# Patient Record
Sex: Female | Born: 1975 | Race: White | Hispanic: No | Smoking: Current every day smoker
Health system: Southern US, Community
[De-identification: ages and names within clinical notes are randomized; demographics above are authoritative.]

## PROBLEM LIST (undated history)

## (undated) DIAGNOSIS — M869 Osteomyelitis, unspecified: Secondary | ICD-10-CM

## (undated) DIAGNOSIS — G8929 Other chronic pain: Secondary | ICD-10-CM

## (undated) DIAGNOSIS — F32A Depression, unspecified: Secondary | ICD-10-CM

## (undated) DIAGNOSIS — F419 Anxiety disorder, unspecified: Secondary | ICD-10-CM

## (undated) DIAGNOSIS — F319 Bipolar disorder, unspecified: Secondary | ICD-10-CM

## (undated) DIAGNOSIS — J45909 Unspecified asthma, uncomplicated: Secondary | ICD-10-CM

## (undated) DIAGNOSIS — G5791 Unspecified mononeuropathy of right lower limb: Secondary | ICD-10-CM

## (undated) DIAGNOSIS — A4902 Methicillin resistant Staphylococcus aureus infection, unspecified site: Secondary | ICD-10-CM

## (undated) DIAGNOSIS — F329 Major depressive disorder, single episode, unspecified: Secondary | ICD-10-CM

## (undated) DIAGNOSIS — G43909 Migraine, unspecified, not intractable, without status migrainosus: Secondary | ICD-10-CM

## (undated) DIAGNOSIS — B999 Unspecified infectious disease: Secondary | ICD-10-CM

## (undated) DIAGNOSIS — M549 Dorsalgia, unspecified: Secondary | ICD-10-CM

## (undated) DIAGNOSIS — Z86718 Personal history of other venous thrombosis and embolism: Secondary | ICD-10-CM

## (undated) DIAGNOSIS — D649 Anemia, unspecified: Secondary | ICD-10-CM

## (undated) DIAGNOSIS — M1990 Unspecified osteoarthritis, unspecified site: Secondary | ICD-10-CM

## (undated) DIAGNOSIS — K219 Gastro-esophageal reflux disease without esophagitis: Secondary | ICD-10-CM

## (undated) HISTORY — PX: ANKLE FRACTURE SURGERY: SHX122

## (undated) HISTORY — PX: DILATION AND CURETTAGE OF UTERUS: SHX78

## (undated) HISTORY — PX: FRACTURE SURGERY: SHX138

## (undated) HISTORY — PX: TUBAL LIGATION: SHX77

---

## 1998-09-08 ENCOUNTER — Inpatient Hospital Stay (HOSPITAL_COMMUNITY): Admission: AD | Admit: 1998-09-08 | Discharge: 1998-09-08 | Payer: Self-pay | Admitting: Obstetrics & Gynecology

## 1998-09-08 ENCOUNTER — Encounter (INDEPENDENT_AMBULATORY_CARE_PROVIDER_SITE_OTHER): Payer: Self-pay

## 1998-10-10 ENCOUNTER — Encounter: Payer: Self-pay | Admitting: Emergency Medicine

## 1998-10-10 ENCOUNTER — Inpatient Hospital Stay (HOSPITAL_COMMUNITY): Admission: AD | Admit: 1998-10-10 | Discharge: 1998-10-12 | Payer: Self-pay | Admitting: Obstetrics & Gynecology

## 1998-10-10 ENCOUNTER — Emergency Department (HOSPITAL_COMMUNITY): Admission: EM | Admit: 1998-10-10 | Discharge: 1998-10-10 | Payer: Self-pay | Admitting: Emergency Medicine

## 1998-11-17 ENCOUNTER — Inpatient Hospital Stay (HOSPITAL_COMMUNITY): Admission: AD | Admit: 1998-11-17 | Discharge: 1998-11-17 | Payer: Self-pay | Admitting: Obstetrics & Gynecology

## 1998-11-17 ENCOUNTER — Encounter: Payer: Self-pay | Admitting: Obstetrics & Gynecology

## 1998-12-01 ENCOUNTER — Inpatient Hospital Stay (HOSPITAL_COMMUNITY): Admission: AD | Admit: 1998-12-01 | Discharge: 1998-12-01 | Payer: Self-pay | Admitting: Obstetrics and Gynecology

## 1998-12-02 ENCOUNTER — Observation Stay (HOSPITAL_COMMUNITY): Admission: AD | Admit: 1998-12-02 | Discharge: 1998-12-03 | Payer: Self-pay | Admitting: Obstetrics

## 1998-12-03 ENCOUNTER — Encounter: Payer: Self-pay | Admitting: Obstetrics

## 1998-12-10 ENCOUNTER — Encounter (INDEPENDENT_AMBULATORY_CARE_PROVIDER_SITE_OTHER): Payer: Self-pay

## 1998-12-10 ENCOUNTER — Ambulatory Visit (HOSPITAL_COMMUNITY): Admission: AD | Admit: 1998-12-10 | Discharge: 1998-12-10 | Payer: Self-pay | Admitting: Obstetrics & Gynecology

## 1999-05-29 ENCOUNTER — Encounter: Payer: Self-pay | Admitting: Emergency Medicine

## 1999-05-29 ENCOUNTER — Emergency Department (HOSPITAL_COMMUNITY): Admission: EM | Admit: 1999-05-29 | Discharge: 1999-05-29 | Payer: Self-pay | Admitting: Emergency Medicine

## 2002-01-20 ENCOUNTER — Emergency Department (HOSPITAL_COMMUNITY): Admission: EM | Admit: 2002-01-20 | Discharge: 2002-01-20 | Payer: Self-pay | Admitting: Emergency Medicine

## 2002-01-20 ENCOUNTER — Encounter: Payer: Self-pay | Admitting: Emergency Medicine

## 2002-03-19 ENCOUNTER — Inpatient Hospital Stay (HOSPITAL_COMMUNITY): Admission: AD | Admit: 2002-03-19 | Discharge: 2002-03-19 | Payer: Self-pay | Admitting: *Deleted

## 2002-05-05 ENCOUNTER — Inpatient Hospital Stay (HOSPITAL_COMMUNITY): Admission: AD | Admit: 2002-05-05 | Discharge: 2002-05-05 | Payer: Self-pay | Admitting: *Deleted

## 2002-05-05 ENCOUNTER — Encounter: Payer: Self-pay | Admitting: *Deleted

## 2002-05-06 ENCOUNTER — Inpatient Hospital Stay (HOSPITAL_COMMUNITY): Admission: AD | Admit: 2002-05-06 | Discharge: 2002-05-06 | Payer: Self-pay | Admitting: Obstetrics and Gynecology

## 2002-05-10 ENCOUNTER — Inpatient Hospital Stay (HOSPITAL_COMMUNITY): Admission: AD | Admit: 2002-05-10 | Discharge: 2002-05-10 | Payer: Self-pay | Admitting: Obstetrics and Gynecology

## 2002-05-12 ENCOUNTER — Encounter: Admission: RE | Admit: 2002-05-12 | Discharge: 2002-05-12 | Payer: Self-pay | Admitting: *Deleted

## 2002-05-13 ENCOUNTER — Inpatient Hospital Stay (HOSPITAL_COMMUNITY): Admission: AD | Admit: 2002-05-13 | Discharge: 2002-05-15 | Payer: Self-pay | Admitting: Obstetrics and Gynecology

## 2006-06-08 ENCOUNTER — Inpatient Hospital Stay (HOSPITAL_COMMUNITY): Admission: EM | Admit: 2006-06-08 | Discharge: 2006-06-11 | Payer: Self-pay | Admitting: Internal Medicine

## 2006-11-03 ENCOUNTER — Emergency Department (HOSPITAL_COMMUNITY): Admission: EM | Admit: 2006-11-03 | Discharge: 2006-11-03 | Payer: Self-pay | Admitting: Emergency Medicine

## 2007-02-04 ENCOUNTER — Emergency Department (HOSPITAL_COMMUNITY): Admission: EM | Admit: 2007-02-04 | Discharge: 2007-02-04 | Payer: Self-pay | Admitting: Emergency Medicine

## 2007-08-14 ENCOUNTER — Inpatient Hospital Stay (HOSPITAL_COMMUNITY): Admission: EM | Admit: 2007-08-14 | Discharge: 2007-08-16 | Payer: Self-pay | Admitting: Emergency Medicine

## 2007-09-12 ENCOUNTER — Emergency Department (HOSPITAL_COMMUNITY): Admission: EM | Admit: 2007-09-12 | Discharge: 2007-09-12 | Payer: Self-pay | Admitting: Emergency Medicine

## 2007-12-10 ENCOUNTER — Inpatient Hospital Stay (HOSPITAL_COMMUNITY): Admission: AC | Admit: 2007-12-10 | Discharge: 2007-12-11 | Payer: Self-pay

## 2009-03-22 ENCOUNTER — Emergency Department (HOSPITAL_COMMUNITY): Admission: EM | Admit: 2009-03-22 | Discharge: 2009-03-22 | Payer: Self-pay | Admitting: Emergency Medicine

## 2009-03-24 ENCOUNTER — Emergency Department (HOSPITAL_COMMUNITY): Admission: EM | Admit: 2009-03-24 | Discharge: 2009-03-25 | Payer: Self-pay | Admitting: Emergency Medicine

## 2009-04-30 ENCOUNTER — Emergency Department (HOSPITAL_COMMUNITY): Admission: EM | Admit: 2009-04-30 | Discharge: 2009-04-30 | Payer: Self-pay | Admitting: Emergency Medicine

## 2009-08-09 ENCOUNTER — Emergency Department (HOSPITAL_COMMUNITY): Admission: EM | Admit: 2009-08-09 | Discharge: 2009-08-10 | Payer: Self-pay | Admitting: Emergency Medicine

## 2009-12-23 ENCOUNTER — Emergency Department (HOSPITAL_COMMUNITY): Admission: EM | Admit: 2009-12-23 | Discharge: 2009-12-24 | Payer: Self-pay | Admitting: Emergency Medicine

## 2010-05-10 LAB — CBC
HCT: 39.4 % (ref 36.0–46.0)
Hemoglobin: 12.5 g/dL (ref 12.0–15.0)
MCH: 24.7 pg — ABNORMAL LOW (ref 26.0–34.0)
MCV: 77.7 fL — ABNORMAL LOW (ref 78.0–100.0)
RBC: 5.07 MIL/uL (ref 3.87–5.11)
WBC: 19.1 10*3/uL — ABNORMAL HIGH (ref 4.0–10.5)

## 2010-05-10 LAB — DIFFERENTIAL
Basophils Absolute: 0 10*3/uL (ref 0.0–0.1)
Basophils Relative: 0 % (ref 0–1)
Monocytes Absolute: 1 10*3/uL (ref 0.1–1.0)
Neutro Abs: 16.6 10*3/uL — ABNORMAL HIGH (ref 1.7–7.7)
Neutrophils Relative %: 87 % — ABNORMAL HIGH (ref 43–77)

## 2010-05-10 LAB — COMPREHENSIVE METABOLIC PANEL
Albumin: 4.4 g/dL (ref 3.5–5.2)
Alkaline Phosphatase: 49 U/L (ref 39–117)
BUN: 9 mg/dL (ref 6–23)
CO2: 19 mEq/L (ref 19–32)
Chloride: 110 mEq/L (ref 96–112)
GFR calc non Af Amer: >60 mL/min (ref >60)
Glucose, Bld: 101 mg/dL — ABNORMAL HIGH (ref 70–99)
Potassium: 4.3 mEq/L (ref 3.5–5.1)
Total Bilirubin: 0.6 mg/dL (ref 0.3–1.2)

## 2010-05-10 LAB — URINALYSIS, ROUTINE W REFLEX MICROSCOPIC
Hgb urine dipstick: NEGATIVE
Protein, ur: NEGATIVE mg/dL
Urobilinogen, UA: 0.2 mg/dL (ref 0.0–1.0)

## 2010-05-10 LAB — SALICYLATE LEVEL: Salicylate Lvl: <4 mg/dL (ref 2.8–20.0)

## 2010-05-10 LAB — RAPID URINE DRUG SCREEN, HOSP PERFORMED
Amphetamines: NOT DETECTED
Barbiturates: NOT DETECTED
Tetrahydrocannabinol: NOT DETECTED

## 2010-05-10 LAB — POCT PREGNANCY, URINE: Preg Test, Ur: NEGATIVE

## 2010-05-14 LAB — POCT I-STAT, CHEM 8
Calcium, Ion: 1.16 mmol/L (ref 1.12–1.32)
Chloride: 105 mEq/L (ref 96–112)
HCT: 41 % (ref 36.0–46.0)
TCO2: 29 mmol/L (ref 0–100)

## 2010-05-14 LAB — ETHANOL: Alcohol, Ethyl (B): <5 mg/dL (ref 0–10)

## 2010-05-14 LAB — RAPID URINE DRUG SCREEN, HOSP PERFORMED
Opiates: NOT DETECTED
Tetrahydrocannabinol: POSITIVE — AB

## 2010-05-15 LAB — CBC
HCT: 36.1 % (ref 36.0–46.0)
Hemoglobin: 11.8 g/dL — ABNORMAL LOW (ref 12.0–15.0)
RBC: 4.39 MIL/uL (ref 3.87–5.11)
RDW: 17.5 % — ABNORMAL HIGH (ref 11.5–15.5)

## 2010-05-15 LAB — URINALYSIS, ROUTINE W REFLEX MICROSCOPIC
Glucose, UA: NEGATIVE mg/dL
Ketones, ur: 15 mg/dL — AB
Nitrite: POSITIVE — AB
Protein, ur: 100 mg/dL — AB
Specific Gravity, Urine: 1.017 (ref 1.005–1.030)
Urobilinogen, UA: >8 mg/dL — ABNORMAL HIGH (ref 0.0–1.0)
pH: 6 (ref 5.0–8.0)

## 2010-05-15 LAB — POCT PREGNANCY, URINE: Preg Test, Ur: NEGATIVE

## 2010-05-15 LAB — COMPREHENSIVE METABOLIC PANEL WITH GFR
ALT: 21 U/L (ref 0–35)
AST: 28 U/L (ref 0–37)
Albumin: 3.7 g/dL (ref 3.5–5.2)
Alkaline Phosphatase: 76 U/L (ref 39–117)
BUN: 7 mg/dL (ref 6–23)
CO2: 28 meq/L (ref 19–32)
Calcium: 8.9 mg/dL (ref 8.4–10.5)
Chloride: 100 meq/L (ref 96–112)
Creatinine, Ser: 0.92 mg/dL (ref 0.4–1.2)
GFR calc non Af Amer: >60 mL/min
Glucose, Bld: 90 mg/dL (ref 70–99)
Potassium: 3.7 meq/L (ref 3.5–5.1)
Sodium: 137 meq/L (ref 135–145)
Total Bilirubin: 0.8 mg/dL (ref 0.3–1.2)
Total Protein: 7.7 g/dL (ref 6.0–8.3)

## 2010-05-15 LAB — URINE MICROSCOPIC-ADD ON

## 2010-05-15 LAB — DIFFERENTIAL
Basophils Absolute: 0 10*3/uL (ref 0.0–0.1)
Basophils Relative: 1 % (ref 0–1)
Eosinophils Absolute: 0 10*3/uL (ref 0.0–0.7)
Neutro Abs: 4.1 10*3/uL (ref 1.7–7.7)
Neutrophils Relative %: 70 % (ref 43–77)

## 2010-05-15 LAB — LIPASE, BLOOD: Lipase: 18 U/L (ref 11–59)

## 2010-06-15 ENCOUNTER — Emergency Department (HOSPITAL_COMMUNITY)
Admission: EM | Admit: 2010-06-15 | Discharge: 2010-06-15 | Discharge status: Against Medical Advice | Payer: Self-pay | Attending: Emergency Medicine | Admitting: Emergency Medicine

## 2010-06-15 DIAGNOSIS — R221 Localized swelling, mass and lump, neck: Secondary | ICD-10-CM | POA: Insufficient documentation

## 2010-06-15 DIAGNOSIS — R220 Localized swelling, mass and lump, head: Secondary | ICD-10-CM | POA: Insufficient documentation

## 2010-06-15 DIAGNOSIS — H9209 Otalgia, unspecified ear: Secondary | ICD-10-CM | POA: Insufficient documentation

## 2010-06-15 DIAGNOSIS — K089 Disorder of teeth and supporting structures, unspecified: Secondary | ICD-10-CM | POA: Insufficient documentation

## 2010-07-11 NOTE — Discharge Summary (Signed)
NAMECELE, MOTE NO.:  [id number]   MEDICAL RECORD NO.:  [id number]          PATIENT TYPE:  INP   LOCATION:  5531                         FACILITY:  [facility]   PHYSICIAN:  Isidor Holts, M.D.  DATE OF BIRTH:  07-Jan-1976   DATE OF ADMISSION:  12/09/2007  DATE OF DISCHARGE:  12/11/2007                               DISCHARGE SUMMARY   PMD:  Unassigned.   DISCHARGE DIAGNOSES:  1. Altered mental status secondary to polysubstance abuse.  2. Acute alcoholic intoxication.  3. Polysubstance abuse, i.e., marijuana, cocaine, Klonopin.   DISCHARGE MEDICATIONS:  None.   PROCEDURES.:  1. Chest x-ray dated December 09, 2007, this showed no acute      cardiopulmonary disease.  2. Head CT scan dated December 10, 2007, this showed chronic paranasal      sinusitis.  No acute intracranial findings.   CONSULTATIONS:  None.   ADMISSION HISTORY:  As in H and P notes of October 14,2 009, dictated by  Dr. Midge Minium.  However, in brief, this is a 35 year old female,  with history of anxiety/panic disorder, brought to the emergency  department at [facility] after being found acutely intoxicated  with altered mental status, on the road.  On initial evaluation in the  emergency department, she was found to be somnolent, and agitated when  roused.  She was therefore admitted for further evaluation,  investigation, and management.   CLINICAL COURSE:  1. Altered mental status.  This was secondary to polysubstance abuse.      Patient on detailed questioning when fully alert and communicative      in the a.m. of December 11, 2007, admits to having taken some      tablets of Klonopin which she says she obtained from an      acquaintance, went to shoot some pool, did some crack cocaine, and      drank a lot of alcohol.  The next thing she knew, she was at the      hospital.  Urine drug screen was positive for cocaine,      benzodiazepines, and marijuana.  Alcohol level  was 11.  Chest x-ray      was unremarkable for acute disease.  Head CT scan was unremarkable      for acute intracranial pathology, although it showed sinus disease.      Patient was initially placed in the intensive care unit, managed      with supportive care including intravenous fluid hydration, vitamin      supplementation, telemetric monitoring, oxygen supplementation, and      intermittent doses of Ativan for agitation.  Initially, she did      require restraints, however, by p.m. of December 10, 2007, she was      calm, alert, eating.  There were no further issues.  Although she      had been placed on one-on-one sitter at the time of presentation,      it soon became clear after detailed discussion with her, that she      had no suicidal ideation.  Psychiatric input was therefore deemed  unnecessary.   1. Polysubstance abuse.  As mentioned above, patient's urine drug      screen was positive for marijuana, cocaine, and benzodiazepine.      Patient has been counseled appropriately.   1. Acute alcoholic intoxication.  This had resolved by a.m. of December 11, 2007.   DISPOSITION:  The patient was on December 11, 2007, considered clinically  stable for discharge.  She was therefore discharged accordingly.   DIET:  No restrictions.   ACTIVITY:  No restrictions.   FOLLOWUP INSTRUCTIONS:  Patient is to follow up routinely with her  primary M.D.   Kendell Bane MEDICATIONS:  None.      Isidor Holts, M.D.  Electronically Signed     CO/MEDQ  D:  12/11/2007  T:  12/11/2007  Job:  [id number]

## 2010-07-11 NOTE — Op Note (Signed)
Monique [name], Monique [name] NO.:  [id number]   MEDICAL RECORD NO.:  [id number]          PATIENT TYPE:  INP   LOCATION:  1531                         FACILITY:  [facility]   PHYSICIAN:  Dionne Ano. Gramig III, M.D.DATE OF BIRTH:  [birth year]   DATE OF PROCEDURE:  08/14/2007  DATE OF DISCHARGE:                               OPERATIVE REPORT   PREOPERATIVE DIAGNOSIS:  Deep abscess right upper extremity with  ascending cellulitis, lymphangitis.   POSTOPERATIVE DIAGNOSIS:  Deep abscess right upper extremity with  ascending cellulitis, lymphangitis.   PROCEDURE:  I&D deep abscess right hand and index finger.   SURGEON:  Dominica Severin, M.D.   ASSISTANT:  None.   COMPLICATIONS:  None.   ANESTHESIA:  General.   DRAINS:  Two separate drains at 2 separate incision sites.   ESTIMATED BLOOD LOSS:  Minimal.   INDICATIONS FOR PROCEDURE:  This patient is 35 year old female who  presents with the mentioned diagnosis.  I have counseled her in regards  to risk and benefits of surgery including risk of infection, bleeding  anesthesia, damage to normal structures and failure of surgery to  accomplish its intended goals of relieving symptoms and restoring  function.  With this in mind, she desires to proceed.  All questions  have been encouraged and answered preoperatively.   OPERATIVE PROCEDURE:  The patient was seen on myself and anesthesia,  taken to the operative suite, and underwent smooth induction of general  anesthesia.  A time out was called, and the patient was thoroughly  counseled, consented and permitted.  Under general anesthetic, the  patient was prepped and draped.  Following this, incision was made about  the index finger.  A large amount of deep purulence was removed and  evacuated.  A second incision about the PIP region distally on the  dorsal aspect of finger was also made.  Counterincision and through-and-  through irrigation was then set up.  The  through-and-through irrigation  was accomplished without difficulty utilizing 3 liters of saline.  Following this, the wound was aggressively debrided of any nonviable  tissue and packed with Iodoform gauze.   The patient had aerobic and anaerobic cultures taken.  There no  complicating features with her surgery, and all sponge, needle and  instrument counts were reported as correct.  She was dressed with  Neosporin gauze, Kerlix, Webril and Ace wrap.   She will be admitted for IV antibiotics in form of vancomycin and Zosyn.  I would plan for daily therapy for whirlpool/water pick, so that we can  try and get her wound and parameters back to a quiescent state of  affairs.  I have discussed relevant issues with the patient, and she is  thoroughly aware of the infection and our plans for admission,  elevation, IV antibiotics, pain management, etc.      Dionne Ano. Everlene Other, M.D.     [name]  D:  08/14/2007  T:  08/14/2007  Job:  [id number]

## 2010-07-11 NOTE — H&P (Signed)
NAME:  ED, [id number]                    ACCOUNT NO.:  [id number]   MEDICAL RECORD NO.:  [id number]          PATIENT TYPE:  INP   LOCATION:  3103                         FACILITY:  [facility]   PHYSICIAN:  Eduard Clos, MDDATE OF BIRTH:  [birth year]09/2007   DATE OF ADMISSION:  12/10/2007  DATE OF DISCHARGE:                              HISTORY & PHYSICAL   CHIEF COMPLAINT:  Altered mental status.   HISTORY OF PRESENT ILLNESS:  Patient who looks around 42-35 years old  who was brought into the ER.  The patient was found in altered mental  status in the road.  In the ER, the patient had a tox screen which was  positive for benzodiazepines, cocaine, marijuana.  She is being admitted  for further management of her altered mental status probably from  polysubstance abuse intoxication.  The patient presently is arousable,  is agitated and falls asleep soon.  Is not able to be giving any good  history, and will be admitted for further observation with suicide  precautions.  No further history is available, and the patient is non-  communicative.   PAST MEDICAL HISTORY:  Not known.   PAST SURGICAL HISTORY:  Not known.   MEDICATIONS PRIOR TO ADMISSION:  Not known.   SOCIAL HISTORY:  Not known.   FAMILY HISTORY:  Not known.   ALLERGIES:  Not known.   Previous history not possible, as the patient is non-communicative.   PHYSICAL EXAMINATION:  GENERAL:  The patient examined at bedside. Easily  arousable, but falls back sleep again.  VITAL SIGNS:  Blood pressure 101/49, pulse 70 per minute, temperature  97.2, respirations 18 per minute, oxygen saturation 100%.  HEENT:  There are no obvious external injuries. .  No facial asymmetry.  CHEST:  Bilaterally clear.  No rhonchi, no crepitation.  HEART:  S1 and S2 heard.  ABDOMEN:  Soft, nontender.  Bowel sounds heard.  CNS:  The patient is sleeping.  He is arousable.  Does not communicate.  Gets agitated easily.  Moves all limbs.  EXTREMITIES:   Peripheral pulses felt.  There was an old bullous-like  lesion on the foot.  No edema.   LABORATORY DATA:  CT of the head - nothing acute.  EKG - normal sinus  rhythm.  CBC - WBC of 7.3, hemoglobin 11.3, hematocrit 35.1, platelets  357.  PT/INR of 12.2 and 0.9.  Comprehensive metabolic panel - sodium  142, potassium 3.9, chloride 104, carbon dioxide 29, glucose 88, BUN 12,  creatinine 0.8, total bilirubin 0.6, alkaline phosphatase 58, AST 24,  total protein 7.7, albumin 3.7, calcium 9.4, amylase 59, lipase 24.  less than 10.  Salicylate less than 4.  Tox screen positive for cocaine,  benzodiazepines, marijuana.  Alcohol level is 11.  Urinalysis is  negative for nitrites, trace leukocytes.  WBCs 0.  Cultures pending for  urine.  Chest x-ray revealed nothing acute.   ASSESSMENT:  1. Altered mental status probably from polysubstance overdose or drug      intoxication.  2. Anemia.   PLAN:  Plan to admit  the patient to step-down for closer observation.  Place patient on one-to-one sitter for possible suicide precautions.  Will place the patient on p.r.n. Ativan .  Once the patient is more  alert and awake, get detailed history to see if the patient was  suicidal, and further recommendations as condition evolves.      Eduard Clos, MD  Electronically Signed     ANK/MEDQ  D:  12/10/2007  T:  12/10/2007  Job:  [id number]

## 2010-07-14 NOTE — Discharge Summary (Signed)
Monique [name], MEAS NO.:  [id number]   MEDICAL RECORD NO.:  [id number]          PATIENT TYPE:  INP   LOCATION:  1621                         FACILITY:  [facility]   PHYSICIAN:  Monique [name], MDDATE OF BIRTH:  1975-09-23   DATE OF ADMISSION:  06/08/2006  DATE OF DISCHARGE:                               DISCHARGE SUMMARY   DISCHARGE DIAGNOSES:  1. Right facial cellulitis.  2. Hypertension.  3. Substance abuse.  4. Anxiety.  5. Panic disorder.  6. Tobacco abuse.  7. Anemia.   HOSPITAL COURSE:  This 35 year old lady presented to the emergency room  with fever, high-grade, of two days' duration, associated with chills,  increasing right facial swelling, discharging pus, inability to open her  mouth ,bc 11,000, she had very large swelling encompassing the right  jaw, tender and red.  A CT scan of the face did not show any abscess  collection.  The patient was started on IV antibiotics of vancomycin and  Unasyn.  The case was discussed with the maxillofacial/ENT surgeon on  call, Dr. Ezzard [name], who agrees with the IV antibiotics and monitoring.  The patient's swelling and pain are much, much reduced.  She had  hypokalemia,  which was supplemented on admission.  However, with  clinical improvement the patient has been becoming increasingly more  disruptive.  She actually admitted that the name she gave on admission  was fake.  A friend who came also  reported the patient is a drug abuser  apart from the marijuana, and also takes narcotics.  The patient has  been increasingly demanding for more opiates despite no objective sign  of pain being observed by the nursing staff.  Blood pressure was 138/70  and she was started on a beta blocker, which is controlling the blood  pressure.   DISCHARGE CONDITION:  Stable.   Diet should be low-sodium, heart-healthy.   ACTIVITY:  To increase activity slowly.   Blood culture was normal.  Wound culture is pending.    Being discharged home to follow up with her primary care physician, Dr.  Janine [name], in a week, who is going to follow the blood and wound culture  results as an outpatient.  She is to follow up with Dr. Jeanie [name],  psychiatrist, as an outpatient in 3-5 days.  The patient is scheduled  fordetox in the next 1-2 weeks.  She has been counseled on substance  abuse cessation.   DISPOSITION:  Home.   DISCHARGE MEDICATIONS:  1. ampicillin  500 mg q.6h. at home.  2. Clindamycin 600 mg q.8h.  3. Metoprolol 25 mg daily.  4. Klonopin 1 mg b.i.d.  5. Percocet 5/325 mg q.6h. p.r.n.  6. Motrin 800 mg q.8h. p.r.n.   PHYSICAL EXAMINATION:  GENERAL:  On examination today, she is  not in  acute distress.  VITAL SIGNS:  The temperature is 98, pulse is 78, respiratory rate is  16, blood pressure is 110/70.  HEENT:  Right facial erythema, tenderness much reduced. .  Oropharynx  and nasopharynx are clear.  NECK:  Submandibular lymphadenopathy, minimally tender.  CHEST:  Clinically clear.  ABDOMEN:  Benign.  NEUROLOGIC:  She is alert and oriented x3.  EXTREMITIES:  Peripheral pulses present.  No pedal edema.   LABORATORY DATA:  WBC is 9, hematocrit 30, platelet count 282.  Glucose  is 90.  Sodium 140, potassium 3.9, chloride 109, bicarbonate 27, BUN is  9, creatinine is 0.4.      Monique Saxon, MD  Electronically Signed     MIO/MEDQ  D:  06/11/2006  T:  06/11/2006  Job:  [id number]   cc:   Monique [name], M.D.

## 2010-07-14 NOTE — H&P (Signed)
NAMENATURE, KUEKER NO.:  [id number]   MEDICAL RECORD NO.:  [id number]          PATIENT TYPE:  INP   LOCATION:  1621                         [facility]   PHYSICIAN:  Herbie Saxon, MDDATE OF BIRTH:  [birth year]   DATE OF ADMISSION:  06/08/2006  DATE OF DISCHARGE:                              HISTORY & PHYSICAL   PRIMARY CARE PHYSICIAN:  Unassigned.   PRESENTING COMPLAINT:  Facial skin rash 3 days, fever 2 days.   HISTORY OF PRESENTING COMPLAINT:  This is a 35 year old lady who was  quite well until 3 days ago when she noticed increasing erythematous  skin rash on her face.  This rapidly spread with notable swelling of the  right jaw which got increasingly worse today, necessitating presenting  to the emergency room.  She did have a low grade fever, inability to  open her mouth and talk properly and also air irritation.  She denies  any sore throat.  She has a dry cough.  There is no shortness of breath.  There is no chest pain, no symptoms referable to the genitourinary or  gastrointestinal systems.  No joint swelling.  Last menstrual period 2  weeks ago.   PAST MEDICAL HISTORY:  Anxiety and panic disorder.   MEDICATIONS:  Klonopin 1 tab b.i.d.   ALLERGIES:  No known drug allergies.   PAST SURGICAL HISTORY:  Bilateral tubal ligation in 2006.   FAMILY HISTORY:  Her mother has diabetes.   SOCIAL HISTORY:  She smoked 1 pack per day for more than 18 years.  She  is a social drinker.  She admits to smoking marijuana.  The patient last  used 2 weeks ago.   REVIEW OF SYSTEMS:  Twelve systems reviewed.  Positive as above.   PHYSICAL EXAMINATION:  GENERAL:  She is a young lady in no acute  distress.  Note that the right facial pain is 8/10, throbbing,  nonradiating.  VITAL SIGNS:  Temperature 99.3, pulse 87, respirations 20, blood  pressure 125/84.  HEENT:  Pupils equal, reactive to light and accommodation.  She has a  fluctuant tender  swelling on mostly the right side of the face with  tenderness of mandible lymphadenopathy.  She also has trismus.  Neck  otherwise supple.  Extraocular muscles intact.  CHEST:  Clinically clear.  Heart sounds 1 and 2 regular.  ABDOMEN:  Soft, nontender, no organomegaly.  NEUROLOGIC:  She is alert and oriented x3.  EXTREMITIES:  Peripheral pulses present.  No pedal edema.   AVAILABLE LABORATORY DATA:  WBC 11.9, hematocrit 32.8, platelet count is  376.  Sodium 140, potassium 3.3, chloride 104, bicarbonate 29, glucose  104, BUN 9, creatinine 0.5.   ASSESSMENT:  1. Right facial abscess.  2. History of tobacco and marijuana use.  3. History of panic attacks and anxiety disorder.   The patient is to be admitted to the medical floor.  We are going to  start IV vancomycin and Unasyn.  Pain medication with IV Dilaudid 2 mg  q.6h. p.r.n., Toradol 30 mg IV q.12h. p.r.n., warm compresses.  Discussed case with Dr. Ezzard Standing.  ENT surgeon will follow up for possible  I&D of the abscess if not resolving with antibiotics.  We will send a CT  of the face to assess the collection.  Vital signs every shift.  She  will be admitted to [facility].  Activity, bed rest.  NPO  for now.  IV fluid D5 half normal at 80 mL/hr, 40 mEq KCl and a fourth  liter of IV fluids x1 liter.  We will check a urinalysis, blood culture  x2, wound culture.  Phenergan 12.5 mg IV q.6h. p.r.n., Protonix 40 mg IV  daily.  Vicodin 5/500 1 q.4-6h. p.r.n.  Tobacco cessation counseling,  substance abuse counseling, IV Unasyn 3 g q.6h., IV vancomycin 1 g  q.12h., Ativan 2 mg IV q.6h. p.r.n.  Nicotine patch 21 mg daily, and we  will check a CBC and CMP in the morning.  Time is 40 minutes.      Herbie Saxon, MD  Electronically Signed     MIO/MEDQ  D:  06/08/2006  T:  06/08/2006  Job:  [id number]

## 2010-11-07 ENCOUNTER — Observation Stay (HOSPITAL_COMMUNITY)
Admission: EM | Admit: 2010-11-07 | Discharge: 2010-11-08 | Disposition: A | Discharge status: Home / Self Care | Payer: Self-pay | Attending: Emergency Medicine | Admitting: Emergency Medicine

## 2010-11-07 ENCOUNTER — Emergency Department (HOSPITAL_COMMUNITY): Payer: Self-pay

## 2010-11-07 DIAGNOSIS — F17200 Nicotine dependence, unspecified, uncomplicated: Secondary | ICD-10-CM | POA: Insufficient documentation

## 2010-11-07 DIAGNOSIS — K047 Periapical abscess without sinus: Secondary | ICD-10-CM | POA: Insufficient documentation

## 2010-11-07 DIAGNOSIS — R079 Chest pain, unspecified: Principal | ICD-10-CM | POA: Insufficient documentation

## 2010-11-07 LAB — POCT I-STAT TROPONIN I: Troponin i, poc: 0 ng/mL (ref 0.00–0.08)

## 2010-11-07 LAB — POCT I-STAT, CHEM 8
Chloride: 103 mEq/L (ref 96–112)
Creatinine, Ser: 0.8 mg/dL (ref 0.50–1.10)
Glucose, Bld: 119 mg/dL — ABNORMAL HIGH (ref 70–99)
HCT: 46 % (ref 36.0–46.0)
Hemoglobin: 15.6 g/dL — ABNORMAL HIGH (ref 12.0–15.0)
Potassium: 3.6 mEq/L (ref 3.5–5.1)
Sodium: 137 mEq/L (ref 135–145)

## 2010-11-08 DIAGNOSIS — R072 Precordial pain: Secondary | ICD-10-CM

## 2010-11-23 LAB — BASIC METABOLIC PANEL
BUN: 10
CO2: 30
Calcium: 9.4
Chloride: 103
Creatinine, Ser: 0.62
GFR calc Af Amer: >60

## 2010-11-23 LAB — DIFFERENTIAL
Basophils Absolute: 0
Eosinophils Absolute: 0.1
Lymphs Abs: 1.5
Monocytes Absolute: 0.6
Neutrophils Relative %: 66

## 2010-11-23 LAB — ANAEROBIC CULTURE

## 2010-11-23 LAB — CULTURE, ROUTINE-ABSCESS

## 2010-11-23 LAB — PREGNANCY, URINE: Preg Test, Ur: NEGATIVE

## 2010-11-23 LAB — CBC
MCHC: 31.2
MCV: 71.9 — ABNORMAL LOW
Platelets: 472 — ABNORMAL HIGH
RBC: 4.68
RDW: 17.4 — ABNORMAL HIGH

## 2010-11-23 LAB — RAPID URINE DRUG SCREEN, HOSP PERFORMED
Barbiturates: NOT DETECTED
Benzodiazepines: NOT DETECTED

## 2010-11-28 LAB — CARDIAC PANEL(CRET KIN+CKTOT+MB+TROPI)
CK, MB: 0.4
CK, MB: 0.5
Relative Index: INVALID
Relative Index: INVALID
Total CK: 34
Total CK: 38
Troponin I: 0.01

## 2010-11-28 LAB — SALICYLATE LEVEL: Salicylate Lvl: <4

## 2010-11-28 LAB — BASIC METABOLIC PANEL
BUN: 7
Calcium: 8.7
GFR calc non Af Amer: >60
Glucose, Bld: 147 — ABNORMAL HIGH
Potassium: 3.5
Sodium: 135

## 2010-11-28 LAB — LIPID PANEL
Cholesterol: 157
LDL Cholesterol: 87
Triglycerides: 155 — ABNORMAL HIGH
VLDL: 31

## 2010-11-28 LAB — COMPREHENSIVE METABOLIC PANEL
AST: 18
AST: 24
Albumin: 3.1 — ABNORMAL LOW
BUN: 13
Calcium: 8.6
Chloride: 104
Creatinine, Ser: 0.67
Creatinine, Ser: 0.85
Glucose, Bld: 88
Potassium: 3.9
Sodium: 142
Total Bilirubin: 0.5

## 2010-11-28 LAB — DIFFERENTIAL
Basophils Relative: 1
Eosinophils Absolute: 0.1
Lymphocytes Relative: 39
Lymphs Abs: 2.8
Monocytes Relative: 11
Neutro Abs: 3.4

## 2010-11-28 LAB — RAPID URINE DRUG SCREEN, HOSP PERFORMED
Amphetamines: NOT DETECTED
Barbiturates: NOT DETECTED
Benzodiazepines: POSITIVE — AB
Cocaine: POSITIVE — AB
Tetrahydrocannabinol: POSITIVE — AB

## 2010-11-28 LAB — URINE CULTURE: Culture: NO GROWTH

## 2010-11-28 LAB — CBC
Hemoglobin: 11.3 — ABNORMAL LOW
MCV: 74.8 — ABNORMAL LOW
Platelets: 283
RDW: 17.8 — ABNORMAL HIGH
RDW: 18 — ABNORMAL HIGH
WBC: 6

## 2010-11-28 LAB — AMYLASE: Amylase: 59

## 2010-11-28 LAB — URINALYSIS, ROUTINE W REFLEX MICROSCOPIC
Hgb urine dipstick: NEGATIVE
Nitrite: NEGATIVE
Protein, ur: NEGATIVE

## 2010-11-28 LAB — LIPASE, BLOOD: Lipase: 24

## 2010-11-28 LAB — TRICYCLICS SCREEN, URINE: TCA Scrn: NOT DETECTED

## 2010-11-28 LAB — URINE MICROSCOPIC-ADD ON

## 2010-11-28 LAB — GLUCOSE, CAPILLARY

## 2010-11-28 LAB — CK: Total CK: 59

## 2010-11-28 LAB — ETHANOL: Alcohol, Ethyl (B): 11 — ABNORMAL HIGH

## 2010-11-28 LAB — ACETAMINOPHEN LEVEL: Acetaminophen (Tylenol), Serum: <10 — ABNORMAL LOW

## 2010-12-04 LAB — CULTURE, ROUTINE-ABSCESS: Gram Stain: NONE SEEN

## 2010-12-08 LAB — DIFFERENTIAL
Basophils Absolute: 0
Basophils Relative: 0
Eosinophils Absolute: 0
Monocytes Relative: 7
Neutro Abs: 7.1
Neutrophils Relative %: 73

## 2010-12-08 LAB — COMPREHENSIVE METABOLIC PANEL
ALT: 26
Alkaline Phosphatase: 52
BUN: 11
CO2: 23
GFR calc non Af Amer: >60
Glucose, Bld: 110 — ABNORMAL HIGH
Potassium: 3.4 — ABNORMAL LOW
Sodium: 141
Total Bilirubin: 0.7

## 2010-12-08 LAB — URINALYSIS, ROUTINE W REFLEX MICROSCOPIC
Glucose, UA: NEGATIVE
Ketones, ur: NEGATIVE
Protein, ur: 30 — AB
Urobilinogen, UA: 0.2

## 2010-12-08 LAB — CBC
MCHC: 32.2
MCV: 74.4 — ABNORMAL LOW
RBC: 4.78
RDW: 17.5 — ABNORMAL HIGH

## 2010-12-08 LAB — RAPID URINE DRUG SCREEN, HOSP PERFORMED
Amphetamines: NOT DETECTED
Opiates: POSITIVE — AB
Tetrahydrocannabinol: NOT DETECTED

## 2010-12-08 LAB — PREGNANCY, URINE: Preg Test, Ur: NEGATIVE

## 2011-03-04 ENCOUNTER — Emergency Department (HOSPITAL_COMMUNITY): Payer: Self-pay

## 2011-03-04 ENCOUNTER — Emergency Department (HOSPITAL_COMMUNITY)
Admission: EM | Admit: 2011-03-04 | Discharge: 2011-03-04 | Disposition: A | Discharge status: Home / Self Care | Payer: Self-pay | Attending: Emergency Medicine | Admitting: Emergency Medicine

## 2011-03-04 ENCOUNTER — Encounter: Payer: Self-pay | Admitting: Emergency Medicine

## 2011-03-04 DIAGNOSIS — M542 Cervicalgia: Secondary | ICD-10-CM | POA: Insufficient documentation

## 2011-03-04 DIAGNOSIS — R51 Headache: Secondary | ICD-10-CM | POA: Insufficient documentation

## 2011-03-04 DIAGNOSIS — IMO0001 Reserved for inherently not codable concepts without codable children: Secondary | ICD-10-CM | POA: Insufficient documentation

## 2011-03-04 DIAGNOSIS — S0990XA Unspecified injury of head, initial encounter: Secondary | ICD-10-CM | POA: Insufficient documentation

## 2011-03-04 DIAGNOSIS — H5789 Other specified disorders of eye and adnexa: Secondary | ICD-10-CM | POA: Insufficient documentation

## 2011-03-04 DIAGNOSIS — H5710 Ocular pain, unspecified eye: Secondary | ICD-10-CM | POA: Insufficient documentation

## 2011-03-04 DIAGNOSIS — S0510XA Contusion of eyeball and orbital tissues, unspecified eye, initial encounter: Secondary | ICD-10-CM | POA: Insufficient documentation

## 2011-03-04 DIAGNOSIS — S0003XA Contusion of scalp, initial encounter: Secondary | ICD-10-CM | POA: Insufficient documentation

## 2011-03-04 MED ORDER — CYCLOBENZAPRINE HCL 10 MG PO TABS: 10.0000 mg | ORAL_TABLET | Freq: Two times a day (BID) | ORAL | Status: AC | PRN

## 2011-03-04 MED ORDER — HYDROCODONE-ACETAMINOPHEN 5-325 MG PO TABS: 1.0000 | ORAL_TABLET | ORAL | Status: AC | PRN

## 2011-03-04 MED ORDER — MORPHINE SULFATE 4 MG/ML IJ SOLN: 4.0000 mg | Freq: Once | INTRAMUSCULAR | Status: DC

## 2011-03-04 MED ORDER — MORPHINE SULFATE 4 MG/ML IJ SOLN
4.0000 mg | Freq: Once | INTRAMUSCULAR | Status: AC
  Administered 2011-03-04: 4 mg via INTRAMUSCULAR

## 2011-03-04 NOTE — ED Notes (Signed)
Visual accuity: R eye 20/20, L eye 20/30, Both eyes 20/20

## 2011-03-04 NOTE — ED Notes (Signed)
Meal tray provided per pt request.  
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
## 2011-03-05 NOTE — ED Provider Notes (Signed)
Medical screening examination/treatment/procedure(s) were performed by non-physician practitioner and as supervising physician I was immediately available for consultation/collaboration.   Dione Booze, MD 03/05/11 2158

## 2011-07-06 ENCOUNTER — Encounter (HOSPITAL_COMMUNITY): Payer: Self-pay | Admitting: Emergency Medicine

## 2011-07-06 ENCOUNTER — Emergency Department (INDEPENDENT_AMBULATORY_CARE_PROVIDER_SITE_OTHER)
Admission: EM | Admit: 2011-07-06 | Discharge: 2011-07-06 | Disposition: A | Discharge status: Home / Self Care | Payer: Self-pay | Source: Home / Self Care | Attending: Emergency Medicine | Admitting: Emergency Medicine

## 2011-07-06 DIAGNOSIS — L03119 Cellulitis of unspecified part of limb: Secondary | ICD-10-CM

## 2011-07-06 DIAGNOSIS — L02419 Cutaneous abscess of limb, unspecified: Secondary | ICD-10-CM

## 2011-07-06 DIAGNOSIS — L02416 Cutaneous abscess of left lower limb: Secondary | ICD-10-CM

## 2011-07-06 HISTORY — DX: Methicillin resistant Staphylococcus aureus infection, unspecified site: A49.02

## 2011-07-06 MED ORDER — HYDROCODONE-ACETAMINOPHEN 5-325 MG PO TABS: 2.0000 | ORAL_TABLET | ORAL | Status: AC | PRN

## 2011-07-06 MED ORDER — SULFAMETHOXAZOLE-TRIMETHOPRIM 800-160 MG PO TABS: 1.0000 | ORAL_TABLET | Freq: Two times a day (BID) | ORAL | Status: AC

## 2011-07-06 MED ORDER — HYDROCODONE-ACETAMINOPHEN 5-325 MG PO TABS
2.0000 | ORAL_TABLET | Freq: Once | ORAL | Status: AC
  Administered 2011-07-06: 2 via ORAL

## 2011-07-06 MED ORDER — HYDROCODONE-ACETAMINOPHEN 5-325 MG PO TABS
ORAL_TABLET | ORAL | Status: AC
  Filled 2011-07-06: qty 2

## 2011-07-06 NOTE — ED Provider Notes (Signed)
History     CSN: [id number]  Arrival date & time 07/06/11  1310   First MD Initiated Contact with Patient 07/06/11 1329      Chief Complaint  Patient presents with  . Abscess    (Consider location/radiation/quality/duration/timing/severity/associated sxs/prior treatment) Patient is a 36 y.o. female presenting with abscess. The history is provided by the patient. No language interpreter was used.  Abscess  This is a new problem. The onset was gradual. The problem occurs rarely. The problem has been gradually worsening. The abscess is present on the left upper leg. The problem is moderate. The abscess is characterized by redness and swelling. The abscess first occurred at home.  Pt complains of of a red swollen area to left thigh.    Past Medical History  Diagnosis Date  . MRSA infection     History reviewed. No pertinent past surgical history.  No family history on file.  History  Substance Use Topics  . Smoking status: Current Everyday Smoker  . Smokeless tobacco: Not on file  . Alcohol Use: No    OB History    Grav Para Term Preterm Abortions TAB SAB Ect Mult Living                  Review of Systems  Skin: Positive for wound.  All other systems reviewed and are negative.    Allergies  Review of patient's allergies indicates no known allergies.  Home Medications   Current Outpatient Rx  Name Route Sig Dispense Refill  . ACETAMINOPHEN 325 MG PO TABS Oral Take 650 mg by mouth every 6 (six) hours as needed.      BP 125/66  Pulse 70  Temp(Src) 98 F (36.7 C) (Oral)  Resp 16  SpO2 98%  LMP 06/06/2011  Physical Exam  Nursing note and vitals reviewed. Constitutional: She is oriented to person, place, and time. She appears well-developed and well-nourished.  Musculoskeletal: She exhibits tenderness.       15 cm area of redness left posterior thigh  Neurological: She is alert and oriented to person, place, and time. She has normal reflexes.  Skin: There  is erythema.  Psychiatric: She has a normal mood and affect.    ED Course  INCISION AND DRAINAGE Date/Time: 07/06/2011 2:01 PM Performed by: Elson Areas Authorized by: Luiz Blare Consent given by: patient Patient understanding: patient states understanding of the procedure being performed Type: abscess Body area: lower extremity Location details: left leg Anesthesia: local infiltration Scalpel size: 11 Incision type: elliptical Complexity: simple Drainage amount: moderate Packing material: 1/4 in iodoform gauze Patient tolerance: Patient tolerated the procedure well with no immediate complications.   (including critical care time)  Labs Reviewed - No data to display No results found.   No diagnosis found.    MDM    Bactrim DS.   Hydrocodone      Lonia Skinner [place], [place] 07/06/11 [phone]

## 2011-07-06 NOTE — ED Notes (Signed)
Knot to left posterior thigh, noticed 2-3 days ago.  Reports area very painful

## 2011-07-06 NOTE — Discharge Instructions (Signed)

## 2011-07-09 ENCOUNTER — Encounter (HOSPITAL_COMMUNITY): Payer: Self-pay | Admitting: Emergency Medicine

## 2011-07-09 ENCOUNTER — Encounter (HOSPITAL_COMMUNITY): Payer: Self-pay

## 2011-07-09 ENCOUNTER — Emergency Department (INDEPENDENT_AMBULATORY_CARE_PROVIDER_SITE_OTHER)
Admission: EM | Admit: 2011-07-09 | Discharge: 2011-07-09 | Disposition: A | Discharge status: Home / Self Care | Payer: Self-pay | Source: Home / Self Care | Attending: Family Medicine | Admitting: Family Medicine

## 2011-07-09 ENCOUNTER — Emergency Department (HOSPITAL_COMMUNITY)
Admission: EM | Admit: 2011-07-09 | Discharge: 2011-07-09 | Disposition: A | Discharge status: Home / Self Care | Payer: Self-pay | Attending: Emergency Medicine | Admitting: Emergency Medicine

## 2011-07-09 DIAGNOSIS — L03116 Cellulitis of left lower limb: Secondary | ICD-10-CM

## 2011-07-09 DIAGNOSIS — L02419 Cutaneous abscess of limb, unspecified: Secondary | ICD-10-CM

## 2011-07-09 DIAGNOSIS — M79609 Pain in unspecified limb: Secondary | ICD-10-CM | POA: Insufficient documentation

## 2011-07-09 NOTE — ED Notes (Signed)
Went to urgent care a few days ago, and had I&D and packing done; pt went to urgent care today to have it recheck, and [facility] sent to ED for possible cellulitis; pt reports that she had packing removed at [facility]; pt reports redness, soreness, and firm area on L post thigh; was placed on septra originally; pain meds not working anymore

## 2011-07-09 NOTE — ED Notes (Signed)
Patient left ED at this time.  RN informed.

## 2011-07-09 NOTE — ED Notes (Signed)
Patient wound area more red , sending to ED for further care

## 2011-07-09 NOTE — ED Provider Notes (Signed)
Dx abscess  Medical screening examination/treatment/procedure(s) were performed by non-physician practitioner and as supervising physician I was immediately available for consultation/collaboration.  Luiz Blare MD   Luiz Blare, MD 07/09/11 [phone]
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
## 2011-07-09 NOTE — ED Notes (Signed)
Recheck of abscess thigh; out of pain pills

## 2011-07-10 ENCOUNTER — Observation Stay (HOSPITAL_COMMUNITY)
Admission: EM | Admit: 2011-07-10 | Discharge: 2011-07-10 | Disposition: A | Discharge status: Home / Self Care | Payer: Self-pay | Attending: Emergency Medicine | Admitting: Emergency Medicine

## 2011-07-10 ENCOUNTER — Encounter (HOSPITAL_COMMUNITY): Payer: Self-pay

## 2011-07-10 DIAGNOSIS — L03116 Cellulitis of left lower limb: Secondary | ICD-10-CM

## 2011-07-10 DIAGNOSIS — L03119 Cellulitis of unspecified part of limb: Secondary | ICD-10-CM | POA: Insufficient documentation

## 2011-07-10 DIAGNOSIS — L02419 Cutaneous abscess of limb, unspecified: Principal | ICD-10-CM | POA: Insufficient documentation

## 2011-07-10 MED ORDER — ONDANSETRON HCL 4 MG/2ML IJ SOLN: 4.0000 mg | Freq: Four times a day (QID) | INTRAMUSCULAR | Status: DC | PRN

## 2011-07-10 MED ORDER — MORPHINE SULFATE 4 MG/ML IJ SOLN
4.0000 mg | Freq: Once | INTRAMUSCULAR | Status: AC
  Administered 2011-07-10: 4 mg via INTRAVENOUS
  Filled 2011-07-10: qty 1

## 2011-07-10 MED ORDER — CLINDAMYCIN PHOSPHATE 600 MG/50ML IV SOLN
600.0000 mg | Freq: Once | INTRAVENOUS | Status: AC
  Administered 2011-07-10: 600 mg via INTRAVENOUS
  Filled 2011-07-10: qty 50

## 2011-07-10 MED ORDER — HYDROCODONE-ACETAMINOPHEN 5-500 MG PO TABS: 1.0000 | ORAL_TABLET | Freq: Four times a day (QID) | ORAL | Status: AC | PRN

## 2011-07-10 MED ORDER — MORPHINE SULFATE 4 MG/ML IJ SOLN: 4.0000 mg | INTRAMUSCULAR | Status: DC | PRN

## 2011-07-10 MED ORDER — ONDANSETRON HCL 4 MG/2ML IJ SOLN
4.0000 mg | Freq: Once | INTRAMUSCULAR | Status: AC
  Administered 2011-07-10: 4 mg via INTRAVENOUS
  Filled 2011-07-10: qty 2

## 2011-07-10 NOTE — ED Notes (Signed)
Pt. Was at our  Urgent care yesterday and sent to [place] to have her abscess rechecked.  Pt. Was unable to stay yesterday due to wait.  She is here to have her lt. Inner thigh abscess rechecked.  She believes it has improved slightly, but she continues to have pain and is our of her pain meds continues taking the Septra DS.

## 2011-07-10 NOTE — Discharge Instructions (Signed)
Continue taking your at-home Bactrim/Septra until the antibiotic is complete. Keep wound clean with mild soap and water and monitor for worsening of infection. Followup with the [facility] urgent care in 48 hours for recheck of wound however return to emergency department immediately for any changing or worsening symptoms as discussed. You may alternate between Tylenol and ibuprofen as needed for pain or use Vicodin for more moderate pain. Do not drive or operate machinery with Vicodin use. Warm compresses may also be beneficial.  Cellulitis Cellulitis is an infection of the tissue under the skin. The infected area is usually red and tender. This is caused by germs. These germs enter the body through cuts or sores. This usually happens in the arms or lower legs. HOME CARE   Take your medicine as told. Finish it even if you start to feel better.   If the infection is on the arm or leg, keep it raised (elevated).   Use a warm cloth on the infected area several times a day.   See your doctor for a follow-up visit as told.  GET HELP RIGHT AWAY IF:   You are tired or confused.   You throw up (vomit).   You have watery poop (diarrhea).   You feel ill and have muscle aches.   You have a fever.  MAKE SURE YOU:   Understand these instructions.   Will watch your condition.   Will get help right away if you are not doing well or get worse.  Document Released: 08/01/2007 Document Revised: 02/01/2011 Document Reviewed: 01/14/2009 [facility] Patient Information 2012 [place], [place].

## 2011-07-10 NOTE — ED Provider Notes (Signed)
History     CSN: [id number]  Arrival date & time 07/10/11  0919   First MD Initiated Contact with Patient 07/10/11 [phone]      Chief Complaint  Patient presents with  . Abscess    (Consider location/radiation/quality/duration/timing/severity/associated sxs/prior treatment) Patient is a 36 y.o. female presenting with abscess.  Abscess   Patient presents to ER complaining of erythema and redness of left posterior thigh after I&D by [facility] a few days ago. Patient states that she went to [facility] yesterday for wound recheck and packing removal and was sent to ER for further evaluation for concern of worsening cellulitis associated with drained abscess but she states she left before being seen due to wait time. Patient returns today for recheck but states that erythema has improved from yesterday to today. Mild to moderate ongoing pain that has not changed but patient states she is out of her pain medication with did help significantly while she was taking it. Denies fevers, chills, drainage from thigh, radiating erythema or pain. Patient states that she has taken 3 days of bactrim and is continuing to take it with 7 days left.   Past Medical History  Diagnosis Date  . MRSA infection     No past surgical history on file.  No family history on file.  History  Substance Use Topics  . Smoking status: Current Everyday Smoker -- 1.0 packs/day  . Smokeless tobacco: Not on file  . Alcohol Use: Yes     occasion    OB History    Grav Para Term Preterm Abortions TAB SAB Ect Mult Living                  Review of Systems  All other systems reviewed and are negative.    Allergies  Review of patient's allergies indicates no known allergies.  Home Medications   Current Outpatient Rx  Name Route Sig Dispense Refill  . ACETAMINOPHEN 325 MG PO TABS Oral Take 650 mg by mouth every 6 (six) hours as needed. For pain    . HYDROCODONE-ACETAMINOPHEN 5-325 MG PO TABS Oral Take 2 tablets by mouth  every 4 (four) hours as needed for pain. 10 tablet 0  . SULFAMETHOXAZOLE-TRIMETHOPRIM 800-160 MG PO TABS Oral Take 1 tablet by mouth every 12 (twelve) hours. 14 tablet 0    BP 119/93  Pulse 81  Temp 97.9 F (36.6 C)  Resp 20  SpO2 100%  LMP 06/06/2011  Physical Exam  Nursing note and vitals reviewed. Constitutional: She is oriented to person, place, and time. She appears well-developed and well-nourished. No distress.  HENT:  Head: Normocephalic and atraumatic.  Eyes: Conjunctivae are normal.  Neck: Normal range of motion. Neck supple.  Cardiovascular: Normal rate, regular rhythm, normal heart sounds and intact distal pulses.  Exam reveals no gallop and no friction rub.   No murmur heard. Pulmonary/Chest: Effort normal and breath sounds normal. No respiratory distress. She has no wheezes. She has no rales. She exhibits no tenderness.  Abdominal: Soft. Bowel sounds are normal. She exhibits no distension and no mass. There is no tenderness. There is no rebound and no guarding.  Musculoskeletal: Normal range of motion. She exhibits edema and tenderness.       See skin exam.   Neurological: She is alert and oriented to person, place, and time. She has normal reflexes.  Skin: Skin is warm and dry. No rash noted. She is not diaphoretic. There is erythema.  Hand sized area of erythema of left posterior upper thigh with central wound opening without drainage. TTP but no crepitous or fluctuance. Faint induration. Erythema mild to moderate.   Psychiatric: She has a normal mood and affect.    ED Course  Procedures (including critical care time)  IV clinda, morphine and zofran  Patient moved to CDU on cellulitis protocol though strong likelihood of d/c home after one dose of IV abx given the improved state of redness from yesterday to today  Labs Reviewed - No data to display No results found.   1. Cellulitis of left leg       MDM  Patient was given a dose of IV antibiotics  in the ER. Patient states pain is much improved after IV morphine. Erythema has decreased. Borders have been marked a skin pen. Patient is agreeable to following up with urgent care 48 hours for recheck however we spoke at length about changing or worsening symptoms that should prompt immediate return to the ER. She's afebrile nontoxic-appearing.        [place], [place] 07/10/11 1055

## 2011-07-12 NOTE — ED Provider Notes (Signed)
Medical screening examination/treatment/procedure(s) were performed by non-physician practitioner and as supervising physician I was immediately available for consultation/collaboration.  Donnetta Hutching, MD 07/12/11 [phone]

## 2011-10-25 ENCOUNTER — Emergency Department (HOSPITAL_COMMUNITY)
Admission: EM | Admit: 2011-10-25 | Discharge: 2011-10-26 | Disposition: A | Discharge status: Home / Self Care | Payer: Self-pay | Attending: Emergency Medicine | Admitting: Emergency Medicine

## 2011-10-25 ENCOUNTER — Encounter (HOSPITAL_COMMUNITY): Payer: Self-pay | Admitting: *Deleted

## 2011-10-25 DIAGNOSIS — F17200 Nicotine dependence, unspecified, uncomplicated: Secondary | ICD-10-CM | POA: Insufficient documentation

## 2011-10-25 DIAGNOSIS — K0889 Other specified disorders of teeth and supporting structures: Secondary | ICD-10-CM

## 2011-10-25 DIAGNOSIS — K029 Dental caries, unspecified: Secondary | ICD-10-CM | POA: Insufficient documentation

## 2011-10-25 MED ORDER — HYDROCODONE-ACETAMINOPHEN 5-325 MG PO TABS: 1.0000 | ORAL_TABLET | ORAL | Status: AC | PRN

## 2011-10-25 MED ORDER — PENICILLIN V POTASSIUM 500 MG PO TABS: 500.0000 mg | ORAL_TABLET | Freq: Three times a day (TID) | ORAL | Status: AC

## 2011-10-25 MED ORDER — HYDROCODONE-ACETAMINOPHEN 5-325 MG PO TABS
1.0000 | ORAL_TABLET | Freq: Once | ORAL | Status: AC
  Administered 2011-10-25: 1 via ORAL
  Filled 2011-10-25 (×2): qty 1

## 2011-10-25 MED ORDER — HYDROCODONE-ACETAMINOPHEN 5-325 MG PO TABS
1.0000 | ORAL_TABLET | Freq: Once | ORAL | Status: AC
  Administered 2011-10-25: 1 via ORAL

## 2011-10-25 NOTE — ED Notes (Signed)
Prt c/o right lower molar dental pain times three days.   Rates pain 10/10 throbbing and shooting pain.

## 2011-10-25 NOTE — ED Notes (Signed)
Pt has been having RL molar pain x 3 days.  She has been using oragel and goodys powder, but nothing is improving.  Around 2145 she began to exp throat swelling and sob.  She states she is taking lipozine x 3 days.  No tonsilar, uvular or tongue swelling noted at this time.

## 2011-10-25 NOTE — ED Provider Notes (Signed)
History     CSN: [id number]  Arrival date & time 10/25/11  2203   First MD Initiated Contact with Patient 10/25/11 2255      Chief Complaint  Patient presents with  . Dental Pain  . Allergic Reaction   HPI  History provided by the patient. Patient is a 36 year old female with no significant PMH who presents with complaints of increased and severe right lower molar pain. Pain has been present for the past 3 days has increased significantly today. Patient has been trying to use over-the-counter pain medications including origin of without significant improvement. Patient does report using Orajel this evening and also became concerned because she felt some tingling and numbness in her throat area. She was worried she was having swelling of her throat. She denies any difficulty swallowing or breathing. Patient also feels she is having some swelling in her cheek area. She denies any swelling under the tongue. She denies having any fever chills or sweats.    History reviewed. No pertinent past medical history.  History reviewed. No pertinent past surgical history.  No family history on file.  History  Substance Use Topics  . Smoking status: Current Everyday Smoker -- 1.0 packs/day  . Smokeless tobacco: Not on file  . Alcohol Use: Yes    OB History    Grav Para Term Preterm Abortions TAB SAB Ect Mult Living                  Review of Systems  Constitutional: Negative for fever and chills.  HENT: Positive for dental problem. Negative for sore throat, trouble swallowing and voice change.   Respiratory: Negative for shortness of breath.   Cardiovascular: Negative for chest pain.    Allergies  Review of patient's allergies indicates no known allergies.  Home Medications   Current Outpatient Rx  Name Route Sig Dispense Refill  . GOODY HEADACHE PO Oral Take 1 tablet by mouth every 6 (six) hours as needed. For headaches    . ASPIRIN-CAFFEINE 845-65 MG PO PACK Oral Take 1 packet  by mouth daily as needed. For headaches    . DIPHENHYDRAMINE HCL 25 MG PO TABS Oral Take 25 mg by mouth every 6 (six) hours as needed. For allergies/itching    . OVER THE COUNTER MEDICATION Oral Take 1 tablet by mouth daily. "Lipozene-weight loss tablets"    . OVER THE COUNTER MEDICATION Oral Take 1 tablet by mouth 2 (two) times daily. "Diurex Ultra-fluid retention tablets"      BP 143/90  Pulse 77  Temp 98.1 F (36.7 C) (Oral)  Resp 18  SpO2 100%  Physical Exam  Nursing note and vitals reviewed. Constitutional: She is oriented to person, place, and time. She appears well-developed and well-nourished. No distress.  HENT:  Head: Normocephalic.  Mouth/Throat: Oropharynx is clear and moist.         There is dental caries and decay of the right third molar with partial impaction. There is mild swelling to the lateral adjacent gum. Pain to percussion over molar. No swelling of the tongue. Oropharynx clear. No stridor.  Neck: Normal range of motion. Neck supple.  Cardiovascular: Normal rate and regular rhythm.   Pulmonary/Chest: Effort normal and breath sounds normal.  Lymphadenopathy:    She has no cervical adenopathy.  Neurological: She is alert and oriented to person, place, and time.  Skin: Skin is warm and dry. No rash noted. No erythema.  Psychiatric: She has a normal mood and affect. Her behavior  is normal.    ED Course  Procedures   Dental Block Performed by: Angus Seller Authorized by: Angus Seller Consent: Verbal consent obtained. Risks and benefits: risks, benefits and alternatives were discussed Consent given by: patient Patient identity confirmed: provided demographic data  Location: Right lower third molar  Local anesthetic: Bupivacaine 0.5% with epinephrine  Anesthetic total: 1.8 ml  Irrigation method: syringe  Patient tolerance: Patient tolerated the procedure well with no immediate complications. Pain improved.     1. Pain, dental   2. Dental  caries       MDM  10:50 PM patient seen and evaluated. Patient appears in discomfort. Normal respirations and O2 sats were no swelling of oropharynx neck. Do not suspect any allergic reaction.        Angus Seller, [place] 10/25/11 2342

## 2011-10-26 NOTE — ED Provider Notes (Signed)
Medical screening examination/treatment/procedure(s) were performed by non-physician practitioner and as supervising physician I was immediately available for consultation/collaboration.  Jones Skene, M.D.     Jones Skene, MD 10/26/11 [phone]

## 2012-03-25 ENCOUNTER — Encounter (HOSPITAL_COMMUNITY): Payer: Self-pay | Admitting: *Deleted

## 2012-03-25 ENCOUNTER — Emergency Department (HOSPITAL_COMMUNITY): Payer: Self-pay

## 2012-03-25 ENCOUNTER — Emergency Department (HOSPITAL_COMMUNITY)
Admission: EM | Admit: 2012-03-25 | Discharge: 2012-03-25 | Disposition: A | Discharge status: Home / Self Care | Payer: Self-pay | Attending: Emergency Medicine | Admitting: Emergency Medicine

## 2012-03-25 DIAGNOSIS — S0010XA Contusion of unspecified eyelid and periocular area, initial encounter: Secondary | ICD-10-CM | POA: Insufficient documentation

## 2012-03-25 DIAGNOSIS — S0510XA Contusion of eyeball and orbital tissues, unspecified eye, initial encounter: Secondary | ICD-10-CM

## 2012-03-25 DIAGNOSIS — S0230XA Fracture of orbital floor, unspecified side, initial encounter for closed fracture: Secondary | ICD-10-CM | POA: Insufficient documentation

## 2012-03-25 DIAGNOSIS — S0285XA Fracture of orbit, unspecified, initial encounter for closed fracture: Secondary | ICD-10-CM

## 2012-03-25 DIAGNOSIS — F17200 Nicotine dependence, unspecified, uncomplicated: Secondary | ICD-10-CM | POA: Insufficient documentation

## 2012-03-25 MED ORDER — HYDROMORPHONE HCL PF 1 MG/ML IJ SOLN
1.0000 mg | Freq: Once | INTRAMUSCULAR | Status: AC
  Administered 2012-03-25: 1 mg via INTRAVENOUS
  Filled 2012-03-25: qty 1

## 2012-03-25 MED ORDER — LORAZEPAM 2 MG/ML IJ SOLN
1.0000 mg | Freq: Once | INTRAMUSCULAR | Status: AC
  Administered 2012-03-25: 1 mg via INTRAVENOUS
  Filled 2012-03-25: qty 1

## 2012-03-25 MED ORDER — ALPRAZOLAM 0.5 MG PO TABS: 0.5000 mg | ORAL_TABLET | Freq: Every evening | ORAL | Status: DC | PRN

## 2012-03-25 MED ORDER — OXYCODONE-ACETAMINOPHEN 5-325 MG PO TABS: ORAL_TABLET | ORAL | Status: DC

## 2012-03-25 MED ORDER — MORPHINE SULFATE 4 MG/ML IJ SOLN
4.0000 mg | INTRAMUSCULAR | Status: DC | PRN
  Administered 2012-03-25: 4 mg via INTRAVENOUS
  Filled 2012-03-25: qty 1

## 2012-03-25 MED ORDER — ONDANSETRON HCL 4 MG/2ML IJ SOLN
4.0000 mg | Freq: Once | INTRAMUSCULAR | Status: AC
  Administered 2012-03-25: 4 mg via INTRAVENOUS
  Filled 2012-03-25: qty 2

## 2012-03-25 NOTE — ED Notes (Signed)
Patient transported to CT 

## 2012-03-25 NOTE — ED Provider Notes (Signed)
Medical screening examination/treatment/procedure(s) were conducted as a shared visit with non-physician practitioner(s) and myself.  I personally evaluated the patient during the encounter  Doug Sou, MD 03/25/12 [phone]
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
## 2012-05-25 ENCOUNTER — Emergency Department (HOSPITAL_COMMUNITY)
Admission: EM | Admit: 2012-05-25 | Discharge: 2012-05-25 | Disposition: A | Discharge status: Home / Self Care | Payer: Self-pay | Attending: Emergency Medicine | Admitting: Emergency Medicine

## 2012-05-25 ENCOUNTER — Emergency Department (HOSPITAL_COMMUNITY): Payer: Self-pay

## 2012-05-25 ENCOUNTER — Encounter (HOSPITAL_COMMUNITY): Payer: Self-pay | Admitting: Emergency Medicine

## 2012-05-25 DIAGNOSIS — S0100XA Unspecified open wound of scalp, initial encounter: Secondary | ICD-10-CM | POA: Insufficient documentation

## 2012-05-25 DIAGNOSIS — S0181XA Laceration without foreign body of other part of head, initial encounter: Secondary | ICD-10-CM

## 2012-05-25 DIAGNOSIS — S0180XA Unspecified open wound of other part of head, initial encounter: Secondary | ICD-10-CM | POA: Insufficient documentation

## 2012-05-25 DIAGNOSIS — Y9389 Activity, other specified: Secondary | ICD-10-CM | POA: Insufficient documentation

## 2012-05-25 DIAGNOSIS — F1410 Cocaine abuse, uncomplicated: Secondary | ICD-10-CM | POA: Insufficient documentation

## 2012-05-25 DIAGNOSIS — Z3202 Encounter for pregnancy test, result negative: Secondary | ICD-10-CM | POA: Insufficient documentation

## 2012-05-25 DIAGNOSIS — Y92410 Unspecified street and highway as the place of occurrence of the external cause: Secondary | ICD-10-CM | POA: Insufficient documentation

## 2012-05-25 DIAGNOSIS — F10929 Alcohol use, unspecified with intoxication, unspecified: Secondary | ICD-10-CM

## 2012-05-25 DIAGNOSIS — F1010 Alcohol abuse, uncomplicated: Secondary | ICD-10-CM | POA: Insufficient documentation

## 2012-05-25 DIAGNOSIS — F17200 Nicotine dependence, unspecified, uncomplicated: Secondary | ICD-10-CM | POA: Insufficient documentation

## 2012-05-25 DIAGNOSIS — S0990XA Unspecified injury of head, initial encounter: Secondary | ICD-10-CM | POA: Insufficient documentation

## 2012-05-25 DIAGNOSIS — S20219A Contusion of unspecified front wall of thorax, initial encounter: Secondary | ICD-10-CM | POA: Insufficient documentation

## 2012-05-25 DIAGNOSIS — S301XXA Contusion of abdominal wall, initial encounter: Secondary | ICD-10-CM | POA: Insufficient documentation

## 2012-05-25 LAB — RAPID URINE DRUG SCREEN, HOSP PERFORMED
Amphetamines: NOT DETECTED
Benzodiazepines: NOT DETECTED
Tetrahydrocannabinol: NOT DETECTED

## 2012-05-25 LAB — CBC
Hemoglobin: 12.8 g/dL (ref 12.0–15.0)
MCH: 27.2 pg (ref 26.0–34.0)
MCHC: 32.5 g/dL (ref 30.0–36.0)
MCV: 83.8 fL (ref 78.0–100.0)
RBC: 4.7 MIL/uL (ref 3.87–5.11)

## 2012-05-25 LAB — APTT: aPTT: 28 seconds (ref 24–37)

## 2012-05-25 LAB — URINALYSIS, ROUTINE W REFLEX MICROSCOPIC
Glucose, UA: NEGATIVE mg/dL
Ketones, ur: NEGATIVE mg/dL
Leukocytes, UA: NEGATIVE
Protein, ur: 30 mg/dL — AB
Urobilinogen, UA: 0.2 mg/dL (ref 0.0–1.0)

## 2012-05-25 LAB — COMPREHENSIVE METABOLIC PANEL
BUN: 7 mg/dL (ref 6–23)
CO2: 23 mEq/L (ref 19–32)
Calcium: 8.8 mg/dL (ref 8.4–10.5)
Creatinine, Ser: 0.76 mg/dL (ref 0.50–1.10)
GFR calc Af Amer: >90 mL/min (ref >90)
GFR calc non Af Amer: >90 mL/min (ref >90)
Glucose, Bld: 128 mg/dL — ABNORMAL HIGH (ref 70–99)
Total Protein: 7.3 g/dL (ref 6.0–8.3)

## 2012-05-25 LAB — TYPE AND SCREEN: ABO/RH(D): O POS

## 2012-05-25 LAB — URINE MICROSCOPIC-ADD ON

## 2012-05-25 LAB — PROTIME-INR
INR: 0.92 (ref 0.00–1.49)
Prothrombin Time: 12.3 seconds (ref 11.6–15.2)

## 2012-05-25 LAB — ETHANOL: Alcohol, Ethyl (B): 208 mg/dL — ABNORMAL HIGH (ref 0–11)

## 2012-05-25 MED ORDER — IOHEXOL 300 MG/ML  SOLN
100.0000 mL | Freq: Once | INTRAMUSCULAR | Status: AC | PRN
  Administered 2012-05-25: 100 mL via INTRAVENOUS

## 2012-05-25 MED ORDER — TRAMADOL HCL 50 MG PO TABS: 50.0000 mg | ORAL_TABLET | Freq: Four times a day (QID) | ORAL | Status: DC | PRN

## 2012-05-25 MED ORDER — TETANUS-DIPHTH-ACELL PERTUSSIS 5-2.5-18.5 LF-MCG/0.5 IM SUSP
0.5000 mL | Freq: Once | INTRAMUSCULAR | Status: AC
  Administered 2012-05-25: 0.5 mL via INTRAMUSCULAR
  Filled 2012-05-25: qty 0.5

## 2012-05-25 MED ORDER — IBUPROFEN 800 MG PO TABS: 800.0000 mg | ORAL_TABLET | Freq: Four times a day (QID) | ORAL | Status: DC | PRN

## 2012-05-25 MED ORDER — MORPHINE SULFATE 4 MG/ML IJ SOLN
4.0000 mg | Freq: Once | INTRAMUSCULAR | Status: AC
  Administered 2012-05-25: 2 mg via INTRAVENOUS
  Filled 2012-05-25: qty 1

## 2012-05-25 MED ORDER — MORPHINE SULFATE 2 MG/ML IJ SOLN: 2.0000 mg | Freq: Once | INTRAMUSCULAR | Status: DC

## 2012-05-25 MED ORDER — THIAMINE HCL 100 MG/ML IJ SOLN
100.0000 mg | Freq: Once | INTRAMUSCULAR | Status: AC
  Administered 2012-05-25: 100 mg via INTRAVENOUS
  Filled 2012-05-25: qty 2

## 2012-05-25 MED ORDER — LORAZEPAM 2 MG/ML IJ SOLN
INTRAMUSCULAR | Status: AC
  Administered 2012-05-25: 1 mg via INTRAVENOUS
  Filled 2012-05-25: qty 1

## 2012-05-25 MED ORDER — LORAZEPAM 2 MG/ML IJ SOLN: 1.0000 mg | Freq: Once | INTRAMUSCULAR | Status: AC

## 2012-05-25 MED ORDER — LORAZEPAM 2 MG/ML IJ SOLN
1.0000 mg | Freq: Once | INTRAMUSCULAR | Status: AC
  Administered 2012-05-25: 1 mg via INTRAVENOUS
  Filled 2012-05-25: qty 1

## 2012-05-25 MED ORDER — SODIUM CHLORIDE 0.9 % IV BOLUS (SEPSIS)
1000.0000 mL | Freq: Once | INTRAVENOUS | Status: AC
  Administered 2012-05-25: 1000 mL via INTRAVENOUS
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
## 2012-05-25 NOTE — ED Provider Notes (Signed)
Monique [name] Patient discussed with attending physician. Will assist with laceration or parasellar forehead.   LACERATION REPAIR Performed by: Angus Seller Authorized by: Angus Seller Consent: Verbal consent obtained. Risks and benefits: risks, benefits and alternatives were discussed Consent given by: patient Patient identity confirmed: provided demographic data Prepped and Draped in normal sterile fashion Wound explored  Laceration Location: fore head  Laceration Length: 6 cm  No Foreign Bodies seen or palpated  Anesthesia: local infiltration  Local anesthetic: lidocaine 2% with epinephrine  Anesthetic total: 5 ml  Irrigation method: syringe Amount of cleaning: standard  Skin closure: Skin with 6-0 Prolene   Number of sutures: 9   Technique: Simple interrupted   Patient tolerance: Patient tolerated the procedure well with no immediate complications.    LACERATION REPAIR Performed by: Angus Seller Authorized by: Angus Seller Consent: Verbal consent obtained. Risks and benefits: risks, benefits and alternatives were discussed Consent given by: patient Patient identity confirmed: provided demographic data Prepped and Draped in normal sterile fashion Wound explored  Laceration Location: Left eyebrow  Laceration Length: 3 cm  No Foreign Bodies seen or palpated  Anesthesia: local infiltration  Local anesthetic: lidocaine 2% with epinephrine  Anesthetic total: 5 ml  Irrigation method: syringe Amount of cleaning: standard  Skin closure: Skin with 6-0 Prolene   Number of sutures: 4   Technique: Simple interrupted   Patient tolerance: Patient tolerated the procedure well with no immediate complications.   Angus Seller, PA-C 05/25/12 [phone]

## 2012-05-25 NOTE — ED Notes (Signed)
Pt transported to radiology.

## 2012-05-26 ENCOUNTER — Encounter (HOSPITAL_COMMUNITY): Payer: Self-pay | Admitting: Emergency Medicine

## 2012-06-02 ENCOUNTER — Emergency Department (HOSPITAL_COMMUNITY): Payer: Self-pay

## 2012-06-02 ENCOUNTER — Emergency Department (HOSPITAL_COMMUNITY)
Admission: EM | Admit: 2012-06-02 | Discharge: 2012-06-02 | Disposition: A | Discharge status: Home / Self Care | Payer: Self-pay | Attending: Emergency Medicine | Admitting: Emergency Medicine

## 2012-06-02 ENCOUNTER — Encounter (HOSPITAL_COMMUNITY): Payer: Self-pay | Admitting: *Deleted

## 2012-06-02 DIAGNOSIS — Z8614 Personal history of Methicillin resistant Staphylococcus aureus infection: Secondary | ICD-10-CM | POA: Insufficient documentation

## 2012-06-02 DIAGNOSIS — G8911 Acute pain due to trauma: Secondary | ICD-10-CM | POA: Insufficient documentation

## 2012-06-02 DIAGNOSIS — R0789 Other chest pain: Secondary | ICD-10-CM

## 2012-06-02 DIAGNOSIS — R071 Chest pain on breathing: Secondary | ICD-10-CM | POA: Insufficient documentation

## 2012-06-02 DIAGNOSIS — R51 Headache: Secondary | ICD-10-CM | POA: Insufficient documentation

## 2012-06-02 DIAGNOSIS — F17200 Nicotine dependence, unspecified, uncomplicated: Secondary | ICD-10-CM | POA: Insufficient documentation

## 2012-06-02 LAB — CBC
HCT: 33.9 % — ABNORMAL LOW (ref 36.0–46.0)
Hemoglobin: 11.2 g/dL — ABNORMAL LOW (ref 12.0–15.0)
MCH: 27.4 pg (ref 26.0–34.0)
MCHC: 33 g/dL (ref 30.0–36.0)
MCV: 82.9 fL (ref 78.0–100.0)
Platelets: 315 10*3/uL (ref 150–400)
RBC: 4.09 MIL/uL (ref 3.87–5.11)
RDW: 15.1 % (ref 11.5–15.5)
WBC: 5.8 10*3/uL (ref 4.0–10.5)

## 2012-06-02 LAB — POCT I-STAT, CHEM 8
BUN: 13 mg/dL (ref 6–23)
Chloride: 107 mEq/L (ref 96–112)
Creatinine, Ser: 0.7 mg/dL (ref 0.50–1.10)
Glucose, Bld: 99 mg/dL (ref 70–99)
Potassium: 4 mEq/L (ref 3.5–5.1)
Sodium: 139 mEq/L (ref 135–145)

## 2012-06-02 LAB — BASIC METABOLIC PANEL
BUN: 13 mg/dL (ref 6–23)
CO2: 23 mEq/L (ref 19–32)
Calcium: 8.2 mg/dL — ABNORMAL LOW (ref 8.4–10.5)
Chloride: 104 mEq/L (ref 96–112)
Creatinine, Ser: 0.79 mg/dL (ref 0.50–1.10)
GFR calc Af Amer: >90 mL/min (ref >90)
GFR calc non Af Amer: >90 mL/min (ref >90)
Glucose, Bld: 98 mg/dL (ref 70–99)
Potassium: 4 mEq/L (ref 3.5–5.1)
Sodium: 136 mEq/L (ref 135–145)

## 2012-06-02 MED ORDER — KETOROLAC TROMETHAMINE 30 MG/ML IJ SOLN
30.0000 mg | Freq: Once | INTRAMUSCULAR | Status: AC
  Administered 2012-06-02: 30 mg via INTRAVENOUS
  Filled 2012-06-02: qty 1

## 2012-06-02 MED ORDER — SODIUM CHLORIDE 0.9 % IV BOLUS (SEPSIS)
1000.0000 mL | Freq: Once | INTRAVENOUS | Status: AC
  Administered 2012-06-02: 1000 mL via INTRAVENOUS

## 2012-06-02 MED ORDER — IOHEXOL 350 MG/ML SOLN
100.0000 mL | Freq: Once | INTRAVENOUS | Status: AC | PRN
  Administered 2012-06-02: 100 mL via INTRAVENOUS

## 2012-06-02 MED ORDER — IBUPROFEN 800 MG PO TABS: 800.0000 mg | ORAL_TABLET | Freq: Three times a day (TID) | ORAL | Status: DC | PRN

## 2012-06-02 MED ORDER — KETOROLAC TROMETHAMINE 30 MG/ML IJ SOLN: 30.0000 mg | Freq: Once | INTRAMUSCULAR | Status: DC

## 2012-06-02 MED ORDER — MORPHINE SULFATE 4 MG/ML IJ SOLN
6.0000 mg | Freq: Once | INTRAMUSCULAR | Status: AC
  Administered 2012-06-02: 6 mg via INTRAVENOUS
  Filled 2012-06-02: qty 2

## 2012-06-02 MED ORDER — MORPHINE SULFATE 4 MG/ML IJ SOLN
4.0000 mg | Freq: Once | INTRAMUSCULAR | Status: AC
  Administered 2012-06-02: 4 mg via INTRAVENOUS
  Filled 2012-06-02: qty 1

## 2012-06-02 MED ORDER — HYDROCODONE-ACETAMINOPHEN 5-325 MG PO TABS: 1.0000 | ORAL_TABLET | Freq: Four times a day (QID) | ORAL | Status: DC | PRN

## 2012-06-02 NOTE — ED Notes (Signed)
IV team at bedside 

## 2012-06-02 NOTE — ED Notes (Signed)
Unable to gain 20 g IV access, 2nd RN attempt, IV team paged
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
## 2012-06-02 NOTE — ED Notes (Signed)
Spoke with IV team re: 2nd IV access for CT angio, pt updated on plan of care

## 2012-06-02 NOTE — ED Notes (Signed)
CT made aware of 2nd IV access

## 2012-06-02 NOTE — ED Notes (Signed)
Patient transported to CT 

## 2012-06-02 NOTE — ED Notes (Signed)
PT transported to CT>

## 2012-06-04 NOTE — ED Provider Notes (Signed)
Medical screening examination/treatment/procedure(s) were performed by non-physician practitioner and as supervising physician I was immediately available for consultation/collaboration.  Karlene Southard T Gillie Crisci, MD 06/04/12 2303 

## 2012-06-11 NOTE — ED Provider Notes (Signed)
Medical screening examination/treatment/procedure(s) were performed by non-physician practitioner and as supervising physician I was immediately available for consultation/collaboration.  Julie Manly, MD 06/11/12 0809 

## 2012-08-09 ENCOUNTER — Encounter (HOSPITAL_COMMUNITY): Payer: Self-pay | Admitting: Family Medicine

## 2012-08-09 ENCOUNTER — Emergency Department (HOSPITAL_COMMUNITY)
Admission: EM | Admit: 2012-08-09 | Discharge: 2012-08-09 | Disposition: A | Discharge status: Home / Self Care | Payer: Self-pay | Attending: Emergency Medicine | Admitting: Emergency Medicine

## 2012-08-09 DIAGNOSIS — R05 Cough: Secondary | ICD-10-CM | POA: Insufficient documentation

## 2012-08-09 DIAGNOSIS — Z8614 Personal history of Methicillin resistant Staphylococcus aureus infection: Secondary | ICD-10-CM | POA: Insufficient documentation

## 2012-08-09 DIAGNOSIS — R059 Cough, unspecified: Secondary | ICD-10-CM | POA: Insufficient documentation

## 2012-08-09 DIAGNOSIS — F17200 Nicotine dependence, unspecified, uncomplicated: Secondary | ICD-10-CM | POA: Insufficient documentation

## 2012-08-09 DIAGNOSIS — M25512 Pain in left shoulder: Secondary | ICD-10-CM

## 2012-08-09 DIAGNOSIS — M25519 Pain in unspecified shoulder: Secondary | ICD-10-CM | POA: Insufficient documentation

## 2012-08-09 DIAGNOSIS — Z87828 Personal history of other (healed) physical injury and trauma: Secondary | ICD-10-CM | POA: Insufficient documentation

## 2012-08-09 DIAGNOSIS — G8929 Other chronic pain: Secondary | ICD-10-CM | POA: Insufficient documentation

## 2012-08-09 MED ORDER — IBUPROFEN 600 MG PO TABS: 600.0000 mg | ORAL_TABLET | Freq: Four times a day (QID) | ORAL | Status: DC | PRN

## 2012-08-09 MED ORDER — TRAMADOL HCL 50 MG PO TABS: 50.0000 mg | ORAL_TABLET | Freq: Four times a day (QID) | ORAL | Status: DC | PRN

## 2012-08-09 MED ORDER — METHOCARBAMOL 500 MG PO TABS: 1000.0000 mg | ORAL_TABLET | Freq: Four times a day (QID) | ORAL | Status: DC
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
## 2012-08-10 NOTE — ED Provider Notes (Signed)
Medical screening examination/treatment/procedure(s) were performed by non-physician practitioner and as supervising physician I was immediately available for consultation/collaboration.  Riot Barrick, MD 08/10/12 1512 

## 2012-11-27 ENCOUNTER — Encounter (HOSPITAL_BASED_OUTPATIENT_CLINIC_OR_DEPARTMENT_OTHER): Payer: Self-pay

## 2012-11-27 ENCOUNTER — Emergency Department (HOSPITAL_BASED_OUTPATIENT_CLINIC_OR_DEPARTMENT_OTHER)
Admission: EM | Admit: 2012-11-27 | Discharge: 2012-11-27 | Disposition: A | Discharge status: Home / Self Care | Payer: No Typology Code available for payment source | Attending: Emergency Medicine | Admitting: Emergency Medicine

## 2012-11-27 DIAGNOSIS — Z8614 Personal history of Methicillin resistant Staphylococcus aureus infection: Secondary | ICD-10-CM | POA: Insufficient documentation

## 2012-11-27 DIAGNOSIS — F17200 Nicotine dependence, unspecified, uncomplicated: Secondary | ICD-10-CM | POA: Insufficient documentation

## 2012-11-27 DIAGNOSIS — Z79899 Other long term (current) drug therapy: Secondary | ICD-10-CM | POA: Insufficient documentation

## 2012-11-27 DIAGNOSIS — K089 Disorder of teeth and supporting structures, unspecified: Secondary | ICD-10-CM | POA: Insufficient documentation

## 2012-11-27 DIAGNOSIS — Z8659 Personal history of other mental and behavioral disorders: Secondary | ICD-10-CM | POA: Insufficient documentation

## 2012-11-27 DIAGNOSIS — K0889 Other specified disorders of teeth and supporting structures: Secondary | ICD-10-CM

## 2012-11-27 HISTORY — DX: Bipolar disorder, unspecified: F31.9

## 2012-11-27 MED ORDER — PENICILLIN V POTASSIUM 500 MG PO TABS: 500.0000 mg | ORAL_TABLET | Freq: Three times a day (TID) | ORAL | Status: DC

## 2012-11-27 MED ORDER — NAPROXEN 500 MG PO TABS: 500.0000 mg | ORAL_TABLET | Freq: Two times a day (BID) | ORAL | Status: DC

## 2012-11-27 MED ORDER — IBUPROFEN 800 MG PO TABS
800.0000 mg | ORAL_TABLET | Freq: Once | ORAL | Status: AC
  Administered 2012-11-27: 800 mg via ORAL
  Filled 2012-11-27: qty 1

## 2012-11-27 NOTE — ED Notes (Signed)
C/o left upper and lower toothache-pt states she is at [facility] x 3 days for ETOH abuse

## 2012-11-27 NOTE — ED Provider Notes (Signed)
CSN: [id number]     Arrival date & time 11/27/12  1123 History   First MD Initiated Contact with Patient 11/27/12 1143     Chief Complaint  Patient presents with  . Dental Pain   (Consider location/radiation/quality/duration/timing/severity/associated sxs/prior Treatment) HPI Comments: 37 yo female with hx of dental pain, currently in etoh rehab presents with left dental pain for 24 hrs. Constant, similar previous.   Patient is a 37 y.o. female presenting with tooth pain. The history is provided by the patient.  Dental Pain Location:  Generalized Quality:  Aching Severity:  Moderate Onset quality:  Gradual Duration:  1 day Timing:  Constant Progression:  Unchanged Chronicity:  Recurrent Context: poor dentition   Context: not abscess   Relieved by:  None tried Worsened by:  Nothing tried Ineffective treatments:  None tried Associated symptoms: no drooling, no fever and no headaches     Past Medical History  Diagnosis Date  . MRSA infection   . Bipolar disorder    Past Surgical History  Procedure Laterality Date  . Tubal ligation     No family history on file. History  Substance Use Topics  . Smoking status: Current Every Day Smoker -- 0.50 packs/day    Types: Cigarettes  . Smokeless tobacco: Not on file  . Alcohol Use: No     Comment: in treatment    OB History   Grav Para Term Preterm Abortions TAB SAB Ect Mult Living                 Review of Systems  Constitutional: Negative for fever and chills.  HENT: Positive for dental problem. Negative for drooling.   Cardiovascular: Negative for chest pain.  Gastrointestinal: Negative for vomiting.  Neurological: Negative for headaches.    Allergies  Review of patient's allergies indicates no known allergies.  Home Medications   Current Outpatient Rx  Name  Route  Sig  Dispense  Refill  . Divalproex Sodium (DEPAKOTE PO)   Oral   Take by mouth.         Marland Kitchen ibuprofen (ADVIL,MOTRIN) 200 MG tablet   Oral  Take 400 mg by mouth every 6 (six) hours as needed for pain.         Marland Kitchen ibuprofen (ADVIL,MOTRIN) 600 MG tablet   Oral   Take 1 tablet (600 mg total) by mouth every 6 (six) hours as needed for pain.   20 tablet   0   . methocarbamol (ROBAXIN) 500 MG tablet   Oral   Take 2 tablets (1,000 mg total) by mouth 4 (four) times daily.   20 tablet   0   . traMADol (ULTRAM) 50 MG tablet   Oral   Take 1 tablet (50 mg total) by mouth every 6 (six) hours as needed for pain.   15 tablet   0    BP 120/72  Pulse 73  Temp(Src) 98.6 F (37 C) (Oral)  Resp 16  Ht 5\' 5"  (1.651 m)  Wt 200 lb (90.719 kg)  BMI 33.28 kg/m2  SpO2 100%  LMP 11/06/2012 Physical Exam  Nursing note and vitals reviewed. Constitutional: She appears well-developed and well-nourished. No distress.  HENT:  Head: Normocephalic.  Poor dentition Tender gingiva left upper and lower, no fractured teeth, no pulp visualized No signs of ludwigs.  Neck supple  Eyes: Conjunctivae are normal. Pupils are equal, round, and reactive to light.  Neck: Normal range of motion. Neck supple.  Cardiovascular: Normal rate.   Lymphadenopathy:  She has no cervical adenopathy.    ED Course  Procedures (including critical care time) Labs Review Labs Reviewed - No data to display Imaging Review No results found.  MDM  No diagnosis found. Dental pain. Possible apical abscess. Well appearing otherwise. Smoking cessation and outpatient follow up was discussed with the patient for 3 to 5  Minutes.   Ibuprofen in ED.  Close fup with dentist discussed.  PO abx.   Dental pain   Enid Skeens, MD 11/27/12 1150

## 2013-02-15 ENCOUNTER — Encounter (HOSPITAL_COMMUNITY): Payer: Self-pay | Admitting: Emergency Medicine

## 2013-02-15 ENCOUNTER — Emergency Department (HOSPITAL_COMMUNITY)
Admission: EM | Admit: 2013-02-15 | Discharge: 2013-02-15 | Disposition: A | Discharge status: Home / Self Care | Payer: No Typology Code available for payment source | Attending: Emergency Medicine | Admitting: Emergency Medicine

## 2013-02-15 DIAGNOSIS — Z79899 Other long term (current) drug therapy: Secondary | ICD-10-CM | POA: Insufficient documentation

## 2013-02-15 DIAGNOSIS — Y929 Unspecified place or not applicable: Secondary | ICD-10-CM | POA: Insufficient documentation

## 2013-02-15 DIAGNOSIS — S335XXA Sprain of ligaments of lumbar spine, initial encounter: Secondary | ICD-10-CM | POA: Insufficient documentation

## 2013-02-15 DIAGNOSIS — K029 Dental caries, unspecified: Secondary | ICD-10-CM | POA: Insufficient documentation

## 2013-02-15 DIAGNOSIS — F319 Bipolar disorder, unspecified: Secondary | ICD-10-CM | POA: Insufficient documentation

## 2013-02-15 DIAGNOSIS — K089 Disorder of teeth and supporting structures, unspecified: Secondary | ICD-10-CM | POA: Insufficient documentation

## 2013-02-15 DIAGNOSIS — K0889 Other specified disorders of teeth and supporting structures: Secondary | ICD-10-CM

## 2013-02-15 DIAGNOSIS — X58XXXA Exposure to other specified factors, initial encounter: Secondary | ICD-10-CM | POA: Insufficient documentation

## 2013-02-15 DIAGNOSIS — S39012A Strain of muscle, fascia and tendon of lower back, initial encounter: Secondary | ICD-10-CM

## 2013-02-15 DIAGNOSIS — Z8614 Personal history of Methicillin resistant Staphylococcus aureus infection: Secondary | ICD-10-CM | POA: Insufficient documentation

## 2013-02-15 DIAGNOSIS — F17200 Nicotine dependence, unspecified, uncomplicated: Secondary | ICD-10-CM | POA: Insufficient documentation

## 2013-02-15 DIAGNOSIS — Y939 Activity, unspecified: Secondary | ICD-10-CM | POA: Insufficient documentation

## 2013-02-15 MED ORDER — CYCLOBENZAPRINE HCL 10 MG PO TABS: 10.0000 mg | ORAL_TABLET | Freq: Two times a day (BID) | ORAL | Status: DC | PRN

## 2013-02-15 MED ORDER — HYDROCODONE-ACETAMINOPHEN 5-325 MG PO TABS: 1.0000 | ORAL_TABLET | ORAL | Status: DC | PRN

## 2013-02-15 NOTE — ED Provider Notes (Signed)
CSN: [id number]     Arrival date & time 02/15/13  7829 History   First MD Initiated Contact with Patient 02/15/13 [phone]     Chief Complaint  Patient presents with  . Back Pain  . Dental Pain   (Consider location/radiation/quality/duration/timing/severity/associated sxs/prior Treatment) HPI Comments: Patient here with lower back pain and left jaw pain - she reports that she is trying to get her medicaid back so that she can be seen by a dentist and to see a PCP for further evaluation of the back pain - she reports pain in lower back, does not radiate but is assocated with a "burning numbness" to her bilateral thighs, she reports no weakness, loss of control of bowels or bladder, she states that the pain is worse with movement and that she has a sensation of cramping in her lower back.  She denies dysuria, hematuria.  She is also here with left lower #18, 19 dental pain - denies inability to open mouth, difficulty swallowing, fever, chills, abscess.  Patient is a 37 y.o. female presenting with back pain and tooth pain. The history is provided by the patient. No language interpreter was used.  Back Pain Location:  Lumbar spine Quality:  Stabbing and cramping Radiates to:  Does not radiate Pain severity:  Severe Pain is:  Same all the time Onset quality:  Gradual Duration:  5 months Timing:  Constant Progression:  Worsening Chronicity:  New Context: not lifting heavy objects, not MCA, not occupational injury, not recent illness and not recent injury   Relieved by:  Nothing Worsened by:  Nothing tried Ineffective treatments:  None tried Associated symptoms: paresthesias and tingling   Associated symptoms: no abdominal pain, no bladder incontinence, no bowel incontinence, no dysuria, no fever, no headaches, no leg pain, no numbness, no pelvic pain, no perianal numbness, no weakness and no weight loss   Dental Pain Associated symptoms: no fever and no headaches     Past Medical History    Diagnosis Date  . MRSA infection   . Bipolar disorder    Past Surgical History  Procedure Laterality Date  . Tubal ligation     History reviewed. No pertinent family history. History  Substance Use Topics  . Smoking status: Current Every Day Smoker -- 1.00 packs/day    Types: Cigarettes  . Smokeless tobacco: Never Used  . Alcohol Use: No     Comment: in treatment    OB History   Grav Para Term Preterm Abortions TAB SAB Ect Mult Living                 Review of Systems  Constitutional: Negative for fever and weight loss.  Gastrointestinal: Negative for abdominal pain and bowel incontinence.  Genitourinary: Negative for bladder incontinence, dysuria and pelvic pain.  Musculoskeletal: Positive for back pain.  Neurological: Positive for tingling and paresthesias. Negative for weakness, numbness and headaches.  All other systems reviewed and are negative.    Allergies  Review of patient's allergies indicates no known allergies.  Home Medications   Current Outpatient Rx  Name  Route  Sig  Dispense  Refill  . Chlorphen-PE-Acetaminophen (TYLENOL ALLERGY MULTI-SYMPTOM PO)   Oral   Take 2 tablets by mouth daily as needed (for cold symptoms).         . divalproex (DEPAKOTE ER) 500 MG 24 hr tablet   Oral   Take 500 mg by mouth daily.         Marland Kitchen gabapentin (NEURONTIN) 300 MG  capsule   Oral   Take 300 mg by mouth 3 (three) times daily.          BP 123/74  Pulse 87  Temp(Src) 98 F (36.7 C) (Oral)  Resp 22  SpO2 98%  LMP 01/19/2013 Physical Exam  Nursing note and vitals reviewed. Constitutional: She is oriented to person, place, and time. She appears well-developed and well-nourished. No distress.  HENT:  Head: Normocephalic and atraumatic.  Right Ear: External ear normal.  Left Ear: External ear normal.  Nose: Nose normal.  Mouth/Throat: Oropharynx is clear and moist and mucous membranes are normal. Dental caries present. No dental abscesses. No  oropharyngeal exudate.    Eyes: Conjunctivae are normal. No scleral icterus.  Neck: Normal range of motion. Neck supple. No spinous process tenderness and no muscular tenderness present.  Cardiovascular: Normal rate, regular rhythm and normal heart sounds.  Exam reveals no gallop and no friction rub.   No murmur heard. Pulmonary/Chest: Effort normal and breath sounds normal. No respiratory distress. She has no wheezes. She has no rales. She exhibits no tenderness.  Abdominal: Soft. Bowel sounds are normal. She exhibits no distension. There is no tenderness.  Musculoskeletal:       Thoracic back: She exhibits normal range of motion, no tenderness and no bony tenderness.       Lumbar back: She exhibits tenderness. She exhibits normal range of motion and no bony tenderness.       Back:  Lymphadenopathy:    She has no cervical adenopathy.  Neurological: She is alert and oriented to person, place, and time. She has normal reflexes. She exhibits normal muscle tone. Coordination normal.  Skin: Skin is warm and dry. No rash noted. No erythema. No pallor.  Psychiatric: She has a normal mood and affect. Her behavior is normal. Judgment and thought content normal.    ED Course  Procedures (including critical care time) Labs Review Labs Reviewed - No data to display Imaging Review No results found.  EKG Interpretation   None       MDM  Dental pain Lumbar strain  Patient with normal examination neurologically, no alarming signs, dentition with caries, but no abscess, no evidence of ludwigs angina, PTA, will give short course pain medicaiton and muscle relaxation.   Izola Price Marisue Humble, [place] 02/15/13 [phone]

## 2013-02-15 NOTE — ED Notes (Signed)
Pt from home with c/o pain lower back pain that shoots down her right leg and of low left back tooth pain.  Pt is waiting to for her medicare to become active to see a dentist.  Pt denies any urinary symptoms.  Pt in NAD, A&O.

## 2013-02-17 NOTE — ED Provider Notes (Signed)
Medical screening examination/treatment/procedure(s) were performed by non-physician practitioner and as supervising physician I was immediately available for consultation/collaboration.  EKG Interpretation   None        Marck Mcclenny R. Emeri Estill, MD 02/17/13 2327 

## 2013-06-02 ENCOUNTER — Encounter (HOSPITAL_COMMUNITY): Payer: Self-pay | Admitting: Emergency Medicine

## 2013-06-02 ENCOUNTER — Emergency Department (INDEPENDENT_AMBULATORY_CARE_PROVIDER_SITE_OTHER)
Admission: EM | Admit: 2013-06-02 | Discharge: 2013-06-02 | Disposition: A | Discharge status: Home / Self Care | Payer: Self-pay | Source: Home / Self Care

## 2013-06-02 DIAGNOSIS — J309 Allergic rhinitis, unspecified: Secondary | ICD-10-CM

## 2013-06-02 DIAGNOSIS — K0889 Other specified disorders of teeth and supporting structures: Secondary | ICD-10-CM

## 2013-06-02 DIAGNOSIS — K089 Disorder of teeth and supporting structures, unspecified: Secondary | ICD-10-CM

## 2013-06-02 DIAGNOSIS — F17200 Nicotine dependence, unspecified, uncomplicated: Secondary | ICD-10-CM

## 2013-06-02 DIAGNOSIS — J45909 Unspecified asthma, uncomplicated: Secondary | ICD-10-CM

## 2013-06-02 MED ORDER — ALBUTEROL SULFATE HFA 108 (90 BASE) MCG/ACT IN AERS: 2.0000 | INHALATION_SPRAY | RESPIRATORY_TRACT | Status: DC | PRN

## 2013-06-02 MED ORDER — TRIAMCINOLONE ACETONIDE 40 MG/ML IJ SUSP
INTRAMUSCULAR | Status: AC
  Filled 2013-06-02: qty 1

## 2013-06-02 MED ORDER — TRIAMCINOLONE ACETONIDE 40 MG/ML IJ SUSP
40.0000 mg | Freq: Once | INTRAMUSCULAR | Status: AC
  Administered 2013-06-02: 40 mg via INTRAMUSCULAR

## 2013-06-02 MED ORDER — GUAIFENESIN-CODEINE 100-10 MG/5ML PO SYRP: 5.0000 mL | ORAL_SOLUTION | ORAL | Status: DC | PRN

## 2013-06-02 NOTE — ED Notes (Signed)
C/o sinus pressure and pain.  Stuffy nose.  productive cough with thick yellow mucus.   Nausea.   Pain in ears.  X 3 days.  Denies fever, vomiting and diarrhea.  Left sided dental pain started yesterday.  No relief with salt water rinses or otc meds.

## 2013-06-02 NOTE — ED Provider Notes (Signed)
CSN: [id number]     Arrival date & time 06/02/13  0930 History   First MD Initiated Contact with Patient 06/02/13 [phone]     Chief Complaint  Patient presents with  . Dental Pain  . Facial Pain   (Consider location/radiation/quality/duration/timing/severity/associated sxs/prior Treatment) HPI Comments: 38 year old female with obesity and is a smoker presents with a complaint of nasal congestion, pressure in the maxillary sinuses, overall feeling of the throat, PND, frequent cough and left lower toothache that is radiating sharp pains into the left face. He is also complaining of bilateral ear discomfort. Denies fever. Her only medication taking this is Robitussin.   Past Medical History  Diagnosis Date  . MRSA infection   . Bipolar disorder    Past Surgical History  Procedure Laterality Date  . Tubal ligation     History reviewed. No pertinent family history. History  Substance Use Topics  . Smoking status: Current Every Day Smoker -- 1.00 packs/day    Types: Cigarettes  . Smokeless tobacco: Never Used  . Alcohol Use: No     Comment: in treatment    OB History   Grav Para Term Preterm Abortions TAB SAB Ect Mult Living                 Review of Systems  Constitutional: Positive for activity change. Negative for fever, chills, appetite change and fatigue.  HENT: Positive for congestion, dental problem, postnasal drip, rhinorrhea, sinus pressure, sneezing and sore throat. Negative for facial swelling.   Eyes: Negative.   Respiratory: Positive for cough. Negative for shortness of breath.   Cardiovascular: Negative.   Gastrointestinal: Negative.   Musculoskeletal: Negative for neck pain and neck stiffness.  Skin: Negative for pallor and rash.  Neurological: Negative.     Allergies  Review of patient's allergies indicates no known allergies.  Home Medications   Current Outpatient Rx  Name  Route  Sig  Dispense  Refill  . albuterol (PROVENTIL HFA;VENTOLIN HFA) 108 (90  BASE) MCG/ACT inhaler   Inhalation   Inhale 2 puffs into the lungs every 4 (four) hours as needed for wheezing or shortness of breath.   1 Inhaler   0   . Chlorphen-PE-Acetaminophen (TYLENOL ALLERGY MULTI-SYMPTOM PO)   Oral   Take 2 tablets by mouth daily as needed (for cold symptoms).         . cyclobenzaprine (FLEXERIL) 10 MG tablet   Oral   Take 1 tablet (10 mg total) by mouth 2 (two) times daily as needed for muscle spasms.   20 tablet   0   . divalproex (DEPAKOTE ER) 500 MG 24 hr tablet   Oral   Take 500 mg by mouth daily.         Marland Kitchen gabapentin (NEURONTIN) 300 MG capsule   Oral   Take 300 mg by mouth 3 (three) times daily.         Marland Kitchen guaiFENesin-codeine (CHERATUSSIN AC) 100-10 MG/5ML syrup   Oral   Take 5 mLs by mouth every 4 (four) hours as needed for cough or congestion.   120 mL   0   . HYDROcodone-acetaminophen (NORCO/VICODIN) 5-325 MG per tablet   Oral   Take 1 tablet by mouth every 4 (four) hours as needed.   15 tablet   0    BP 119/73  Pulse 103  Temp(Src) 98.4 F (36.9 C) (Oral)  Resp 20  SpO2 100%  LMP 05/12/2013 Physical Exam  Nursing note and vitals reviewed. Constitutional: She  is oriented to person, place, and time. She appears well-developed and well-nourished. No distress.  HENT:  Bilateral TMs are retracted but with no erythema. Oropharynx with minor erythema, cobblestoning and clear PND. No exudates or swelling. Dental tenderness to the left lower posterior-most third molar. There are cavernous lesions.  Eyes: EOM are normal.  Lower lid conjunctival erythema  Neck: Normal range of motion. Neck supple.  Cardiovascular: Normal rate, regular rhythm and normal heart sounds.   Pulmonary/Chest: Effort normal and breath sounds normal. No respiratory distress. She has no rales.  Prolonged expiratory phase. Rare forced expiratory wheeze.  Musculoskeletal: Normal range of motion. She exhibits no edema.  Lymphadenopathy:    She has no cervical  adenopathy.  Neurological: She is alert and oriented to person, place, and time.  Skin: Skin is warm and dry. No rash noted.  Psychiatric: She has a normal mood and affect.    ED Course  Procedures (including critical care time) Labs Review Labs Reviewed - No data to display Imaging Review No results found.   MDM   1. Allergic rhinosinusitis   2. RAD (reactive airway disease) with wheezing   3. Pain, dental   4. Tobacco dependency     See PCP/dentist ASAP Flonase, nasal saline Allegra Claritin , sudafed PE Cheratussin A/C Albuterol HFA. Written instructins.  Stop smoking    Hayden Rasmussenavid Cauy Melody, NP 06/02/13 1042

## 2013-06-02 NOTE — Discharge Instructions (Signed)
Allergic Rhinitis Saline nasal spray flonase nasal spray Allegra 180 mg a day or claritin 10 mg Sudafed pe 10 mg every 4 hours as needed Bronchospasm, Adult A bronchospasm is a spasm or tightening of the airways going into the lungs. During a bronchospasm breathing becomes more difficult because the airways get smaller. When this happens there can be coughing, a whistling sound when breathing (wheezing), and difficulty breathing. Bronchospasm is often associated with asthma, but not all patients who experience a bronchospasm have asthma. CAUSES  A bronchospasm is caused by inflammation or irritation of the airways. The inflammation or irritation may be triggered by:   Allergies (such as to animals, pollen, food, or mold). Allergens that cause bronchospasm may cause wheezing immediately after exposure or many hours later.   Infection. Viral infections are believed to be the most common cause of bronchospasm.   Exercise.   Irritants (such as pollution, cigarette smoke, strong odors, aerosol sprays, and paint fumes).   Weather changes. Winds increase molds and pollens in the air. Rain refreshes the air by washing irritants out. Cold air may cause inflammation.   Stress and emotional upset.  SIGNS AND SYMPTOMS   Wheezing.   Excessive nighttime coughing.   Frequent or severe coughing with a simple cold.   Chest tightness.   Shortness of breath.  DIAGNOSIS  Bronchospasm is usually diagnosed through a history and physical exam. Tests, such as chest X-rays, are sometimes done to look for other conditions. TREATMENT   Inhaled medicines can be given to open up your airways and help you breathe. The medicines can be given using either an inhaler or a nebulizer machine.  Corticosteroid medicines may be given for severe bronchospasm, usually when it is associated with asthma. HOME CARE INSTRUCTIONS   Always have a plan prepared for seeking medical care. Know when to call your  health care provider and local emergency services (911 in the [place] Know where you can access local emergency care.  Only take medicines as directed by your health care provider.  If you were prescribed an inhaler or nebulizer machine, ask your health care provider to explain how to use it correctly. Always use a spacer with your inhaler if you were given one.  It is necessary to remain calm during an attack. Try to relax and breathe more slowly.  Control your home environment in the following ways:   Change your heating and air conditioning filter at least once a month.   Limit your use of fireplaces and wood stoves.  Do not smoke and do not allow smoking in your home.   Avoid exposure to perfumes and fragrances.   Get rid of pests (such as roaches and mice) and their droppings.   Throw away plants if you see mold on them.   Keep your house clean and dust free.   Replace carpet with wood, tile, or vinyl flooring. Carpet can trap dander and dust.   Use allergy-proof pillows, mattress covers, and box spring covers.   Wash bed sheets and blankets every week in hot water and dry them in a dryer.   Use blankets that are made of polyester or cotton.   Wash hands frequently. SEEK MEDICAL CARE IF:   You have muscle aches.   You have chest pain.   The sputum changes from clear or white to yellow, green, gray, or bloody.   The sputum you cough up gets thicker.   There are problems that may be related to the medicine  you are given, such as a rash, itching, swelling, or trouble breathing.  SEEK IMMEDIATE MEDICAL CARE IF:   You have worsening wheezing and coughing even after taking your prescribed medicines.   You have increased difficulty breathing.   You develop severe chest pain. MAKE SURE YOU:   Understand these instructions.  Will watch your condition.  Will get help right away if you are not doing well or get worse. Document Released: 02/15/2003  Document Revised: 10/15/2012 Document Reviewed: 08/04/2012 [facility] Patient Information 2014 [place], [place].  Cough, Adult  A cough is a reflex that helps clear your throat and airways. It can help heal the body or may be a reaction to an irritated airway. A cough may only last 2 or 3 weeks (acute) or may last more than 8 weeks (chronic).  CAUSES Acute cough:  Viral or bacterial infections. Chronic cough:  Infections.  Allergies.  Asthma.  Post-nasal drip.  Smoking.  Heartburn or acid reflux.  Some medicines.  Chronic lung problems (COPD).  Cancer. SYMPTOMS   Cough.  Fever.  Chest pain.  Increased breathing rate.  High-pitched whistling sound when breathing (wheezing).  Colored mucus that you cough up (sputum). TREATMENT   A bacterial cough may be treated with antibiotic medicine.  A viral cough must run its course and will not respond to antibiotics.  Your caregiver may recommend other treatments if you have a chronic cough. HOME CARE INSTRUCTIONS   Only take over-the-counter or prescription medicines for pain, discomfort, or fever as directed by your caregiver. Use cough suppressants only as directed by your caregiver.  Use a cold steam vaporizer or humidifier in your bedroom or home to help loosen secretions.  Sleep in a semi-upright position if your cough is worse at night.  Rest as needed.  Stop smoking if you smoke. SEEK IMMEDIATE MEDICAL CARE IF:   You have pus in your sputum.  Your cough starts to worsen.  You cannot control your cough with suppressants and are losing sleep.  You begin coughing up blood.  You have difficulty breathing.  You develop pain which is getting worse or is uncontrolled with medicine.  You have a fever. MAKE SURE YOU:   Understand these instructions.  Will watch your condition.  Will get help right away if you are not doing well or get worse. Document Released: 08/11/2010 Document Revised: 05/07/2011  Document Reviewed: 08/11/2010 [facility] [place] [facility] Patient Information 2014 [place], [place].  Dental Pain Toothache is pain in or around a tooth. It may get worse with chewing or with cold or heat.  HOME CARE  Your dentist may use a numbing medicine during treatment. If so, you may need to avoid eating until the medicine wears off. Ask your dentist about this.  Only take medicine as told by your dentist or doctor.  Avoid chewing food near the painful tooth until after all treatment is done. Ask your dentist about this. GET HELP RIGHT AWAY IF:   The problem gets worse or new problems appear.  You have a fever.  There is redness and puffiness (swelling) of the face, jaw, or neck.  You cannot open your mouth.  There is pain in the jaw.  There is very bad pain that is not helped by medicine. MAKE SURE YOU:   Understand these instructions.  Will watch your condition.  Will get help right away if you are not doing well or get worse. Document Released: 08/01/2007 Document Revised: 05/07/2011 Document Reviewed: 08/01/2007 Exit[employer] Patient Information 2014 [place].  How to Use an Inhaler Using your inhaler correctly is very important. Good technique will make sure that the medicine reaches your lungs.  HOW TO USE AN INHALER: 1. Take the cap off the inhaler. 2. If this is the first time using your inhaler, you need to prime it. Shake the inhaler for 5 seconds. Release four puffs into the air, away from your face. Ask your doctor for help if you have questions. 3. Shake the inhaler for 5 seconds. 4. Turn the inhaler so the bottle is above the mouthpiece. 5. Put your pointer finger on top of the bottle. Your thumb holds the bottom of the inhaler. 6. Open your mouth. 7. Either hold the inhaler away from your mouth (the width of 2 fingers) or place your lips tightly around the mouthpiece. Ask your doctor which way to use your inhaler. 8. Breathe out as much air as possible. 9. Breathe in  and push down on the bottle 1 time to release the medicine. You will feel the medicine go in your mouth and throat. 10. Continue to take a deep breath in very slowly. Try to fill your lungs. 11. After you have breathed in completely, hold your breath for 10 seconds. This will help the medicine to settle in your lungs. If you cannot hold your breath for 10 seconds, hold it for as long as you can before you breathe out. 12. Breathe out slowly, through pursed lips. Whistling is an example of pursed lips. 13. If your doctor has told you to take more than 1 puff, wait at least 15 30 seconds between puffs. This will help you get the best results from your medicine. Do not use the inhaler more than your doctor tells you to. 14. Put the cap back on the inhaler. 15. Follow the directions from your doctor or from the inhaler package about cleaning the inhaler. If you use more than one inhaler, ask your doctor which inhalers to use and what order to use them in. Ask your doctor to help you figure out when you will need to refill your inhaler.  If you use a steroid inhaler, always rinse your mouth with water after your last puff, gargle and spit out the water. Do not swallow the water. GET HELP IF:  The inhaler medicine only partially helps to stop wheezing or shortness of breath.  You are having trouble using your inhaler.  You have some increase in thick spit (phlegm). GET HELP RIGHT AWAY IF:  The inhaler medicine does not help your wheezing or shortness of breath or you have tightness in your chest.  You have dizziness, headaches, or fast heart rate.  You have chills, fever, or night sweats.  You have a large increase of thick spit, or your thick spit is bloody. MAKE SURE YOU:   Understand these instructions.  Will watch your condition.  Will get help right away if you are not doing well or get worse. Document Released: 11/22/2007 Document Revised: 12/03/2012 Document Reviewed:  09/11/2012 [facility] Patient Information 2014 [place], [place].  Nicotine Addiction Nicotine can act as both a stimulant (excites/activates) and a sedative (calms/quiets). Immediately after exposure to nicotine, there is a "kick" caused in part by the drug's stimulation of the adrenal glands and resulting discharge of adrenaline (epinephrine). The rush of adrenaline stimulates the body and causes a sudden release of sugar. This means that smokers are always slightly hyperglycemic. Hyperglycemic means that the blood sugar is high, just like in diabetics.  Nicotine also decreases the amount of insulin which helps control sugar levels in the body. There is an increase in blood pressure, breathing, and the rate of heart beats.  In addition, nicotine indirectly causes a release of dopamine in the brain that controls pleasure and motivation. A similar reaction is seen with other drugs of abuse, such as cocaine and heroin. This dopamine release is thought to cause the pleasurable sensations when smoking. In some different cases, nicotine can also create a calming effect, depending on sensitivity of the smoker's nervous system and the dose of nicotine taken. WHAT HAPPENS WHEN NICOTINE IS TAKEN FOR LONG PERIODS OF TIME?  Long-term use of nicotine results in addiction. It is difficult to stop.  Repeated use of nicotine creates tolerance. Higher doses of nicotine are needed to get the "kick." When nicotine use is stopped, withdrawal may last a month or more. Withdrawal may begin within a few hours after the last cigarette. Symptoms peak within the first few days and may lessen within a few weeks. For some people, however, symptoms may last for months or longer. Withdrawal symptoms include:   Irritability.  Craving.  Learning and attention deficits.  Sleep disturbances.  Increased appetite. Craving for tobacco may last for 6 months or longer. Many behaviors done while using nicotine can also play a part in  the severity of withdrawal symptoms. For some people, the feel, smell, and sight of a cigarette and the ritual of obtaining, handling, lighting, and smoking the cigarette are closely linked with the pleasure of smoking. When stopped, they also miss the related behaviors which make the withdrawal or craving worse. While nicotine gum and patches may lessen the drug aspects of withdrawal, cravings often persist. WHAT ARE THE MEDICAL CONSEQUENCES OF NICOTINE USE?  Nicotine addiction accounts for one-third of all cancers. The top cancer caused by tobacco is lung cancer. Lung cancer is the number one cancer killer of both men and women.  Smoking is also associated with cancers of the:  Mouth.  Pharynx.  Larynx.  Esophagus.  Stomach.  Pancreas.  Cervix.  Kidney.  Ureter.  Bladder.  Smoking also causes lung diseases such as lasting (chronic) bronchitis and emphysema.  It worsens asthma in adults and children.  Smoking increases the risk of heart disease, including:  Stroke.  Heart attack.  Vascular disease.  Aneurysm.  Passive or secondary smoke can also increase medical risks including:  Asthma in children.  Sudden Infant Death Syndrome (SIDS).  Additionally, dropped cigarettes are the leading cause of residential fire fatalities.  Nicotine poisoning has been reported from accidental ingestion of tobacco products by children and pets. Death usually results in a few minutes from respiratory failure (when a person stops breathing) caused by paralysis. TREATMENT   Medication. Nicotine replacement medicines such as nicotine gum and the patch are used to stop smoking. These medicines gradually lower the dosage of nicotine in the body. These medicines do not contain the carbon monoxide and other toxins found in tobacco smoke.  Hypnotherapy.  Relaxation therapy.  Nicotine Anonymous (a 12-step support program). Find times and locations in your local yellow pages. Document  Released: 10/19/2003 Document Revised: 05/07/2011 Document Reviewed: 03/12/2007 [facility] Patient Information 2014 [place], [place].  Toothache Toothaches are usually caused by tooth decay (cavity). However, other causes of toothache include:  Gum disease.  Cracked tooth.  Cracked filling.  Injury.  Jaw problem (temporo mandibular joint or TMJ disorder).  Tooth abscess.  Root sensitivity.  Grinding.  Eruption problems. Swelling  and redness around a painful tooth often means you have a dental abscess. Pain medicine and antibiotics can help reduce symptoms, but you will need to see a dentist within the next few days to have your problem properly evaluated and treated. If tooth decay is the problem, you may need a filling or root canal to save your tooth. If the problem is more severe, your tooth may need to be pulled. SEEK IMMEDIATE MEDICAL CARE IF:  You cannot swallow.  You develop severe swelling, increased redness, or increased pain in your mouth or face.  You have a fever.  You cannot open your mouth adequately. Document Released: 03/22/2004 Document Revised: 05/07/2011 Document Reviewed: 05/12/2009 [facility] Patient Information 2014 [place], [place].  Allergic rhinitis is when the mucous membranes in the nose respond to allergens. Allergens are particles in the air that cause your body to have an allergic reaction. This causes you to release allergic antibodies. Through a chain of events, these eventually cause you to release histamine into the blood stream. Although meant to protect the body, it is this release of histamine that causes your discomfort, such as frequent sneezing, congestion, and an itchy, runny nose.  CAUSES  Seasonal allergic rhinitis (hay fever) is caused by pollen allergens that may come from grasses, trees, and weeds. Year-round allergic rhinitis (perennial allergic rhinitis) is caused by allergens such as house dust mites, pet dander, and mold spores.   SYMPTOMS   Nasal stuffiness (congestion).  Itchy, runny nose with sneezing and tearing of the eyes. DIAGNOSIS  Your health care provider can help you determine the allergen or allergens that trigger your symptoms. If you and your health care provider are unable to determine the allergen, skin or blood testing may be used. TREATMENT  Allergic Rhinitis does not have a cure, but it can be controlled by:  Medicines and allergy shots (immunotherapy).  Avoiding the allergen. Hay fever may often be treated with antihistamines in pill or nasal spray forms. Antihistamines block the effects of histamine. There are over-the-counter medicines that may help with nasal congestion and swelling around the eyes. Check with your health care provider before taking or giving this medicine.  If avoiding the allergen or the medicine prescribed do not work, there are many new medicines your health care provider can prescribe. Stronger medicine may be used if initial measures are ineffective. Desensitizing injections can be used if medicine and avoidance does not work. Desensitization is when a patient is given ongoing shots until the body becomes less sensitive to the allergen. Make sure you follow up with your health care provider if problems continue. HOME CARE INSTRUCTIONS It is not possible to completely avoid allergens, but you can reduce your symptoms by taking steps to limit your exposure to them. It helps to know exactly what you are allergic to so that you can avoid your specific triggers. SEEK MEDICAL CARE IF:   You have a fever.  You develop a cough that does not stop easily (persistent).  You have shortness of breath.  You start wheezing.  Symptoms interfere with normal daily activities. Document Released: 11/07/2000 Document Revised: 12/03/2012 Document Reviewed: 10/20/2012 [facility] Patient Information 2014 [place], [place].

## 2013-06-02 NOTE — ED Provider Notes (Signed)
Medical screening examination/treatment/procedure(s) were performed by resident physician or non-physician practitioner and as supervising physician I was immediately available for consultation/collaboration.   Barkley BrunsKINDL,Nealie Mchatton DOUGLAS MD.   Linna HoffJames D Daniela Hernan, MD 06/02/13 [phone]

## 2013-06-07 ENCOUNTER — Emergency Department (HOSPITAL_COMMUNITY)
Admission: EM | Admit: 2013-06-07 | Discharge: 2013-06-07 | Disposition: A | Discharge status: Home / Self Care | Payer: No Typology Code available for payment source | Attending: Emergency Medicine | Admitting: Emergency Medicine

## 2013-06-07 ENCOUNTER — Emergency Department (HOSPITAL_COMMUNITY): Payer: No Typology Code available for payment source

## 2013-06-07 ENCOUNTER — Encounter (HOSPITAL_COMMUNITY): Payer: Self-pay | Admitting: Emergency Medicine

## 2013-06-07 DIAGNOSIS — S6980XA Other specified injuries of unspecified wrist, hand and finger(s), initial encounter: Secondary | ICD-10-CM | POA: Insufficient documentation

## 2013-06-07 DIAGNOSIS — IMO0002 Reserved for concepts with insufficient information to code with codable children: Secondary | ICD-10-CM

## 2013-06-07 DIAGNOSIS — F319 Bipolar disorder, unspecified: Secondary | ICD-10-CM | POA: Insufficient documentation

## 2013-06-07 DIAGNOSIS — F17200 Nicotine dependence, unspecified, uncomplicated: Secondary | ICD-10-CM | POA: Insufficient documentation

## 2013-06-07 DIAGNOSIS — S6990XA Unspecified injury of unspecified wrist, hand and finger(s), initial encounter: Principal | ICD-10-CM | POA: Insufficient documentation

## 2013-06-07 DIAGNOSIS — M25449 Effusion, unspecified hand: Secondary | ICD-10-CM | POA: Insufficient documentation

## 2013-06-07 DIAGNOSIS — Z79899 Other long term (current) drug therapy: Secondary | ICD-10-CM | POA: Insufficient documentation

## 2013-06-07 DIAGNOSIS — M79645 Pain in left finger(s): Secondary | ICD-10-CM

## 2013-06-07 DIAGNOSIS — F411 Generalized anxiety disorder: Secondary | ICD-10-CM | POA: Insufficient documentation

## 2013-06-07 MED ORDER — HYDROCODONE-ACETAMINOPHEN 5-325 MG PO TABS
2.0000 | ORAL_TABLET | Freq: Once | ORAL | Status: AC
  Administered 2013-06-07: 2 via ORAL
  Filled 2013-06-07: qty 2

## 2013-06-07 MED ORDER — ALPRAZOLAM 0.25 MG PO TABS
0.2500 mg | ORAL_TABLET | Freq: Once | ORAL | Status: AC
  Administered 2013-06-07: 0.25 mg via ORAL
  Filled 2013-06-07: qty 1

## 2013-06-07 MED ORDER — HYDROCODONE-ACETAMINOPHEN 5-325 MG PO TABS: 1.0000 | ORAL_TABLET | ORAL | Status: DC | PRN

## 2013-06-07 MED ORDER — IBUPROFEN 400 MG PO TABS
600.0000 mg | ORAL_TABLET | Freq: Once | ORAL | Status: AC
  Administered 2013-06-07: 600 mg via ORAL
  Filled 2013-06-07 (×2): qty 1

## 2013-06-07 MED ORDER — ALPRAZOLAM 0.25 MG PO TABS: 0.2500 mg | ORAL_TABLET | Freq: Every evening | ORAL | Status: DC | PRN

## 2013-06-07 NOTE — Discharge Instructions (Signed)
Assault, General  Assault includes any behavior, whether intentional or reckless, which results in bodily injury to another person and/or damage to property. Included in this would be any behavior, intentional or reckless, that by its nature would be understood (interpreted) by a reasonable person as intent to harm another person or to damage his/her property. Threats may be oral or written. They may be communicated through regular mail, computer, fax, or phone. These threats may be direct or implied.  FORMS OF ASSAULT INCLUDE:  · Physically assaulting a person. This includes physical threats to inflict physical harm as well as:  · Slapping.  · Hitting.  · Poking.  · Kicking.  · Punching.  · Pushing.  · Arson.  · Sabotage.  · Equipment vandalism.  · Damaging or destroying property.  · Throwing or hitting objects.  · Displaying a weapon or an object that appears to be a weapon in a threatening manner.  · Carrying a firearm of any kind.  · Using a weapon to harm someone.  · Using greater physical size/strength to intimidate another.  · Making intimidating or threatening gestures.  · Bullying.  · Hazing.  · Intimidating, threatening, hostile, or abusive language directed toward another person.  · It communicates the intention to engage in violence against that person. And it leads a reasonable person to expect that violent behavior may occur.  · Stalking another person.  IF IT HAPPENS AGAIN:  · Immediately call for emergency help (911 in [place]  · If someone poses clear and immediate danger to you, seek legal authorities to have a protective or restraining order put in place.  · Less threatening assaults can at least be reported to authorities.  STEPS TO TAKE IF A SEXUAL ASSAULT HAS HAPPENED  · Go to an area of safety. This may include a shelter or staying with a friend. Stay away from the area where you have been attacked. A large percentage of sexual assaults are caused by a friend, relative or associate.  · If  medications were given by your caregiver, take them as directed for the full length of time prescribed.  · Only take over-the-counter or prescription medicines for pain, discomfort, or fever as directed by your caregiver.  · If you have come in contact with a sexual disease, find out if you are to be tested again. If your caregiver is concerned about the HIV/AIDS virus, he/she may require you to have continued testing for several months.  · For the protection of your privacy, test results can not be given over the phone. Make sure you receive the results of your test. If your test results are not back during your visit, make an appointment with your caregiver to find out the results. Do not assume everything is normal if you have not heard from your caregiver or the medical facility. It is important for you to follow up on all of your test results.  · File appropriate papers with authorities. This is important in all assaults, even if it has occurred in a family or by a friend.  SEEK MEDICAL CARE IF:  · You have new problems because of your injuries.  · You have problems that may be because of the medicine you are taking, such as:  · Rash.  · Itching.  · Swelling.  · Trouble breathing.  · You develop belly (abdominal) pain, feel sick to your stomach (nausea) or are vomiting.  · You begin to run a temperature.  · You   need supportive care or referral to a rape crisis center. These are centers with trained personnel who can help you get through this ordeal.  SEEK IMMEDIATE MEDICAL CARE IF:  · You are afraid of being threatened, beaten, or abused. In [place] call 911.  · You receive new injuries related to abuse.  · You develop severe pain in any area injured in the assault or have any change in your condition that concerns you.  · You faint or lose consciousness.  · You develop chest pain or shortness of breath.  Document Released: 02/12/2005 Document Revised: 05/07/2011 Document Reviewed: 10/01/2007  Exit[employer]® Patient  Information ©2014 [employer], LLC.
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
## 2013-06-07 NOTE — ED Provider Notes (Signed)
Medical screening examination/treatment/procedure(s) were performed by non-physician practitioner and as supervising physician I was immediately available for consultation/collaboration.   EKG Interpretation None      Devoria AlbeIva Jaylee Freeze, MD, Armando GangFACEP   Ward GivensIva L Rewa Weissberg, MD 06/07/13 1515

## 2013-06-07 NOTE — ED Notes (Signed)
Patient discharged to home with family. NAD.  

## 2013-06-07 NOTE — Progress Notes (Signed)
Orthopedic Tech Progress Note Patient Details:  Monique FlemingsKimberly E Shackleton 09/05/1975 [id number] Velcro thumb spica applied Ortho Devices Type of Ortho Device: Thumb spica splint Ortho Device/Splint Location: Left UE Ortho Device/Splint Interventions: Application   Asia R Thompson 06/07/2013, 3:11 PM

## 2014-08-29 ENCOUNTER — Encounter (HOSPITAL_COMMUNITY): Payer: Self-pay | Admitting: Emergency Medicine

## 2014-08-29 ENCOUNTER — Inpatient Hospital Stay (HOSPITAL_COMMUNITY)
Admission: EM | Admit: 2014-08-29 | Discharge: 2014-09-06 | DRG: 907 | Disposition: A | Discharge status: Home / Self Care | Payer: Medicaid Other | Attending: Internal Medicine | Admitting: Internal Medicine

## 2014-08-29 ENCOUNTER — Emergency Department (HOSPITAL_COMMUNITY): Payer: Medicaid Other

## 2014-08-29 DIAGNOSIS — B9561 Methicillin susceptible Staphylococcus aureus infection as the cause of diseases classified elsewhere: Secondary | ICD-10-CM | POA: Diagnosis present

## 2014-08-29 DIAGNOSIS — N390 Urinary tract infection, site not specified: Secondary | ICD-10-CM | POA: Diagnosis present

## 2014-08-29 DIAGNOSIS — G92 Toxic encephalopathy: Secondary | ICD-10-CM | POA: Diagnosis present

## 2014-08-29 DIAGNOSIS — J9601 Acute respiratory failure with hypoxia: Secondary | ICD-10-CM | POA: Diagnosis present

## 2014-08-29 DIAGNOSIS — F1410 Cocaine abuse, uncomplicated: Secondary | ICD-10-CM | POA: Diagnosis present

## 2014-08-29 DIAGNOSIS — F1910 Other psychoactive substance abuse, uncomplicated: Secondary | ICD-10-CM | POA: Diagnosis present

## 2014-08-29 DIAGNOSIS — Y831 Surgical operation with implant of artificial internal device as the cause of abnormal reaction of the patient, or of later complication, without mention of misadventure at the time of the procedure: Secondary | ICD-10-CM | POA: Diagnosis present

## 2014-08-29 DIAGNOSIS — T50901A Poisoning by unspecified drugs, medicaments and biological substances, accidental (unintentional), initial encounter: Secondary | ICD-10-CM | POA: Diagnosis present

## 2014-08-29 DIAGNOSIS — L03115 Cellulitis of right lower limb: Secondary | ICD-10-CM | POA: Diagnosis present

## 2014-08-29 DIAGNOSIS — M009 Pyogenic arthritis, unspecified: Secondary | ICD-10-CM | POA: Diagnosis present

## 2014-08-29 DIAGNOSIS — G9340 Encephalopathy, unspecified: Secondary | ICD-10-CM | POA: Diagnosis present

## 2014-08-29 DIAGNOSIS — F17210 Nicotine dependence, cigarettes, uncomplicated: Secondary | ICD-10-CM | POA: Diagnosis present

## 2014-08-29 DIAGNOSIS — T84629A Infection and inflammatory reaction due to internal fixation device of unspecified bone of leg, initial encounter: Secondary | ICD-10-CM | POA: Diagnosis present

## 2014-08-29 DIAGNOSIS — M869 Osteomyelitis, unspecified: Secondary | ICD-10-CM

## 2014-08-29 DIAGNOSIS — R4182 Altered mental status, unspecified: Secondary | ICD-10-CM | POA: Diagnosis present

## 2014-08-29 DIAGNOSIS — Y929 Unspecified place or not applicable: Secondary | ICD-10-CM

## 2014-08-29 DIAGNOSIS — M86671 Other chronic osteomyelitis, right ankle and foot: Secondary | ICD-10-CM | POA: Diagnosis present

## 2014-08-29 LAB — CBC WITH DIFFERENTIAL/PLATELET
BASOS ABS: 0 10*3/uL (ref 0.0–0.1)
Basophils Relative: 0 % (ref 0–1)
Eosinophils Absolute: 0.1 10*3/uL (ref 0.0–0.7)
Eosinophils Relative: 1 % (ref 0–5)
HEMATOCRIT: 37.6 % (ref 36.0–46.0)
Hemoglobin: 11.8 g/dL — ABNORMAL LOW (ref 12.0–15.0)
Lymphocytes Relative: 20 % (ref 12–46)
Lymphs Abs: 2.1 10*3/uL (ref 0.7–4.0)
MCH: 25.4 pg — AB (ref 26.0–34.0)
MCHC: 31.4 g/dL (ref 30.0–36.0)
MCV: 80.9 fL (ref 78.0–100.0)
Monocytes Absolute: 1 10*3/uL (ref 0.1–1.0)
Monocytes Relative: 10 % (ref 3–12)
NEUTROS ABS: 7.1 10*3/uL (ref 1.7–7.7)
NEUTROS PCT: 69 % (ref 43–77)
Platelets: 432 10*3/uL — ABNORMAL HIGH (ref 150–400)
RBC: 4.65 MIL/uL (ref 3.87–5.11)
RDW: 15.3 % (ref 11.5–15.5)
WBC: 10.4 10*3/uL (ref 4.0–10.5)

## 2014-08-29 LAB — COMPREHENSIVE METABOLIC PANEL
ALBUMIN: 4.1 g/dL (ref 3.5–5.0)
ALK PHOS: 67 U/L (ref 38–126)
ALT: 23 U/L (ref 14–54)
AST: 40 U/L (ref 15–41)
Anion gap: 11 (ref 5–15)
BILIRUBIN TOTAL: 0.8 mg/dL (ref 0.3–1.2)
BUN: 17 mg/dL (ref 6–20)
CHLORIDE: 99 mmol/L — AB (ref 101–111)
CO2: 26 mmol/L (ref 22–32)
Calcium: 8.9 mg/dL (ref 8.9–10.3)
Creatinine, Ser: 0.87 mg/dL (ref 0.44–1.00)
GFR calc non Af Amer: >60 mL/min (ref >60)
GLUCOSE: 122 mg/dL — AB (ref 65–99)
Potassium: 4 mmol/L (ref 3.5–5.1)
SODIUM: 136 mmol/L (ref 135–145)
Total Protein: 8.1 g/dL (ref 6.5–8.1)

## 2014-08-29 LAB — RAPID URINE DRUG SCREEN, HOSP PERFORMED
Amphetamines: NOT DETECTED
Barbiturates: NOT DETECTED
Benzodiazepines: POSITIVE — AB
Cocaine: POSITIVE — AB
OPIATES: NOT DETECTED
TETRAHYDROCANNABINOL: POSITIVE — AB

## 2014-08-29 LAB — I-STAT CHEM 8, ED
BUN: 17 mg/dL (ref 6–20)
CREATININE: 0.9 mg/dL (ref 0.44–1.00)
Calcium, Ion: 1.14 mmol/L (ref 1.12–1.23)
Chloride: 101 mmol/L (ref 101–111)
Glucose, Bld: 124 mg/dL — ABNORMAL HIGH (ref 65–99)
HEMATOCRIT: 41 % (ref 36.0–46.0)
Hemoglobin: 13.9 g/dL (ref 12.0–15.0)
POTASSIUM: 3.6 mmol/L (ref 3.5–5.1)
Sodium: 137 mmol/L (ref 135–145)
TCO2: 25 mmol/L (ref 0–100)

## 2014-08-29 LAB — URINALYSIS, ROUTINE W REFLEX MICROSCOPIC
Glucose, UA: NEGATIVE mg/dL
Hgb urine dipstick: NEGATIVE
Ketones, ur: NEGATIVE mg/dL
Nitrite: POSITIVE — AB
Protein, ur: 100 mg/dL — AB
Specific Gravity, Urine: 1.029 (ref 1.005–1.030)
Urobilinogen, UA: 1 mg/dL (ref 0.0–1.0)
pH: 5.5 (ref 5.0–8.0)

## 2014-08-29 LAB — URINE MICROSCOPIC-ADD ON

## 2014-08-29 LAB — ETHANOL

## 2014-08-29 LAB — I-STAT CG4 LACTIC ACID, ED: LACTIC ACID, VENOUS: 1.21 mmol/L (ref 0.5–2.0)

## 2014-08-29 LAB — AMMONIA: Ammonia: 12 umol/L (ref 9–35)

## 2014-08-29 LAB — MRSA PCR SCREENING: MRSA by PCR: NEGATIVE

## 2014-08-29 LAB — PREGNANCY, URINE: Preg Test, Ur: NEGATIVE

## 2014-08-29 MED ORDER — SODIUM CHLORIDE 0.9 % IV SOLN
INTRAVENOUS | Status: DC
  Administered 2014-08-30: 05:00:00 via INTRAVENOUS
  Administered 2014-08-30: 1000 mL via INTRAVENOUS
  Administered 2014-08-31 – 2014-09-02 (×4): via INTRAVENOUS

## 2014-08-29 MED ORDER — ONDANSETRON HCL 4 MG PO TABS: 4.0000 mg | ORAL_TABLET | Freq: Four times a day (QID) | ORAL | Status: DC | PRN

## 2014-08-29 MED ORDER — NALOXONE HCL 1 MG/ML IJ SOLN
INTRAMUSCULAR | Status: AC
  Filled 2014-08-29: qty 2

## 2014-08-29 MED ORDER — DOXYCYCLINE HYCLATE 100 MG PO TABS
100.0000 mg | ORAL_TABLET | Freq: Two times a day (BID) | ORAL | Status: DC
  Administered 2014-08-29 – 2014-09-03 (×10): 100 mg via ORAL
  Filled 2014-08-29 (×11): qty 1

## 2014-08-29 MED ORDER — ONDANSETRON HCL 4 MG/2ML IJ SOLN: 4.0000 mg | Freq: Four times a day (QID) | INTRAMUSCULAR | Status: DC | PRN

## 2014-08-29 MED ORDER — ACETAMINOPHEN 650 MG RE SUPP: 650.0000 mg | Freq: Four times a day (QID) | RECTAL | Status: DC | PRN

## 2014-08-29 MED ORDER — SODIUM CHLORIDE 0.9 % IJ SOLN
3.0000 mL | Freq: Two times a day (BID) | INTRAMUSCULAR | Status: DC
  Administered 2014-09-01 – 2014-09-05 (×4): 3 mL via INTRAVENOUS

## 2014-08-29 MED ORDER — DEXTROSE 5 % IV SOLN
1.0000 g | Freq: Once | INTRAVENOUS | Status: AC
  Administered 2014-08-29: 1 g via INTRAVENOUS
  Filled 2014-08-29: qty 10

## 2014-08-29 MED ORDER — SODIUM CHLORIDE 0.9 % IV SOLN
INTRAVENOUS | Status: AC
  Administered 2014-08-29: 18:00:00 via INTRAVENOUS

## 2014-08-29 MED ORDER — LORAZEPAM 2 MG/ML IJ SOLN
1.0000 mg | Freq: Four times a day (QID) | INTRAMUSCULAR | Status: DC | PRN
Start: 2014-08-29 — End: 2014-09-04
  Administered 2014-08-30 – 2014-09-04 (×18): 1 mg via INTRAVENOUS
  Filled 2014-08-29 (×18): qty 1

## 2014-08-29 MED ORDER — OXYCODONE-ACETAMINOPHEN 5-325 MG PO TABS
1.0000 | ORAL_TABLET | Freq: Four times a day (QID) | ORAL | Status: DC | PRN
  Administered 2014-08-29: 1 via ORAL
  Filled 2014-08-29: qty 1

## 2014-08-29 MED ORDER — NALOXONE HCL 1 MG/ML IJ SOLN
2.0000 mg | Freq: Once | INTRAMUSCULAR | Status: AC
  Administered 2014-08-29: 2 mg via INTRAVENOUS

## 2014-08-29 MED ORDER — DEXTROSE 5 % IV SOLN
1.0000 g | INTRAVENOUS | Status: DC
  Administered 2014-08-30 – 2014-09-05 (×7): 1 g via INTRAVENOUS
  Filled 2014-08-29 (×7): qty 10

## 2014-08-29 MED ORDER — ACETAMINOPHEN 325 MG PO TABS
650.0000 mg | ORAL_TABLET | Freq: Four times a day (QID) | ORAL | Status: DC | PRN
  Administered 2014-09-02: 650 mg via ORAL
  Filled 2014-08-29: qty 2
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
## 2014-08-29 NOTE — ED Notes (Signed)
Warm blankets provided for patient.
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
## 2014-08-29 NOTE — ED Notes (Signed)
Pt removed O2 and will not keep in nose. Did not replace as pt is holding O2 sats normally at this time. Dr Rubin PayorPickering notified. Will continue to monitor

## 2014-08-29 NOTE — ED Notes (Signed)
Pt skin and nails unkempt. Pt bilateral arms, legs, abdomen have bruises and minor scratches. EDP made aware of pt GCS and condition
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
## 2014-08-29 NOTE — ED Notes (Signed)
Phlebotomy attempted to get blood w/o success. Pt is combative with any procedure

## 2014-08-29 NOTE — ED Notes (Signed)
Bed: WA16 Expected date:  Expected time:  Means of arrival:  Comments: EMS/AMS 

## 2014-08-30 ENCOUNTER — Inpatient Hospital Stay (HOSPITAL_COMMUNITY): Payer: Medicaid Other

## 2014-08-30 DIAGNOSIS — R4182 Altered mental status, unspecified: Secondary | ICD-10-CM

## 2014-08-30 LAB — CBC
HEMATOCRIT: 31.9 % — AB (ref 36.0–46.0)
HEMOGLOBIN: 9.7 g/dL — AB (ref 12.0–15.0)
MCH: 25.5 pg — AB (ref 26.0–34.0)
MCHC: 30.4 g/dL (ref 30.0–36.0)
MCV: 83.7 fL (ref 78.0–100.0)
Platelets: 340 10*3/uL (ref 150–400)
RBC: 3.81 MIL/uL — ABNORMAL LOW (ref 3.87–5.11)
RDW: 15.6 % — AB (ref 11.5–15.5)
WBC: 5.4 10*3/uL (ref 4.0–10.5)

## 2014-08-30 LAB — COMPREHENSIVE METABOLIC PANEL
ALBUMIN: 3.1 g/dL — AB (ref 3.5–5.0)
ALK PHOS: 53 U/L (ref 38–126)
ALT: 19 U/L (ref 14–54)
AST: 17 U/L (ref 15–41)
Anion gap: 6 (ref 5–15)
BUN: 15 mg/dL (ref 6–20)
CO2: 28 mmol/L (ref 22–32)
Calcium: 8.4 mg/dL — ABNORMAL LOW (ref 8.9–10.3)
Chloride: 105 mmol/L (ref 101–111)
Creatinine, Ser: 0.75 mg/dL (ref 0.44–1.00)
Glucose, Bld: 110 mg/dL — ABNORMAL HIGH (ref 65–99)
POTASSIUM: 3.8 mmol/L (ref 3.5–5.1)
SODIUM: 139 mmol/L (ref 135–145)
Total Bilirubin: 0.5 mg/dL (ref 0.3–1.2)
Total Protein: 6.2 g/dL — ABNORMAL LOW (ref 6.5–8.1)

## 2014-08-30 LAB — GLUCOSE, CAPILLARY: Glucose-Capillary: 96 mg/dL (ref 65–99)

## 2014-08-30 LAB — HIV ANTIBODY (ROUTINE TESTING W REFLEX): HIV Screen 4th Generation wRfx: NONREACTIVE

## 2014-08-30 LAB — TSH: TSH: 2.19 u[IU]/mL (ref 0.350–4.500)

## 2014-08-30 MED ORDER — FLUCONAZOLE 100 MG PO TABS
100.0000 mg | ORAL_TABLET | Freq: Every day | ORAL | Status: DC
  Administered 2014-08-30 – 2014-09-01 (×3): 100 mg via ORAL
  Filled 2014-08-30 (×4): qty 1

## 2014-08-30 MED ORDER — OXYCODONE-ACETAMINOPHEN 5-325 MG PO TABS
1.0000 | ORAL_TABLET | ORAL | Status: DC | PRN
  Administered 2014-08-30 (×2): 1 via ORAL
  Filled 2014-08-30 (×2): qty 1

## 2014-08-30 MED ORDER — DIPHENHYDRAMINE HCL 25 MG PO CAPS
25.0000 mg | ORAL_CAPSULE | Freq: Once | ORAL | Status: AC
  Administered 2014-08-30: 25 mg via ORAL
  Filled 2014-08-30: qty 1

## 2014-08-30 MED ORDER — SODIUM CHLORIDE 0.9 % IV BOLUS (SEPSIS)
1000.0000 mL | Freq: Once | INTRAVENOUS | Status: AC
  Administered 2014-08-30: 1000 mL via INTRAVENOUS

## 2014-08-30 MED ORDER — DIPHENHYDRAMINE HCL 25 MG PO CAPS
25.0000 mg | ORAL_CAPSULE | Freq: Four times a day (QID) | ORAL | Status: DC | PRN
  Administered 2014-08-30 – 2014-09-04 (×16): 25 mg via ORAL
  Filled 2014-08-30 (×16): qty 1

## 2014-08-30 MED ORDER — OXYCODONE-ACETAMINOPHEN 5-325 MG PO TABS
1.0000 | ORAL_TABLET | ORAL | Status: DC | PRN
  Administered 2014-08-30: 2 via ORAL
  Administered 2014-08-30: 1 via ORAL
  Administered 2014-08-30 – 2014-09-01 (×8): 2 via ORAL
  Administered 2014-09-01: 1 via ORAL
  Administered 2014-09-01 – 2014-09-02 (×6): 2 via ORAL
  Administered 2014-09-02 (×2): 1 via ORAL
  Filled 2014-08-30 (×18): qty 2
  Filled 2014-08-30: qty 1
  Filled 2014-08-30: qty 2

## 2014-08-30 NOTE — Progress Notes (Signed)
[facility] results. RN notified on call NP and gave NP extension to contact MD. Will continue to monitor pt closely. Mardene CelesteAsaro, Annelise Mccoy I

## 2014-08-30 NOTE — Progress Notes (Addendum)
[id number]/Rhonda Davis,RN,BSN,CCM: Patient calm.  Information and phone number to the wellness clinic given to patient, agrees to call for her follow-up appt. Post hospital stay so she will know time, date and transportation to the appt.  [id number]/Rhonda Davis,RN,BSN,CCM: Micah FlesherWent in to give patient information concerning [facility] patient agitated and yelling.  No information due to patient behavior.

## 2014-08-30 NOTE — Progress Notes (Signed)
Utilization review completed.  

## 2014-08-30 NOTE — Consult Note (Signed)
ORTHOPAEDIC CONSULTATION  REQUESTING PHYSICIAN: Alison MurrayAlma M Devine, MD  Chief Complaint: Right ankle pain  HPI: Monique [name] is a 39 y.o. female who presents with right ankle pain.  States this pain has been 10/10 pain for the last 8 months since she underwent ORIF by Dr. Samuel BoucheLucas in [place].  The ankle fracture was an open injury on the medial side.  Her postop course was complicated by infection that was treated with IV abx and PICC line.  She states she has a small pin sized wound laterally that erupts periodically and then heals.  She also states she goes to the ER "to get her regular abx" to suppress her chronic ankle infection.  Pain is worse with movement and ambulation.  Radiates up the leg.  Throbbing severe pain that's generalized around the ankle.  Denies f/c/n/v.  Ortho consulted.  Past Medical History  Diagnosis Date  . MRSA infection   . Bipolar disorder    Past Surgical History  Procedure Laterality Date  . Tubal ligation     History   Social History  . Marital Status: Single    Spouse Name: N/A  . Number of Children: N/A  . Years of Education: N/A   Social History Main Topics  . Smoking status: Current Every Day Smoker -- 1.00 packs/day    Types: Cigarettes  . Smokeless tobacco: Never Used  . Alcohol Use: 1.2 oz/week    2 Cans of beer per week     Comment: in treatment   . Drug Use: Yes    Special: Cocaine, Marijuana, Benzodiazepines     Comment: drug screen +  benzos,cocaine, marijuana pt denies using   . Sexual Activity: Yes    Birth Control/ Protection: None, Surgical   Other Topics Concern  . None   Social History Narrative   ** Merged History Encounter **       No family history on file. No Known Allergies Prior to Admission medications   Medication Sig Start Date End Date Taking? Authorizing Provider  albuterol (PROVENTIL HFA;VENTOLIN HFA) 108 (90 BASE) MCG/ACT inhaler Inhale 2 puffs into the lungs every 4 (four) hours as needed for  wheezing or shortness of breath. 06/02/13  Yes Hayden Rasmussenavid Mabe, NP  ALPRAZolam Prudy Feeler(XANAX) 0.25 MG tablet Take 1 tablet (0.25 mg total) by mouth at bedtime as needed for anxiety. 06/07/13  Yes Junius FinnerErin O'Malley, PA-C  HYDROcodone-acetaminophen (NORCO) 10-325 MG per tablet Take 1 tablet by mouth every 6 (six) hours as needed (For pain.).   Yes Historical Provider, MD  ibuprofen (ADVIL,MOTRIN) 200 MG tablet Take 200 mg by mouth every 6 (six) hours as needed (For ankle pain.).    Yes Historical Provider, MD   Dg Chest 1 View  08/29/2014   CLINICAL DATA:  Minimally responsive.  Altered mental status.  EXAM: CHEST  1 VIEW  COMPARISON:  06/02/2012  FINDINGS: Low lung volumes without focal chest disease. Heart and mediastinum are within normal limits. The trachea is midline. No acute bone abnormality. Negative for pneumothorax.  IMPRESSION: Low lung volumes without focal disease.   Electronically Signed   By: Richarda OverlieAdam  Henn M.D.   On: 08/29/2014 14:02   Dg Ankle 2 Views Right  08/30/2014   CLINICAL DATA:  Pain, swelling, redness.  Lateral wound.  EXAM: RIGHT ANKLE - 2 VIEW  COMPARISON:  08/29/2014  FINDINGS: Plate and screw fixation of the distal fibular shaft, with 2 screws extending across into the distal tibial metaphysis. There is lucency  around multiple screws. The inferior most screw has backed out at least 6 mm, as seen on previous exam. Paired screws are noted across the medial malleolus as well, intact without surrounding lucency. There is mild widening of the ankle mortise medially. No fracture or dislocation. Normal osseous mineralization elsewhere.  IMPRESSION: 1. Lucency around fibular fixation hardware suggesting loosening or infection.   Electronically Signed   By: Corlis Leak M.D.   On: 08/30/2014 09:42   Dg Ankle Complete Right  08/29/2014   CLINICAL DATA:  Fall today. Altered mental status. Patient minimally responsive.  EXAM: RIGHT ANKLE - COMPLETE 3+ VIEW  COMPARISON:  None.  FINDINGS: ORIF of the RIGHT ankle is  present. This is a bimalleolar fixation with lateral fibular plate and screws and syndesmotic screws. There is osteolysis around the syndesmotic screws. aThe most inferior lateral malleolar screw has backed out and the head is in the subcutaneous tissues. Smooth subperiosteal new bone is evident in the anterior tibial plafond on the lateral projection.  There is no acute ankle fracture. The ankle mortise is congruent. Likely old avulsion fracture from the tip of the medial malleolus.  IMPRESSION: No acute osseous abnormality. Old bimalleolar ORIF with loose hardware.   Electronically Signed   By: Andreas Newport M.D.   On: 08/29/2014 14:04   Ct Head Wo Contrast  08/29/2014   CLINICAL DATA:  Patient was found unconscious at a parking lot at a convenient store.  EXAM: CT HEAD WITHOUT CONTRAST  TECHNIQUE: Contiguous axial images were obtained from the base of the skull through the vertex without intravenous contrast.  COMPARISON:  May 25, 2012  FINDINGS: There is no midline shift, hydrocephalus, or mass. No acute hemorrhage or acute transcortical infarct is identified. Bony calvarium is intact. The visualized sinuses are clear.  IMPRESSION: No focal acute intracranial abnormality identified.   Electronically Signed   By: Sherian Rein M.D.   On: 08/29/2014 14:03    Positive ROS: All other systems have been reviewed and were otherwise negative with the exception of those mentioned in the HPI and as above.  Physical Exam: General: Alert, no acute distress Cardiovascular: No pedal edema Respiratory: No cyanosis, no use of accessory musculature GI: No organomegaly, abdomen is soft and non-tender Skin: Pin sized superficial wound with a pink base and scant purulence Neurologic: Sensation intact distally Psychiatric: Patient is competent for consent with normal mood and affect Lymphatic: No axillary or cervical lymphadenopathy  MUSCULOSKELETAL:  - post surgical scars - lateral ankle superficial wound,  pin sized - no cellulitis, erythema, warmth of ankle - endorses 10/10 pain although exam is not c/w what patient reports - foot wwp  Assessment: Chronic right ankle pain Likely chronically infected hardware 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 Samuel Bouche in [place] about patient's condition - he needs to be aware of this matter and should treat this patient for this complication - patient likely needs removal of all hardware and long term IV abx to eradicate infection - no indication to aspirate ankle  Thank you for the consult and the opportunity to see Ms. Brander  N. Glee Arvin, MD [facility] [phone] 10:52 PM

## 2014-08-30 NOTE — Progress Notes (Signed)
Patient ID: Monique [name], female   DOB: 03/18/1975, 39 y.o.   MRN: [id number] [facility] PROGRESS NOTE  Monique [name] NWG:[id number] DOB: December 02, 1975 DOA: 08/29/2014 PCP: Default, Provider, MD- she does not have PCP, pt reports she has no health insurance and therefore has no PCP. Will try to establish with [facility]  Brief narrative:    39 y.o. Female with past medical history of drug abuse, prostitution who presented to [facility] ED with altered mental status. She was found unresponsive while eating and when EMS arrived they gave her a narcan but with no significant changes in mental status.   In ED, she was found to have T of 99.5 F, HR 96, RR 25, BP 81/45, oxygen saturation of 89% on room air but this has improved with [place] oxygen support. UA showed small leukocytes and many bacteria and she was started on empiric rocephin for possible UTI. Alcohol level was WNL and UDS was positive for cocaine, benzos and THC. CT head did not show acute intracranial findings. Due to her initial presentation of unresponsiveness she was admitted to stepdown unit for first 24 hour monitoring.  Barrier to discharge: Patient has complaints of right ankle pain which was imaged yesterday with no acute findings but we have repeated the scan this morning and there is some concern for acute infection. She will need MRI to make sure this is not osteomyelitis. She is currently being treated for urinary tract infection with Rocephin and doxycycline for right lower leg cellulitis. She is stable for transfer to telemetry floor today.   Assessment/Plan:    Principal Problem:  Acute encephalopathy / Drug overdose / Drug abuse - UDS positive for cocaine, benzos and THC on admission - Patient has received Narcan by EMS but did not improve after that. She was lethargic and intermittently combative in ED. - This morning her mental status is much better, she is oriented to time, place and person. - Patient is medically stable  for transfer to telemetry floor today.  Active Problems:  Acute respiratory failure with hypoxia - Likely from drug overdose - Respiratory status stable.   UTI - UA on admission showed leukocytes and many bacteria - Continue empiric Rocephin. She also has doxycycline to cover for possible STDs and lower extremity cellulitis. - Follow up urine culture results.   RLE cellulitis / right ankle pain - X-ray of the right ankle on the admission did not reveal acute findings. We have repeated the study today and there is some concern for infection. - Patient reports that she had CT scan of the cervical not too long ago and [facility] and it was significant for osteomyelitis but no one wanted to do anything because she does not have insurance. - I will obtain MRI of the right ankle for further evaluation. - Doxycycline for now should be okay for the cellulitis.  DVT prophylaxis:  - SCD's bilaterally     Code Status: Full.  Family Communication:  plan of care discussed with the patient Disposition Plan: transfer to telemetry floor today   IV access:  Peripheral IV  Procedures and diagnostic studies:    Dg Chest 1 View 08/29/2014  Low lung volumes without focal disease.     Dg Ankle 2 Views Right 08/30/2014  1. Lucency around fibular fixation hardware suggesting loosening or infection.    Dg Ankle Complete Right 08/29/2014   No acute osseous abnormality. Old bimalleolar ORIF with loose hardware.    Ct Head Wo Contrast  08/29/2014    No focal acute intracranial abnormality identified.   Electronically Signed   By: Sherian ReinWei-Chen  Lin M.D.   On: 08/29/2014 14:03    Medical Consultants:  None   Other Consultants:  None   IAnti-Infectives:   Rocephin 08/29/2014 --> Doxycycline 08/29/2014 -->    Manson PasseyEVINE, Monique Popowski, MD  [facility] Pager [phone]  Time spent in minutes: 25 minutes  If 7PM-7AM, please contact night-coverage [email or web address] Password [email or web address] 08/30/2014, 10:40 AM   LOS: 1  day    HPI/Subjective: No acute overnight events. Patient reports pain in right ankle.  Objective: Filed Vitals:   08/30/14 0800 08/30/14 0830 08/30/14 0900 08/30/14 1000  BP: 105/45 106/59 94/68   Pulse: 71 74 78 82  Temp: 97.6 F (36.4 C)     TempSrc: Oral     Resp: 18 17 16 26   Height:      Weight:      SpO2: 99% 98% 99% 96%    Intake/Output Summary (Last 24 hours) at 08/30/14 1040 Last data filed at 08/30/14 1000  Gross per 24 hour  Intake   2550 ml  Output    750 ml  Net   1800 ml    Exam:   General:  Pt is alert, follows commands appropriately, not in acute distress  Cardiovascular: Regular rate and rhythm, S1/S2, no murmurs  Respiratory: Clear to auscultation bilaterally, no wheezing, no crackles, no rhonchi  Abdomen: Soft, non tender, non distended, bowel sounds present  Extremities: right ankle less red but swollen, pulses DP and PT palpable bilaterally  Neuro: Grossly nonfocal  Data Reviewed: Basic Metabolic Panel:  Recent Labs Lab 08/29/14 1334 08/29/14 1347 08/30/14 0350  NA 136 137 139  K 4.0 3.6 3.8  CL 99* 101 105  CO2 26  --  28  GLUCOSE 122* 124* 110*  BUN 17 17 15   CREATININE 0.87 0.90 0.75  CALCIUM 8.9  --  8.4*   Liver Function Tests:  Recent Labs Lab 08/29/14 1334 08/30/14 0350  AST 40 17  ALT 23 19  ALKPHOS 67 53  BILITOT 0.8 0.5  PROT 8.1 6.2*  ALBUMIN 4.1 3.1*   No results for input(s): LIPASE, AMYLASE in the last 168 hours.  Recent Labs Lab 08/29/14 1329  AMMONIA 12   CBC:  Recent Labs Lab 08/29/14 1334 08/29/14 1347 08/30/14 0350  WBC 10.4  --  5.4  NEUTROABS 7.1  --   --   HGB 11.8* 13.9 9.7*  HCT 37.6 41.0 31.9*  MCV 80.9  --  83.7  PLT 432*  --  340   Cardiac Enzymes: No results for input(s): CKTOTAL, CKMB, CKMBINDEX, TROPONINI in the last 168 hours. BNP: Invalid input(s): POCBNP CBG:  Recent Labs Lab 08/30/14 0803  GLUCAP 96    Recent Results (from the past 240 hour(s))  MRSA PCR  Screening     Status: None   Collection Time: 08/29/14  5:15 PM  Result Value Ref Range Status   MRSA by PCR NEGATIVE NEGATIVE Final     Scheduled Meds: . cefTRIAXone (ROCEPHIN)  IV  1 g Intravenous Q24H  . doxycycline  100 mg Oral Q12H  . sodium chloride  3 mL Intravenous Q12H   Continuous Infusions: . sodium chloride 75 mL/hr at 08/30/14 0526

## 2014-08-30 NOTE — Progress Notes (Signed)
Monique [name] 1226 TRANSFERRED TO 1423- REPORT GIVEN TO KATIE RN. NO CHANGE IN CONDITION APPARENT.

## 2014-08-31 ENCOUNTER — Other Ambulatory Visit (HOSPITAL_COMMUNITY): Payer: Self-pay | Admitting: Orthopedic Surgery

## 2014-08-31 DIAGNOSIS — M869 Osteomyelitis, unspecified: Secondary | ICD-10-CM

## 2014-08-31 DIAGNOSIS — M009 Pyogenic arthritis, unspecified: Secondary | ICD-10-CM

## 2014-08-31 LAB — CBC WITH DIFFERENTIAL/PLATELET
BASOS ABS: 0 10*3/uL (ref 0.0–0.1)
Basophils Relative: 0 % (ref 0–1)
EOS PCT: 2 % (ref 0–5)
Eosinophils Absolute: 0.1 10*3/uL (ref 0.0–0.7)
HCT: 33.8 % — ABNORMAL LOW (ref 36.0–46.0)
HEMOGLOBIN: 10.3 g/dL — AB (ref 12.0–15.0)
LYMPHS ABS: 2.7 10*3/uL (ref 0.7–4.0)
LYMPHS PCT: 42 % (ref 12–46)
MCH: 25.7 pg — ABNORMAL LOW (ref 26.0–34.0)
MCHC: 30.5 g/dL (ref 30.0–36.0)
MCV: 84.3 fL (ref 78.0–100.0)
Monocytes Absolute: 0.4 10*3/uL (ref 0.1–1.0)
Monocytes Relative: 6 % (ref 3–12)
Neutro Abs: 3.2 10*3/uL (ref 1.7–7.7)
Neutrophils Relative %: 50 % (ref 43–77)
Platelets: 372 10*3/uL (ref 150–400)
RBC: 4.01 MIL/uL (ref 3.87–5.11)
RDW: 15.7 % — ABNORMAL HIGH (ref 11.5–15.5)
WBC: 6.4 10*3/uL (ref 4.0–10.5)

## 2014-08-31 LAB — COMPREHENSIVE METABOLIC PANEL
ALBUMIN: 3.4 g/dL — AB (ref 3.5–5.0)
ALT: 18 U/L (ref 14–54)
AST: 15 U/L (ref 15–41)
Alkaline Phosphatase: 50 U/L (ref 38–126)
Anion gap: 7 (ref 5–15)
BUN: 12 mg/dL (ref 6–20)
CO2: 26 mmol/L (ref 22–32)
Calcium: 8.8 mg/dL — ABNORMAL LOW (ref 8.9–10.3)
Chloride: 103 mmol/L (ref 101–111)
Creatinine, Ser: 0.8 mg/dL (ref 0.44–1.00)
Glucose, Bld: 114 mg/dL — ABNORMAL HIGH (ref 65–99)
POTASSIUM: 4 mmol/L (ref 3.5–5.1)
SODIUM: 136 mmol/L (ref 135–145)
TOTAL PROTEIN: 7.1 g/dL (ref 6.5–8.1)
Total Bilirubin: 0.2 mg/dL — ABNORMAL LOW (ref 0.3–1.2)

## 2014-08-31 LAB — TYPE AND SCREEN
ABO/RH(D): O POS
ANTIBODY SCREEN: NEGATIVE

## 2014-08-31 LAB — GLUCOSE, CAPILLARY: GLUCOSE-CAPILLARY: 92 mg/dL (ref 65–99)

## 2014-08-31 LAB — HIV ANTIBODY (ROUTINE TESTING W REFLEX): HIV Screen 4th Generation wRfx: NONREACTIVE

## 2014-08-31 LAB — PROTIME-INR
INR: 0.9 (ref 0.00–1.49)
Prothrombin Time: 12.3 seconds (ref 11.6–15.2)

## 2014-08-31 LAB — ABO/RH: ABO/RH(D): O POS

## 2014-08-31 MED ORDER — PERMETHRIN 5 % EX CREA
TOPICAL_CREAM | Freq: Once | CUTANEOUS | Status: AC
  Administered 2014-08-31: 11:00:00 via TOPICAL
  Filled 2014-08-31: qty 60

## 2014-08-31 MED ORDER — GI COCKTAIL ~~LOC~~
30.0000 mL | Freq: Once | ORAL | Status: AC
  Administered 2014-08-31: 30 mL via ORAL
  Filled 2014-08-31: qty 30

## 2014-08-31 MED ORDER — DIPHENHYDRAMINE-ZINC ACETATE 2-0.1 % EX CREA
TOPICAL_CREAM | Freq: Three times a day (TID) | CUTANEOUS | Status: DC | PRN
  Administered 2014-09-01: 02:00:00 via TOPICAL
  Filled 2014-08-31: qty 28

## 2014-08-31 MED ORDER — NICOTINE 21 MG/24HR TD PT24
21.0000 mg | MEDICATED_PATCH | Freq: Every day | TRANSDERMAL | Status: DC
  Administered 2014-08-31 – 2014-09-01 (×2): 21 mg via TRANSDERMAL
  Filled 2014-08-31 (×4): qty 1

## 2014-08-31 MED ORDER — NICOTINE 21 MG/24HR TD PT24
21.0000 mg | MEDICATED_PATCH | TRANSDERMAL | Status: AC
Start: 2014-08-31 — End: 2014-09-01
  Administered 2014-08-31: 21 mg via TRANSDERMAL

## 2014-08-31 MED ORDER — SODIUM CHLORIDE 0.45 % IV SOLN
INTRAVENOUS | Status: DC
  Administered 2014-09-01: 06:00:00 via INTRAVENOUS

## 2014-08-31 MED ORDER — ALUM & MAG HYDROXIDE-SIMETH 200-200-20 MG/5ML PO SUSP: 30.0000 mL | Freq: Three times a day (TID) | ORAL | Status: DC | PRN

## 2014-08-31 MED ORDER — CHLORHEXIDINE GLUCONATE 4 % EX LIQD
60.0000 mL | Freq: Once | CUTANEOUS | Status: AC
  Administered 2014-09-01: 4 via TOPICAL
  Filled 2014-08-31: qty 60

## 2014-08-31 NOTE — Progress Notes (Signed)
Pt had "boyfriend" to come visit.  Pt became very upset with visitor and Clinical research associatewriter and staff heard "yelling" from the room.  Writer and other staff member entered room to find pt and visitor discussing what pt describes as a "disagreement".  Situation continued to escalate and security was called to room.  [employer] officer requested to speak with visitor and visitor was asked to leave at this time.  Upon request of visitor to leave, pt began to scream & curse at officer.  Pt requesting for writer Banker(RN) to dc her IV and let her "go out and find him". Writer dc'd the IV and notified Dr. Elisabeth Pigeonevine of pt request to leave.   Furniture conservator/restorerWriter and officer informed pt that if she left the hospital she could not return to this room.  AMA papers presented to pt and pt became upset due to her thoughts of having surgery.  Pt decided to stay at hospital at this time.  Monique [name], Monique [name]

## 2014-08-31 NOTE — Progress Notes (Addendum)
Patient ID: Monique [name], female   DOB: [birth year], 39 y.o.   MRN: [id number] [facility] PROGRESS NOTE  AYSLIN KUNDERT NWG:[id number] DOB: [birth year] DOA: 08/29/2014 PCP: Patient will follow with [facility] on discharge.  Brief narrative:    39 y.o. Female with past medical history of drug abuse, prostitution who presented to [facility] ED with altered mental status. She was found unresponsive while eating and when EMS arrived they gave her a narcan but with no significant changes in mental status.   In ED, she was found to have T of 99.5 F, HR 96, RR 25, BP 81/45, oxygen saturation of 89% on room air but this has improved with [place] oxygen support. UA showed small leukocytes and many bacteria and she was started on empiric rocephin for possible UTI. Alcohol level was WNL and UDS was positive for cocaine, benzos and THC. CT head did not show acute intracranial findings. Due to her initial presentation of unresponsiveness she was admitted to stepdown unit.  Patient was transferred out of the stepdown unit the 08/30/2014. Her mental status is much better. Hospital course is complicated with finding of septic arthritis and osteomyelitis in the right tibiotalar joint. Orthopedic surgery has seen the patient in consultation.  Barrier to discharge: Patient has reported right ankle pain. Initial x-ray did not point towards the infection but the repeat x-ray data and we followed this with MRI. MRI revealed septic arthritis and osteomyelitis involving the tibiotalar joint. Orthopedic surgery recommended that she follows with her orthopedic surgeon in [place] but patient refused to do so and says she does not want to follow with Dr. Samuel Bouche. Orthopedic surgery will see the patient in consultation again and make their recommendation.   Assessment/Plan:    Principal Problem:  Acute encephalopathy / Drug overdose / Drug abuse - UDS positive for cocaine, benzos and THC on  admission - Patient received Narcan by EMS and did not improve after that however over period of 24 hours after the admission of her mental status has significantly improved. - Her mental status continues to be good. - She was transferred out of the stepdown unit 08/30/2014.  Active Problems:  Acute respiratory failure with hypoxia - Likely from drug overdose - Respiratory status stable.   UTI - UA on admission showed leukocytes and many bacteria - Patient was started on empiric Rocephin. 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 - For possible yeast infection she was given fluconazole   RLE cellulitis / right ankle pain / septic arthritis / chronic osteomyelitis of the tibiotalar joint  - X-ray of the right ankle on the admission did not reveal acute findings.  - Repeat x-ray showed some concern for possible acute infection. - MRI of the right ankle revealed septic arthritis and osteomyelitis involving the tibiotalar joint - Per orthopedic surgery, patient most likely needs removal of all hardware and long-term abx. Patient refused to follow with Dr. Samuel Bouche so orthopedic surgery will see her again and make recommendations.   DVT prophylaxis:  - SCD's bilaterally     Code Status: Full.  Family Communication:  plan of care discussed with the patient Disposition Plan: Patient needs to be seen by orthopedic surgery and make recommendations in regards to treatment of septic arthritis/osteomyelitis. Discharge possibly by 09/02/2014.  IV access:  Peripheral IV  Procedures and diagnostic studies:  Mr Ankle Right  Wo Contrast 08/31/2014   MR findings most consistent with septic arthritis and osteomyelitis involving the tibiotalar joint. Recommend joint aspiration and orthopedic consultation.  Findings were called to the patients nurse [name] at 7:30  p.m. the date of the study. I also discussed this case directly with the nurse practitioner that was covering for the hospitalists at [facility].   Electronically Signed   By: Rudie Meyer M.D.   On: 08/31/2014 08:38    Dg Chest 1 View 08/29/2014  Low lung volumes without focal disease.     Dg Ankle 2 Views Right 08/30/2014  1. Lucency around fibular fixation hardware suggesting loosening or infection.    Dg Ankle Complete Right 08/29/2014   No acute osseous abnormality. Old bimalleolar ORIF with loose hardware.    Ct Head Wo Contrast 08/29/2014    No focal acute intracranial abnormality identified.   Electronically Signed   By: Sherian Rein M.D.   On: 08/29/2014 14:03    Medical Consultants:  Orthopedic surgery   Other Consultants:  None   IAnti-Infectives:   Rocephin 08/29/2014 --> Doxycycline 08/29/2014 -->  Fluconazole 08/30/2014 -->    Manson Passey, MD  [facility] Pager [phone]  Time spent in minutes: 25 minutes  If 7PM-7AM, please contact night-coverage [email or web address] Password [email or web address] 08/31/2014, 10:40 AM   LOS: 2 days    HPI/Subjective: No acute overnight events. Patient reports pain in right ankle.  Objective: Filed Vitals:   08/30/14 1152 08/30/14 1446 08/30/14 2019 08/31/14 0437  BP: 128/57 113/44 124/64 100/63  Pulse: 63 74 75 78  Temp: 97.9 F (36.6 C) 98.2 F (36.8 C) 98.3 F (36.8 C) 98.1 F (36.7 C)  TempSrc: Axillary Oral Oral Oral  Resp: 16 16 16 16   Height:      Weight:    90.719 kg (200 lb)  SpO2: 100% 97% 100% 100%    Intake/Output Summary (Last 24 hours) at 08/31/14 1040 Last data filed at 08/31/14 0845  Gross per 24 hour  Intake   1985 ml  Output    200 ml  Net   1785 ml    Exam:   General:  Pt is alert, follows commands appropriately, not in acute distress  Cardiovascular: Regular rate and rhythm, S1/S2, no murmurs  Respiratory: Clear to auscultation bilaterally, no wheezing, no crackles, no rhonchi  Abdomen: Soft, non tender,  non distended, bowel sounds present  Extremities: right ankle less red but swollen, pulses DP and PT palpable bilaterally  Neuro: Grossly nonfocal  Data Reviewed: Basic Metabolic Panel:  Recent Labs Lab 08/29/14 1334 08/29/14 1347 08/30/14 0350  NA 136 137 139  K 4.0 3.6 3.8  CL 99* 101 105  CO2 26  --  28  GLUCOSE 122* 124* 110*  BUN 17 17 15   CREATININE 0.87 0.90 0.75  CALCIUM 8.9  --  8.4*   Liver Function Tests:  Recent Labs Lab 08/29/14 1334 08/30/14 0350  AST 40 17  ALT 23 19  ALKPHOS 67 53  BILITOT 0.8 0.5  PROT 8.1 6.2*  ALBUMIN 4.1 3.1*   No results for input(s): LIPASE, AMYLASE in the last 168 hours.  Recent Labs Lab 08/29/14 1329  AMMONIA 12   CBC:  Recent Labs Lab 08/29/14 1334 08/29/14 1347 08/30/14 0350  WBC 10.4  --  5.4  NEUTROABS 7.1  --   --   HGB 11.8* 13.9 9.7*  HCT 37.6 41.0 31.9*  MCV 80.9  --  83.7  PLT 432*  --  340   Cardiac Enzymes: No results for input(s): CKTOTAL, CKMB, CKMBINDEX, TROPONINI in the last 168 hours. BNP: Invalid input(s): POCBNP CBG:  Recent Labs Lab 08/30/14 0803 08/31/14 0723  GLUCAP 96 92    Recent Results (from the past 240 hour(s))  MRSA PCR Screening     Status: None   Collection Time: 08/29/14  5:15 PM  Result Value Ref Range Status   MRSA by PCR NEGATIVE NEGATIVE Final     Scheduled Meds: . cefTRIAXone (ROCEPHIN)  IV  1 g Intravenous Q24H  . doxycycline  100 mg Oral Q12H  . fluconazole  100 mg Oral Daily  . nicotine  21 mg Transdermal Daily  . nicotine  21 mg Transdermal NOW  . permethrin   Topical Once  . sodium chloride  3 mL Intravenous Q12H   Continuous Infusions: . sodium chloride 75 mL/hr at 08/31/14 0243

## 2014-08-31 NOTE — Progress Notes (Signed)
Shift event note:  Notified by radiologist just after shift change regarding (R) ankle MRI. Dr Franciso BendGalleroni reports concern for septic arthritis of (R) ankle or other severe inflammatory process. Consulted with Dr Roda ShuttersXu w/ orthopedic service who agreed to see pt, (see note and recommendations).   Leanne ChangKatherine P. Sherrill Mckamie, NP-C [facility] Pager [phone]

## 2014-08-31 NOTE — Consult Note (Signed)
Reason for Consult: Osteomyelitis with septic arthritis right ankle Referring Physician: Dr. Trudi Ida Monique [name] is an 39 y.o. female.  HPI: Patient is a 39 year old woman who states she sustained a bimalleolar fracture and [place] about 8 months ago. She underwent open reduction internal fixation with Dr. Linton Rump subsequently she was placed on a PICC line with IV antibiotics for 6 weeks postoperatively. Patient states she still has persistent pain redness swelling of the right ankle.  Past Medical History  Diagnosis Date  . MRSA infection   . Bipolar disorder     Past Surgical History  Procedure Laterality Date  . Tubal ligation      No family history on file.  Social History:  reports that she has been smoking Cigarettes.  She has been smoking about 1.00 pack per day. She has never used smokeless tobacco. She reports that she drinks about 1.2 oz of alcohol per week. She reports that she uses illicit drugs (Cocaine, Marijuana, and Benzodiazepines).  Allergies: No Known Allergies  Medications: I have reviewed the patient's current medications.  Results for orders placed or performed during the hospital encounter of 08/29/14 (from the past 48 hour(s))  TSH     Status: None   Collection Time: 08/30/14  3:50 AM  Result Value Ref Range   TSH 2.190 0.350 - 4.500 uIU/mL  Comprehensive metabolic panel     Status: Abnormal   Collection Time: 08/30/14  3:50 AM  Result Value Ref Range   Sodium 139 135 - 145 mmol/L   Potassium 3.8 3.5 - 5.1 mmol/L   Chloride 105 101 - 111 mmol/L   CO2 28 22 - 32 mmol/L   Glucose, Bld 110 (H) 65 - 99 mg/dL   BUN 15 6 - 20 mg/dL   Creatinine, Ser 0.75 0.44 - 1.00 mg/dL   Calcium 8.4 (L) 8.9 - 10.3 mg/dL   Total Protein 6.2 (L) 6.5 - 8.1 g/dL   Albumin 3.1 (L) 3.5 - 5.0 g/dL   AST 17 15 - 41 U/L   ALT 19 14 - 54 U/L   Alkaline Phosphatase 53 38 - 126 U/L   Total Bilirubin 0.5 0.3 - 1.2 mg/dL   GFR calc non Af Amer >60 >60 mL/min   GFR calc Af  Amer >60 >60 mL/min    Comment: (NOTE) The eGFR has been calculated using the CKD EPI equation. This calculation has not been validated in all clinical situations. eGFR's persistently <60 mL/min signify possible Chronic Kidney Disease.    Anion gap 6 5 - 15  CBC     Status: Abnormal   Collection Time: 08/30/14  3:50 AM  Result Value Ref Range   WBC 5.4 4.0 - 10.5 K/uL   RBC 3.81 (L) 3.87 - 5.11 MIL/uL   Hemoglobin 9.7 (L) 12.0 - 15.0 g/dL    Comment: DELTA CHECK NOTED REPEATED TO VERIFY    HCT 31.9 (L) 36.0 - 46.0 %   MCV 83.7 78.0 - 100.0 fL   MCH 25.5 (L) 26.0 - 34.0 pg   MCHC 30.4 30.0 - 36.0 g/dL   RDW 15.6 (H) 11.5 - 15.5 %   Platelets 340 150 - 400 K/uL  Glucose, capillary     Status: None   Collection Time: 08/30/14  8:03 AM  Result Value Ref Range   Glucose-Capillary 96 65 - 99 mg/dL   Comment 1 Notify RN    Comment 2 Document in Chart   HIV antibody  Status: None   Collection Time: 08/30/14 12:05 PM  Result Value Ref Range   HIV Screen 4th Generation wRfx Non Reactive Non Reactive    Comment: (NOTE) Performed At: [facility] [address] [place], [place] [id number] Lindon Romp MD WU:[id number]   Glucose, capillary     Status: None   Collection Time: 08/31/14  7:23 AM  Result Value Ref Range   Glucose-Capillary 92 65 - 99 mg/dL    Dg Ankle 2 Views Right  08/30/2014   CLINICAL DATA:  Pain, swelling, redness.  Lateral wound.  EXAM: RIGHT ANKLE - 2 VIEW  COMPARISON:  08/29/2014  FINDINGS: Plate and screw fixation of the distal fibular shaft, with 2 screws extending across into the distal tibial metaphysis. There is lucency around multiple screws. The inferior most screw has backed out at least 6 mm, as seen on previous exam. Paired screws are noted across the medial malleolus as well, intact without surrounding lucency. There is mild widening of the ankle mortise medially. No fracture or dislocation. Normal osseous mineralization elsewhere.   IMPRESSION: 1. Lucency around fibular fixation hardware suggesting loosening or infection.   Electronically Signed   By: Lucrezia Europe M.D.   On: 08/30/2014 09:42   Mr Ankle Right  Wo Contrast  08/31/2014   CLINICAL DATA:  Ankle Pain and swelling for the past month. History of prior fracture fixation with hardware.  EXAM: MRI OF THE RIGHT ANKLE WITHOUT CONTRAST  TECHNIQUE: Multiplanar, multisequence MR imaging of the ankle was performed. No intravenous contrast was administered.  COMPARISON:  Radiograph 08/30/2014 and 08/29/2014  FINDINGS: Moderate artifact associated with the tibial and fibular hardware. There is tibiotalar joint space narrowing, large joint effusion and severe synovitis. There is also marrow edema in the tibia and talus. Findings are most consistent with septic arthritis and osteomyelitis. Un advanced inflammatory arthropathy is also possible but unlikely.  The major tendons and ligaments appear intact. Small subtalar joint effusion involving the posterior facet and mild edema in the sinus tarsi.  IMPRESSION:  MR findings most consistent with septic arthritis and osteomyelitis involving the tibiotalar joint. Recommend joint aspiration and orthopedic consultation.  Findings were called to the patients nurse [name] at 7:30 p.m. the date of the study. I also discussed this case directly with the nurse practitioner that was covering for the hospitalists at [facility].   Electronically Signed   By: Marijo Sanes M.D.   On: 08/31/2014 08:38    Review of Systems  All other systems reviewed and are negative.  Blood pressure 100/63, pulse 78, temperature 98.1 F (36.7 C), temperature source Oral, resp. rate 16, height _0  (1.6 m), weight 90.719 kg (200 lb), SpO2 100 %. Physical Exam On examination patient has cellulitis around the right foot and ankle. She has a good dorsalis pedis pulse. There is some evidence of chronic drainage sinus tracts from the lateral aspect of her ankle. I reviewed  her radiographs of the right ankle which shows lytic changes around all the screws most likely consistent with her chronic osteomyelitis. The joint is not congruent there is widening medially and shortening of the fibula. Review of the MRI scan shows a abscess within the tibial talar joint consistent with septic arthritis in light of her destructive bony changes. There is inflammation of the talar dome medially. Assessment/Plan: Assessment: Osteomyelitis right ankle with septic arthritis of the tibial talar joint.  Plan: Discussed with the patient that we do need to proceed with surgery for  removal the deep retained hardware arthrotomy of the ankle joint obtaining cultures and placement of antibiotics beads within the ankle joint. Postoperatively she will need an infectious disease consult with placement of IV antibiotics for proximal to 6 months. With the chronic nature of this infection I am not sure if we can eradicate the infection. Patient states that she smokes. Discussed that any smoking postoperatively could significantly impact the healing and could cause breakdown of the wounds and potentially  require a transtibial amputation. Patient states she understands and wishes to proceed with surgery at this time. Plan for add-on surgery Wednesday approximate 5 PM  Monique [name] 08/31/2014, 5:46 PM

## 2014-09-01 DIAGNOSIS — F17210 Nicotine dependence, cigarettes, uncomplicated: Secondary | ICD-10-CM

## 2014-09-01 DIAGNOSIS — M00071 Staphylococcal arthritis, right ankle and foot: Secondary | ICD-10-CM

## 2014-09-01 DIAGNOSIS — F319 Bipolar disorder, unspecified: Secondary | ICD-10-CM

## 2014-09-01 DIAGNOSIS — F1910 Other psychoactive substance abuse, uncomplicated: Secondary | ICD-10-CM

## 2014-09-01 DIAGNOSIS — F1210 Cannabis abuse, uncomplicated: Secondary | ICD-10-CM

## 2014-09-01 DIAGNOSIS — B9561 Methicillin susceptible Staphylococcus aureus infection as the cause of diseases classified elsewhere: Secondary | ICD-10-CM

## 2014-09-01 DIAGNOSIS — Z8614 Personal history of Methicillin resistant Staphylococcus aureus infection: Secondary | ICD-10-CM

## 2014-09-01 LAB — SURGICAL PCR SCREEN
MRSA, PCR: NEGATIVE
Staphylococcus aureus: NEGATIVE

## 2014-09-01 LAB — GLUCOSE, CAPILLARY: Glucose-Capillary: 92 mg/dL (ref 65–99)

## 2014-09-01 LAB — SEDIMENTATION RATE: Sed Rate: 40 mm/hr — ABNORMAL HIGH (ref 0–22)

## 2014-09-01 LAB — C-REACTIVE PROTEIN: CRP: 1.5 mg/dL — AB (ref <1.0)

## 2014-09-01 MED ORDER — ZOLPIDEM TARTRATE 5 MG PO TABS
5.0000 mg | ORAL_TABLET | Freq: Every evening | ORAL | Status: DC | PRN
  Administered 2014-09-01 – 2014-09-05 (×4): 5 mg via ORAL
  Filled 2014-09-01 (×5): qty 1

## 2014-09-01 MED ORDER — CALAMINE EX LOTN
TOPICAL_LOTION | Freq: Two times a day (BID) | CUTANEOUS | Status: DC
  Administered 2014-09-01 – 2014-09-06 (×9): via TOPICAL
  Filled 2014-09-01: qty 118

## 2014-09-01 NOTE — Consult Note (Addendum)
[facility] for Infectious Disease  Date of Admission:  08/29/2014  Date of Consult:  09/01/2014  Reason for Consult: Septic Arthritis Referring Physician: Charlies Silvers  Impression/Recommendation Septic Arthritis- prev MRSA IVDA Bipolar Tobacco dependence  Would: Await op cx Change to ceftriaxone and vanco post-op Not place PIC Check ESR and CRP Nicotine replacement therapy Consider Psych eval, she has not had f/u for this.   HIV (-), RPR (p), GC/Chalmydia (-).   Cx from 12-2013: rare MRSA- Sens- vanco, TET, bactrim, rifampin.   Thank you so much for this interesting consult,   Bobby Rumpf (pager) [phone] [email or web address]  Monique [name] is an 39 y.o. female.  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 In ED she was hypoxic, hypotensive. She had UDS+ for cocaine, benzo, THC. Her UA showed pyuria and she was started on ceftriaxone/doxy.  She was also noted to have RLE cellutitis. She has a hx ORIF R ankle at [facility] Oct 2015.  She states that this immediately became infected, she was treated with IV anbx (clinda?) for 6 weeks and then d/c by her ortho. She has a wound that intermittently blisters, opens and drains. She states she has been f/u in the [facility] ED, getting pain rx and anbx. Her last anbx were 1 month ago.  Her 7-4 MRI showed MR findings most consistent with septic arthritis and osteomyelitis involving the tibiotalar joint.  She is scheduled for removal of hardware today.   Past Medical History  Diagnosis Date  . MRSA infection   . Bipolar disorder     Past Surgical History  Procedure Laterality Date  . Tubal ligation       No Known Allergies  Medications:  Scheduled: . cefTRIAXone (ROCEPHIN)  IV  1 g Intravenous Q24H  . doxycycline  100 mg Oral Q12H  . fluconazole  100 mg Oral Daily  . nicotine  21 mg Transdermal Daily  . sodium chloride  3 mL Intravenous Q12H    Abtx:  Anti-infectives    Start     Dose/Rate  Route Frequency Ordered Stop   08/30/14 1600  cefTRIAXone (ROCEPHIN) 1 g in dextrose 5 % 50 mL IVPB     1 g 100 mL/hr over 30 Minutes Intravenous Every 24 hours 08/29/14 2025     08/30/14 1300  fluconazole (DIFLUCAN) tablet 100 mg     100 mg Oral Daily 08/30/14 1143 09/04/14 0959   08/29/14 2200  doxycycline (VIBRA-TABS) tablet 100 mg     100 mg Oral Every 12 hours 08/29/14 2025     08/29/14 1530  cefTRIAXone (ROCEPHIN) 1 g in dextrose 5 % 50 mL IVPB     1 g 100 mL/hr over 30 Minutes Intravenous  Once 08/29/14 1523 08/29/14 1631      Total days of antibiotics 3 (ceftriaxone, doxy)          Social History:  reports that she has been smoking Cigarettes.  She has been smoking about 1.00 pack per day. She has never used smokeless tobacco. She reports that she drinks about 1.2 oz of alcohol per week. She reports that she uses illicit drugs (Cocaine, Marijuana, and Benzodiazepines).  No family history on file.  General ROS: +F/C, no BM, nl urine, nl BM, pain with ambulation, mild d/c from her lateral ankle. see HPI.   Blood pressure 124/66, pulse 77, temperature 97.7 F (36.5 C), temperature source Oral, resp. rate 18, height '5\' 3"'  (1.6 m), weight  91.763 kg (202 lb 4.8 oz), SpO2 100 %. General appearance: alert, cooperative and no distress Eyes: negative findings: conjunctivae and sclerae normal and pupils equal, round, reactive to light and accomodation Throat: normal findings: oropharynx pink & moist without lesions or evidence of thrush Neck: no adenopathy and supple, symmetrical, trachea midline Lungs: clear to auscultation bilaterally Heart: regular rate and rhythm Abdomen: normal findings: bowel sounds normal and soft, non-tender Extremities: R ankle is swollen, tender, eliptical wound on lateral ankle ~ 2 mm. no increase in heat, no d/c, minimal erythema.    Results for orders placed or performed during the hospital encounter of 08/29/14 (from the past 48 hour(s))  Glucose,  capillary     Status: None   Collection Time: 08/31/14  7:23 AM  Result Value Ref Range   Glucose-Capillary 92 65 - 99 mg/dL  CBC WITH DIFFERENTIAL     Status: Abnormal   Collection Time: 08/31/14  6:20 PM  Result Value Ref Range   WBC 6.4 4.0 - 10.5 K/uL   RBC 4.01 3.87 - 5.11 MIL/uL   Hemoglobin 10.3 (L) 12.0 - 15.0 g/dL   HCT 33.8 (L) 36.0 - 46.0 %   MCV 84.3 78.0 - 100.0 fL   MCH 25.7 (L) 26.0 - 34.0 pg   MCHC 30.5 30.0 - 36.0 g/dL   RDW 15.7 (H) 11.5 - 15.5 %   Platelets 372 150 - 400 K/uL   Neutrophils Relative % 50 43 - 77 %   Neutro Abs 3.2 1.7 - 7.7 K/uL   Lymphocytes Relative 42 12 - 46 %   Lymphs Abs 2.7 0.7 - 4.0 K/uL   Monocytes Relative 6 3 - 12 %   Monocytes Absolute 0.4 0.1 - 1.0 K/uL   Eosinophils Relative 2 0 - 5 %   Eosinophils Absolute 0.1 0.0 - 0.7 K/uL   Basophils Relative 0 0 - 1 %   Basophils Absolute 0.0 0.0 - 0.1 K/uL  Comprehensive metabolic panel     Status: Abnormal   Collection Time: 08/31/14  6:20 PM  Result Value Ref Range   Sodium 136 135 - 145 mmol/L   Potassium 4.0 3.5 - 5.1 mmol/L   Chloride 103 101 - 111 mmol/L   CO2 26 22 - 32 mmol/L   Glucose, Bld 114 (H) 65 - 99 mg/dL   BUN 12 6 - 20 mg/dL   Creatinine, Ser 0.80 0.44 - 1.00 mg/dL   Calcium 8.8 (L) 8.9 - 10.3 mg/dL   Total Protein 7.1 6.5 - 8.1 g/dL   Albumin 3.4 (L) 3.5 - 5.0 g/dL   AST 15 15 - 41 U/L   ALT 18 14 - 54 U/L   Alkaline Phosphatase 50 38 - 126 U/L   Total Bilirubin 0.2 (L) 0.3 - 1.2 mg/dL   GFR calc non Af Amer >60 >60 mL/min   GFR calc Af Amer >60 >60 mL/min    Comment: (NOTE) The eGFR has been calculated using the CKD EPI equation. This calculation has not been validated in all clinical situations. eGFR's persistently <60 mL/min signify possible Chronic Kidney Disease.    Anion gap 7 5 - 15  Protime-INR     Status: None   Collection Time: 08/31/14  6:20 PM  Result Value Ref Range   Prothrombin Time 12.3 11.6 - 15.2 seconds   INR 0.90 0.00 - 1.49  Type and  screen     Status: None   Collection Time: 08/31/14  6:20 PM  Result Value  Ref Range   ABO/RH(D) O POS    Antibody Screen NEG    Sample Expiration 09/03/2014   ABO/Rh     Status: None   Collection Time: 08/31/14  6:20 PM  Result Value Ref Range   ABO/RH(D) O POS   Surgical pcr screen     Status: None   Collection Time: 09/01/14  7:00 AM  Result Value Ref Range   MRSA, PCR NEGATIVE NEGATIVE   Staphylococcus aureus NEGATIVE NEGATIVE    Comment:        The Xpert SA Assay (FDA approved for NASAL specimens in patients over 67 years of age), is one component of a comprehensive surveillance program.  Test performance has been validated by [facility] for patients greater than or equal to 51 year old. It is not intended to diagnose infection nor to guide or monitor treatment.   Glucose, capillary     Status: None   Collection Time: 09/01/14  7:30 AM  Result Value Ref Range   Glucose-Capillary 92 65 - 99 mg/dL      Component Value Date/Time   SDES URINE, CATHETERIZED 08/29/2014 1450   SPECREQUEST NONE 08/29/2014 1450   CULT >=100,000 COLONIES/mL STAPHYLOCOCCUS AUREUS 08/29/2014 1450   REPTSTATUS PENDING 08/29/2014 1450   Mr Ankle Right  Wo Contrast  08/31/2014   CLINICAL DATA:  Ankle Pain and swelling for the past month. History of prior fracture fixation with hardware.  EXAM: MRI OF THE RIGHT ANKLE WITHOUT CONTRAST  TECHNIQUE: Multiplanar, multisequence MR imaging of the ankle was performed. No intravenous contrast was administered.  COMPARISON:  Radiograph 08/30/2014 and 08/29/2014  FINDINGS: Moderate artifact associated with the tibial and fibular hardware. There is tibiotalar joint space narrowing, large joint effusion and severe synovitis. There is also marrow edema in the tibia and talus. Findings are most consistent with septic arthritis and osteomyelitis. Un advanced inflammatory arthropathy is also possible but unlikely.  The major tendons and ligaments appear intact. Small  subtalar joint effusion involving the posterior facet and mild edema in the sinus tarsi.  IMPRESSION:  MR findings most consistent with septic arthritis and osteomyelitis involving the tibiotalar joint. Recommend joint aspiration and orthopedic consultation.  Findings were called to the patients nurse [name] at 7:30 p.m. the date of the study. I also discussed this case directly with the nurse practitioner that was covering for the hospitalists at [facility].   Electronically Signed   By: Marijo Sanes M.D.   On: 08/31/2014 08:38   Recent Results (from the past 240 hour(s))  Urine culture     Status: None (Preliminary result)   Collection Time: 08/29/14  2:50 PM  Result Value Ref Range Status   Specimen Description URINE, CATHETERIZED  Final   Special Requests NONE  Final   Culture >=100,000 COLONIES/mL STAPHYLOCOCCUS AUREUS  Final   Report Status PENDING  Incomplete   Organism ID, Bacteria STAPHYLOCOCCUS AUREUS  Final      Susceptibility   Staphylococcus aureus - MIC*    CIPROFLOXACIN <=0.5 SENSITIVE Sensitive     GENTAMICIN <=0.5 SENSITIVE Sensitive     NITROFURANTOIN <=16 SENSITIVE Sensitive     OXACILLIN <=0.25 SENSITIVE Sensitive     TETRACYCLINE <=1 SENSITIVE Sensitive     VANCOMYCIN <=0.5 SENSITIVE Sensitive     TRIMETH/SULFA <=10 SENSITIVE Sensitive     CLINDAMYCIN <=0.25 SENSITIVE Sensitive     RIFAMPIN <=0.5 SENSITIVE Sensitive     Inducible Clindamycin Value in next row Sensitive  NEGATIVEPerformed at [facility]    * >=100,000 COLONIES/mL STAPHYLOCOCCUS AUREUS  MRSA PCR Screening     Status: None   Collection Time: 08/29/14  5:15 PM  Result Value Ref Range Status   MRSA by PCR NEGATIVE NEGATIVE Final    Comment:        The GeneXpert MRSA Assay (FDA approved for NASAL specimens only), is one component of a comprehensive MRSA colonization surveillance program. It is not intended to diagnose MRSA infection nor to guide or monitor treatment for MRSA  infections.   Surgical pcr screen     Status: None   Collection Time: 09/01/14  7:00 AM  Result Value Ref Range Status   MRSA, PCR NEGATIVE NEGATIVE Final   Staphylococcus aureus NEGATIVE NEGATIVE Final    Comment:        The Xpert SA Assay (FDA approved for NASAL specimens in patients over 34 years of age), is one component of a comprehensive surveillance program.  Test performance has been validated by [facility] for patients greater than or equal to 86 year old. It is not intended to diagnose infection nor to guide or monitor treatment.       09/01/2014, 2:53 PM     LOS: 3 days

## 2014-09-01 NOTE — Interval H&P Note (Signed)
History and Physical Interval Note:  09/01/2014 11:16 AM  Monique [name]  has presented today for surgery, with the diagnosis of Infected Right Ankle Hardware  The various methods of treatment have been discussed with the patient and family. After consideration of risks, benefits and other options for treatment, the patient has consented to  Procedure(s): Removal Right Lateral Ankle Hardware, Place Antibiotic Bead and Wound VAC (Right) as a surgical intervention .  The patient's history has been reviewed, patient examined, no change in status, stable for surgery.  I have reviewed the patient's chart and labs.  Questions were answered to the patient's satisfaction.     DUDA,MARCUS V

## 2014-09-01 NOTE — Care Management Note (Signed)
Case Management Note  Patient Details  Name: Monique [name] MRN: [id number] Date of Birth: 07/22/1975  Subjective/Objective:   Pt admitted with AMS, Drug overdose, Infected Right Ankle Hardware              Action/Plan:Plan surgery 09/01/14, may need SNF   Expected Discharge Date:   (unknown)               Expected Discharge Plan:  Home w Home Health Services  In-House Referral:  Clinical Social Work  Discharge planning Services  CM Consult  Post Acute Care Choice:    Choice offered to:     DME Arranged:    DME Agency:     HH Arranged:  RN, PT HH Agency:     Status of Service:  In process, will continue to follow  Medicare Important Message Given:    Date Medicare IM Given:    Medicare IM give by:    Date Additional Medicare IM Given:    Additional Medicare Important Message give by:     If discussed at Long Length of Stay Meetings, dates discussed:    Additional CommentsGeni Bers:  Jaccob Czaplicki, RN 09/01/2014, 12:28 PM

## 2014-09-01 NOTE — Anesthesia Preprocedure Evaluation (Addendum)
Anesthesia Evaluation  Patient identified by MRN, date of birth, ID band Patient awake    Reviewed: Allergy & Precautions, H&P , NPO status , Patient's Chart, lab work & pertinent test results  History of Anesthesia Complications Negative for: history of anesthetic complications  Airway Mallampati: II  TM Distance: >3 FB Neck ROM: Full    Dental no notable dental hx. (+) Poor Dentition, Dental Advisory Given, Chipped   Pulmonary COPDCurrent Smoker,  breath sounds clear to auscultation  Pulmonary exam normal       Cardiovascular negative cardio ROS  Rhythm:Regular Rate:Normal     Neuro/Psych Anxiety Bipolar Disorder negative neurological ROS     GI/Hepatic Neg liver ROS, GERD-  Controlled,(+)     substance abuse (last cocaine use 4 days ago, last MJ 4 weeks ago)  alcohol use, cocaine use and marijuana use,   Endo/Other  negative endocrine ROS  Renal/GU negative Renal ROS  negative genitourinary   Musculoskeletal   Abdominal (+) + obese,   Peds  Hematology  (+) Blood dyscrasia (Hb 10.3), ,   Anesthesia Other Findings   Reproductive/Obstetrics negative OB ROS 08/29/14 preg test: NEG                          Anesthesia Physical Anesthesia Plan  ASA: III  Anesthesia Plan: General   Post-op Pain Management:    Induction: Intravenous  Airway Management Planned: LMA  Additional Equipment:   Intra-op Plan:   Post-operative Plan: Extubation in OR  Informed Consent: I have reviewed the patients History and Physical, chart, labs and discussed the procedure including the risks, benefits and alternatives for the proposed anesthesia with the patient or authorized representative who has indicated his/her understanding and acceptance.   Dental advisory given  Plan Discussed with: CRNA and Surgeon  Anesthesia Plan Comments: (Plan routine monitors, GA- LMA OK)       Anesthesia Quick  Evaluation
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
## 2014-09-02 ENCOUNTER — Inpatient Hospital Stay (HOSPITAL_COMMUNITY): Payer: Medicaid Other | Admitting: Certified Registered Nurse Anesthetist

## 2014-09-02 ENCOUNTER — Encounter (HOSPITAL_COMMUNITY)
Admission: EM | Disposition: A | Discharge status: Home / Self Care | Payer: Self-pay | Source: Home / Self Care | Attending: Internal Medicine

## 2014-09-02 ENCOUNTER — Encounter (HOSPITAL_COMMUNITY): Payer: Self-pay | Admitting: Certified Registered Nurse Anesthetist

## 2014-09-02 DIAGNOSIS — M869 Osteomyelitis, unspecified: Secondary | ICD-10-CM | POA: Insufficient documentation

## 2014-09-02 HISTORY — PX: HARDWARE REMOVAL: SHX979

## 2014-09-02 LAB — HEPATITIS PANEL, ACUTE
HEP B S AG: NEGATIVE
Hep A IgM: NEGATIVE
Hep B C IgM: NEGATIVE

## 2014-09-02 LAB — GC/CHLAMYDIA PROBE AMP (~~LOC~~) NOT AT ARMC
Chlamydia: NEGATIVE
Neisseria Gonorrhea: NEGATIVE

## 2014-09-02 LAB — URINE CULTURE

## 2014-09-02 LAB — RPR: RPR Ser Ql: NONREACTIVE

## 2014-09-02 LAB — GLUCOSE, CAPILLARY: Glucose-Capillary: 97 mg/dL (ref 65–99)

## 2014-09-02 SURGERY — REMOVAL, HARDWARE
Anesthesia: General | Laterality: Right

## 2014-09-02 MED ORDER — PROMETHAZINE HCL 25 MG/ML IJ SOLN: 6.2500 mg | INTRAMUSCULAR | Status: DC | PRN

## 2014-09-02 MED ORDER — METHOCARBAMOL 1000 MG/10ML IJ SOLN
500.0000 mg | Freq: Four times a day (QID) | INTRAVENOUS | Status: DC | PRN
  Administered 2014-09-02: 500 mg via INTRAVENOUS
  Filled 2014-09-02 (×3): qty 5

## 2014-09-02 MED ORDER — HYDROCORTISONE SOD SUCCINATE 100 MG PF FOR IT USE
INTRAMUSCULAR | Status: DC | PRN
  Administered 2014-09-02: 100 mg via INTRATHECAL

## 2014-09-02 MED ORDER — HYDROCORTISONE NA SUCCINATE PF 100 MG IJ SOLR
INTRAMUSCULAR | Status: AC
  Filled 2014-09-02: qty 2

## 2014-09-02 MED ORDER — ACETAMINOPHEN 10 MG/ML IV SOLN
INTRAVENOUS | Status: DC | PRN
  Administered 2014-09-02: 1000 mg via INTRAVENOUS

## 2014-09-02 MED ORDER — MIDAZOLAM HCL 5 MG/5ML IJ SOLN
INTRAMUSCULAR | Status: DC | PRN
  Administered 2014-09-02 (×2): .5 mg via INTRAVENOUS

## 2014-09-02 MED ORDER — HYDROMORPHONE HCL 1 MG/ML IJ SOLN: 0.2500 mg | INTRAMUSCULAR | Status: DC | PRN

## 2014-09-02 MED ORDER — ACETAMINOPHEN 650 MG RE SUPP: 650.0000 mg | Freq: Four times a day (QID) | RECTAL | Status: DC | PRN

## 2014-09-02 MED ORDER — ONDANSETRON HCL 4 MG/2ML IJ SOLN
INTRAMUSCULAR | Status: AC
  Filled 2014-09-02: qty 2

## 2014-09-02 MED ORDER — LIDOCAINE HCL (CARDIAC) 20 MG/ML IV SOLN
INTRAVENOUS | Status: AC
  Filled 2014-09-02: qty 5

## 2014-09-02 MED ORDER — DEXAMETHASONE SODIUM PHOSPHATE 10 MG/ML IJ SOLN
INTRAMUSCULAR | Status: AC
  Filled 2014-09-02: qty 1

## 2014-09-02 MED ORDER — MEPERIDINE HCL 25 MG/ML IJ SOLN: 6.2500 mg | INTRAMUSCULAR | Status: DC | PRN

## 2014-09-02 MED ORDER — HYDROMORPHONE HCL 1 MG/ML IJ SOLN
0.2500 mg | INTRAMUSCULAR | Status: DC | PRN
  Administered 2014-09-02 (×4): 0.5 mg via INTRAVENOUS

## 2014-09-02 MED ORDER — PROPOFOL 10 MG/ML IV BOLUS
INTRAVENOUS | Status: AC
  Filled 2014-09-02: qty 20

## 2014-09-02 MED ORDER — METOCLOPRAMIDE HCL 5 MG/ML IJ SOLN: 5.0000 mg | Freq: Three times a day (TID) | INTRAMUSCULAR | Status: DC | PRN

## 2014-09-02 MED ORDER — FENTANYL CITRATE (PF) 100 MCG/2ML IJ SOLN
INTRAMUSCULAR | Status: AC
  Filled 2014-09-02: qty 2

## 2014-09-02 MED ORDER — VANCOMYCIN HCL 1000 MG IV SOLR
INTRAVENOUS | Status: DC | PRN
  Administered 2014-09-02: 1000 mg

## 2014-09-02 MED ORDER — METHOCARBAMOL 500 MG PO TABS
500.0000 mg | ORAL_TABLET | Freq: Four times a day (QID) | ORAL | Status: DC | PRN
  Administered 2014-09-04 – 2014-09-06 (×4): 500 mg via ORAL
  Filled 2014-09-02 (×4): qty 1

## 2014-09-02 MED ORDER — NICOTINE 21 MG/24HR TD PT24
21.0000 mg | MEDICATED_PATCH | Freq: Every day | TRANSDERMAL | Status: DC
  Administered 2014-09-02 – 2014-09-06 (×5): 21 mg via TRANSDERMAL
  Filled 2014-09-02 (×4): qty 1

## 2014-09-02 MED ORDER — PROPOFOL 10 MG/ML IV BOLUS
INTRAVENOUS | Status: DC | PRN
  Administered 2014-09-02 (×2): 25 mg via INTRAVENOUS
  Administered 2014-09-02: 175 mg via INTRAVENOUS

## 2014-09-02 MED ORDER — MIDAZOLAM HCL 2 MG/2ML IJ SOLN
INTRAMUSCULAR | Status: AC
  Filled 2014-09-02: qty 2

## 2014-09-02 MED ORDER — FENTANYL CITRATE (PF) 250 MCG/5ML IJ SOLN
INTRAMUSCULAR | Status: AC
  Filled 2014-09-02: qty 5

## 2014-09-02 MED ORDER — ACETAMINOPHEN 10 MG/ML IV SOLN: 1000.0000 mg | Freq: Once | INTRAVENOUS | Status: DC

## 2014-09-02 MED ORDER — DIPHENHYDRAMINE HCL 50 MG/ML IJ SOLN
INTRAMUSCULAR | Status: AC
  Filled 2014-09-02: qty 1

## 2014-09-02 MED ORDER — ONDANSETRON HCL 4 MG PO TABS: 4.0000 mg | ORAL_TABLET | Freq: Four times a day (QID) | ORAL | Status: DC | PRN

## 2014-09-02 MED ORDER — ACETAMINOPHEN 325 MG PO TABS
650.0000 mg | ORAL_TABLET | Freq: Four times a day (QID) | ORAL | Status: DC | PRN
  Administered 2014-09-03: 650 mg via ORAL
  Filled 2014-09-02: qty 2

## 2014-09-02 MED ORDER — ONDANSETRON HCL 4 MG/2ML IJ SOLN
4.0000 mg | Freq: Four times a day (QID) | INTRAMUSCULAR | Status: DC | PRN
Start: 2014-09-02 — End: 2014-09-06
  Administered 2014-09-05: 4 mg via INTRAVENOUS
  Filled 2014-09-02: qty 2

## 2014-09-02 MED ORDER — LACTATED RINGERS IV SOLN
INTRAVENOUS | Status: DC
  Administered 2014-09-02: 1000 mL via INTRAVENOUS

## 2014-09-02 MED ORDER — ONDANSETRON HCL 4 MG/2ML IJ SOLN
INTRAMUSCULAR | Status: DC | PRN
  Administered 2014-09-02: 4 mg via INTRAVENOUS

## 2014-09-02 MED ORDER — DIPHENHYDRAMINE HCL 50 MG/ML IJ SOLN
INTRAMUSCULAR | Status: DC | PRN
  Administered 2014-09-02: 25 mg via INTRAVENOUS

## 2014-09-02 MED ORDER — KETOROLAC TROMETHAMINE 15 MG/ML IJ SOLN
INTRAMUSCULAR | Status: AC
  Filled 2014-09-02: qty 1

## 2014-09-02 MED ORDER — 0.9 % SODIUM CHLORIDE (POUR BTL) OPTIME
TOPICAL | Status: DC | PRN
  Administered 2014-09-02: 1000 mL

## 2014-09-02 MED ORDER — KETOROLAC TROMETHAMINE 15 MG/ML IJ SOLN
15.0000 mg | Freq: Four times a day (QID) | INTRAMUSCULAR | Status: AC
  Administered 2014-09-02 – 2014-09-03 (×4): 15 mg via INTRAVENOUS
  Filled 2014-09-02 (×3): qty 1

## 2014-09-02 MED ORDER — HYDROMORPHONE HCL 1 MG/ML IJ SOLN
INTRAMUSCULAR | Status: AC
  Filled 2014-09-02: qty 1

## 2014-09-02 MED ORDER — ACETAMINOPHEN 10 MG/ML IV SOLN
INTRAVENOUS | Status: AC
  Filled 2014-09-02: qty 100

## 2014-09-02 MED ORDER — METOCLOPRAMIDE HCL 10 MG PO TABS: 5.0000 mg | ORAL_TABLET | Freq: Three times a day (TID) | ORAL | Status: DC | PRN

## 2014-09-02 MED ORDER — EPHEDRINE SULFATE 50 MG/ML IJ SOLN
INTRAMUSCULAR | Status: AC
  Filled 2014-09-02: qty 1

## 2014-09-02 MED ORDER — MIDAZOLAM HCL 2 MG/2ML IJ SOLN: 0.5000 mg | Freq: Once | INTRAMUSCULAR | Status: AC | PRN

## 2014-09-02 MED ORDER — LIDOCAINE HCL (CARDIAC) 20 MG/ML IV SOLN
INTRAVENOUS | Status: DC | PRN
  Administered 2014-09-02: 75 mg via INTRAVENOUS

## 2014-09-02 MED ORDER — HYDROMORPHONE HCL 1 MG/ML IJ SOLN
1.0000 mg | INTRAMUSCULAR | Status: DC | PRN
  Administered 2014-09-02 – 2014-09-05 (×15): 1 mg via INTRAVENOUS
  Filled 2014-09-02 (×15): qty 1

## 2014-09-02 MED ORDER — FENTANYL CITRATE (PF) 100 MCG/2ML IJ SOLN
INTRAMUSCULAR | Status: DC | PRN
  Administered 2014-09-02 (×3): 50 ug via INTRAVENOUS
  Administered 2014-09-02: 25 ug via INTRAVENOUS
  Administered 2014-09-02 (×2): 50 ug via INTRAVENOUS
  Administered 2014-09-02: 25 ug via INTRAVENOUS

## 2014-09-02 MED ORDER — SODIUM CHLORIDE 0.9 % IJ SOLN
INTRAMUSCULAR | Status: AC
  Filled 2014-09-02: qty 10

## 2014-09-02 MED ORDER — SODIUM CHLORIDE 0.9 % IV SOLN
INTRAVENOUS | Status: DC
  Administered 2014-09-02: 21:00:00 via INTRAVENOUS

## 2014-09-02 MED ORDER — HYDROMORPHONE HCL 1 MG/ML IJ SOLN
INTRAMUSCULAR | Status: AC
  Administered 2014-09-02: 0.5 mg via INTRAVENOUS
  Filled 2014-09-02: qty 1

## 2014-09-02 MED ORDER — VANCOMYCIN HCL 1000 MG IV SOLR
INTRAVENOUS | Status: AC
  Filled 2014-09-02: qty 1000

## 2014-09-02 MED ORDER — OXYCODONE HCL 5 MG PO TABS
5.0000 mg | ORAL_TABLET | ORAL | Status: DC | PRN
Start: 2014-09-02 — End: 2014-09-04
  Administered 2014-09-02 – 2014-09-04 (×8): 10 mg via ORAL
  Filled 2014-09-02 (×8): qty 2

## 2014-09-02 MED ORDER — BUPROPION HCL 100 MG PO TABS
100.0000 mg | ORAL_TABLET | Freq: Two times a day (BID) | ORAL | Status: DC
  Administered 2014-09-02 – 2014-09-06 (×9): 100 mg via ORAL
  Filled 2014-09-02 (×12): qty 1

## 2014-09-02 MED ORDER — LACTATED RINGERS IV SOLN
INTRAVENOUS | Status: DC | PRN
  Administered 2014-09-02 (×2): via INTRAVENOUS

## 2014-09-02 SURGICAL SUPPLY — 38 items
BAG SPEC THK2 15X12 ZIP CLS (MISCELLANEOUS) ×1
BAG ZIPLOCK 12X15 (MISCELLANEOUS) ×3 IMPLANT
BNDG COHESIVE 4X5 TAN STRL (GAUZE/BANDAGES/DRESSINGS) ×2 IMPLANT
BNDG COHESIVE 6X5 TAN STRL LF (GAUZE/BANDAGES/DRESSINGS) ×1 IMPLANT
CATH ROBINSON RED A/P 16FR (CATHETERS) ×2 IMPLANT
CLOSURE WOUND 1/2 X4 (GAUZE/BANDAGES/DRESSINGS)
COVER MAYO STAND STRL (DRAPES) ×2 IMPLANT
DRAPE STERI IOBAN 125X83 (DRAPES) ×1 IMPLANT
DRAPE U-SHAPE 47X51 STRL (DRAPES) ×3 IMPLANT
DRSG ADAPTIC 3X8 NADH LF (GAUZE/BANDAGES/DRESSINGS) ×1 IMPLANT
DRSG EMULSION OIL 3X16 NADH (GAUZE/BANDAGES/DRESSINGS) ×3 IMPLANT
DRSG PAD ABDOMINAL 8X10 ST (GAUZE/BANDAGES/DRESSINGS) ×6 IMPLANT
ELECT REM PT RETURN 9FT ADLT (ELECTROSURGICAL) ×3
ELECTRODE REM PT RTRN 9FT ADLT (ELECTROSURGICAL) ×1 IMPLANT
GAUZE SPONGE 4X4 12PLY STRL (GAUZE/BANDAGES/DRESSINGS) ×5 IMPLANT
GLOVE BIOGEL PI IND STRL 8.5 (GLOVE) ×1 IMPLANT
GLOVE BIOGEL PI INDICATOR 8.5 (GLOVE) ×2
GLOVE SURG ORTHO 9.0 STRL STRW (GLOVE) ×5 IMPLANT
KIT BASIN OR (CUSTOM PROCEDURE TRAY) ×3 IMPLANT
KIT STIMULAN RAPID CURE  10CC (Orthopedic Implant) ×2 IMPLANT
KIT STIMULAN RAPID CURE 10CC (Orthopedic Implant) IMPLANT
MANIFOLD NEPTUNE II (INSTRUMENTS) ×3 IMPLANT
NS IRRIG 1000ML POUR BTL (IV SOLUTION) ×3 IMPLANT
PACK GENERAL/GYN (CUSTOM PROCEDURE TRAY) ×1 IMPLANT
PACK ORTHO EXTREMITY (CUSTOM PROCEDURE TRAY) ×5 IMPLANT
PAD ABD 8X10 STRL (GAUZE/BANDAGES/DRESSINGS) ×4 IMPLANT
POSITIONER SURGICAL ARM (MISCELLANEOUS) ×3 IMPLANT
STAPLER VISISTAT 35W (STAPLE) ×2 IMPLANT
STRIP CLOSURE SKIN 1/2X4 (GAUZE/BANDAGES/DRESSINGS) IMPLANT
SUT ETHILON 2 0 PSLX (SUTURE) ×4 IMPLANT
SUT MNCRL AB 4-0 PS2 18 (SUTURE) IMPLANT
SUT VIC AB 1 CT1 27 (SUTURE)
SUT VIC AB 1 CT1 27XBRD ANTBC (SUTURE) ×1 IMPLANT
SUT VIC AB 2-0 CT1 27 (SUTURE)
SUT VIC AB 2-0 CT1 TAPERPNT 27 (SUTURE) ×1 IMPLANT
SWAB COLLECTION DEVICE MRSA (MISCELLANEOUS) ×2 IMPLANT
TUBE ANAEROBIC SPECIMEN COL (MISCELLANEOUS) ×2 IMPLANT
WATER STERILE IRR 1500ML POUR (IV SOLUTION) ×1 IMPLANT

## 2014-09-02 NOTE — Anesthesia Postprocedure Evaluation (Signed)
  Anesthesia Post-op Note  Patient: Monique [name]  Procedure(s) Performed: Procedure(s): Removal Right Lateral Ankle Hardware, Place Antibiotic Bead  (Right)  Patient Location: PACU  Anesthesia Type:General  Level of Consciousness: awake, alert , oriented and patient cooperative  Airway and Oxygen Therapy: Patient Spontanous Breathing  Post-op Pain: mild  Post-op Assessment: Post-op Vital signs reviewed, Patient's Cardiovascular Status Stable, Respiratory Function Stable, Patent Airway, No signs of Nausea or vomiting and Pain level controlled              Post-op Vital Signs: Reviewed and stable  Last Vitals:  Filed Vitals:   09/02/14 1944  BP: 117/62  Pulse: 85  Temp: 36.5 C  Resp: 16    Complications: No apparent anesthesia complications
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
## 2014-09-02 NOTE — Progress Notes (Signed)
INFECTIOUS DISEASE PROGRESS NOTE  ID: Monique [name] is a 39 y.o. female with  Principal Problem:   Acute encephalopathy Active Problems:   Drug abuse   Cocaine abuse   Acute respiratory failure with hypoxia  Subjective: Resting qeuitly Wants to know if she will have cast  Abtx:  Anti-infectives    Start     Dose/Rate Route Frequency Ordered Stop   08/30/14 1600  cefTRIAXone (ROCEPHIN) 1 g in dextrose 5 % 50 mL IVPB     1 g 100 mL/hr over 30 Minutes Intravenous Every 24 hours 08/29/14 2025     08/30/14 1300  fluconazole (DIFLUCAN) tablet 100 mg  Status:  Discontinued     100 mg Oral Daily 08/30/14 1143 09/02/14 0829   08/29/14 2200  doxycycline (VIBRA-TABS) tablet 100 mg     100 mg Oral Every 12 hours 08/29/14 2025     08/29/14 1530  cefTRIAXone (ROCEPHIN) 1 g in dextrose 5 % 50 mL IVPB     1 g 100 mL/hr over 30 Minutes Intravenous  Once 08/29/14 1523 08/29/14 1631      Medications:  Scheduled: . buPROPion  100 mg Oral BID  . calamine   Topical BID  . cefTRIAXone (ROCEPHIN)  IV  1 g Intravenous Q24H  . doxycycline  100 mg Oral Q12H  . nicotine  21 mg Transdermal Daily  . sodium chloride  3 mL Intravenous Q12H    Objective: Vital signs in last 24 hours: Temp:  [97.6 F (36.4 C)-98.3 F (36.8 C)] 97.6 F (36.4 C) (07/07 1455) Pulse Rate:  [83-92] 92 (07/07 1455) Resp:  [18-20] 18 (07/07 1455) BP: (107-119)/(58-69) 107/58 mmHg (07/07 1455) SpO2:  [97 %-100 %] 100 % (07/07 1455)   General appearance: alert, cooperative and no distress Extremities: lateral wound on RLE shows no d/c, subj tenderness. minimal erythema.   Lab Results  Recent Labs  08/31/14 1820  WBC 6.4  HGB 10.3*  HCT 33.8*  NA 136  K 4.0  CL 103  CO2 26  BUN 12  CREATININE 0.80   Liver Panel  Recent Labs  08/31/14 1820  PROT 7.1  ALBUMIN 3.4*  AST 15  ALT 18  ALKPHOS 50  BILITOT 0.2*   Sedimentation Rate  Recent Labs  09/01/14 1610  ESRSEDRATE 40*    C-Reactive Protein  Recent Labs  09/01/14 1610  CRP 1.5*    Microbiology: Recent Results (from the past 240 hour(s))  Urine culture     Status: None   Collection Time: 08/29/14  2:50 PM  Result Value Ref Range Status   Specimen Description URINE, CATHETERIZED  Final   Special Requests NONE  Final   Culture   Final    >=100,000 COLONIES/mL STAPHYLOCOCCUS AUREUS Performed at [facility]    Report Status 09/02/2014 FINAL  Final   Organism ID, Bacteria STAPHYLOCOCCUS AUREUS  Final      Susceptibility   Staphylococcus aureus - MIC*    CIPROFLOXACIN <=0.5 SENSITIVE Sensitive     GENTAMICIN <=0.5 SENSITIVE Sensitive     NITROFURANTOIN <=16 SENSITIVE Sensitive     OXACILLIN <=0.25 SENSITIVE Sensitive     TETRACYCLINE <=1 SENSITIVE Sensitive     VANCOMYCIN <=0.5 SENSITIVE Sensitive     TRIMETH/SULFA <=10 SENSITIVE Sensitive     CLINDAMYCIN <=0.25 SENSITIVE Sensitive     RIFAMPIN <=0.5 SENSITIVE Sensitive     Inducible Clindamycin NEGATIVE Sensitive     * >=100,000 COLONIES/mL STAPHYLOCOCCUS AUREUS  MRSA PCR  Screening     Status: None   Collection Time: 08/29/14  5:15 PM  Result Value Ref Range Status   MRSA by PCR NEGATIVE NEGATIVE Final    Comment:        The GeneXpert MRSA Assay (FDA approved for NASAL specimens only), is one component of a comprehensive MRSA colonization surveillance program. It is not intended to diagnose MRSA infection nor to guide or monitor treatment for MRSA infections.   Surgical pcr screen     Status: None   Collection Time: 09/01/14  7:00 AM  Result Value Ref Range Status   MRSA, PCR NEGATIVE NEGATIVE Final   Staphylococcus aureus NEGATIVE NEGATIVE Final    Comment:        The Xpert SA Assay (FDA approved for NASAL specimens in patients over 39 years of age), is one component of a comprehensive surveillance program.  Test performance has been validated by [facility] for patients greater than or equal to 39 year old. It is  not intended to diagnose infection nor to guide or monitor treatment.     Studies/Results: No results found.   Assessment/Plan: Septic Arthritis- prev MRSA IVDA Bipolar Tobacco dependence  Total days of antibiotics: 4 ceftriaxone/doxy  Would change her to vanco post-op.  Await her operative Cx but suspect she will have MRSA as previous (unless neagtive from her anbx).   Dr Monique [name] is available for questions on 7-8  HIV/RPR/GC/Chlamydia/Hepatitis B/C all negative         Monique SaxJeffrey Korrina [name] Infectious Diseases (pager) [phone] [email or web address] 09/02/2014, 3:14 PM  LOS: 4 days

## 2014-09-02 NOTE — Transfer of Care (Signed)
Immediate Anesthesia Transfer of Care Note  Patient: Monique [name]  Procedure(s) Performed: Procedure(s): Removal Right Lateral Ankle Hardware, Place Antibiotic Bead  (Right)  Patient Location: PACU  Anesthesia Type:General  Level of Consciousness: awake, oriented, patient cooperative, lethargic and responds to stimulation  Airway & Oxygen Therapy: Patient Spontanous Breathing and Patient connected to face mask oxygen  Post-op Assessment: Report given to RN, Post -op Vital signs reviewed and stable and Patient moving all extremities  Post vital signs: Reviewed and stable  Last Vitals:  Filed Vitals:   09/02/14 1455  BP: 107/58  Pulse: 92  Temp: 36.4 C  Resp: 18    Complications: No apparent anesthesia complications

## 2014-09-02 NOTE — Op Note (Signed)
08/29/2014 - 09/02/2014  6:18 PM  PATIENT:  Monique [name]    PRE-OPERATIVE DIAGNOSIS:  Infected Right Ankle Hardware  POST-OPERATIVE DIAGNOSIS:  Same  PROCEDURE:  Removal Right Lateral Ankle Hardware, Place Antibiotic Bead local tissue rearrangement for wound closure. 10 x 5 cm  SURGEON:  DUDA,MARCUS V, MD  PHYSICIAN ASSISTANT:None ANESTHESIA:   General  PREOPERATIVE INDICATIONS:  Monique [name] is a  39 y.o. female with a diagnosis of Infected Right Ankle Hardware who failed conservative measures and elected for surgical management.    The risks benefits and alternatives were discussed with the patient preoperatively including but not limited to the risks of infection, bleeding, nerve injury, cardiopulmonary complications, the need for revision surgery, among others, and the patient was willing to proceed.  OPERATIVE IMPLANTS: Interbody beads with vancomycin 1 g 10 mL stimulant beads  Cultures obtained 2.  OPERATIVE FINDINGS: Cystic masses anterior to the ankle but no purulence most likely resolving septic arthritis  OPERATIVE PROCEDURE: Patient was brought to the operating room and underwent a general and aesthetic. After adequate levels anesthesia obtained patient's right lower extremity was prepped using DuraPrep draped into a sterile field. A timeout was called. A lateral incision was made over the fibular plate this was carried down to the fibula. There was some cystic nonviable tissue around the plate this was removed. The plate and screws were removed. There was no gross purulence. Attention was then focused over the medial malleolus the previous incision was used this was carried down to the hardware and again the hardware was removed without complications. An anterior incision was made this was carried down to the retinaculum retinaculum was incised the vessels were retracted medially. There was cystic necrotic tissue anterior the ankle most likely due to resolving septic  arthritis but no gross purulence. There was some destructive changes of the articular surface. All wounds were irrigated with normal saline. All 3 areas were then packed with the antibiotics beads with beads packed within the ankle joint medial and lateral malleolus. The incisions were loosely closed using 2-0 nylon with local tissue rearrangement to close these wounds due to a soft tissue excision. Total volume of wound closure 10 cm x 5 cm. Sterile compressive dressing was applied. Patient was extubated taken to the PACU in stable condition. Plan for IV antibiotics as per infectious disease I will follow-up the office in 2 weeks.

## 2014-09-02 NOTE — Anesthesia Procedure Notes (Signed)
Procedure Name: Intubation Date/Time: 09/02/2014 5:30 PM Performed by: Edison PaceGRAY, Alisabeth Selkirk E Pre-anesthesia Checklist: Patient identified, Emergency Drugs available, Timeout performed, Suction available and Patient being monitored Patient Re-evaluated:Patient Re-evaluated prior to inductionOxygen Delivery Method: Circle system utilized Preoxygenation: Pre-oxygenation with 100% oxygen Intubation Type: IV induction and Cricoid Pressure applied Ventilation: Mask ventilation without difficulty Laryngoscope Size: Mac and 4 Grade View: Grade II Tube type: Oral Tube size: 7.5 mm Number of attempts: 2 Airway Equipment and Method: Stylet Placement Confirmation: ETT inserted through vocal cords under direct vision,  positive ETCO2 and breath sounds checked- equal and bilateral Secured at: 21 cm Tube secured with: Tape Dental Injury: Teeth and Oropharynx as per pre-operative assessment  Difficulty Due To: Difficulty was anticipated, Difficult Airway- due to large tongue, Difficult Airway- due to reduced neck mobility, Difficult Airway- due to dentition, Difficult Airway- due to limited oral opening and Difficult Airway- due to anterior larynx Future Recommendations: Recommend- induction with short-acting agent, and alternative techniques readily available

## 2014-09-02 NOTE — Progress Notes (Signed)
IV Nurse entered pt room to start IV. Witnessed pt unhooking IV tubing as she went to the bathroom and attempted to hook the IV back up. IV nurse stopped her and explained to her the risk of infection. I also talked to her about the risk of infection and explained that the nursing staff was here to help her go to the bathroom if she cannot manage the IV pump. Will continue to monitor.

## 2014-09-03 LAB — HIV ANTIBODY (ROUTINE TESTING W REFLEX): HIV Screen 4th Generation wRfx: NONREACTIVE

## 2014-09-03 LAB — GLUCOSE, CAPILLARY: Glucose-Capillary: 99 mg/dL (ref 65–99)

## 2014-09-03 MED ORDER — VANCOMYCIN HCL IN DEXTROSE 1-5 GM/200ML-% IV SOLN
1000.0000 mg | Freq: Three times a day (TID) | INTRAVENOUS | Status: DC
  Administered 2014-09-03 – 2014-09-04 (×5): 1000 mg via INTRAVENOUS
  Filled 2014-09-03 (×6): qty 200

## 2014-09-03 NOTE — Progress Notes (Signed)
Patient ID: Monique [name], female   DOB: 11/28/1975, 39 y.o.   MRN: [id number] No acute changes.  Dr. Lajoyce Cornersuda reported that surgery last evening was successful in terms of hardware removal from her right ankle and placement of antibiotic beads.  From his standpoint, she can be discharged today or tomorrow on antibiotics per Infectious Disease and follow-up with him in 2 weeks.

## 2014-09-03 NOTE — Progress Notes (Signed)
ANTIBIOTIC CONSULT NOTE - INITIAL  Pharmacy Consult for vancomycin Indication: septic arthritis/osteomyelitis  No Known Allergies  Patient Measurements: Height: 5\' 3"  (160 cm) Weight: 202 lb 4.8 oz (91.763 kg) IBW/kg (Calculated) : 52.4 Adjusted Body Weight:   Vital Signs: Temp: 97.7 F (36.5 C) (07/08 0608) Temp Source: Oral (07/08 13080608) BP: 115/69 mmHg (07/08 65780608) Pulse Rate: 71 (07/08 0608) Intake/Output from previous day: 07/07 0701 - 07/08 0700 In: 2784.7 [I.V.:2734.7; IV Piggyback:50] Out: 1750 [Urine:1750] Intake/Output from this shift:    Labs:  Recent Labs  08/31/14 1820  WBC 6.4  HGB 10.3*  PLT 372  CREATININE 0.80   Estimated Creatinine Clearance: 102.7 mL/min (by C-G formula based on Cr of 0.8). No results for input(s): VANCOTROUGH, VANCOPEAK, VANCORANDOM, GENTTROUGH, GENTPEAK, GENTRANDOM, TOBRATROUGH, TOBRAPEAK, TOBRARND, AMIKACINPEAK, AMIKACINTROU, AMIKACIN in the last 72 hours.   Microbiology: Recent Results (from the past 720 hour(s))  Urine culture     Status: None   Collection Time: 08/29/14  2:50 PM  Result Value Ref Range Status   Specimen Description URINE, CATHETERIZED  Final   Special Requests NONE  Final   Culture   Final    >=100,000 COLONIES/mL STAPHYLOCOCCUS AUREUS [facility]formed [facility]    Report Status 09/02/2014 FINAL  Final   Organism ID, Bacteria STAPHYLOCOCCUS AUREUS  Final      Susceptibility   Staphylococcus aureus - MIC*    CIPROFLOXACIN <=0.5 SENSITIVE Sensitive     GENTAMICIN <=0.5 SENSITIVE Sensitive     NITROFURANTOIN <=16 SENSITIVE Sensitive     OXACILLIN <=0.25 SENSITIVE Sensitive     TETRACYCLINE <=1 SENSITIVE Sensitive     VANCOMYCIN <=0.5 SENSITIVE Sensitive     TRIMETH/SULFA <=10 SENSITIVE Sensitive     CLINDAMYCIN <=0.25 SENSITIVE Sensitive     RIFAMPIN <=0.5 SENSITIVE Sensitive     Inducible Clindamycin NEGATIVE Sensitive     * >=100,000 COLONIES/mL STAPHYLOCOCCUS AUREUS  MRSA PCR Screening      Status: None   Collection Time: 08/29/14  5:15 PM  Result Value Ref Range Status   MRSA by PCR NEGATIVE NEGATIVE Final    Comment:        The GeneXpert MRSA Assay (FDA approved for NASAL specimens only), is one component of a comprehensive MRSA colonization surveillance program. It is not intended to diagnose MRSA infection nor to guide or monitor treatment for MRSA infections.   Surgical pcr screen     Status: None   Collection Time: 09/01/14  7:00 AM  Result Value Ref Range Status   MRSA, PCR NEGATIVE NEGATIVE Final   Staphylococcus aureus NEGATIVE NEGATIVE Final    Comment:        The Xpert SA Assay (FDA approved for NASAL specimens in patients over 39 years of age), is one component of a comprehensive surveillance program.  Test performance has been validated by [facility] for patients greater than or equal to 39 year old. It is not intended to diagnose infection nor to guide or monitor treatment.     Medical History: Past Medical History  Diagnosis Date  . MRSA infection   . Bipolar disorder    Assessment: [age] YOF with h/o infected hardware of R ankle, taken to OR 7/7 for placement of antibiotic beads and removal of hardware.  She has known previous septic arthritis of this ankle with MRSA. She also has h/o IVDA.  Pharmacy asked to dose vancomycin post-op based on previous h/o MRSA.   7/3 >> doxycycline >> 7/4 >>ceftriaxone  >>  7/8 >>vancomycin  >>    7/3 urine:>100k  MSSA 7/7 wound culture   Renal fx: Norm CrCl = 176ml/min  Dose changes/levels:  Goal of Therapy:  Vancomycin trough level 15-20 mcg/ml  Plan:   Start vancomycin 1gm IV q8h  Suggest check vancomycin trough Sunday w/ morning labs  Await intra-op cultures  Suggest stop doxycycline (and ceftriaxone if MRSA found)  Juliette Alcide, PharmD, BCPS.   Pager: [phone]  09/03/2014,8:44 AM

## 2014-09-03 NOTE — Evaluation (Signed)
Physical Therapy Evaluation Patient Details Name: Monique [name] MRN: [id number] DOB: 09/16/1975 Today's Date: 09/03/2014   History of Present Illness  infected R ORIF of ankle, removal hardware and placement of antibiotic beads. on 7/7 .  Clinical Impression  Patient up in room without AD. Provided RW and encouraged patient call for assistance. Patient reports having no  Clothes or shoes and uncertain for DC plan. Patient will benefit from PT to address problems listed in note below. Strongly encouraged patient to maintain NWB. Note that a CAM boot was in order from DR. Duda post op. Ortho tech notified.    Follow Up Recommendations No PT follow up    Equipment Recommendations  Rolling walker with 5" wheels    Recommendations for Other Services       Precautions / Restrictions Precautions Precautions: Fall Required Braces or Orthoses: Other Brace/Splint Other Brace/Splint: cam boot Restrictions RLE Weight Bearing: Non weight bearing      Mobility  Bed Mobility Overal bed mobility: Independent                Transfers Overall transfer level: Modified independent Equipment used: None             General transfer comment: patient in bathroom, had "hopped" with IV pole per patient. Patient standing without weight on R leg. provided RW for patient  for safety  To ambulate to bed.  Ambulation/Gait Ambulation/Gait assistance: Min guard Ambulation Distance (Feet): 20 Feet Assistive device: Rolling walker (2 wheeled) Gait Pattern/deviations: Step-to pattern;Staggering right;Staggering left     General Gait Details: gait is very unsteady, multimodal cues for safety and strict NWB on R.  Stairs            Wheelchair Mobility    Modified Rankin (Stroke Patients Only)       Balance Overall balance assessment: Needs assistance         Standing balance support: During functional activity;Bilateral upper extremity supported Standing balance-Leahy  Scale: Fair Standing balance comment: does better if she is paying attention                             Pertinent Vitals/Pain Pain Assessment: 0-10 Pain Score: 5  Pain Location: R ankle Pain Descriptors / Indicators: Aching;Burning Pain Intervention(s): Limited activity within patient's tolerance    Home Living                   Additional Comments: homeless per patient    Prior Function Level of Independence: Independent               Hand Dominance        Extremity/Trunk Assessment   Upper Extremity Assessment: Overall WFL for tasks assessed           Lower Extremity Assessment: RLE deficits/detail RLE Deficits / Details: moves r leg  withouit issue.    Cervical / Trunk Assessment: Normal  Communication   Communication: No difficulties  Cognition Arousal/Alertness: Awake/alert Behavior During Therapy: Impulsive Overall Cognitive Status: Within Functional Limits for tasks assessed       Memory: Decreased recall of precautions              General Comments      Exercises        Assessment/Plan    PT Assessment Patient needs continued PT services  PT Diagnosis Difficulty walking;Acute pain   PT Problem List Decreased activity tolerance;Decreased balance;Decreased mobility;Decreased knowledge  of precautions;Decreased safety awareness;Decreased knowledge of use of DME;Pain  PT Treatment Interventions DME instruction;Gait training;Functional mobility training;Therapeutic activities;Therapeutic exercise;Patient/family education   PT Goals (Current goals can be found in the Care Plan section) Acute Rehab PT Goals Patient Stated Goal: to go somewhere.  PT Goal Formulation: With patient Time For Goal Achievement: 09/10/14 Potential to Achieve Goals: Good    Frequency Min 5X/week   Barriers to discharge Decreased caregiver support      Co-evaluation               End of Session   Activity Tolerance: Patient  tolerated treatment well Patient left: in bed;with call bell/phone within reach Nurse Communication: Mobility status         Time: 1022-1045 PT Time Calculation (min) (ACUTE ONLY): 23 min   Charges:   PT Evaluation $Initial PT Evaluation Tier I: 1 Procedure PT Treatments $Gait Training: 8-22 mins   PT G Codes:        Rada Hay 09/03/2014, 1:43 PM Blanchard Kelch PT [phone]
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
## 2014-09-04 LAB — CBC
HCT: 33.2 % — ABNORMAL LOW (ref 36.0–46.0)
HEMOGLOBIN: 10.3 g/dL — AB (ref 12.0–15.0)
MCH: 25.6 pg — ABNORMAL LOW (ref 26.0–34.0)
MCHC: 31 g/dL (ref 30.0–36.0)
MCV: 82.4 fL (ref 78.0–100.0)
Platelets: 340 10*3/uL (ref 150–400)
RBC: 4.03 MIL/uL (ref 3.87–5.11)
RDW: 15.8 % — AB (ref 11.5–15.5)
WBC: 6.9 10*3/uL (ref 4.0–10.5)

## 2014-09-04 LAB — BASIC METABOLIC PANEL
ANION GAP: 8 (ref 5–15)
BUN: 14 mg/dL (ref 6–20)
CO2: 27 mmol/L (ref 22–32)
Calcium: 9.3 mg/dL (ref 8.9–10.3)
Chloride: 98 mmol/L — ABNORMAL LOW (ref 101–111)
Creatinine, Ser: 0.81 mg/dL (ref 0.44–1.00)
GFR calc Af Amer: >60 mL/min (ref >60)
Glucose, Bld: 109 mg/dL — ABNORMAL HIGH (ref 65–99)
Potassium: 3.8 mmol/L (ref 3.5–5.1)
SODIUM: 133 mmol/L — AB (ref 135–145)

## 2014-09-04 LAB — GLUCOSE, CAPILLARY: Glucose-Capillary: 116 mg/dL — ABNORMAL HIGH (ref 65–99)

## 2014-09-04 MED ORDER — OXYCODONE HCL 5 MG PO TABS
10.0000 mg | ORAL_TABLET | ORAL | Status: DC | PRN
  Administered 2014-09-04 – 2014-09-05 (×6): 10 mg via ORAL
  Filled 2014-09-04 (×7): qty 2

## 2014-09-04 MED ORDER — ALPRAZOLAM 1 MG PO TABS
1.0000 mg | ORAL_TABLET | Freq: Three times a day (TID) | ORAL | Status: DC
  Administered 2014-09-04 – 2014-09-06 (×8): 1 mg via ORAL
  Filled 2014-09-04 (×8): qty 1
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
## 2014-09-04 NOTE — Progress Notes (Signed)
PT Cancellation Note  Patient Details Name: Sherron FlemingsKimberly E Kathan MRN: [id number] DOB: 09/15/1975   Cancelled Treatment:    Reason Eval/Treat Not Completed: Other (comment)patient  Asleep. Noted CAM boot in  It's package beside bed. RW beside her bed. Dr. Audrie Liauda's order  Clarified CAM boot.    Rada HayHill, Mekiyah Gladwell Elizabeth 09/04/2014, 4:49 PM Blanchard KelchKaren Parvin Stetzer PT [phone]

## 2014-09-05 LAB — WOUND CULTURE
Culture: NO GROWTH
Gram Stain: NONE SEEN

## 2014-09-05 LAB — VANCOMYCIN, TROUGH: Vancomycin Tr: 30 ug/mL (ref 10.0–20.0)

## 2014-09-05 LAB — GLUCOSE, CAPILLARY: GLUCOSE-CAPILLARY: 100 mg/dL — AB (ref 65–99)

## 2014-09-05 MED ORDER — HYDROMORPHONE HCL 2 MG PO TABS
1.0000 mg | ORAL_TABLET | ORAL | Status: DC | PRN
  Administered 2014-09-05 (×2): 1 mg via ORAL
  Filled 2014-09-05 (×2): qty 1

## 2014-09-05 MED ORDER — OXYCODONE-ACETAMINOPHEN 5-325 MG PO TABS
1.0000 | ORAL_TABLET | ORAL | Status: DC | PRN
  Administered 2014-09-05 – 2014-09-06 (×6): 1 via ORAL
  Filled 2014-09-05 (×6): qty 1

## 2014-09-05 MED ORDER — VANCOMYCIN HCL IN DEXTROSE 1-5 GM/200ML-% IV SOLN
1000.0000 mg | Freq: Two times a day (BID) | INTRAVENOUS | Status: DC
  Administered 2014-09-05 (×2): 1000 mg via INTRAVENOUS
  Filled 2014-09-05 (×3): qty 200

## 2014-09-05 MED ORDER — CEFTRIAXONE SODIUM IN DEXTROSE 40 MG/ML IV SOLN
2.0000 g | INTRAVENOUS | Status: DC
  Filled 2014-09-05: qty 50

## 2014-09-05 MED ORDER — HYDROMORPHONE HCL 1 MG/ML IJ SOLN
1.0000 mg | INTRAMUSCULAR | Status: DC | PRN
  Administered 2014-09-05 – 2014-09-06 (×5): 1 mg via INTRAVENOUS
  Filled 2014-09-05 (×5): qty 1

## 2014-09-05 NOTE — Progress Notes (Signed)
Physical Therapy Treatment Patient Details Name: Monique [name] MRN: [id number] DOB: 04-Jan-1976 Today's Date: 09/05/2014    History of Present Illness infected R ORIF of ankle, removal hardware and placement of antibiotic beads. on 7/7 .    PT Comments    Pt in bed with boyfriend resting.  Pt pleasant.  Applied cast shoe to R foot.  Pt aware she is NWBing.  Pt stated she is able to get OOB to [facility] on her own then back to bed.  Assisted with amb (hopping) in hallway with RW.  Pt does well to maintain NWB ing however requires MAX VC's to decrease speed for increase safety.  Unsteady, choppy.  Potential fall risk.   Follow Up Recommendations  No PT follow up     Equipment Recommendations  Rolling walker with 5" wheels    Recommendations for Other Services       Precautions / Restrictions Precautions Precautions: Fall Required Braces or Orthoses: Other Brace/Splint Other Brace/Splint: cam boot ordered 09/02/14 but still in plastic bag sitting in corner of room untouched.  Restrictions Weight Bearing Restrictions: Yes RLE Weight Bearing: Non weight bearing    Mobility  Bed Mobility Overal bed mobility: Independent             General bed mobility comments: in and getting self OOB.    Transfers Overall transfer level: Modified independent Equipment used: None;Rolling walker (2 wheeled)             General transfer comment: pt stated she is able to get self to and from [facility] on her own.   Ambulation/Gait Ambulation/Gait assistance: Min guard Ambulation Distance (Feet): 38 Feet Assistive device: Rolling walker (2 wheeled) Gait Pattern/deviations: Step-to pattern     General Gait Details: MAX VC's to decrease gait (hop) speed to increase safety.  Pt impulsive and quick to "get it done" so she can go back to bed and elevate her foot.  Does well to maintain NWB.  Good upper body strength.    Stairs Stairs:  (no stairs to enter [employer])          Landscape architect [place] (Stroke Patients Only)       Balance                                    Cognition Arousal/Alertness: Awake/alert Behavior During Therapy: Impulsive Overall Cognitive Status: Within Functional Limits for tasks assessed                      Exercises      General Comments        Pertinent Vitals/Pain Pain Assessment: 0-10 Pain Score: 7  Pain Location: R ankle esp when down in dependent postion Pain Descriptors / Indicators: Aching;Constant;Burning Pain Intervention(s): Monitored during session;Patient requesting pain meds-RN notified;Repositioned (elevation)    Home Living                      Prior Function            PT Goals (current goals can now be found in the care plan section) Progress towards PT goals: Progressing toward goals    Frequency  Min 5X/week    PT Plan      Co-evaluation             End of Session Equipment Utilized During Treatment: Gait belt  Activity Tolerance: Patient tolerated treatment well Patient left: in bed;with call bell/phone within reach     Time: 1120-1130 PT Time Calculation (min) (ACUTE ONLY): 10 min  Charges:  $Gait Training: 8-22 mins                    G Codes:      Monique [name]  PTA [facility]  Acute  Rehab Pager      [phone]
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
## 2014-09-05 NOTE — Progress Notes (Signed)
ID RECS:  Will treat with 6 wk of vancomycin plus ceftriaxone 2 gm IV daily for culture negative ankle HW infection: - use day 1 of 42, as July 8th, with August 19th the last day of antibiotics - will need weekly vanco trough, goal of 15-20, weekly cbc, weekly bmp, dose vanco per pharmacy protocol - will have her follow up in the ID clinic in 5-6 wk with Dr. Ninetta LightsHatcher.  Call if questions.  Duke Salviaynthia B. Drue SecondSnider MD MPH [facility] [phone]

## 2014-09-05 NOTE — Progress Notes (Signed)
CRITICAL VALUE ALERT  Critical value received:  Vancomycin=30  Date of notification:  09/05/14  Time of notification:  05:48am  Critical value read back:Yes.    Nurse who received alert:  Tamala Barienise Kyrstin Campillo,RN  MD notified (1st page):  Valere DrossJulian Grimsley,[facility]  Time of first page:  05:50am  MD notified (2nd page):05:50am-Pharmacist notified  Time of second page:  Responding MD: Valere DrossJulian Grimsley,[facility]  Time MD responded:  05:50am--Orders were to not give Vancomycin @ 6am this am(09/05/14)--The pt's RN Linward NatalLindsay Hudson  Notified.

## 2014-09-05 NOTE — Progress Notes (Signed)
Patient ID: Monique FlemingsKimberly E [name], female   DOB: 05/14/1975, 39 y.o.   MRN: [id number] [facility] PROGRESS NOTE  Monique FlemingsKimberly E [name] WUJ:[id number] DOB: 09/13/1975 DOA: 08/29/2014 PCP: Patient will follow with community health [facility] wellness clinic on discharge.  Brief narrative:    39 y.o. Female with past medical history of drug abuse, prostitution who [facility] with altered mental status. She was found unresponsive while eating and when EMS arrived they gave her a narcan but with no significant changes in mental status.   In ED, she was found to have T of 99.5 F, HR 96, RR 25, BP 81/45, oxygen saturation of 89% on room air but this has improved with [place] oxygen support. UA showed small leukocytes and many bacteria and she was started on empiric rocephin for possible UTI. Alcohol level was WNL and UDS was positive for cocaine, benzos and THC. CT head did not show acute intracranial findings. Due to her initial presentation of unresponsiveness she was admitted to stepdown unit. Patient was transferred to telemetry floor 08/30/2014.  Patient was transferred out of the stepdown unit the 08/30/2014. Her mental status is at baseline. Hospital course is complicated with finding of septic arthritis and osteomyelitis in the right tibiotalar joint. Orthopedic surgery has seen the patient in consultation. Patient underwent surgery for removal of old hardware done by Dr. Aldean BakerMarcus Duda or orthopedic surgery on 09/02/2014.  Barrier to discharge: Discharge 09/06/2014; awaiting ID recommendations.    Assessment/Plan:    Principal Problem:  Acute encephalopathy / Drug overdose / Drug abuse - UDS positive for cocaine, benzos and THC on admission. - Mental status at baseline  - HIV negative. GC chlamydia is negative. RPR is nonreactive. Acute hepatitis panel is negative. - Counseled on drug abuse.   Active Problems:  Acute respiratory failure with hypoxia - Likely from drug overdose - Stable     UTI - UA on admission showed leukocytes and many bacteria. Preliminary urine culture result is positive for staph aureus species. -  Since GC chlamydia is negative doxycycline stopped 09/03/2014  - Fluconazole started for yeast infection. Received it for 4 days. Stopped 09/02/2014.   RLE cellulitis / septic arthritis / chronic osteomyelitis of the tibiotalar joint  - X-ray of the right ankle on the admission did not reveal acute findings. X-ray was repeated the following day and demonstrated possibly acute infection. - MRI of the right ankle 08/30/2014 demonstrate a septic arthritis and osteomyelitis of the tibiotalar joint. - S/P removal of all hardware 09/02/2014   - On vanco and rocephin  - Wound culture results - so far no growth  - ID to make recommendations in regards to abx on discharge     Tobacco abuse - Continue nicotine patch and Wellbutrin 100 mg twice daily   DVT prophylaxis:  - SCD's bilaterally    Code Status: Full.  Family Communication:  plan of care discussed with the patient Disposition Plan: Discharge likely Monday 09/06/2014   IV access:  Peripheral IV  Procedures and diagnostic studies:    Mr Ankle Right  Wo Contrast 08/31/2014   MR findings most consistent with septic arthritis and osteomyelitis involving the tibiotalar joint. Recommend joint aspiration and orthopedic consultation.    Dg Chest 1 View 08/29/2014  Low lung volumes without focal disease.     Dg Ankle 2 Views Right 08/30/2014  1. Lucency around fibular fixation hardware suggesting loosening or infection.    Dg Ankle Complete Right 08/29/2014   No acute  osseous abnormality. Old bimalleolar ORIF with loose hardware.    Ct Head Wo Contrast 08/29/2014    No focal acute intracranial abnormality identified.   Electronically Signed   By: Sherian Rein M.D.   On: 08/29/2014 14:03    Medical Consultants:  Orthopedic surgery, Dr. Aldean Baker  ID, Dr. Johny Sax   Other Consultants:  None    IAnti-Infectives:   Rocephin 08/29/2014 -->  Vancomycin 09/03/2014 --> Doxycycline 08/29/2014 --> 09/03/2014  Fluconazole 08/30/2014 --> 09/02/2014    Manson Passey, MD  [facility] Pager [phone]  Time spent in minutes: 15 minutes  If 7PM-7AM, please contact night-coverage [email or web address] Password [facility] 09/05/2014, 3:54 PM   LOS: 7 days    HPI/Subjective: No acute overnight events. Patient reports feeling better this am.   Objective: Filed Vitals:   09/04/14 1322 09/04/14 2000 09/05/14 0539 09/05/14 1416  BP: 115/61 107/56 112/56 116/64  Pulse: 84 87 75 83  Temp: 98.5 F (36.9 C) 98.4 F (36.9 C) 98.6 F (37 C) 98.3 F (36.8 C)  TempSrc: Oral Oral Oral Oral  Resp: 18 20 20 18   Height:      Weight:      SpO2: 97% 98% 100% 97%    Intake/Output Summary (Last 24 hours) at 09/05/14 1554 Last data filed at 09/05/14 1416  Gross per 24 hour  Intake 907.67 ml  Output   1700 ml  Net -792.33 ml    Exam:   General:  Pt is alert, awake  Cardiovascular: rate controlled, appreciate S1, S2  Respiratory: no wheezing, no crackles   Abdomen: Nontender abdomen, (+) BS  Extremities: Dressing in place over right LE, left lower extremity pulses palpable  Neuro: Nonfocal  Data Reviewed: Basic Metabolic Panel:  Recent Labs Lab 08/30/14 0350 08/31/14 1820 09/04/14 1233  NA 139 136 133*  K 3.8 4.0 3.8  CL 105 103 98*  CO2 28 26 27   GLUCOSE 110* 114* 109*  BUN 15 12 14   CREATININE 0.75 0.80 0.81  CALCIUM 8.4* 8.8* 9.3   Liver Function Tests:  Recent Labs Lab 08/30/14 0350 08/31/14 1820  AST 17 15  ALT 19 18  ALKPHOS 53 50  BILITOT 0.5 0.2*  PROT 6.2* 7.1  ALBUMIN 3.1* 3.4*   No results for input(s): LIPASE, AMYLASE in the last 168 hours. No results for input(s): AMMONIA in the last 168 hours. CBC:  Recent Labs Lab 08/30/14 0350 08/31/14 1820 09/04/14 1233  WBC 5.4 6.4 6.9  NEUTROABS  --  3.2  --   HGB 9.7* 10.3* 10.3*  HCT 31.9* 33.8* 33.2*   MCV 83.7 84.3 82.4  PLT 340 372 340   Cardiac Enzymes: No results for input(s): CKTOTAL, CKMB, CKMBINDEX, TROPONINI in the last 168 hours. BNP: Invalid input(s): POCBNP CBG:  Recent Labs Lab 09/01/14 0730 09/02/14 0732 09/03/14 0759 09/04/14 0738 09/05/14 0733  GLUCAP 92 97 99 116* 100*    Recent Results (from the past 240 hour(s))  MRSA PCR Screening     Status: None   Collection Time: 08/29/14  5:15 PM  Result Value Ref Range Status   MRSA by PCR NEGATIVE NEGATIVE Final    . ALPRAZolam  1 mg Oral Q8H  . buPROPion  100 mg Oral BID  . calamine   Topical BID  . cefTRIAXone (ROCEPHIN)  IV  1 g Intravenous Q24H  . nicotine  21 mg Transdermal Daily  . sodium chloride  3 mL Intravenous Q12H  . vancomycin  1,000 mg Intravenous  Q12H

## 2014-09-05 NOTE — Progress Notes (Addendum)
ANTIBIOTIC CONSULT NOTE - FOLLOW UP  Pharmacy Consult for Vancomycin Indication: septic arthritis/osteomyelitis  No Known Allergies  Patient Measurements: Height: 5\' 3"  (160 cm) Weight: 202 lb 4.8 oz (91.763 kg) IBW/kg (Calculated) : 52.4 Adjusted Body Weight:   Vital Signs: Temp: 98.6 F (37 C) (07/10 0539) Temp Source: Oral (07/10 0539) BP: 112/56 mmHg (07/10 0539) Pulse Rate: 75 (07/10 0539) Intake/Output from previous day: 07/09 0701 - 07/10 0700 In: 830 [P.O.:360; I.V.:220; IV Piggyback:250] Out: 1500 [Urine:1500] Intake/Output from this shift: Total I/O In: 350 [P.O.:240; I.V.:110] Out: 700 [Urine:700]  Labs:  Recent Labs  09/04/14 1233  WBC 6.9  HGB 10.3*  PLT 340  CREATININE 0.81   Estimated Creatinine Clearance: 101.4 mL/min (by C-G formula based on Cr of 0.81).  Recent Labs  09/05/14 0517  VANCOTROUGH 30*     Microbiology: Recent Results (from the past 720 hour(s))  Urine culture     Status: None   Collection Time: 08/29/14  2:50 PM  Result Value Ref Range Status   Specimen Description URINE, CATHETERIZED  Final   Special Requests NONE  Final   Culture   Final    >=100,000 COLONIES/mL STAPHYLOCOCCUS AUREUS [facility]    Report Status 09/02/2014 FINAL  Final   Organism ID, Bacteria STAPHYLOCOCCUS AUREUS  Final      Susceptibility   Staphylococcus aureus - MIC*    CIPROFLOXACIN <=0.5 SENSITIVE Sensitive     GENTAMICIN <=0.5 SENSITIVE Sensitive     NITROFURANTOIN <=16 SENSITIVE Sensitive     OXACILLIN <=0.25 SENSITIVE Sensitive     TETRACYCLINE <=1 SENSITIVE Sensitive     VANCOMYCIN <=0.5 SENSITIVE Sensitive     TRIMETH/SULFA <=10 SENSITIVE Sensitive     CLINDAMYCIN <=0.25 SENSITIVE Sensitive     RIFAMPIN <=0.5 SENSITIVE Sensitive     Inducible Clindamycin NEGATIVE Sensitive     * >=100,000 COLONIES/mL STAPHYLOCOCCUS AUREUS  MRSA PCR Screening     Status: None   Collection Time: 08/29/14  5:15 PM  Result Value Ref  Range Status   MRSA by PCR NEGATIVE NEGATIVE Final    Comment:        The GeneXpert MRSA Assay (FDA approved for NASAL specimens only), is one component of a comprehensive MRSA colonization surveillance program. It is not intended to diagnose MRSA infection nor to guide or monitor treatment for MRSA infections.   Surgical pcr screen     Status: None   Collection Time: 09/01/14  7:00 AM  Result Value Ref Range Status   MRSA, PCR NEGATIVE NEGATIVE Final   Staphylococcus aureus NEGATIVE NEGATIVE Final    Comment:        The Xpert SA Assay (FDA approved for NASAL specimens in patients over 421 years of age), is one component of a comprehensive surveillance program.  Test performance has been validated by [facility] for patients greater than or equal to 39 year old. It is not intended to diagnose infection nor to guide or monitor treatment.   Anaerobic culture     Status: None (Preliminary result)   Collection Time: 09/02/14  6:00 PM  Result Value Ref Range Status   Specimen Description ANKLE RIGHT  Final   Special Requests PATIENT ON FOLLOWING ROCEFIN  Final   Gram Stain   Final    NO WBC SEEN NO SQUAMOUS EPITHELIAL CELLS SEEN NO ORGANISMS SEEN Performed at [facility]    Culture   Final    NO ANAEROBES ISOLATED; CULTURE IN PROGRESS  FOR 5 DAYS Performed at [facility]    Report Status PENDING  Incomplete  Wound culture     Status: None (Preliminary result)   Collection Time: 09/02/14  6:00 PM  Result Value Ref Range Status   Specimen Description ANKLE RIGHT  Final   Special Requests PATIENT ON FOLLOWING ROCEFIN  Final   Gram Stain   Final    NO WBC SEEN NO SQUAMOUS EPITHELIAL CELLS SEEN NO ORGANISMS SEEN Performed at [facility]    Culture   Final    NO GROWTH 1 DAY Performed at [facility]    Report Status PENDING  Incomplete    Anti-infectives    Start     Dose/Rate Route Frequency Ordered Stop   09/03/14 1000   vancomycin (VANCOCIN) IVPB 1000 mg/200 mL premix  Status:  Discontinued     1,000 mg 200 mL/hr over 60 Minutes Intravenous 3 times per day 09/03/14 0859 09/05/14 0550   09/02/14 1835  vancomycin (VANCOCIN) powder  Status:  Discontinued       As needed 09/02/14 1836 09/02/14 1836   08/30/14 1600  cefTRIAXone (ROCEPHIN) 1 g in dextrose 5 % 50 mL IVPB     1 g 100 mL/hr over 30 Minutes Intravenous Every 24 hours 08/29/14 2025     08/30/14 1300  fluconazole (DIFLUCAN) tablet 100 mg  Status:  Discontinued     100 mg Oral Daily 08/30/14 1143 09/02/14 0829   08/29/14 2200  doxycycline (VIBRA-TABS) tablet 100 mg  Status:  Discontinued     100 mg Oral Every 12 hours 08/29/14 2025 09/03/14 1052   08/29/14 1530  cefTRIAXone (ROCEPHIN) 1 g in dextrose 5 % 50 mL IVPB     1 g 100 mL/hr over 30 Minutes Intravenous  Once 08/29/14 1523 08/29/14 1631      Assessment: Patient with high VT.    Goal of Therapy:  Vancomycin trough level 15-20 mcg/ml  Plan:  Change Vancomycin to 1g q12  Aleene Davidson Crowford 09/05/2014,6:41 AM

## 2014-09-05 NOTE — Progress Notes (Signed)
Patient ID: Monique [name], female   DOB: 07/07/1975, 39 y.o.   MRN: [id number] Dressing dry. Will need ABX as planned when discharged. Pt expressed desire to get out quickly from hospital . Follow-up with Dr. Lajoyce Cornersuda after discharge.

## 2014-09-06 ENCOUNTER — Encounter (HOSPITAL_COMMUNITY): Payer: Self-pay | Admitting: Orthopedic Surgery

## 2014-09-06 LAB — GLUCOSE, CAPILLARY: Glucose-Capillary: 138 mg/dL — ABNORMAL HIGH (ref 65–99)

## 2014-09-06 MED ORDER — SULFAMETHOXAZOLE-TRIMETHOPRIM 800-160 MG PO TABS: 1.0000 | ORAL_TABLET | Freq: Two times a day (BID) | ORAL | Status: DC

## 2014-09-06 MED ORDER — BUPROPION HCL 100 MG PO TABS: 100.0000 mg | ORAL_TABLET | Freq: Two times a day (BID) | ORAL | Status: DC

## 2014-09-06 MED ORDER — ACETAMINOPHEN 325 MG PO TABS: 650.0000 mg | ORAL_TABLET | Freq: Four times a day (QID) | ORAL | Status: DC | PRN

## 2014-09-06 MED ORDER — OXYCODONE-ACETAMINOPHEN 5-325 MG PO TABS: 1.0000 | ORAL_TABLET | ORAL | Status: DC | PRN

## 2014-09-06 MED ORDER — ALBUTEROL SULFATE HFA 108 (90 BASE) MCG/ACT IN AERS: 2.0000 | INHALATION_SPRAY | RESPIRATORY_TRACT | Status: DC | PRN

## 2014-09-06 MED ORDER — ALPRAZOLAM 0.25 MG PO TABS: 0.2500 mg | ORAL_TABLET | Freq: Every evening | ORAL | Status: DC | PRN

## 2014-09-06 NOTE — Discharge Instructions (Signed)
Stay off of your right foot and ankle as much as possible. Ice and elevation for swelling. Keep your dressing completely dry and clean.  Bone and Joint Infections Joint infections are called septic or infectious arthritis. An infected joint may damage cartilage and tissue very quickly. This may destroy the joint. Bone infections (osteomyelitis) may last for years. Joints may become stiff if left untreated. Bacteria are the most common cause. Other causes include viruses and fungi, but these are more rare. Bone and joint infections usually come from injury or infection elsewhere in your body; the germs are carried to your bones or joints through the bloodstream.  CAUSES   Blood-carried germs from an infection elsewhere in your body can eventually spread to a bone or joint. The germ staphylococcus is the most common cause of both osteomyelitis and septic arthritis.  An injury can introduce germs into your bones or joints. SYMPTOMS   Weight loss.  Tiredness.  Chills and fever.  Bone or joint pain at rest and with activity.  Tenderness when touching the area or bending the joint.  Refusal to bear weight on a leg or inability to use an arm due to pain.  Decreased range of motion in a joint.  Skin redness, warmth, and tenderness.  Open skin sores and drainage. RISK FACTORS Children, the elderly, and those with weak immune systems are at increased risk of bone and joint infections. It is more common in people with HIV infections and with people on chemotherapy. People are also at increased risk if they have surgery where metal implants are used to stabilize the bone. Plates, screws, or artificial joints provide a surface that bacteria can stick on. Such a growth of bacteria is called biofilm. The biofilm protects bacteria from antibiotics and bodily defenses. This allows germs to multiply. Other reasons for increased risks include:   Having previous surgery or injury of a bone or  joint.  Being on high-dose corticosteroids and immunosuppressive medications that weaken your body's resistance to germs.  Diabetes and long-standing diseases.  Use of intravenous street drugs.  Being on hemodialysis.  Having a history of urinary tract infections.  Removal of your spleen (splenectomy). This weakens your immunity.  Chronic viral infections such as HIV or AIDS.  Lack of sensation such as paraplegia, quadriplegia, or spina bifida. DIAGNOSIS   Increased numbers of white blood cells in your blood may indicate infection. Some times your caregivers are able to identify the infecting germs by testing your blood. Inflammatory markers present in your bloodstream such as an erythrocyte sedimentation rate (ESR or sed rate) or c-reactive protein (CRP) can be indicators of deep infection.  Bone scans and X-ray exams are necessary for diagnosing osteomyelitis. They may help your caregiver find the infected areas. Other studies may give more detailed information. They may help detect fluid collections around a joint, abnormal bone surfaces, or be useful in diagnosing septic arthritis. They can find soft tissue swelling and find excess fluid in an infected joint or the adjacent bone. These tests include:  Ultrasound.  CT (computerized tomography).  MRI (magnetic resonance imaging).  The best test for diagnosing a bone or joint infection is an aspiration or biopsy. Your caregiver will usually use a local anesthetic. He or she can then remove tissue from a bone injury or use a needle to take fluids from an infected joint. A local anesthetic medication numbs the area to be biopsied. Often biopsies are done in the operating room under general anesthesia. This  means you will be asleep during the procedure. Tests performed on these samples can identify an infection. TREATMENT   Treatment can help control long-standing infections, but infections may come back.  Infections can infect any  bone or joint at any age.  Bone and joint infections are rarely fatal.  Bone infection left untreated can become a never-ending infection. It can spread to other areas of your body. It may eventually cause bone death. Reduced limb or joint function can result. In severe cases, this may require removal of a limb. Spinal osteomyelitis is very dangerous. Untreated, it may damage spinal nerves and cause death.  The most common complication of septic arthritis is osteoarthritis with pain and decreased range of motion of the joint. Some forms of treatment may include:  If the infection is caused by bacteria, it is generally treated with antibiotics. You will likely receive the drugs through a vein (intravenously) for anywhere from 2 to 6 weeks. In some cases, especially with children, oral antibiotics following an initial intravenous dose may be effective. The treatment you receive depends on the:  Type of bacteria.  Location of the infection.  Type of surgery that might be done.  Other health conditions or issues you might have.  Your caregiver may drain soft tissue abscesses or pockets of fluid around infected bones or joints. If you have septic arthritis, your caregiver may use a needle to drain pus from the joint on a daily basis. He or she may use an arthroscope to clean the joint or may need to open the joint surgically to remove damaged tissue and infection. An arthroscope is an instrument like a thin lighted telescope. It can be used to look inside the joint.  Surgery is usually needed if the infection has become long-standing. It may also be needed if there is hardware (such as metal plates, screws, or artificial joints) inside the patient. Sometimes a bone or muscle graft is needed to fill in the open space. This promotes growth of new tissues and better blood flow to the area. PREVENTION   Clean and disinfect wounds quickly to help prevent the start of a bone or joint infection. Get  treatment for any infections to prevent spread to a bone or joint.  Do not smoke. Smoking decreases healing rates of bone and predisposes to infection.  When given medications that suppress your immune system, use them according to your caregiver's instructions. Do not take more than prescribed for your condition.  Take good care of your feet and skin, especially if you have diabetes, decreased sensation or circulation problems. SEEK IMMEDIATE MEDICAL CARE IF:   You cannot bear weight on a leg or use an arm, especially following a minor injury. This can be a sign of bone or joint infection.  You think you may have signs or symptoms of a bone or joint infection. Your chance of getting rid of an infection is better if treated early. Document Released: 02/12/2005 Document Revised: 05/07/2011 Document Reviewed: 01/12/2009 [facility] Patient Information 2015 [place], [place]. This information is not intended to replace advice given to you by your health care provider. Make sure you discuss any questions you have with your health care provider.

## 2014-09-06 NOTE — Progress Notes (Signed)
Telephone call to home, message left with family member that pt's phone charger was left in the room and will be in the pt belongings cabinet in conference room on 4th floor.

## 2014-09-06 NOTE — Discharge Summary (Signed)
Physician Discharge Summary  Monique [name] [name]:[id number] DOB: 06/24/1975 DOA: 08/29/2014  PCP: Default, Provider, MD - patient will follow in the [facility]  Admit date: 08/29/2014 Discharge date: 09/06/2014  Recommendations for Outpatient Follow-up:  1. Check I spoke with Dr. Ninetta LightsHatcher of infectious disease who recommended Bactrim for 12 weeks on discharge. Please note that this is a better option for this patient considering her history of drug abuse. She is at high risk of drug abuse if we were to continue IV antibodies and if she were to have PICC line placed. I agree with Dr. Ninetta LightsHatcher Bactrim is better option for her.  Discharge Diagnoses:  Principal Problem:   Acute encephalopathy Active Problems:   Drug abuse   Cocaine abuse   Acute respiratory failure with hypoxia   Osteomyelitis    Discharge Condition: stable   Diet recommendation: as tolerated   History of present illness:  39 y.o. Female with past medical history of drug abuse, prostitution who [facility] with altered mental status. She was found unresponsive while eating and when EMS arrived they gave her a narcan but with no significant changes in mental status.   In ED, she was found to have T of 99.5 F, HR 96, RR 25, BP 81/45, oxygen saturation of 89% on room air but this has improved with [place] oxygen support. UA showed small leukocytes and many bacteria and she was started on empiric rocephin for possible UTI. Alcohol level was WNL and UDS was positive for cocaine, benzos and THC. CT head did not show acute intracranial findings. Due to her initial presentation of unresponsiveness she was admitted to stepdown unit. Patient was transferred to telemetry floor 08/30/2014.  Patient was transferred out of the stepdown unit the 08/30/2014. Her mental status is at baseline. Hospital course is complicated with finding of septic arthritis and osteomyelitis in the right tibiotalar joint. Orthopedic  surgery has seen the patient in consultation. Patient underwent surgery for removal of old hardware done by Dr. Aldean BakerMarcus Duda or orthopedic surgery on 09/02/2014.   Hospital Course:   Assessment/Plan:    Principal Problem:  Acute encephalopathy / Drug overdose / Drug abuse - UDS positive for cocaine, benzos and THC on admission. - Mental status at baseline  - HIV negative. GC chlamydia is negative. RPR is nonreactive. Acute hepatitis panel is negative. - Counseled on drug abuse.   Active Problems:  Acute respiratory failure with hypoxia - Likely from drug overdose - Stable    UTI - UA on admission showed leukocytes and many bacteria. Preliminary urine culture result is positive for staph aureus species.  - Please note that patient will continue Bactrim on discharge for septic arthritis/osteomyelitis. This would have covered for staph aureus urinary tract infection. -Since GC chlamydia is negative doxycycline stopped 09/03/2014  - Fluconazole started for yeast infection. Received it for 4 days. Stopped 09/02/2014.   RLE cellulitis / septic arthritis / chronic osteomyelitis of the tibiotalar joint  - X-ray of the right ankle on the admission did not reveal acute findings. X-ray was repeated the following day and demonstrated possibly acute infection. - MRI of the right ankle 08/30/2014 demonstrate a septic arthritis and osteomyelitis of the tibiotalar joint. - S/P removal of all hardware 09/02/2014  - On vanco and rocephin per infectious disease recommendations from the time of the surgery. On discharge she will continue Bactrim for 12 weeks. As mentioned above, IV anti-biotic sent placement of PICC line is not a good  choice for this patient because of her history of drug abuse. - Wound culture results - so far no growth    Tobacco abuse - Continue nicotine patch and Wellbutrin 100 mg twice daily   DVT prophylaxis:  - SCD's bilaterally during hospital stay   Code Status:  Full.  Family Communication: plan of care discussed with the patient   IV access:  Peripheral IV  Procedures and diagnostic studies:   Monique [name] 08/31/2014 Monique findings most consistent with septic arthritis and osteomyelitis involving the tibiotalar joint. Recommend joint aspiration and orthopedic consultation.   Dg Chest 1 View 08/29/2014 Low lung volumes without focal disease.   Dg Ankle 2 Views Right 08/30/2014 1. Lucency around fibular fixation hardware suggesting loosening or infection.   Dg Ankle Complete Right 08/29/2014 No acute osseous abnormality. Old bimalleolar ORIF with loose hardware.   Ct Head Wo [name] 08/29/2014 No focal acute intracranial abnormality identified. Electronically Signed By: Sherian Rein M.D. On: 08/29/2014 14:03    Medical Consultants:  Orthopedic surgery, Dr. Aldean Baker  ID, Dr. Johny Sax   Other Consultants:  None   IAnti-Infectives:   Rocephin 08/29/2014 --> 09/06/2014 Vancomycin 09/03/2014 --> 09/06/2014  Doxycycline 08/29/2014 --> 09/03/2014  Fluconazole 08/30/2014 --> 09/02/2014       Signed:  Manson Passey, MD  [facility] 09/06/2014, 10:03 AM  Pager #: [phone]  Time spent in minutes: more than 30 minutes   Discharge Exam: Filed Vitals:   09/06/14 0630  BP: 127/64  Pulse: 76  Temp: 97.9 F (36.6 C)  Resp: 18   Filed Vitals:   09/05/14 0539 09/05/14 1416 09/05/14 2221 09/06/14 0630  BP: 112/56 116/64 111/63 127/64  Pulse: 75 83 83 76  Temp: 98.6 F (37 C) 98.3 F (36.8 C) 97.7 F (36.5 C) 97.9 F (36.6 C)  TempSrc: Oral Oral Oral Oral  Resp: 20 18 18 18   Height:      Weight:      SpO2: 100% 97% 95% 100%    General: Pt is alert, follows commands appropriately, not in acute distress Cardiovascular: Regular rate and rhythm, S1/S2 +, no murmurs Respiratory: Clear to auscultation bilaterally, no wheezing, no crackles, no rhonchi Abdominal: Soft, non tender,  non distended, bowel sounds +, no guarding Extremities: RLE dressing in place, no cyanosis, pulses palpable bilaterally DP and PT Neuro: Grossly nonfocal  Discharge Instructions  Discharge Instructions    Call MD for:  difficulty breathing, headache or visual disturbances    Complete by:  As directed      Call MD for:  persistant nausea and vomiting    Complete by:  As directed      Call MD for:  severe uncontrolled pain    Complete by:  As directed      Diet - low sodium heart healthy    Complete by:  As directed      Discharge instructions    Complete by:  As directed   1. Per infectious disease recommendations continue Bactrim double strength tablet twice daily for 12 weeks on discharge. Follow-up in infectious disease in about 5-6 weeks after this discharge.     Increase activity slowly    Complete by:  As directed             Medication List    STOP taking these medications        HYDROcodone-acetaminophen 10-325 MG per tablet  Commonly known as:  NORCO     ibuprofen 200 MG  tablet  Commonly known as:  ADVIL,MOTRIN      TAKE these medications        acetaminophen 325 MG tablet  Commonly known as:  TYLENOL  Take 2 tablets (650 mg total) by mouth every 6 (six) hours as needed for mild pain (or Fever >/= 101).     albuterol 108 (90 BASE) MCG/ACT inhaler  Commonly known as:  PROVENTIL HFA;VENTOLIN HFA  Inhale 2 puffs into the lungs every 4 (four) hours as needed for wheezing or shortness of breath.     ALPRAZolam 0.25 MG tablet  Commonly known as:  XANAX  Take 1 tablet (0.25 mg total) by mouth at bedtime as needed for anxiety.     buPROPion 100 MG tablet  Commonly known as:  WELLBUTRIN  Take 1 tablet (100 mg total) by mouth 2 (two) times daily.     oxyCODONE-acetaminophen 5-325 MG per tablet  Commonly known as:  PERCOCET/ROXICET  Take 1 tablet by mouth every 4 (four) hours as needed for moderate pain.     sulfamethoxazole-trimethoprim 800-160 MG per tablet   Commonly known as:  BACTRIM DS,SEPTRA DS  Take 1 tablet by mouth 2 (two) times daily.           Follow-up Information    Follow up with DUDA,MARCUS V, MD. Schedule an appointment as soon as possible for a visit in 2 weeks.   Specialty:  Orthopedic Surgery   Contact information:   [address] [place] [address] [phone]       Follow up with Johny Sax, MD In 5 weeks.   Specialty:  Infectious Diseases   Why:  Follow up appt after recent hospitalization for septic arthritis    Contact information:   [address] [place] [address] [phone]        The results of significant diagnostics from this hospitalization (including imaging, microbiology, ancillary and laboratory) are listed below for reference.    Significant Diagnostic Studies: Dg Chest 1 View  08/29/2014   CLINICAL DATA:  Minimally responsive.  Altered mental status.  EXAM: CHEST  1 VIEW  COMPARISON:  06/02/2012  FINDINGS: Low lung volumes without focal chest disease. Heart and mediastinum are within normal limits. The trachea is midline. No acute bone abnormality. Negative for pneumothorax.  IMPRESSION: Low lung volumes without focal disease.   Electronically Signed   By: Richarda Overlie M.D.   On: 08/29/2014 14:02   Dg Ankle 2 Views Right  08/30/2014   CLINICAL DATA:  Pain, swelling, redness.  Lateral wound.  EXAM: RIGHT ANKLE - 2 VIEW  COMPARISON:  08/29/2014  FINDINGS: Plate and screw fixation of the distal fibular shaft, with 2 screws extending across into the distal tibial metaphysis. There is lucency around multiple screws. The inferior most screw has backed out at least 6 mm, as seen on previous exam. Paired screws are noted across the medial malleolus as well, intact without surrounding lucency. There is mild widening of the ankle mortise medially. No fracture or dislocation. Normal osseous mineralization elsewhere.  IMPRESSION: 1. Lucency around fibular fixation hardware suggesting  loosening or infection.   Electronically Signed   By: Corlis Leak M.D.   On: 08/30/2014 09:42   Dg Ankle Complete Right  08/29/2014   CLINICAL DATA:  Fall today. Altered mental status. Patient minimally responsive.  EXAM: RIGHT ANKLE - COMPLETE 3+ VIEW  COMPARISON:  None.  FINDINGS: ORIF of the RIGHT ankle is present. This is a bimalleolar  fixation with lateral fibular plate and screws and syndesmotic screws. There is osteolysis around the syndesmotic screws. aThe most inferior lateral malleolar screw has backed out and the head is in the subcutaneous tissues. Smooth subperiosteal new bone is evident in the anterior tibial plafond on the lateral projection.  There is no acute ankle fracture. The ankle mortise is congruent. Likely old avulsion fracture from the tip of the medial malleolus.  IMPRESSION: No acute osseous abnormality. Old bimalleolar ORIF with loose hardware.   Electronically Signed   By: Andreas Newport M.D.   On: 08/29/2014 14:04   Ct Head Wo [name]  08/29/2014   CLINICAL DATA:  Patient was found unconscious at a parking lot at a convenient store.  EXAM: CT HEAD WITHOUT [name]  TECHNIQUE: Contiguous axial images were obtained from the base of the skull through the vertex without intravenous [name].  COMPARISON:  May 25, 2012  FINDINGS: There is no midline shift, hydrocephalus, or mass. No acute hemorrhage or acute transcortical infarct is identified. Bony calvarium is intact. The visualized sinuses are clear.  IMPRESSION: No focal acute intracranial abnormality identified.   Electronically Signed   By: Sherian Rein M.D.   On: 08/29/2014 14:03   Monique Ankle Right  Wo [name]  08/31/2014   CLINICAL DATA:  Ankle Pain and swelling for the past month. History of prior fracture fixation with hardware.  EXAM: MRI OF THE RIGHT ANKLE WITHOUT [name]  TECHNIQUE: Multiplanar, multisequence Monique imaging of the ankle was performed. No intravenous [name] was administered.  COMPARISON:  Radiograph  08/30/2014 and 08/29/2014  FINDINGS: Moderate artifact associated with the tibial and fibular hardware. There is tibiotalar joint space narrowing, large joint effusion and severe synovitis. There is also marrow edema in the tibia and talus. Findings are most consistent with septic arthritis and osteomyelitis. Un advanced inflammatory arthropathy is also possible but unlikely.  The major tendons and ligaments appear intact. Small subtalar joint effusion involving the posterior facet and mild edema in the sinus tarsi.  IMPRESSION:  Monique findings most consistent with septic arthritis and osteomyelitis involving the tibiotalar joint. Recommend joint aspiration and orthopedic consultation.  Findings were called to the patients nurse [name] at 7:30 p.m. the date of the study. I also discussed this case directly with the nurse practitioner that was covering for the hospitalists at [facility].   Electronically Signed   By: Rudie Meyer M.D.   On: 08/31/2014 08:38    Microbiology: Recent Results (from the past 240 hour(s))  Urine culture     Status: None   Collection Time: 08/29/14  2:50 PM  Result Value Ref Range Status   Specimen Description URINE, CATHETERIZED  Final   Special Requests NONE  Final   Culture   Final    >=100,000 COLONIES/mL STAPHYLOCOCCUS AUREUS Performed at [facility]    Report Status 09/02/2014 FINAL  Final   Organism ID, Bacteria STAPHYLOCOCCUS AUREUS  Final      Susceptibility   Staphylococcus aureus - MIC*    CIPROFLOXACIN <=0.5 SENSITIVE Sensitive     GENTAMICIN <=0.5 SENSITIVE Sensitive     NITROFURANTOIN <=16 SENSITIVE Sensitive     OXACILLIN <=0.25 SENSITIVE Sensitive     TETRACYCLINE <=1 SENSITIVE Sensitive     VANCOMYCIN <=0.5 SENSITIVE Sensitive     TRIMETH/SULFA <=10 SENSITIVE Sensitive     CLINDAMYCIN <=0.25 SENSITIVE Sensitive     RIFAMPIN <=0.5 SENSITIVE Sensitive     Inducible Clindamycin NEGATIVE Sensitive     * >=  100,000 COLONIES/mL STAPHYLOCOCCUS  AUREUS  MRSA PCR Screening     Status: None   Collection Time: 08/29/14  5:15 PM  Result Value Ref Range Status   MRSA by PCR NEGATIVE NEGATIVE Final  Surgical pcr screen     Status: None   Collection Time: 09/01/14  7:00 AM  Result Value Ref Range Status   MRSA, PCR NEGATIVE NEGATIVE Final   Staphylococcus aureus NEGATIVE NEGATIVE Final  Anaerobic culture     Status: None (Preliminary result)   Collection Time: 09/02/14  6:00 PM  Result Value Ref Range Status   Specimen Description ANKLE RIGHT  Final   Special Requests PATIENT ON FOLLOWING ROCEFIN  Final   Gram Stain   Final    NO WBC SEEN NO SQUAMOUS EPITHELIAL CELLS SEEN NO ORGANISMS SEEN Performed at [facility]    Culture   Final    NO ANAEROBES ISOLATED; CULTURE IN PROGRESS FOR 5 DAYS Performed at [facility]    Report Status PENDING  Incomplete  Wound culture     Status: None   Collection Time: 09/02/14  6:00 PM  Result Value Ref Range Status   Specimen Description ANKLE RIGHT  Final   Special Requests PATIENT ON FOLLOWING ROCEFIN  Final   Gram Stain   Final    NO WBC SEEN NO SQUAMOUS EPITHELIAL CELLS SEEN NO ORGANISMS SEEN Performed at [facility]    Culture   Final    NO GROWTH 2 DAYS Performed at [facility]    Report Status 09/05/2014 FINAL  Final     Labs: Basic Metabolic Panel:  Recent Labs Lab 08/31/14 1820 09/04/14 1233  NA 136 133*  K 4.0 3.8  CL 103 98*  CO2 26 27  GLUCOSE 114* 109*  BUN 12 14  CREATININE 0.80 0.81  CALCIUM 8.8* 9.3   Liver Function Tests:  Recent Labs Lab 08/31/14 1820  AST 15  ALT 18  ALKPHOS 50  BILITOT 0.2*  PROT 7.1  ALBUMIN 3.4*   No results for input(s): LIPASE, AMYLASE in the last 168 hours. No results for input(s): AMMONIA in the last 168 hours. CBC:  Recent Labs Lab 08/31/14 1820 09/04/14 1233  WBC 6.4 6.9  NEUTROABS 3.2  --   HGB 10.3* 10.3*  HCT 33.8* 33.2*  MCV 84.3 82.4  PLT 372 340   Cardiac  Enzymes: No results for input(s): CKTOTAL, CKMB, CKMBINDEX, TROPONINI in the last 168 hours. BNP: BNP (last 3 results) No results for input(s): BNP in the last 8760 hours.  ProBNP (last 3 results) No results for input(s): PROBNP in the last 8760 hours.  CBG:  Recent Labs Lab 09/02/14 0732 09/03/14 0759 09/04/14 0738 09/05/14 0733 09/06/14 0738  GLUCAP 97 99 116* 100* 138*

## 2014-09-08 ENCOUNTER — Inpatient Hospital Stay: Payer: Self-pay | Admitting: Internal Medicine

## 2014-09-08 LAB — ANAEROBIC CULTURE: Gram Stain: NONE SEEN

## 2014-09-21 ENCOUNTER — Inpatient Hospital Stay: Payer: Self-pay | Admitting: Internal Medicine

## 2014-09-28 ENCOUNTER — Inpatient Hospital Stay (HOSPITAL_COMMUNITY)
Admission: EM | Admit: 2014-09-28 | Discharge: 2014-10-04 | DRG: 540 | Disposition: A | Discharge status: Skilled Nursing Facility | Payer: Medicaid Other | Attending: Internal Medicine | Admitting: Internal Medicine

## 2014-09-28 ENCOUNTER — Encounter (HOSPITAL_COMMUNITY): Payer: Self-pay | Admitting: Emergency Medicine

## 2014-09-28 DIAGNOSIS — K0510 Chronic gingivitis, plaque induced: Secondary | ICD-10-CM | POA: Diagnosis present

## 2014-09-28 DIAGNOSIS — T814XXA Infection following a procedure, initial encounter: Secondary | ICD-10-CM

## 2014-09-28 DIAGNOSIS — IMO0001 Reserved for inherently not codable concepts without codable children: Secondary | ICD-10-CM

## 2014-09-28 DIAGNOSIS — Z79899 Other long term (current) drug therapy: Secondary | ICD-10-CM

## 2014-09-28 DIAGNOSIS — E876 Hypokalemia: Secondary | ICD-10-CM | POA: Diagnosis not present

## 2014-09-28 DIAGNOSIS — M869 Osteomyelitis, unspecified: Secondary | ICD-10-CM | POA: Diagnosis present

## 2014-09-28 DIAGNOSIS — F419 Anxiety disorder, unspecified: Secondary | ICD-10-CM | POA: Diagnosis present

## 2014-09-28 DIAGNOSIS — H109 Unspecified conjunctivitis: Secondary | ICD-10-CM | POA: Diagnosis present

## 2014-09-28 DIAGNOSIS — F1910 Other psychoactive substance abuse, uncomplicated: Secondary | ICD-10-CM | POA: Diagnosis present

## 2014-09-28 DIAGNOSIS — F17210 Nicotine dependence, cigarettes, uncomplicated: Secondary | ICD-10-CM | POA: Diagnosis present

## 2014-09-28 DIAGNOSIS — M868X7 Other osteomyelitis, ankle and foot: Principal | ICD-10-CM | POA: Diagnosis present

## 2014-09-28 DIAGNOSIS — Z9119 Patient's noncompliance with other medical treatment and regimen: Secondary | ICD-10-CM | POA: Diagnosis present

## 2014-09-28 DIAGNOSIS — F319 Bipolar disorder, unspecified: Secondary | ICD-10-CM | POA: Diagnosis present

## 2014-09-28 DIAGNOSIS — F1410 Cocaine abuse, uncomplicated: Secondary | ICD-10-CM | POA: Diagnosis present

## 2014-09-28 DIAGNOSIS — F329 Major depressive disorder, single episode, unspecified: Secondary | ICD-10-CM | POA: Diagnosis present

## 2014-09-28 DIAGNOSIS — H5789 Other specified disorders of eye and adnexa: Secondary | ICD-10-CM | POA: Diagnosis present

## 2014-09-28 DIAGNOSIS — M009 Pyogenic arthritis, unspecified: Secondary | ICD-10-CM | POA: Diagnosis present

## 2014-09-28 DIAGNOSIS — Z8614 Personal history of Methicillin resistant Staphylococcus aureus infection: Secondary | ICD-10-CM

## 2014-09-28 DIAGNOSIS — T8149XA Infection following a procedure, other surgical site, initial encounter: Secondary | ICD-10-CM | POA: Diagnosis present

## 2014-09-28 DIAGNOSIS — F1210 Cannabis abuse, uncomplicated: Secondary | ICD-10-CM | POA: Diagnosis present

## 2014-09-28 LAB — COMPREHENSIVE METABOLIC PANEL
ALBUMIN: 3.3 g/dL — AB (ref 3.5–5.0)
ALK PHOS: 53 U/L (ref 38–126)
ALT: 15 U/L (ref 14–54)
AST: 21 U/L (ref 15–41)
Anion gap: 8 (ref 5–15)
BILIRUBIN TOTAL: 0.1 mg/dL — AB (ref 0.3–1.2)
BUN: 10 mg/dL (ref 6–20)
CALCIUM: 8.8 mg/dL — AB (ref 8.9–10.3)
CO2: 22 mmol/L (ref 22–32)
CREATININE: 0.68 mg/dL (ref 0.44–1.00)
Chloride: 110 mmol/L (ref 101–111)
GFR calc Af Amer: >60 mL/min (ref >60)
Glucose, Bld: 132 mg/dL — ABNORMAL HIGH (ref 65–99)
Potassium: 3.7 mmol/L (ref 3.5–5.1)
Sodium: 140 mmol/L (ref 135–145)
TOTAL PROTEIN: 6.6 g/dL (ref 6.5–8.1)

## 2014-09-28 LAB — CBC WITH DIFFERENTIAL/PLATELET
BASOS ABS: 0 10*3/uL (ref 0.0–0.1)
Basophils Relative: 0 % (ref 0–1)
EOS ABS: 0.1 10*3/uL (ref 0.0–0.7)
Eosinophils Relative: 2 % (ref 0–5)
HCT: 33.3 % — ABNORMAL LOW (ref 36.0–46.0)
Hemoglobin: 9.8 g/dL — ABNORMAL LOW (ref 12.0–15.0)
Lymphocytes Relative: 47 % — ABNORMAL HIGH (ref 12–46)
Lymphs Abs: 2.4 10*3/uL (ref 0.7–4.0)
MCH: 24.4 pg — ABNORMAL LOW (ref 26.0–34.0)
MCHC: 29.4 g/dL — ABNORMAL LOW (ref 30.0–36.0)
MCV: 82.8 fL (ref 78.0–100.0)
MONO ABS: 0.3 10*3/uL (ref 0.1–1.0)
Monocytes Relative: 6 % (ref 3–12)
Neutro Abs: 2.3 10*3/uL (ref 1.7–7.7)
Neutrophils Relative %: 45 % (ref 43–77)
PLATELETS: 287 10*3/uL (ref 150–400)
RBC: 4.02 MIL/uL (ref 3.87–5.11)
RDW: 16.5 % — ABNORMAL HIGH (ref 11.5–15.5)
WBC: 5.2 10*3/uL (ref 4.0–10.5)

## 2014-09-28 MED ORDER — HYDROMORPHONE HCL 1 MG/ML IJ SOLN
1.0000 mg | Freq: Once | INTRAMUSCULAR | Status: AC
  Administered 2014-09-29: 1 mg via INTRAVENOUS
  Filled 2014-09-28: qty 1

## 2014-09-28 MED ORDER — CIPROFLOXACIN HCL 0.3 % OP SOLN
2.0000 [drp] | OPHTHALMIC | Status: DC
  Administered 2014-09-29: 2 [drp] via OPHTHALMIC
  Filled 2014-09-28 (×2): qty 2.5

## 2014-09-28 MED ORDER — ONDANSETRON HCL 4 MG/2ML IJ SOLN
4.0000 mg | Freq: Once | INTRAMUSCULAR | Status: AC
  Administered 2014-09-29: 4 mg via INTRAVENOUS
  Filled 2014-09-28: qty 2

## 2014-09-28 NOTE — ED Notes (Signed)
The patient had surgery about a year ago.  Three weeks ago, her foot was not healing well, her foot was getting infected so they took her plates and screws out of her right foot.   The patient says she has been having fever, chills and her foot is draining.  She rates her pain 10/10.

## 2014-09-28 NOTE — ED Provider Notes (Signed)
CSN: [id number]     Arrival date & time 09/28/14  2018 History   This chart was scribed for Monique Sprout, MD by Arlan Organ, ED Scribe. This patient was seen in room B16C/B16C and the patient's care was started 11:28 PM.   Chief Complaint  Patient presents with  . Wound Infection    The patient had surgery about a year ago.  Three weeks ago, her foot was not healing well, her foot was getting infected so they took her plates and screws out of her right foot.    The history is provided by the patient. No language interpreter was used.   HPI Comments: Monique [name] is a 39 y.o. female who presents to the Emergency Department here for a wound infection this evening. Pt underwent surgery 1 year ago along with a hardware removal performed on 09/02/14 to the medial aspect of the R ankle. Pt was prescribed Bactrim at time of discharge, however, she has not taken this medication as prescribed. She now reports constant, ongoing pain, swelling, continued drainage, and redness to open wound along with hot and cold chills. Pain is currently rated 10/10. OTC remedies attempted such as soaking the ankle in Epson salt without any improvement for symptoms. Also OTC Tylenol attempted with no relief. She denies any recent fever. Last use of marijuana 2-3 weeks ago. Denies any illicit drug use. No known allergies to medications.  Ms. Buonocore reports constant, ongoing redness, watering, and discomfort to the L eye x 2-3 days. She denies any recent injury or foreign body entering the eye. No current history of contact use. No OTC medications or home remedies attempted prior to arrival.  Past Medical History  Diagnosis Date  . MRSA infection   . Bipolar disorder    Past Surgical History  Procedure Laterality Date  . Tubal ligation    . Hardware removal Right 09/02/2014    Procedure: Removal Right Lateral Ankle Hardware, Place Antibiotic Bead ;  Surgeon: [name], MD;  Location: [facility];  Service:  Orthopedics;  Laterality: Right;   History reviewed. No pertinent family history. History  Substance Use Topics  . Smoking status: Current Every Day Smoker -- 1.00 packs/day    Types: Cigarettes  . Smokeless tobacco: Never Used  . Alcohol Use: 1.2 oz/week    2 Cans of beer per week     Comment: in treatment    OB History    No data available     Review of Systems  Constitutional: Positive for chills. Negative for fever.  Eyes: Positive for pain. Negative for redness.  Respiratory: Negative for shortness of breath.   Cardiovascular: Negative for chest pain.  Gastrointestinal: Negative for nausea and vomiting.  Musculoskeletal: Positive for joint swelling.  Skin: Positive for wound.  All other systems reviewed and are negative.     Allergies  Review of patient's allergies indicates no known allergies.  Home Medications   Prior to Admission medications   Medication Sig Start Date End Date Taking? Authorizing Provider  acetaminophen (TYLENOL) 325 MG tablet Take 2 tablets (650 mg total) by mouth every 6 (six) hours as needed for mild pain (or Fever >/= 101). 09/06/14   Alison Murray, MD  albuterol (PROVENTIL HFA;VENTOLIN HFA) 108 (90 BASE) MCG/ACT inhaler Inhale 2 puffs into the lungs every 4 (four) hours as needed for wheezing or shortness of breath. 09/06/14   Alison Murray, MD  ALPRAZolam Prudy Feeler) 0.25 MG tablet Take 1 tablet (0.25 mg  total) by mouth at bedtime as needed for anxiety. 09/06/14   Alison Murray, MD  buPROPion (WELLBUTRIN) 100 MG tablet Take 1 tablet (100 mg total) by mouth 2 (two) times daily. 09/06/14   Alison Murray, MD  oxyCODONE-acetaminophen (PERCOCET/ROXICET) 5-325 MG per tablet Take 1 tablet by mouth every 4 (four) hours as needed for moderate pain. 09/06/14   Alison Murray, MD  sulfamethoxazole-trimethoprim (BACTRIM DS,SEPTRA DS) 800-160 MG per tablet Take 1 tablet by mouth 2 (two) times daily. 09/06/14   Alison Murray, MD   Triage Vitals: BP 123/72 mmHg   Pulse 68  Temp(Src) 97.7 F (36.5 C) (Oral)  Resp 20  Ht 5\' 4"  (1.626 m)  Wt 180 lb (81.647 kg)  BMI 30.88 kg/m2  SpO2 100%  LMP  (LMP Unknown)   Physical Exam  Constitutional: She is oriented to person, place, and time. She appears well-developed and well-nourished. No distress.  HENT:  Head: Normocephalic and atraumatic.  Eyes: EOM are normal. Right eye exhibits discharge. Right eye exhibits no exudate. No foreign body present in the right eye. Right conjunctiva is injected.  Neck: Normal range of motion.  Cardiovascular: Normal rate, regular rhythm and normal heart sounds.   Pulses:      Dorsalis pedis pulses are 2+ on the left side.  Pulmonary/Chest: Effort normal and breath sounds normal.  Abdominal: Soft. She exhibits no distension. There is no tenderness.  Musculoskeletal: Normal range of motion.  Neurological: She is alert and oriented to person, place, and time.  Skin: Skin is warm and dry.  R ankle with 3 surgical scars all are open with surrounding erythema Medial scar has tracking deeper wound only few stitches still present Swelling but not spreading erythema Tenderness to palpation  Less than 3 second capillary refill of foot  Psychiatric: She has a normal mood and affect. Judgment normal.  Nursing note and vitals reviewed.   ED Course  Procedures (including critical care time)  DIAGNOSTIC STUDIES: Oxygen Saturation is 100% on RA, Normal by my interpretation.    COORDINATION OF CARE: 11:30 PM- Will order CMP, CBC, DG Ankle Complete R.  Will give Ciloxan, Dilaudid, and Zofran. Discussed treatment plan with pt at bedside and pt agreed to plan.     Labs Review Labs Reviewed  COMPREHENSIVE METABOLIC PANEL - Abnormal; Notable for the following:    Glucose, Bld 132 (*)    Calcium 8.8 (*)    Albumin 3.3 (*)    Total Bilirubin 0.1 (*)    All other components within normal limits  CBC WITH DIFFERENTIAL/PLATELET - Abnormal; Notable for the following:     Hemoglobin 9.8 (*)    HCT 33.3 (*)    MCH 24.4 (*)    MCHC 29.4 (*)    RDW 16.5 (*)    Lymphocytes Relative 47 (*)    All other components within normal limits    Imaging Review Dg Ankle Complete Right  09/29/2014   CLINICAL DATA:  Pain and swelling.  Recent surgery.  EXAM: RIGHT ANKLE - COMPLETE 3+ VIEW  COMPARISON:  MRI 08/30/2014, radiographs 08/30/2014  FINDINGS: The distal fibular plate screw hardware has been removed. The inter osseous screws and medial malleolar screws have also been removed. A single screw remains at the lateral aspect of the distal fibula. The screw tracts remain clearly visible. There is no acute fracture. There is mild ossification of the interosseous ligament. No frank bony destruction. Mortise is slightly widened laterally. Talar dome appears intact.  IMPRESSION: Hardware removal. No frank bony destruction. No fracture or dislocation.   Electronically Signed   By: Ellery Plunk M.D.   On: 09/29/2014 00:24     EKG Interpretation None      MDM   Final diagnoses:  Wound infection after surgery, initial encounter  Conjunctivitis, left eye    Patient with a extensive medical history of right septic arthritis with recent hardware removal at the beginning of July who is currently getting vancomycin and Rocephin in the hospital however because of history of drug abuse was not sent home with a PICC line due to concern for recreational use. Infectious disease recommended patient be discharged home with Bactrim for 12 weeks. Patient has not filled that prescription and in the last 3 days since noticed worsening of the ankle wounds. With significant swelling and drainage. On exam the surgical wounds are open but the medial ankle wound has a hole with some tunneling and concern for deeper wound. Neurovascularly intact. No significant spreading edema or signs of cellulitis. Concern for new recurrent infection. Patient will need wound care and antibiotics. She was admitted  for further care.  Dr. Youlanda Roys was going to decide on and start the antibiotics.  Also patient has evidence of conjunctivitis in the left eye with constant running and drainage. No visual changes and no foreign bodies.  Started on Cipro drops  I personally performed the services described in this documentation, which was scribed in my presence.  The recorded information has been reviewed and considered.   Monique Sprout, MD 09/29/14 0157

## 2014-09-29 ENCOUNTER — Emergency Department (HOSPITAL_COMMUNITY): Payer: Medicaid Other

## 2014-09-29 ENCOUNTER — Encounter (HOSPITAL_COMMUNITY): Payer: Self-pay | Admitting: Internal Medicine

## 2014-09-29 DIAGNOSIS — M009 Pyogenic arthritis, unspecified: Secondary | ICD-10-CM | POA: Diagnosis present

## 2014-09-29 DIAGNOSIS — Z79899 Other long term (current) drug therapy: Secondary | ICD-10-CM | POA: Diagnosis not present

## 2014-09-29 DIAGNOSIS — T814XXD Infection following a procedure, subsequent encounter: Secondary | ICD-10-CM

## 2014-09-29 DIAGNOSIS — F419 Anxiety disorder, unspecified: Secondary | ICD-10-CM | POA: Diagnosis present

## 2014-09-29 DIAGNOSIS — M869 Osteomyelitis, unspecified: Secondary | ICD-10-CM | POA: Insufficient documentation

## 2014-09-29 DIAGNOSIS — Z8614 Personal history of Methicillin resistant Staphylococcus aureus infection: Secondary | ICD-10-CM | POA: Diagnosis not present

## 2014-09-29 DIAGNOSIS — F319 Bipolar disorder, unspecified: Secondary | ICD-10-CM | POA: Diagnosis present

## 2014-09-29 DIAGNOSIS — Z9119 Patient's noncompliance with other medical treatment and regimen: Secondary | ICD-10-CM | POA: Diagnosis present

## 2014-09-29 DIAGNOSIS — F1910 Other psychoactive substance abuse, uncomplicated: Secondary | ICD-10-CM

## 2014-09-29 DIAGNOSIS — F1210 Cannabis abuse, uncomplicated: Secondary | ICD-10-CM | POA: Diagnosis present

## 2014-09-29 DIAGNOSIS — H5789 Other specified disorders of eye and adnexa: Secondary | ICD-10-CM | POA: Diagnosis present

## 2014-09-29 DIAGNOSIS — F17210 Nicotine dependence, cigarettes, uncomplicated: Secondary | ICD-10-CM | POA: Diagnosis present

## 2014-09-29 DIAGNOSIS — E876 Hypokalemia: Secondary | ICD-10-CM | POA: Diagnosis not present

## 2014-09-29 DIAGNOSIS — H578 Other specified disorders of eye and adnexa: Secondary | ICD-10-CM

## 2014-09-29 DIAGNOSIS — M868X7 Other osteomyelitis, ankle and foot: Secondary | ICD-10-CM | POA: Diagnosis present

## 2014-09-29 DIAGNOSIS — F1410 Cocaine abuse, uncomplicated: Secondary | ICD-10-CM | POA: Diagnosis present

## 2014-09-29 DIAGNOSIS — T8149XA Infection following a procedure, other surgical site, initial encounter: Secondary | ICD-10-CM | POA: Diagnosis present

## 2014-09-29 DIAGNOSIS — K0510 Chronic gingivitis, plaque induced: Secondary | ICD-10-CM | POA: Diagnosis present

## 2014-09-29 DIAGNOSIS — M25572 Pain in left ankle and joints of left foot: Secondary | ICD-10-CM | POA: Diagnosis present

## 2014-09-29 DIAGNOSIS — F329 Major depressive disorder, single episode, unspecified: Secondary | ICD-10-CM | POA: Diagnosis present

## 2014-09-29 DIAGNOSIS — H109 Unspecified conjunctivitis: Secondary | ICD-10-CM | POA: Diagnosis present

## 2014-09-29 LAB — BASIC METABOLIC PANEL
Anion gap: 7 (ref 5–15)
BUN: 10 mg/dL (ref 6–20)
CALCIUM: 8.5 mg/dL — AB (ref 8.9–10.3)
CHLORIDE: 109 mmol/L (ref 101–111)
CO2: 24 mmol/L (ref 22–32)
CREATININE: 0.66 mg/dL (ref 0.44–1.00)
GLUCOSE: 128 mg/dL — AB (ref 65–99)
POTASSIUM: 3.3 mmol/L — AB (ref 3.5–5.1)
Sodium: 140 mmol/L (ref 135–145)

## 2014-09-29 LAB — LACTIC ACID, PLASMA
LACTIC ACID, VENOUS: 1.3 mmol/L (ref 0.5–2.0)
Lactic Acid, Venous: 0.9 mmol/L (ref 0.5–2.0)

## 2014-09-29 LAB — APTT: aPTT: 28 seconds (ref 24–37)

## 2014-09-29 LAB — CBC
HEMATOCRIT: 30.6 % — AB (ref 36.0–46.0)
HEMOGLOBIN: 9.1 g/dL — AB (ref 12.0–15.0)
MCH: 24.7 pg — ABNORMAL LOW (ref 26.0–34.0)
MCHC: 29.7 g/dL — ABNORMAL LOW (ref 30.0–36.0)
MCV: 83.2 fL (ref 78.0–100.0)
PLATELETS: 250 10*3/uL (ref 150–400)
RBC: 3.68 MIL/uL — ABNORMAL LOW (ref 3.87–5.11)
RDW: 16.4 % — AB (ref 11.5–15.5)
WBC: 6.1 10*3/uL (ref 4.0–10.5)

## 2014-09-29 LAB — PROTIME-INR
INR: 0.99 (ref 0.00–1.49)
Prothrombin Time: 13.3 seconds (ref 11.6–15.2)

## 2014-09-29 LAB — PROCALCITONIN: Procalcitonin: <0.1 ng/mL

## 2014-09-29 LAB — MRSA PCR SCREENING: MRSA by PCR: NEGATIVE

## 2014-09-29 MED ORDER — SODIUM CHLORIDE 0.9 % IV SOLN
INTRAVENOUS | Status: DC
  Administered 2014-09-29 – 2014-10-02 (×3): via INTRAVENOUS

## 2014-09-29 MED ORDER — MORPHINE SULFATE 2 MG/ML IJ SOLN
2.0000 mg | INTRAMUSCULAR | Status: DC | PRN
  Administered 2014-09-29: 2 mg via INTRAVENOUS
  Filled 2014-09-29: qty 1

## 2014-09-29 MED ORDER — ALUM & MAG HYDROXIDE-SIMETH 200-200-20 MG/5ML PO SUSP
30.0000 mL | Freq: Four times a day (QID) | ORAL | Status: DC | PRN
  Administered 2014-10-02 – 2014-10-03 (×2): 30 mL via ORAL
  Filled 2014-09-29 (×3): qty 30

## 2014-09-29 MED ORDER — PIPERACILLIN-TAZOBACTAM 3.375 G IVPB
3.3750 g | Freq: Three times a day (TID) | INTRAVENOUS | Status: DC
  Administered 2014-09-29 – 2014-10-01 (×8): 3.375 g via INTRAVENOUS
  Filled 2014-09-29 (×10): qty 50

## 2014-09-29 MED ORDER — SODIUM CHLORIDE 0.9 % IV SOLN
INTRAVENOUS | Status: AC
  Administered 2014-09-29: 01:00:00 via INTRAVENOUS

## 2014-09-29 MED ORDER — PIPERACILLIN-TAZOBACTAM 3.375 G IVPB 30 MIN
3.3750 g | Freq: Once | INTRAVENOUS | Status: AC
  Administered 2014-09-29: 3.375 g via INTRAVENOUS
  Filled 2014-09-29: qty 50

## 2014-09-29 MED ORDER — ONDANSETRON HCL 4 MG/2ML IJ SOLN
4.0000 mg | Freq: Three times a day (TID) | INTRAMUSCULAR | Status: DC | PRN
  Administered 2014-09-30: 4 mg via INTRAVENOUS
  Filled 2014-09-29: qty 2

## 2014-09-29 MED ORDER — CIPROFLOXACIN HCL 0.3 % OP SOLN
1.0000 [drp] | OPHTHALMIC | Status: DC
  Administered 2014-09-29 – 2014-10-04 (×48): 1 [drp] via OPHTHALMIC
  Filled 2014-09-29 (×2): qty 2.5

## 2014-09-29 MED ORDER — OXYCODONE-ACETAMINOPHEN 5-325 MG PO TABS
1.0000 | ORAL_TABLET | ORAL | Status: DC | PRN
  Administered 2014-09-29 (×2): 1 via ORAL
  Filled 2014-09-29 (×2): qty 1

## 2014-09-29 MED ORDER — PNEUMOCOCCAL VAC POLYVALENT 25 MCG/0.5ML IJ INJ
0.5000 mL | INJECTION | INTRAMUSCULAR | Status: AC
  Administered 2014-09-30: 0.5 mL via INTRAMUSCULAR
  Filled 2014-09-29: qty 0.5

## 2014-09-29 MED ORDER — ALPRAZOLAM 0.25 MG PO TABS
0.2500 mg | ORAL_TABLET | Freq: Every evening | ORAL | Status: DC | PRN
  Administered 2014-09-29 – 2014-10-03 (×4): 0.25 mg via ORAL
  Filled 2014-09-29 (×6): qty 1

## 2014-09-29 MED ORDER — POTASSIUM CHLORIDE CRYS ER 20 MEQ PO TBCR
40.0000 meq | EXTENDED_RELEASE_TABLET | Freq: Once | ORAL | Status: AC
  Administered 2014-09-29: 40 meq via ORAL
  Filled 2014-09-29: qty 2

## 2014-09-29 MED ORDER — VANCOMYCIN HCL IN DEXTROSE 1-5 GM/200ML-% IV SOLN
1000.0000 mg | Freq: Once | INTRAVENOUS | Status: AC
  Administered 2014-09-29: 1000 mg via INTRAVENOUS
  Filled 2014-09-29: qty 200

## 2014-09-29 MED ORDER — VANCOMYCIN HCL IN DEXTROSE 1-5 GM/200ML-% IV SOLN
1000.0000 mg | Freq: Two times a day (BID) | INTRAVENOUS | Status: DC
  Administered 2014-09-29 – 2014-10-01 (×6): 1000 mg via INTRAVENOUS
  Filled 2014-09-29 (×6): qty 200

## 2014-09-29 MED ORDER — ALBUTEROL SULFATE (2.5 MG/3ML) 0.083% IN NEBU
3.0000 mL | INHALATION_SOLUTION | RESPIRATORY_TRACT | Status: DC | PRN
  Administered 2014-10-03: 3 mL via RESPIRATORY_TRACT
  Filled 2014-09-29: qty 3

## 2014-09-29 MED ORDER — HYDROMORPHONE HCL 1 MG/ML IJ SOLN
1.0000 mg | INTRAMUSCULAR | Status: DC | PRN
  Administered 2014-09-29 – 2014-10-04 (×28): 1 mg via INTRAVENOUS
  Filled 2014-09-29 (×29): qty 1

## 2014-09-29 MED ORDER — NICOTINE 21 MG/24HR TD PT24
21.0000 mg | MEDICATED_PATCH | Freq: Every day | TRANSDERMAL | Status: DC
  Administered 2014-09-29 – 2014-10-04 (×6): 21 mg via TRANSDERMAL
  Filled 2014-09-29 (×6): qty 1

## 2014-09-29 MED ORDER — HEPARIN SODIUM (PORCINE) 5000 UNIT/ML IJ SOLN
5000.0000 [IU] | Freq: Three times a day (TID) | INTRAMUSCULAR | Status: DC
  Administered 2014-09-29 – 2014-10-04 (×15): 5000 [IU] via SUBCUTANEOUS
  Filled 2014-09-29 (×17): qty 1

## 2014-09-29 MED ORDER — KETOTIFEN FUMARATE 0.025 % OP SOLN
1.0000 [drp] | Freq: Two times a day (BID) | OPHTHALMIC | Status: DC
  Administered 2014-09-29 – 2014-10-04 (×12): 1 [drp] via OPHTHALMIC
  Filled 2014-09-29: qty 5

## 2014-09-29 MED ORDER — SODIUM CHLORIDE 0.9 % IV BOLUS (SEPSIS)
1000.0000 mL | Freq: Once | INTRAVENOUS | Status: AC
  Administered 2014-09-29: 1000 mL via INTRAVENOUS

## 2014-09-29 MED ORDER — ACETAMINOPHEN 325 MG PO TABS: 650.0000 mg | ORAL_TABLET | Freq: Four times a day (QID) | ORAL | Status: DC | PRN

## 2014-09-29 MED ORDER — COLLAGENASE 250 UNIT/GM EX OINT
TOPICAL_OINTMENT | Freq: Every day | CUTANEOUS | Status: DC
  Administered 2014-09-30 – 2014-10-04 (×4): via TOPICAL
  Filled 2014-09-29 (×2): qty 30

## 2014-09-29 MED ORDER — OXYCODONE-ACETAMINOPHEN 5-325 MG PO TABS
1.0000 | ORAL_TABLET | ORAL | Status: DC | PRN
  Administered 2014-09-29 – 2014-10-04 (×28): 2 via ORAL
  Filled 2014-09-29 (×30): qty 2

## 2014-09-29 NOTE — Evaluation (Signed)
Physical Therapy Evaluation and Discharge Patient Details Name: Monique [name] MRN: [id number] DOB: 1975-11-20 Today's Date: 09/29/2014   History of Present Illness  39 y.o. female admitted with Osteomyelitis of right ankle and wound infection. Hx of infected R ORIF, removal of hardware, bipolar, tobacco abuse, polysubstance abuse, depression, anxiety.  Clinical Impression  Patient evaluated by Physical Therapy with no further acute PT needs identified. All education has been completed and the patient has no further questions. Patient states she is mobilizing at her baseline since surgery in July of 2016. She maintains NWB at all times, did not lose her balance during our ambulatory bout today, and denies any falls at home. Encouraged to perform gentle ROM of ankle however pt unwilling to perform. See below for any follow-up Physial Therapy or equipment needs. PT is signing off. Thank you for this referral.     Follow Up Recommendations No PT follow up (Outpatient PT when able to bear weight through RLE)    Equipment Recommendations  None recommended by PT    Recommendations for Other Services       Precautions / Restrictions Precautions Precautions: Fall Restrictions Weight Bearing Restrictions: Yes RLE Weight Bearing: Non weight bearing (From previous admission) Other Position/Activity Restrictions: No orders written, however pt was NWB on RLE during previous admission in July and pt states she has not been told this status is to change any time soon.      Mobility  Bed Mobility Overal bed mobility: Modified Independent             General bed mobility comments: extra time  Transfers Overall transfer level: Needs assistance Equipment used: Rolling walker (2 wheeled) Transfers: Sit to/from Stand Sit to Stand: Supervision         General transfer comment: VC for walker placement prior to standing and for proximity prior to sitting. Able to maintain NWB on  RLE.  Ambulation/Gait Ambulation/Gait assistance: Supervision Ambulation Distance (Feet): 75 Feet Assistive device: Rolling walker (2 wheeled) Gait Pattern/deviations:  ("hop to" pattern)   Gait velocity interpretation: at or above normal speed for age/gender General Gait Details: Required cues to slow down and not lift RW however pt disregards PT instructions. No loss of balance. Cues for walker placement frequently especially with turns.  Stairs            Wheelchair Mobility    Modified Rankin (Stroke Patients Only)       Balance Overall balance assessment: Needs assistance Sitting-balance support: No upper extremity supported;Feet supported Sitting balance-Leahy Scale: Normal     Standing balance support: Single extremity supported Standing balance-Leahy Scale: Poor                               Pertinent Vitals/Pain Pain Assessment: 0-10 Pain Score: 8  Pain Location: RT ankle Pain Descriptors / Indicators: Burning Pain Intervention(s): Monitored during session;Repositioned    Home Living Family/patient expects to be discharged to:: Private residence Living Arrangements: Other relatives Available Help at Discharge: Family;Available PRN/intermittently Type of Home: House Home Access: Level entry     Home Layout: One level Home Equipment: Walker - 2 wheels      Prior Function Level of Independence: Independent with assistive device(s)         Comments: has been using a RW since july     Hand Dominance   Dominant Hand: Right    Extremity/Trunk Assessment   Upper Extremity Assessment: Defer  to OT evaluation           Lower Extremity Assessment: RLE deficits/detail RLE Deficits / Details: edematous. Unable to fully assess due to pain and dressings applied to wounds. Pt with minimal ankle ROM as she reports increased pain.       Communication   Communication: No difficulties  Cognition Arousal/Alertness:  Awake/alert Behavior During Therapy: Agitated Overall Cognitive Status: Within Functional Limits for tasks assessed                      General Comments General comments (skin integrity, edema, etc.): Edematous Rt ankle. Pt easily agitated and complaining that she can't sleep in the hospital despite PT offering to return later this afternoon at her convinence.  States she has not had any falls at home and feels comfortable with a rolling walker. States someone will be at home most of the time to assist as needed. Feels she is at her baseline as far as mobility is concerned. eager to be able to Weight-bear through RLE again so she can progress her gait ability.    Exercises Other Exercises Other Exercises: Instructed on gentle ROM of ankle within tolerance however pt states "it hurts too bad to move it at all." educated on risks of immobility and encouraged to continue to move ankle within a tolerable range and progress as able.      Assessment/Plan    PT Assessment Patent does not need any further PT services  PT Diagnosis Difficulty walking;Abnormality of gait;Acute pain   PT Problem List    PT Treatment Interventions     PT Goals (Current goals can be found in the Care Plan section) Acute Rehab PT Goals Patient Stated Goal: Go home and be able to walk without a walker PT Goal Formulation: All assessment and education complete, DC therapy    Frequency     Barriers to discharge        Co-evaluation               End of Session   Activity Tolerance: Treatment limited secondary to agitation Patient left: in bed;with call bell/phone within reach Nurse Communication: Mobility status;Other (comment) (Requests if she can have a ginger-ale)         Time: 1610-9604 PT Time Calculation (min) (ACUTE ONLY): 16 min   Charges:   PT Evaluation $Initial PT Evaluation Tier I: 1 Procedure     PT G CodesBerton Mount 09/29/2014, 12:20 PM Sunday Spillers  [place], [place] [phone]

## 2014-09-29 NOTE — Consult Note (Signed)
WOC wound consult note Reason for Consult: Consult requested for right ankle wounds.  Pt had recent surgery in July for osteomyelitis and removal of infected hardware by Dr [name] of the ortho service. Pt states she lost all her paperwork and was unable to get her antibiotic prescription filled, or recall her follow-up apt information.  Her leg recently swelled, became very painful, and "burst open most of the stiches and began draining." Wound type: Full thickness wounds in 3 locations. Wound bed: Inner ankle 3X.2X.2cm, yellow moist wound bed, small amt yellow drainage, no odor. Inner heel with intact clear fluid filled blister, 2.5X2cm, no open wound or drainage. Anterior ankle 3.5X2X.3cm, 20% yellow, 80% red, mod amt yellow drainage, no odor, 1 suture remains in place. Outer ankle 6X.2X.2cm, few sutures remain in place, yellow moist wound bed, small amt yellow drainage, no odor. Periwound: Generalized edema and erythremia surrounding wound bed. Dressing procedure/placement/frequency: Discussed plan of care with primary team.  He plans to consult ortho team for further plan of care since this involves post-op wounds and osteomyelitis is beyond WOC scope of practice.  Foam dressings applied to protect wound beds until further plan of care available. Please contact ortho team for further questions. Please re-consult if further assistance is needed.  Thank-you,  Cammie Mcgee MSN, RN, CWOCN, [place], CNS [phone]

## 2014-09-29 NOTE — Progress Notes (Signed)
ANTIBIOTIC CONSULT NOTE - INITIAL  Pharmacy Consult for Vancocin and Zosyn Indication: osteomyelitis  No Known Allergies  Patient Measurements: Height: 5\' 4"  (162.6 cm) Weight: 180 lb (81.647 kg) IBW/kg (Calculated) : 54.7  Vital Signs: Temp: 98.4 F (36.9 C) (08/03 0220) Temp Source: Oral (08/03 0220) BP: 115/66 mmHg (08/03 0220) Pulse Rate: 58 (08/03 0220)  Labs:  Recent Labs  09/28/14 2034  WBC 5.2  HGB 9.8*  PLT 287  CREATININE 0.68   Estimated Creatinine Clearance: 98.6 mL/min (by C-G formula based on Cr of 0.68).   Microbiology: Recent Results (from the past 720 hour(s))  Surgical pcr screen     Status: None   Collection Time: 09/01/14  7:00 AM  Result Value Ref Range Status   MRSA, PCR NEGATIVE NEGATIVE Final   Staphylococcus aureus NEGATIVE NEGATIVE Final    Comment:        The Xpert SA Assay (FDA approved for NASAL specimens in patients over 12 years of age), is one component of a comprehensive surveillance program.  Test performance has been validated by [facility] for patients greater than or equal to 69 year old. It is not intended to diagnose infection nor to guide or monitor treatment.   Anaerobic culture     Status: None   Collection Time: 09/02/14  6:00 PM  Result Value Ref Range Status   Specimen Description ANKLE RIGHT  Final   Special Requests PATIENT ON FOLLOWING ROCEFIN  Final   Gram Stain   Final    NO WBC SEEN NO SQUAMOUS EPITHELIAL CELLS SEEN NO ORGANISMS SEEN Performed at [facility]    Culture   Final    NO ANAEROBES ISOLATED Performed at [facility]    Report Status 09/08/2014 FINAL  Final  Wound culture     Status: None   Collection Time: 09/02/14  6:00 PM  Result Value Ref Range Status   Specimen Description ANKLE RIGHT  Final   Special Requests PATIENT ON FOLLOWING ROCEFIN  Final   Gram Stain   Final    NO WBC SEEN NO SQUAMOUS EPITHELIAL CELLS SEEN NO ORGANISMS SEEN Performed at [facility]    Culture   Final    NO GROWTH 2 DAYS Performed at [facility]    Report Status 09/05/2014 FINAL  Final    Medical History: Past Medical History  Diagnosis Date  . MRSA infection   . Bipolar disorder     Medications:  Prescriptions prior to admission  Medication Sig Dispense Refill Last Dose  . acetaminophen (TYLENOL) 325 MG tablet Take 2 tablets (650 mg total) by mouth every 6 (six) hours as needed for mild pain (or Fever >/= 101). 30 tablet 0 Past Week at Unknown time  . albuterol (PROVENTIL HFA;VENTOLIN HFA) 108 (90 BASE) MCG/ACT inhaler Inhale 2 puffs into the lungs every 4 (four) hours as needed for wheezing or shortness of breath. (Patient not taking: Reported on 09/29/2014) 1 Inhaler 0 Not Taking at Unknown time  . ALPRAZolam (XANAX) 0.25 MG tablet Take 1 tablet (0.25 mg total) by mouth at bedtime as needed for anxiety. (Patient not taking: Reported on 09/29/2014) 10 tablet 0 Not Taking at Unknown time  . buPROPion (WELLBUTRIN) 100 MG tablet Take 1 tablet (100 mg total) by mouth 2 (two) times daily. (Patient not taking: Reported on 09/29/2014) 60 tablet 0 Not Taking at Unknown time  . oxyCODONE-acetaminophen (PERCOCET/ROXICET) 5-325 MG per tablet Take 1 tablet by mouth every 4 (  four) hours as needed for moderate pain. (Patient not taking: Reported on 09/29/2014) 20 tablet 0 Not Taking at Unknown time  . sulfamethoxazole-trimethoprim (BACTRIM DS,SEPTRA DS) 800-160 MG per tablet Take 1 tablet by mouth 2 (two) times daily. (Patient not taking: Reported on 09/29/2014) 60 tablet 2 Completed Course at Unknown time   Scheduled:  . sodium chloride   Intravenous STAT  . heparin  5,000 Units Subcutaneous 3 times per day  . ketotifen  1 drop Left Eye BID  . nicotine  21 mg Transdermal Daily  . sodium chloride  1,000 mL Intravenous Once    Assessment: 38yo female w/ h/o septic arthritis had hardware removal of right ankle on 09/02/14 after dx of osteomyelitis but did not take ABX  Rx'd at discharge, now c/o constant pain, swelling, and drainage, wound is open, to begin IV ABX for concern for continued osteo and wound infection.  Goal of Therapy:  Vancomycin trough level 15-20 mcg/ml  Plan:  Will begin vancomycin  IV Q12H (based on trough during last admission 71mo ago) and Zosyn 3.375g IV Q8H and monitor CBC, Cx, levels prn.  Vernard Gambles, PharmD, BCPS  09/29/2014,2:28 AM

## 2014-09-29 NOTE — H&P (Signed)
[facility] History and Physical  Monique [name] ZOX:[id number] DOB: 1975-09-10 DOA: 09/28/2014  Referring physician: ED physician PCP: Default, Provider, MD  Specialists:   Chief Complaint: Left ankle pain and left eye watering  HPI: Monique [name] is a 39 y.o. female with PMH of bipolar, tobacco abuse, polysubstance abuse, depression, anxiety, who presents with left ankle pain, left ankle wound infection and left eye watering.  Patient was recently hospitalized from 7/3 to 7/11 due to chronic osteomyelitis of the tibiotalar joint, drug abuse, acute respiratory failure and encephalopathy.  She had MRI of the right ankle 08/30/2014, which showed septic arthritis and osteomyelitis of the tibiotalar joint. She is S/P removal of all hardware 09/02/2014 by Dr. [name]. She was treated with vancomycin and Rocephin in the hospital, and discharged on Bactrim which she is supposed to take for 12 weeks. Patient reports that she has not been taking antibotics after she went home. Her right ankle pain and swelling have been progressively getting worse. She not have fever or chills. She reports that she has left eye watering in the past 2 or 3 days and pain in the inside of left upper eyelid. No vision change. She does not have chest pain, shortness of breath, cough, abdominal pain, diarrhea, unilateral weakness.  In ED, patient was found to have WBC 5.2, temperature 97.7, no tachycardia, electrolytes okay. Since admitted to the inpatient further evaluation and treatment.  Where does patient live?   At home   Can patient participate in ADLs?  Yes     Review of Systems:   General: no fevers, chills, no changes in body weight, has poor appetite, has fatigue HEENT: no blurry vision, hearing changes or sore throat. Has left eye watering and pain. Pulm: no dyspnea, coughing, wheezing CV: no chest pain, palpitations Abd: no nausea, vomiting, abdominal pain, diarrhea, constipation GU: no dysuria,  burning on urination, increased urinary frequency, hematuria  Ext: has right ankle wound infection and swelling. Neuro: no unilateral weakness, numbness, or tingling, no vision change or hearing loss Skin: no rash MSK: No muscle spasm, no deformity, no limitation of range of movement in spin Heme: No easy bruising.  Travel history: No recent long distant travel.  Allergy: No Known Allergies  Past Medical History  Diagnosis Date  . MRSA infection   . Bipolar disorder     Past Surgical History  Procedure Laterality Date  . Tubal ligation    . Hardware removal Right 09/02/2014    Procedure: Removal Right Lateral Ankle Hardware, Place Antibiotic Bead ;  Surgeon: [name], MD;  Location: [facility];  Service: Orthopedics;  Laterality: Right;    Social History:  reports that she has been smoking Cigarettes.  She has been smoking about 1.00 pack per day. She has never used smokeless tobacco. She reports that she drinks about 1.2 oz of alcohol per week. She reports that she uses illicit drugs (Cocaine, Marijuana, and Benzodiazepines).  Family History:  Family History  Problem Relation Age of Onset  . Cancer Mother     right hip cancer      Prior to Admission medications   Medication Sig Start Date End Date Taking? Authorizing Provider  acetaminophen (TYLENOL) 325 MG tablet Take 2 tablets (650 mg total) by mouth every 6 (six) hours as needed for mild pain (or Fever >/= 101). 09/06/14   Alison Murray, MD  albuterol (PROVENTIL HFA;VENTOLIN HFA) 108 (90 BASE) MCG/ACT inhaler Inhale 2 puffs into the lungs every 4 (  four) hours as needed for wheezing or shortness of breath. 09/06/14   Alison Murray, MD  ALPRAZolam Prudy Feeler) 0.25 MG tablet Take 1 tablet (0.25 mg total) by mouth at bedtime as needed for anxiety. 09/06/14   Alison Murray, MD  buPROPion (WELLBUTRIN) 100 MG tablet Take 1 tablet (100 mg total) by mouth 2 (two) times daily. 09/06/14   Alison Murray, MD  oxyCODONE-acetaminophen  (PERCOCET/ROXICET) 5-325 MG per tablet Take 1 tablet by mouth every 4 (four) hours as needed for moderate pain. 09/06/14   Alison Murray, MD  sulfamethoxazole-trimethoprim (BACTRIM DS,SEPTRA DS) 800-160 MG per tablet Take 1 tablet by mouth 2 (two) times daily. 09/06/14   Alison Murray, MD    Physical Exam: Filed Vitals:   09/29/14 0130 09/29/14 0200 09/29/14 0220 09/29/14 0651  BP: 131/71 107/46 115/66 100/46  Pulse: 71 65 58 61  Temp:   98.4 F (36.9 C) 100.6 F (38.1 C)  TempSrc:   Oral Oral  Resp: 23 20 22 20   Height:      Weight:      SpO2: 98% 98% 99% 99%   General: Not in acute distress HEENT:       Eyes: PERRL, EOMI, no scleral icterus.       ENT: No discharge from the ears and nose, no pharynx injection, no tonsillar enlargement.        Neck: No JVD, no bruit, no mass felt. Heme: No neck lymph node enlargement. Cardiac: S1/S2, RRR, No murmurs, No gallops or rubs. Pulm: No rales, wheezing, rhonchi or rubs. Abd: Soft, nondistended, nontender, no rebound pain, no organomegaly, BS present. Ext: R ankle has 3 surgical scars, all are open with surrounding erythema, tenderness, swelling, warmth. P/PT pulse presents bilaterally  Musculoskeletal: No joint deformities, No joint redness or warmth, no limitation of ROM in spin. Skin: No rashes.  Neuro: Alert, oriented X3, cranial nerves II-XII grossly intact, muscle strength 5/5 in all extremities, sensation to light touch intact.  Psych: Patient is not psychotic, no suicidal or hemocidal ideation.  Labs on Admission:  Basic Metabolic Panel:  Recent Labs Lab 09/28/14 2034 09/29/14 0400  NA 140 140  K 3.7 3.3*  CL 110 109  CO2 22 24  GLUCOSE 132* 128*  BUN 10 10  CREATININE 0.68 0.66  CALCIUM 8.8* 8.5*   Liver Function Tests:  Recent Labs Lab 09/28/14 2034  AST 21  ALT 15  ALKPHOS 53  BILITOT 0.1*  PROT 6.6  ALBUMIN 3.3*   No results for input(s): LIPASE, AMYLASE in the last 168 hours. No results for input(s):  AMMONIA in the last 168 hours. CBC:  Recent Labs Lab 09/28/14 2034 09/29/14 0400  WBC 5.2 6.1  NEUTROABS 2.3  --   HGB 9.8* 9.1*  HCT 33.3* 30.6*  MCV 82.8 83.2  PLT 287 250   Cardiac Enzymes: No results for input(s): CKTOTAL, CKMB, CKMBINDEX, TROPONINI in the last 168 hours.  BNP (last 3 results) No results for input(s): BNP in the last 8760 hours.  ProBNP (last 3 results) No results for input(s): PROBNP in the last 8760 hours.  CBG: No results for input(s): GLUCAP in the last 168 hours.  Radiological Exams on Admission: Dg Ankle Complete Right  09/29/2014   CLINICAL DATA:  Pain and swelling.  Recent surgery.  EXAM: RIGHT ANKLE - COMPLETE 3+ VIEW  COMPARISON:  MRI 08/30/2014, radiographs 08/30/2014  FINDINGS: The distal fibular plate screw hardware has been removed. The inter osseous screws  and medial malleolar screws have also been removed. A single screw remains at the lateral aspect of the distal fibula. The screw tracts remain clearly visible. There is no acute fracture. There is mild ossification of the interosseous ligament. No frank bony destruction. Mortise is slightly widened laterally. Talar dome appears intact.  IMPRESSION: Hardware removal. No frank bony destruction. No fracture or dislocation.   Electronically Signed   By: Ellery Plunk M.D.   On: 09/29/2014 00:24    EKG: Not done in ED, will get one.   Assessment/Plan Principal Problem:   Osteomyelitis of right ankle Active Problems:   Drug abuse   Cocaine abuse   Wound infection after surgery   Irritation of left eye   Wound, surgical, infected  Osteomyelitis of right ankle and wound infection: symptoms have been worsening due to noncompliance to antibodies. Patient is not septic on admission. Analysis stable.  -will admit to med-surg bed -start vancomycin and zosyn -wound care consult -will get Procalcitonin and trend lactic acid levels -IVF: 1L of NS bolus in ED, followed by 125 cc/h  -pain  control -When necessary Zofran for nausea  Polysubstance abuse:  including tobacco abuse, cocaine abuse, THC -Did counseling about importance of quitting substance use -Nicotine patch  Irritation of left eye: Etiology is not clear. -try Kitotifen eye drop for possible allergy -if worse, may get ophthalmologist consult.  DVT ppx: SQ Heparin   Code Status: Full code Family Communication: None at bed side.     Disposition Plan: Admit to inpatient   Date of Service 09/29/2014    Lorretta Harp [facility] Pager [phone]  If 7PM-7AM, please contact night-coverage [email or web address] Password [email or web address] 09/29/2014, 7:53 AM

## 2014-09-29 NOTE — Consult Note (Signed)
Reason for Consult: Infected right ankle status post removal of deep retained hardware and placement of antibiotics beads Referring Physician: Dr. Lupita Leash Fjeld is an 39 y.o. female.  HPI: Patient is a 39 year old woman who presented 4 weeks ago with an infected right ankle status post open reduction internal fixation. At this time she underwent open arthrotomy of the ankle joint open incisions both medially and laterally to remove remove the deep retained hardware and the bone and soft tissue were packed with antibiotics beads including the joint. Patient was discharged on oral antibiotics. Patient states she lost the prescription for the oral antibiotics lost the appointment card for follow-up but did not miss place her narcotic pain medications.  Past Medical History  Diagnosis Date  . MRSA infection   . Bipolar disorder     Past Surgical History  Procedure Laterality Date  . Tubal ligation    . Hardware removal Right 09/02/2014    Procedure: Removal Right Lateral Ankle Hardware, Place Antibiotic Bead ;  Surgeon: Newt Minion, MD;  Location: [facility];  Service: Orthopedics;  Laterality: Right;    Family History  Problem Relation Age of Onset  . Cancer Mother     right hip cancer     Social History:  reports that she has been smoking Cigarettes.  She has been smoking about 1.00 pack per day. She has never used smokeless tobacco. She reports that she drinks about 1.2 oz of alcohol per week. She reports that she uses illicit drugs (Cocaine, Marijuana, and Benzodiazepines).  Allergies: No Known Allergies  Medications: I have reviewed the patient's current medications.  Results for orders placed or performed during the hospital encounter of 09/28/14 (from the past 48 hour(s))  Comprehensive metabolic panel     Status: Abnormal   Collection Time: 09/28/14  8:34 PM  Result Value Ref Range   Sodium 140 135 - 145 mmol/L   Potassium 3.7 3.5 - 5.1 mmol/L   Chloride 110 101 -  111 mmol/L   CO2 22 22 - 32 mmol/L   Glucose, Bld 132 (H) 65 - 99 mg/dL   BUN 10 6 - 20 mg/dL   Creatinine, Ser 0.68 0.44 - 1.00 mg/dL   Calcium 8.8 (L) 8.9 - 10.3 mg/dL   Total Protein 6.6 6.5 - 8.1 g/dL   Albumin 3.3 (L) 3.5 - 5.0 g/dL   AST 21 15 - 41 U/L   ALT 15 14 - 54 U/L   Alkaline Phosphatase 53 38 - 126 U/L   Total Bilirubin 0.1 (L) 0.3 - 1.2 mg/dL   GFR calc non Af Amer >60 >60 mL/min   GFR calc Af Amer >60 >60 mL/min    Comment: (NOTE) The eGFR has been calculated using the CKD EPI equation. This calculation has not been validated in all clinical situations. eGFR's persistently <60 mL/min signify possible Chronic Kidney Disease.    Anion gap 8 5 - 15  CBC with Differential     Status: Abnormal   Collection Time: 09/28/14  8:34 PM  Result Value Ref Range   WBC 5.2 4.0 - 10.5 K/uL   RBC 4.02 3.87 - 5.11 MIL/uL   Hemoglobin 9.8 (L) 12.0 - 15.0 g/dL   HCT 33.3 (L) 36.0 - 46.0 %   MCV 82.8 78.0 - 100.0 fL   MCH 24.4 (L) 26.0 - 34.0 pg   MCHC 29.4 (L) 30.0 - 36.0 g/dL   RDW 16.5 (H) 11.5 - 15.5 %   Platelets  287 150 - 400 K/uL   Neutrophils Relative % 45 43 - 77 %   Neutro Abs 2.3 1.7 - 7.7 K/uL   Lymphocytes Relative 47 (H) 12 - 46 %   Lymphs Abs 2.4 0.7 - 4.0 K/uL   Monocytes Relative 6 3 - 12 %   Monocytes Absolute 0.3 0.1 - 1.0 K/uL   Eosinophils Relative 2 0 - 5 %   Eosinophils Absolute 0.1 0.0 - 0.7 K/uL   Basophils Relative 0 0 - 1 %   Basophils Absolute 0.0 0.0 - 0.1 K/uL  MRSA PCR Screening     Status: None   Collection Time: 09/29/14  2:59 AM  Result Value Ref Range   MRSA by PCR NEGATIVE NEGATIVE    Comment:        The GeneXpert MRSA Assay (FDA approved for NASAL specimens only), is one component of a comprehensive MRSA colonization surveillance program. It is not intended to diagnose MRSA infection nor to guide or monitor treatment for MRSA infections.   Lactic acid, plasma     Status: None   Collection Time: 09/29/14  4:00 AM  Result  Value Ref Range   Lactic Acid, Venous 1.3 0.5 - 2.0 mmol/L  Procalcitonin     Status: None   Collection Time: 09/29/14  4:00 AM  Result Value Ref Range   Procalcitonin <0.10 ng/mL    Comment:        Interpretation: PCT (Procalcitonin) <= 0.5 ng/mL: Systemic infection (sepsis) is not likely. Local bacterial infection is possible. (NOTE)         ICU PCT Algorithm               Non ICU PCT Algorithm    ----------------------------     ------------------------------         PCT < 0.25 ng/mL                 PCT < 0.1 ng/mL     Stopping of antibiotics            Stopping of antibiotics       strongly encouraged.               strongly encouraged.    ----------------------------     ------------------------------       PCT level decrease by               PCT < 0.25 ng/mL       >= 80% from peak PCT       OR PCT 0.25 - 0.5 ng/mL          Stopping of antibiotics                                             encouraged.     Stopping of antibiotics           encouraged.    ----------------------------     ------------------------------       PCT level decrease by              PCT >= 0.25 ng/mL       < 80% from peak PCT        AND PCT >= 0.5 ng/mL            Continuin g antibiotics  encouraged.       Continuing antibiotics            encouraged.    ----------------------------     ------------------------------     PCT level increase compared          PCT > 0.5 ng/mL         with peak PCT AND          PCT >= 0.5 ng/mL             Escalation of antibiotics                                          strongly encouraged.      Escalation of antibiotics        strongly encouraged.   Protime-INR     Status: None   Collection Time: 09/29/14  4:00 AM  Result Value Ref Range   Prothrombin Time 13.3 11.6 - 15.2 seconds   INR 0.99 0.00 - 1.49  APTT     Status: None   Collection Time: 09/29/14  4:00 AM  Result Value Ref Range   aPTT 28 24 - 37 seconds   Basic metabolic panel     Status: Abnormal   Collection Time: 09/29/14  4:00 AM  Result Value Ref Range   Sodium 140 135 - 145 mmol/L   Potassium 3.3 (L) 3.5 - 5.1 mmol/L   Chloride 109 101 - 111 mmol/L   CO2 24 22 - 32 mmol/L   Glucose, Bld 128 (H) 65 - 99 mg/dL   BUN 10 6 - 20 mg/dL   Creatinine, Ser 0.66 0.44 - 1.00 mg/dL   Calcium 8.5 (L) 8.9 - 10.3 mg/dL   GFR calc non Af Amer >60 >60 mL/min   GFR calc Af Amer >60 >60 mL/min    Comment: (NOTE) The eGFR has been calculated using the CKD EPI equation. This calculation has not been validated in all clinical situations. eGFR's persistently <60 mL/min signify possible Chronic Kidney Disease.    Anion gap 7 5 - 15  CBC     Status: Abnormal   Collection Time: 09/29/14  4:00 AM  Result Value Ref Range   WBC 6.1 4.0 - 10.5 K/uL   RBC 3.68 (L) 3.87 - 5.11 MIL/uL   Hemoglobin 9.1 (L) 12.0 - 15.0 g/dL   HCT 30.6 (L) 36.0 - 46.0 %   MCV 83.2 78.0 - 100.0 fL   MCH 24.7 (L) 26.0 - 34.0 pg   MCHC 29.7 (L) 30.0 - 36.0 g/dL   RDW 16.4 (H) 11.5 - 15.5 %   Platelets 250 150 - 400 K/uL  Lactic acid, plasma     Status: None   Collection Time: 09/29/14  6:15 AM  Result Value Ref Range   Lactic Acid, Venous 0.9 0.5 - 2.0 mmol/L    Dg Ankle Complete Right  09/29/2014   CLINICAL DATA:  Pain and swelling.  Recent surgery.  EXAM: RIGHT ANKLE - COMPLETE 3+ VIEW  COMPARISON:  MRI 08/30/2014, radiographs 08/30/2014  FINDINGS: The distal fibular plate screw hardware has been removed. The inter osseous screws and medial malleolar screws have also been removed. A single screw remains at the lateral aspect of the distal fibula. The screw tracts remain clearly visible. There is no acute fracture. There is mild ossification of the interosseous ligament. No frank bony destruction. Mortise is  slightly widened laterally. Talar dome appears intact.  IMPRESSION: Hardware removal. No frank bony destruction. No fracture or dislocation.   Electronically Signed   By:  Andreas Newport M.D.   On: 09/29/2014 00:24    Review of Systems  All other systems reviewed and are negative.  Blood pressure 106/65, pulse 58, temperature 98.2 F (36.8 C), temperature source Oral, resp. rate 18, height '5\' 4"'  (1.626 m), weight 81.647 kg (180 lb), SpO2 99 %. Physical Exam On examination the patient's wounds have gaped open there is some fibrinous exudative tissue as well as granulation tissue at the base of the wounds. There is no cellulitis around her legs. The radiographs shows no destructive bony changes.  Assessment/Plan: Assessment: Recurrent infection right ankle status post removal deep retained hardware and placement of antibiotics beads about 4 weeks ago.  Plan: Anticipate patient will need at least one week of IV antibiotics as an inpatient. Ideally she would need to be discharged on IV antibiotics but her reliability to comply with IV antibiotics may be quite difficult. I will order hydrotherapy and Santyl dressing changes as well as removal of the few remaining sutures. I will be out of town until Tuesday and we'll reevaluate Tuesday. If not making good progress would plan on repeat irrigation and debridement next week.  Zylpha Poynor V 09/29/2014, 6:18 PM

## 2014-09-29 NOTE — Progress Notes (Signed)
Patient admitted earlier this morning. H&P reviewed. Patient seen and examined.  S: The patient complains of pain in the right ankle. She states that she lost her prescription for the antibiotics when she was discharged 2 weeks ago. So, as a result, she hasn't taken any medications. A few days ago the leg and foot started swelling. Her sutures came off. She has noticed some redness.  O: Vital signs are stable. Low-grade fever noted overnight.  Noted to have injected conjunctiva of the left eye. Clear drainage is noted. Lungs are clear to auscultation bilaterally. No wheezing, rales or rhonchi.  Cardiac vascular system, S1S2 is normal, regular, no S3, S4, no rubs, murmurs, or bruit Abdomen is soft, nontender, nondistended Right ankle is swollen. Some areas of erythema is noted. Tender to palpation. No area of fluctuation. There is some dehiscence of her incisions. No active drainage is appreciated.  A/P: Osteomyelitis involving tibiotalar joint, along with recent septic arthritis of the same area. Patient underwent removal of hardware a few weeks ago. She was discharged on oral antibiotics. However, she did not fill her prescription. Comes in with worsening swelling of that ankle with some areas of erythema. Continue IV antibiotics as ordered. Discussed with Dr. [name] who will evaluate the patient later today.  Possible gingivitis left eye: Order antibiotics eyedrops. If there is no improvement, she may need an ophthalmological input.  History of polysubstance abuse: She has been counseled.  We'll continue to monitor  Monique [name] 1:02 PM

## 2014-09-29 NOTE — Care Management Note (Signed)
Case Management Note  Patient Details  Name: GRAE LEATHERS MRN: [id number] Date of Birth: [birth year]  Subjective/Objective:                    Action/Plan: Patient was admitted as self pay. She is being followed by Financial Counseling as a continuation from previous admission.  CM will continue to follow for discharge needs.  Expected Discharge Date:                  Expected Discharge Plan:   (patient lives at home with relatives (grandmother))  In-House Referral:     Discharge planning Services     Post Acute Care Choice:    Choice offered to:     DME Arranged:    DME Agency:     HH Arranged:    HH Agency:     Status of Service:  In process, will continue to follow  Medicare Important Message Given:    Date Medicare IM Given:    Medicare IM give by:    Date Additional Medicare IM Given:    Additional Medicare Important Message give by:     If discussed at Long Length of Stay Meetings, dates discussed:    Additional Comments:  Anda Kraft, RN 09/29/2014, 11:55 AM

## 2014-09-30 DIAGNOSIS — H109 Unspecified conjunctivitis: Secondary | ICD-10-CM | POA: Insufficient documentation

## 2014-09-30 DIAGNOSIS — F1410 Cocaine abuse, uncomplicated: Secondary | ICD-10-CM

## 2014-09-30 DIAGNOSIS — M009 Pyogenic arthritis, unspecified: Secondary | ICD-10-CM | POA: Insufficient documentation

## 2014-09-30 DIAGNOSIS — M869 Osteomyelitis, unspecified: Secondary | ICD-10-CM

## 2014-09-30 LAB — CBC
HEMATOCRIT: 33.5 % — AB (ref 36.0–46.0)
Hemoglobin: 9.9 g/dL — ABNORMAL LOW (ref 12.0–15.0)
MCH: 24.9 pg — ABNORMAL LOW (ref 26.0–34.0)
MCHC: 29.6 g/dL — ABNORMAL LOW (ref 30.0–36.0)
MCV: 84.2 fL (ref 78.0–100.0)
Platelets: 257 10*3/uL (ref 150–400)
RBC: 3.98 MIL/uL (ref 3.87–5.11)
RDW: 15.9 % — ABNORMAL HIGH (ref 11.5–15.5)
WBC: 5.1 10*3/uL (ref 4.0–10.5)

## 2014-09-30 LAB — BASIC METABOLIC PANEL
Anion gap: 4 — ABNORMAL LOW (ref 5–15)
BUN: 14 mg/dL (ref 6–20)
CALCIUM: 8.6 mg/dL — AB (ref 8.9–10.3)
CHLORIDE: 107 mmol/L (ref 101–111)
CO2: 26 mmol/L (ref 22–32)
CREATININE: 0.81 mg/dL (ref 0.44–1.00)
GFR calc Af Amer: >60 mL/min (ref >60)
GFR calc non Af Amer: >60 mL/min (ref >60)
GLUCOSE: 115 mg/dL — AB (ref 65–99)
POTASSIUM: 3.9 mmol/L (ref 3.5–5.1)
SODIUM: 137 mmol/L (ref 135–145)

## 2014-09-30 NOTE — Progress Notes (Signed)
PROGRESS NOTE  Monique [name] TTS:[id number] DOB: 08/09/1975 DOA: 09/28/2014 PCP: Default, Provider, MD  Brief history 39 year old female with history of bipolar disorder, polysubstance abuse, tobacco use, anxiety, depression presented with increasing right ankle swelling and pain. 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 The patient had removal of her hardware on 09/02/2014 performed by Dr. Sharol Given. The patient was treated with vancomycin and ceftriaxone during her hospitalization. Because of her history of substance abuse, the patient was discharged with oral trimethoprim sulfamethoxazole for 12 weeks. Unfortunately, the patient never got the prescription filled. As result, she had progressive pain and swelling in her right ankle. Assessment/Plan: Septic arthritis/right ankle osteomyelitis--tibiotalar joint -Noncompliant with antibiotics  -Continue vancomycin and Zosyn pending culture data  -ESR, CRP  -I had a frank discussion with the patient regarding her substance abuse history and the only way she can receive intravenous antibiotics would be for her to go to a skilled nursing facility  -She will make the final decision in the next 24 hours  -The patient is agreeable to skilled nursing facility, PICC line will be placed and the patient will be discharged with antibiotics for 6 additional weeks  -appreciate Dr. Sharol Given consult-->hydrotherapy Polysubstance abuse -Including cocaine, THC, tobacco -Tobacco cessation discussed -NicoDerm patch -HIV Left eye conjunctivitis -Improving with Ciloxan Hypokalemia -Repleted Noncompliance with medical therapy -will need to go to SNF for IV abx if pt is agreeable   Family Communication:   Pt at beside Disposition Plan:   SNF in 2-3 days if pt agreeable      Procedures/Studies: Dg Ankle Complete Right  09/29/2014    CLINICAL DATA:  Pain and swelling.  Recent surgery.  EXAM: RIGHT ANKLE - COMPLETE 3+ VIEW  COMPARISON:  MRI 08/30/2014, radiographs 08/30/2014  FINDINGS: The distal fibular plate screw hardware has been removed. The inter osseous screws and medial malleolar screws have also been removed. A single screw remains at the lateral aspect of the distal fibula. The screw tracts remain clearly visible. There is no acute fracture. There is mild ossification of the interosseous ligament. No frank bony destruction. Mortise is slightly widened laterally. Talar dome appears intact.  IMPRESSION: Hardware removal. No frank bony destruction. No fracture or dislocation.   Electronically Signed   By: Andreas Newport M.D.   On: 09/29/2014 00:24         Subjective:  patient complains of right ankle pain. She denies any fevers, chills, chest pain, shortness of breath, nausea, vomiting or diarrhea, or pain. Right Eye improving. No vision loss or decreased vision.  Objective: Filed Vitals:   09/30/14 0201 09/30/14 0646 09/30/14 1026 09/30/14 1316  BP: 120/60 115/74 101/54 117/52  Pulse: 52 59 52 50  Temp: 98.2 F (36.8 C) 98.4 F (36.9 C) 98.4 F (36.9 C) 98.7 F (37.1 C)  TempSrc: Oral Oral Oral Oral  Resp: '18 18 20 20  ' Height:      Weight:      SpO2: 99% 99% 100% 100%   No intake or output data in the 24 hours ending 09/30/14 1552 Weight change:  Exam:   General:  Pt is alert, follows commands appropriately, not in acute distress  HEENT: No icterus, No thrush, No neck mass, [place]/AT  Cardiovascular: RRR, S1/S2, no rubs, no gallops  Respiratory: CTA bilaterally, no wheezing, no crackles, no rhonchi  Abdomen: Soft/+BS, non tender,  non distended, no guarding; no hepatosplenomegaly  Extremities:   right ankle with scant amount of yellow drainage without crepitance or necrosis. Data Reviewed: Basic Metabolic Panel:  Recent Labs Lab 09/28/14 2034 09/29/14 0400 09/30/14 0445  NA 140 140 137  K  3.7 3.3* 3.9  CL 110 109 107  CO2 '22 24 26  ' GLUCOSE 132* 128* 115*  BUN '10 10 14  ' CREATININE 0.68 0.66 0.81  CALCIUM 8.8* 8.5* 8.6*   Liver Function Tests:  Recent Labs Lab 09/28/14 2034  AST 21  ALT 15  ALKPHOS 53  BILITOT 0.1*  PROT 6.6  ALBUMIN 3.3*   No results for input(s): LIPASE, AMYLASE in the last 168 hours. No results for input(s): AMMONIA in the last 168 hours. CBC:  Recent Labs Lab 09/28/14 2034 09/29/14 0400 09/30/14 0445  WBC 5.2 6.1 5.1  NEUTROABS 2.3  --   --   HGB 9.8* 9.1* 9.9*  HCT 33.3* 30.6* 33.5*  MCV 82.8 83.2 84.2  PLT 287 250 257   Cardiac Enzymes: No results for input(s): CKTOTAL, CKMB, CKMBINDEX, TROPONINI in the last 168 hours. BNP: Invalid input(s): POCBNP CBG: No results for input(s): GLUCAP in the last 168 hours.  Recent Results (from the past 240 hour(s))  Culture, blood (x 2)     Status: None (Preliminary result)   Collection Time: 09/29/14 12:30 AM  Result Value Ref Range Status   Specimen Description BLOOD LEFT ARM  Final   Special Requests BOTTLES DRAWN AEROBIC AND ANAEROBIC 5ML  Final   Culture NO GROWTH 1 DAY  Final   Report Status PENDING  Incomplete  MRSA PCR Screening     Status: None   Collection Time: 09/29/14  2:59 AM  Result Value Ref Range Status   MRSA by PCR NEGATIVE NEGATIVE Final    Comment:        The GeneXpert MRSA Assay (FDA approved for NASAL specimens only), is one component of a comprehensive MRSA colonization surveillance program. It is not intended to diagnose MRSA infection nor to guide or monitor treatment for MRSA infections.   Culture, blood (x 2)     Status: None (Preliminary result)   Collection Time: 09/29/14  4:00 AM  Result Value Ref Range Status   Specimen Description BLOOD RIGHT ANTECUBITAL  Final   Special Requests BOTTLES DRAWN AEROBIC ONLY 5CC  Final   Culture NO GROWTH 1 DAY  Final   Report Status PENDING  Incomplete     Scheduled Meds: . ciprofloxacin  1 drop Left Eye  Q2H while awake  . collagenase   Topical Daily  . heparin  5,000 Units Subcutaneous 3 times per day  . ketotifen  1 drop Left Eye BID  . nicotine  21 mg Transdermal Daily  . piperacillin-tazobactam (ZOSYN)  IV  3.375 g Intravenous Q8H  . vancomycin  1,000 mg Intravenous Q12H   Continuous Infusions: . sodium chloride 75 mL/hr at 09/29/14 0833     Wendal Wilkie, DO  [facility] Pager [phone]  If 7PM-7AM, please contact night-coverage [email or web address] Password [email or web address] 09/30/2014, 3:52 PM   LOS: 1 day

## 2014-09-30 NOTE — Progress Notes (Addendum)
Physical Therapy Wound Treatment Patient Details  Name: Monique [name] MRN: [id number] Date of Birth: 1975/07/14  Today's Date: 09/30/2014 Time: 1021-1057 Time Calculation (min): 36 min  Subjective  Subjective: Pt asking if it is going to hurt. Patient and Family Stated Goals: Get better Date of Onset:  (1 year ago) Prior Treatments: antibiotic beads, I&D  Pain Score: Pain Score: 8   Wound Assessment  Wound / Incision (Open or Dehisced) 09/30/14 Incision - Dehisced Ankle Right;Medial (Active)  Dressing Type Gauze (Comment) 09/30/2014 11:00 AM  Dressing Changed Changed 09/30/2014 11:00 AM  Dressing Status Clean;Dry;Intact 09/30/2014 11:00 AM  Dressing Change Frequency Daily 09/30/2014 11:00 AM  Site / Wound Assessment Pink;Red;Yellow 09/30/2014 11:00 AM  % Wound base Red or Granulating 20% 09/30/2014 11:00 AM  % Wound base Yellow 80% 09/30/2014 11:00 AM  % Wound base Black 0% 09/30/2014 11:00 AM  % Wound base Other (Comment) 0% 09/30/2014 11:00 AM  Peri-wound Assessment Intact 09/30/2014 11:00 AM  Wound Length (cm) 3 cm 09/30/2014 11:00 AM  Wound Width (cm) 0.5 cm 09/30/2014 11:00 AM  Wound Depth (cm) 0.2 cm 09/30/2014 11:00 AM  Margins Unattached edges (unapproximated) 09/30/2014 11:00 AM  Closure None 09/30/2014 11:00 AM  Drainage Amount Moderate 09/30/2014 11:00 AM  Drainage Description Serosanguineous 09/30/2014 11:00 AM  Treatment Hydrotherapy (Pulse lavage) 09/30/2014 11:00 AM   Santyl applied to wound bed prior to applying dressing.   Wound / Incision (Open or Dehisced) 09/30/14 Incision - Dehisced Ankle Right;Anterior (Active)  Dressing Type Gauze (Comment) 09/30/2014 11:00 AM  Dressing Changed Changed 09/30/2014 11:00 AM  Dressing Status Clean;Dry;Intact 09/30/2014 11:00 AM  Dressing Change Frequency Daily 09/30/2014 11:00 AM  Site / Wound Assessment Pink;Red;Yellow 09/30/2014 11:00 AM  % Wound base Red or Granulating 50% 09/30/2014 11:00 AM  % Wound base Yellow 50% 09/30/2014 11:00 AM  % Wound base Black 0%  09/30/2014 11:00 AM  % Wound base Other (Comment) 0% 09/30/2014 11:00 AM  Peri-wound Assessment Intact 09/30/2014 11:00 AM  Wound Length (cm) 3.5 cm 09/30/2014 11:00 AM  Wound Width (cm) 0.8 cm 09/30/2014 11:00 AM  Wound Depth (cm) 0.2 cm 09/30/2014 11:00 AM  Margins Unattached edges (unapproximated) 09/30/2014 11:00 AM  Closure None 09/30/2014 11:00 AM  Drainage Amount Moderate 09/30/2014 11:00 AM  Drainage Description Serosanguineous 09/30/2014 11:00 AM  Treatment Hydrotherapy (Pulse lavage) 09/30/2014 11:00 AM   Santyl applied to wound bed prior to applying dressing.   Wound / Incision (Open or Dehisced) 09/30/14 Incision - Dehisced Ankle Right;Lateral (Active)  Dressing Type Gauze (Comment) 09/30/2014 11:00 AM  Dressing Changed Changed 09/30/2014 11:00 AM  Dressing Status Clean;Dry;Intact 09/30/2014 11:00 AM  Dressing Change Frequency Daily 09/30/2014 11:00 AM  Site / Wound Assessment Pink;Red;Yellow 09/30/2014 11:00 AM  % Wound base Red or Granulating 30% 09/30/2014 11:00 AM  % Wound base Yellow 70% 09/30/2014 11:00 AM  % Wound base Black 0% 09/30/2014 11:00 AM  % Wound base Other (Comment) 0% 09/30/2014 11:00 AM  Peri-wound Assessment Intact 09/30/2014 11:00 AM  Wound Length (cm) 5.5 cm 09/30/2014 11:00 AM  Wound Width (cm) 0.7 cm 09/30/2014 11:00 AM  Wound Depth (cm) 0.2 cm 09/30/2014 11:00 AM  Margins Attached edges (approximated) 09/30/2014 11:00 AM  Closure Sutures 09/30/2014 11:00 AM  Drainage Amount Moderate 09/30/2014 11:00 AM  Drainage Description Serosanguineous 09/30/2014 11:00 AM  Treatment Hydrotherapy (Pulse lavage) 09/30/2014 11:00 AM  Santyl applied to wound bed prior to applying dressing. Removed 2 remaining sutures.  Hydrotherapy Pulsed lavage therapy -  wound location: rt ankle Pulsed Lavage with Suction (psi): 4 psi (4-8) Pulsed Lavage with Suction - Normal Saline Used: 1000 mL Pulsed Lavage Tip: Tip with splash shield   Wound Assessment and Plan  Wound Therapy - Assess/Plan/Recommendations Wound Therapy -  Clinical Statement: Pt presents with open wounds after infected hardware removed. Can benefit from hydrotherapy to cleanse wounds and promote wound healing. Wound Therapy - Functional Problem List: Decr use of rt ankle Factors Delaying/Impairing Wound Healing: Infection - systemic/local Hydrotherapy Plan: Debridement;Dressing change;Patient/family education;Pulsatile lavage with suction Wound Therapy - Frequency: 6X / week Wound Therapy - Follow Up Recommendations: Other (comment) (dressing change at home) Wound Plan: See above  Wound Therapy Goals- Improve the function of patient's integumentary system by progressing the wound(s) through the phases of wound healing (inflammation - proliferation - remodeling) by: Decrease Necrotic Tissue to: 10 Decrease Necrotic Tissue - Progress: Goal set today Increase Granulation Tissue to: 90 Increase Granulation Tissue - Progress: Goal set today Goals/treatment plan/discharge plan were made with and agreed upon by patient/family: Yes Time For Goal Achievement: 7 days Wound Therapy - Potential for Goals: Good  Goals will be updated until maximal potential achieved or discharge criteria met.  Discharge criteria: when goals achieved, discharge from hospital, MD decision/surgical intervention, no progress towards goals, refusal/missing three consecutive treatments without notification or medical reason.  GP     Nazanin Kinner 09/30/2014, 11:19 AM  Suanne Marker PT [phone]

## 2014-09-30 NOTE — Evaluation (Signed)
Occupational Therapy Evaluation Patient Details Name: Monique [name] MRN: [id number] DOB: [birth year] Today's Date: 09/30/2014    History of Present Illness 39 y.o. female admitted with Osteomyelitis of right ankle and wound infection. Hx of infected R ORIF, removal of hardware, of bipolar, tobacco abuse, polysubstance abuse, depression, anxiety.   Clinical Impression    Patient evaluated by Occupational Therapy with no further acute OT needs identified. All education has been completed and the patient has no further questions. Pt reports that she is at baseline for self care and functional mobility. Pt is able to maintain NWB status in R LE during ambulation, transfers, and LB clothing management and hygiene after toileting. Pt with no further needs or concerns this session.  OT is signing off. Thank you for this referral.   Follow Up Recommendations  Supervision/Assistance - 24 hour    Equipment Recommendations  None recommended by OT    Recommendations for Other Services  (none)     Precautions / Restrictions Precautions Precautions: Fall Restrictions Weight Bearing Restrictions: No RLE Weight Bearing: Non weight bearing Other Position/Activity Restrictions: No orders in chart regarding weight bearing status. Pt has been NWB on R LE since previous admission in July.       Mobility Bed Mobility Overal bed mobility: Modified Independent             General bed mobility comments: extra time  Transfers Overall transfer level: Needs assistance Equipment used: Rolling walker (2 wheeled) Transfers: Sit to/from Stand Sit to Stand: Supervision         General transfer comment: maintains weight bearing restrictions during transfer. Pt picking up RW vs rolling and pt unable to change this pattern with verbal cues from therapist. Pt reports, "This is the only way I am gonna' do this."    Balance Overall balance assessment: Needs assistance Sitting-balance support: No  upper extremity supported;Feet supported Sitting balance-Leahy Scale: Normal     Standing balance support: No upper extremity supported;During functional activity Standing balance-Leahy Scale: Fair Standing balance comment: Pt standing at sink to wash hands with close supervision.              ADL Overall ADL's : At baseline          General ADL Comments: Pt able to don B socks this session with mod I. Toileting and toilet transfer with supervision this session for safety with use of RW and pt maintaining NWB status. Pt reports , "I have been doing all this at home with help if i need it." Pt has had weight bearing restirictions and use of RW prior to this admission.      Vision Vision Assessment?: No apparent visual deficits          Pertinent Vitals/Pain Pain Assessment: 0-10 Pain Score: 8  Pain Location: R ankle Pain Descriptors / Indicators: Aching;Sore Pain Intervention(s): Repositioned;Monitored during session     Hand Dominance Right   Extremity/Trunk Assessment Upper Extremity Assessment Upper Extremity Assessment: Overall WFL for tasks assessed   Lower Extremity Assessment Lower Extremity Assessment: Defer to PT evaluation   Cervical / Trunk Assessment Cervical / Trunk Assessment: Normal   Communication Communication Communication: No difficulties   Cognition Arousal/Alertness: Awake/alert Behavior During Therapy: WFL for tasks assessed/performed Overall Cognitive Status: Within Functional Limits for tasks assessed                  Home Living Family/patient expects to be discharged to:: Private residence Living Arrangements: Other relatives Available  Help at Discharge: Family;Available PRN/intermittently Type of Home: House Home Access: Level entry     Home Layout: One level     Bathroom Shower/Tub: Tub/shower unit;Curtain Shower/tub characteristics: Engineer, building services: Standard     Home Equipment: Environmental consultant - 2 wheels    Additional Comments: Pt reports she sits inside of tub for bathing or performs sink bath. Family/friends assist with getting her into and out of tub as needed per pt report. Pt not interested in shower equipment at this time.       Prior Functioning/Environment Level of Independence: Independent with assistive device(s)        Comments: has been using a RW since july    OT Diagnosis: Generalized weakness;Acute pain   OT Problem List: Impaired balance (sitting and/or standing);Decreased safety awareness;Decreased activity tolerance   OT Treatment/Interventions:      OT Goals(Current goals can be found in the care plan section) Acute Rehab OT Goals Patient Stated Goal: "go home" and "walk better" OT Goal Formulation:  (OT assessment complete, D/C therapy)  OT Frequency:     Barriers to D/C:               End of Session Equipment Utilized During Treatment: Rolling walker Nurse Communication: Other (comment) (RN present in room and notified of OT signing off)  Activity Tolerance: Patient tolerated treatment well Patient left: in bed;with call bell/phone within reach;Other (comment) (RN present in room.)   Time: [phone] OT Time Calculation (min): 14 min Charges:  OT General Charges $OT Visit: 1 Procedure OT Evaluation $Initial OT Evaluation Tier I: 1 Procedure  Lowella Grip, MS, OTR/L 09/30/2014, 11:41 AM

## 2014-10-01 DIAGNOSIS — M009 Pyogenic arthritis, unspecified: Secondary | ICD-10-CM

## 2014-10-01 LAB — C-REACTIVE PROTEIN: CRP: 0.6 mg/dL (ref <1.0)

## 2014-10-01 LAB — SEDIMENTATION RATE: SED RATE: 27 mm/h — AB (ref 0–22)

## 2014-10-01 MED ORDER — SODIUM CHLORIDE 0.9 % IJ SOLN: 10.0000 mL | INTRAMUSCULAR | Status: DC | PRN

## 2014-10-01 MED ORDER — DOCUSATE SODIUM 100 MG PO CAPS
100.0000 mg | ORAL_CAPSULE | Freq: Every day | ORAL | Status: DC | PRN
  Administered 2014-10-01 – 2014-10-02 (×2): 100 mg via ORAL
  Filled 2014-10-01 (×2): qty 1

## 2014-10-01 MED ORDER — DEXTROSE 5 % IV SOLN
2.0000 g | INTRAVENOUS | Status: DC
  Administered 2014-10-01 – 2014-10-03 (×3): 2 g via INTRAVENOUS
  Filled 2014-10-01 (×4): qty 2

## 2014-10-01 NOTE — Progress Notes (Signed)
Anticipate discharge to SNF on or around  10/04/14, CSW following. PICC line has been ordered as she will require 5-6 weeks (total) antibiotic therapy. MD confirmed this plan with pt on 8/5 and she is agreeable. Will continue to follow.

## 2014-10-01 NOTE — Progress Notes (Signed)
Physical Therapy Wound Treatment Patient Details  Name: Monique [name] MRN: [id number] Date of Birth: 04-07-1975  Today's Date: 10/01/2014 Time: 0910-0929 Time Calculation (min): 19 min  Subjective  Subjective: I think this will feel better now that the sutures are out Patient and Family Stated Goals: Get better Date of Onset:  (1 year ago) Prior Treatments: antibiotic beads, I&D  Pain Score: Pain Score: 8   Wound Assessment  Wound / Incision (Open or Dehisced) 09/30/14 Incision - Dehisced Ankle Right;Medial (Active)  Dressing Type Gauze (Comment) 10/01/2014  9:26 AM  Dressing Changed Changed 10/01/2014  9:26 AM  Dressing Status Clean;Dry;Intact 10/01/2014  9:26 AM  Dressing Change Frequency Daily 10/01/2014  9:26 AM  Site / Wound Assessment Pink;Red;Yellow 10/01/2014  9:26 AM  % Wound base Red or Granulating 20% 10/01/2014  9:26 AM  % Wound base Yellow 80% 10/01/2014  9:26 AM  % Wound base Black 0% 10/01/2014  9:26 AM  % Wound base Other (Comment) 0% 10/01/2014  9:26 AM  Peri-wound Assessment Intact 10/01/2014  9:26 AM  Wound Length (cm) 3 cm 09/30/2014 11:00 AM  Wound Width (cm) 0.5 cm 09/30/2014 11:00 AM  Wound Depth (cm) 0.2 cm 09/30/2014 11:00 AM  Margins Unattached edges (unapproximated) 10/01/2014  9:26 AM  Closure None 10/01/2014  9:26 AM  Drainage Amount Minimal 10/01/2014  9:26 AM  Drainage Description Serosanguineous 10/01/2014  9:26 AM  Treatment Hydrotherapy (Pulse lavage) 10/01/2014  9:26 AM     Wound / Incision (Open or Dehisced) 09/30/14 Incision - Dehisced Ankle Right;Anterior (Active)  Dressing Type Gauze (Comment) 10/01/2014  9:26 AM  Dressing Changed Changed 10/01/2014  9:26 AM  Dressing Status Clean;Dry;Intact 10/01/2014  9:26 AM  Dressing Change Frequency Daily 10/01/2014  9:26 AM  Site / Wound Assessment Pink;Red;Yellow 10/01/2014  9:26 AM  % Wound base Red or Granulating 85% 10/01/2014  9:26 AM  % Wound base Yellow 15% 10/01/2014  9:26 AM  % Wound base Black 0% 10/01/2014  9:26 AM  % Wound base  Other (Comment) 0% 10/01/2014  9:26 AM  Peri-wound Assessment Intact 10/01/2014  9:26 AM  Wound Length (cm) 3.5 cm 09/30/2014 11:00 AM  Wound Width (cm) 0.8 cm 09/30/2014 11:00 AM  Wound Depth (cm) 0.2 cm 09/30/2014 11:00 AM  Margins Unattached edges (unapproximated) 10/01/2014  9:26 AM  Closure None 10/01/2014  9:26 AM  Drainage Amount Minimal 10/01/2014  9:26 AM  Drainage Description Serosanguineous 10/01/2014  9:26 AM  Treatment Hydrotherapy (Pulse lavage) 10/01/2014  9:26 AM     Wound / Incision (Open or Dehisced) 09/30/14 Incision - Dehisced Ankle Right;Lateral (Active)  Dressing Type Gauze (Comment) 10/01/2014  9:26 AM  Dressing Changed Changed 10/01/2014  9:26 AM  Dressing Status Clean;Dry;Intact 10/01/2014  9:26 AM  Dressing Change Frequency Daily 10/01/2014  9:26 AM  Site / Wound Assessment Pink;Red;Yellow 10/01/2014  9:26 AM  % Wound base Red or Granulating 40% 10/01/2014  9:26 AM  % Wound base Yellow 60% 10/01/2014  9:26 AM  % Wound base Black 0% 10/01/2014  9:26 AM  % Wound base Other (Comment) 0% 10/01/2014  9:26 AM  Peri-wound Assessment Intact 10/01/2014  9:26 AM  Wound Length (cm) 5.5 cm 09/30/2014 11:00 AM  Wound Width (cm) 0.7 cm 09/30/2014 11:00 AM  Wound Depth (cm) 0.2 cm 09/30/2014 11:00 AM  Margins Attached edges (approximated) 10/01/2014  9:26 AM  Closure None 10/01/2014  9:26 AM  Drainage Amount Minimal 10/01/2014  9:26 AM  Drainage Description Serosanguineous 10/01/2014  9:26 AM  Treatment Hydrotherapy (Pulse lavage) 10/01/2014  9:26 AM  Santyl applied to all wounds  Hydrotherapy Pulsed lavage therapy - wound location: rt ankle Pulsed Lavage with Suction (psi): 4 psi (4-8) Pulsed Lavage with Suction - Normal Saline Used: 1000 mL Pulsed Lavage Tip: Tip with splash shield   Wound Assessment and Plan  Wound Therapy - Assess/Plan/Recommendations Wound Therapy - Clinical Statement: Wounds slowly progressing Wound Therapy - Functional Problem List: Decr use of rt ankle Factors Delaying/Impairing Wound Healing:  Infection - systemic/local Hydrotherapy Plan: Debridement;Dressing change;Patient/family education;Pulsatile lavage with suction Wound Therapy - Frequency: 6X / week Wound Therapy - Follow Up Recommendations: Other (comment) (dressing change at home) Wound Plan: See above  Wound Therapy Goals- Improve the function of patient's integumentary system by progressing the wound(s) through the phases of wound healing (inflammation - proliferation - remodeling) by: Decrease Necrotic Tissue to: 10 Decrease Necrotic Tissue - Progress: Progressing toward goal Increase Granulation Tissue to: 90 Increase Granulation Tissue - Progress: Progressing toward goal Goals/treatment plan/discharge plan were made with and agreed upon by patient/family: Yes Time For Goal Achievement: 7 days Wound Therapy - Potential for Goals: Good  Goals will be updated until maximal potential achieved or discharge criteria met.  Discharge criteria: when goals achieved, discharge from hospital, MD decision/surgical intervention, no progress towards goals, refusal/missing three consecutive treatments without notification or medical reason.  GP     [name] 10/01/2014, 9:36 AM   Elayne Snare, [place]

## 2014-10-01 NOTE — Progress Notes (Signed)
PROGRESS NOTE  JESSIECA RHEM SXQ:[id number] DOB: [birth year] DOA: 09/28/2014 PCP: Default, Provider, MD   Brief history 39 year old female with history of bipolar disorder, polysubstance abuse, tobacco use, anxiety, depression presented with increasing right ankle swelling and pain. 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 The patient had removal of her hardware on 09/02/2014 performed by Dr. Sharol Given. The patient was treated with vancomycin and ceftriaxone during her hospitalization. Because of her history of substance abuse, the patient was discharged with oral trimethoprim sulfamethoxazole for 12 weeks. Unfortunately, the patient never got the prescription filled. As result, she had progressive pain and swelling in her right ankle. Assessment/Plan: Septic arthritis/right ankle osteomyelitis--tibiotalar joint -Noncompliant with antibiotics  -Continue vancomycin -Discontinue Zosyn, start ceftriaxone -ESR 22,  -CRP 0.6 -I had a frank discussion with the patient regarding her substance abuse history and the only way she can receive intravenous antibiotics would be for her to go to a skilled nursing facility  -She expressed understanding and is agreeable to go to a skilled nursing facility to finish 6 weeks of intravenous antibiotics -The patient is agreeable to skilled nursing facility, PICC line will be placed and the patient will be discharged with antibiotics for 6 additional weeks -Surgical cultures on 09/02/2014 during hardware removal were negative.  -appreciate Dr. Sharol Given consult-->hydrotherapy Polysubstance abuse -Including cocaine, THC, tobacco -Tobacco cessation discussed -NicoDerm patch -HIV--neg on 09/01/14 Left eye conjunctivitis -Improving with Ciloxan Hypokalemia -Repleted Noncompliance with medical therapy -will need to go to SNF for IV abx if pt is  agreeable   Family Communication: Pt at beside Disposition Plan: SNF in 2-3 days once there is an accepting facility   Procedures/Studies: Dg Ankle Complete Right  09/29/2014   CLINICAL DATA:  Pain and swelling.  Recent surgery.  EXAM: RIGHT ANKLE - COMPLETE 3+ VIEW  COMPARISON:  MRI 08/30/2014, radiographs 08/30/2014  FINDINGS: The distal fibular plate screw hardware has been removed. The inter osseous screws and medial malleolar screws have also been removed. A single screw remains at the lateral aspect of the distal fibula. The screw tracts remain clearly visible. There is no acute fracture. There is mild ossification of the interosseous ligament. No frank bony destruction. Mortise is slightly widened laterally. Talar dome appears intact.  IMPRESSION: Hardware removal. No frank bony destruction. No fracture or dislocation.   Electronically Signed   By: Andreas Newport M.D.   On: 09/29/2014 00:24         Subjective: Patient continues to complain of right ankle pain but better than it was prior to admission. She denies any fevers, chills, chest pain, shortness breath, nausea, vomiting, diarrhea, abdominal pain, dysuria, hematuria. No rashes. No headaches or visual disturbance.  Objective: Filed Vitals:   10/01/14 0120 10/01/14 0548 10/01/14 0940 10/01/14 1416  BP: 107/53 116/56 110/59 107/59  Pulse: 50 62 58 63  Temp: 98 F (36.7 C) 98 F (36.7 C) 98.2 F (36.8 C) 98.2 F (36.8 C)  TempSrc: Oral Oral Oral Oral  Resp: _0 Height:      Weight:      SpO2: 98% 98% 99% 95%   No intake or output data in the 24 hours ending 10/01/14 1626 Weight change:  Exam:   General:  Pt is alert, follows commands appropriately, not in acute distress  HEENT: No icterus, No thrush, No neck mass, /AT  Cardiovascular: RRR, S1/S2, no rubs, no gallops  Respiratory: CTA bilaterally, no wheezing, no crackles, no rhonchi  Abdomen: Soft/+BS, non tender, non distended, no  guarding  Extremities: No edema, No lymphangitis, No petechiae, No rashes, no synovitis; right ankle edema without any lymphangitis or necrosis.  Data Reviewed: Basic Metabolic Panel:  Recent Labs Lab 09/28/14 2034 09/29/14 0400 09/30/14 0445  NA 140 140 137  K 3.7 3.3* 3.9  CL 110 109 107  CO2 _0 GLUCOSE 132* 128* 115*  BUN _1 CREATININE 0.68 0.66 0.81  CALCIUM 8.8* 8.5* 8.6*   Liver Function Tests:  Recent Labs Lab 09/28/14 2034  AST 21  ALT 15  ALKPHOS 53  BILITOT 0.1*  PROT 6.6  ALBUMIN 3.3*   No results for input(s): LIPASE, AMYLASE in the last 168 hours. No results for input(s): AMMONIA in the last 168 hours. CBC:  Recent Labs Lab 09/28/14 2034 09/29/14 0400 09/30/14 0445  WBC 5.2 6.1 5.1  NEUTROABS 2.3  --   --   HGB 9.8* 9.1* 9.9*  HCT 33.3* 30.6* 33.5*  MCV 82.8 83.2 84.2  PLT 287 250 257   Cardiac Enzymes: No results for input(s): CKTOTAL, CKMB, CKMBINDEX, TROPONINI in the last 168 hours. BNP: Invalid input(s): POCBNP CBG: No results for input(s): GLUCAP in the last 168 hours.  Recent Results (from the past 240 hour(s))  Culture, blood (x 2)     Status: None (Preliminary result)   Collection Time: 09/29/14 12:30 AM  Result Value Ref Range Status   Specimen Description BLOOD LEFT ARM  Final   Special Requests BOTTLES DRAWN AEROBIC AND ANAEROBIC 5ML  Final   Culture NO GROWTH 2 DAYS  Final   Report Status PENDING  Incomplete  MRSA PCR Screening     Status: None   Collection Time: 09/29/14  2:59 AM  Result Value Ref Range Status   MRSA by PCR NEGATIVE NEGATIVE Final    Comment:        The GeneXpert MRSA Assay (FDA approved for NASAL specimens only), is one component of a comprehensive MRSA colonization surveillance program. It is not intended to diagnose MRSA infection nor to guide or monitor treatment for MRSA infections.   Culture, blood (x 2)     Status: None (Preliminary result)   Collection Time: 09/29/14  4:00  AM  Result Value Ref Range Status   Specimen Description BLOOD RIGHT ANTECUBITAL  Final   Special Requests BOTTLES DRAWN AEROBIC ONLY 5CC  Final   Culture NO GROWTH 2 DAYS  Final   Report Status PENDING  Incomplete     Scheduled Meds: . ciprofloxacin  1 drop Left Eye Q2H while awake  . collagenase   Topical Daily  . heparin  5,000 Units Subcutaneous 3 times per day  . ketotifen  1 drop Left Eye BID  . nicotine  21 mg Transdermal Daily  . piperacillin-tazobactam (ZOSYN)  IV  3.375 g Intravenous Q8H  . vancomycin  1,000 mg Intravenous Q12H   Continuous Infusions: . sodium chloride 75 mL/hr at 09/30/14 2041     Marzelle Rutten, DO  [facility] Pager [phone]  If 7PM-7AM, please contact night-coverage [email or web address] Password [email or web address] 10/01/2014, 4:26 PM   LOS: 2 days

## 2014-10-01 NOTE — Progress Notes (Signed)
Peripherally Inserted Central Catheter/Midline Placement  The IV Nurse has discussed with the patient and/or persons authorized to consent for the patient, the purpose of this procedure and the potential benefits and risks involved with this procedure.  The benefits include less needle sticks, lab draws from the catheter and patient may be discharged home with the catheter.  Risks include, but not limited to, infection, bleeding, blood clot (thrombus formation), and puncture of an artery; nerve damage and irregular heat beat.  Alternatives to this procedure were also discussed.  PICC/Midline Placement Documentation        Timmothy Sours 10/01/2014, 3:52 PM

## 2014-10-02 LAB — BASIC METABOLIC PANEL
ANION GAP: 8 (ref 5–15)
BUN: 15 mg/dL (ref 6–20)
CHLORIDE: 99 mmol/L — AB (ref 101–111)
CO2: 28 mmol/L (ref 22–32)
CREATININE: 0.8 mg/dL (ref 0.44–1.00)
Calcium: 8.5 mg/dL — ABNORMAL LOW (ref 8.9–10.3)
GFR calc Af Amer: >60 mL/min (ref >60)
GFR calc non Af Amer: >60 mL/min (ref >60)
Glucose, Bld: 107 mg/dL — ABNORMAL HIGH (ref 65–99)
Potassium: 4.1 mmol/L (ref 3.5–5.1)
SODIUM: 135 mmol/L (ref 135–145)

## 2014-10-02 LAB — CBC
HCT: 32.8 % — ABNORMAL LOW (ref 36.0–46.0)
Hemoglobin: 9.6 g/dL — ABNORMAL LOW (ref 12.0–15.0)
MCH: 24.4 pg — ABNORMAL LOW (ref 26.0–34.0)
MCHC: 29.3 g/dL — ABNORMAL LOW (ref 30.0–36.0)
MCV: 83.2 fL (ref 78.0–100.0)
Platelets: 270 10*3/uL (ref 150–400)
RBC: 3.94 MIL/uL (ref 3.87–5.11)
RDW: 15.5 % (ref 11.5–15.5)
WBC: 3.9 10*3/uL — ABNORMAL LOW (ref 4.0–10.5)

## 2014-10-02 LAB — VANCOMYCIN, TROUGH: Vancomycin Tr: 7 ug/mL — ABNORMAL LOW (ref 10.0–20.0)

## 2014-10-02 MED ORDER — VANCOMYCIN HCL IN DEXTROSE 1-5 GM/200ML-% IV SOLN
1000.0000 mg | Freq: Three times a day (TID) | INTRAVENOUS | Status: DC
  Administered 2014-10-02 – 2014-10-04 (×7): 1000 mg via INTRAVENOUS
  Filled 2014-10-02 (×9): qty 200

## 2014-10-02 NOTE — Progress Notes (Signed)
PROGRESS NOTE  Monique [name] ZES:[id number] DOB: 27-Dec-1975 DOA: 09/28/2014 PCP: Default, Provider, MD   Brief history 39 year old female with history of bipolar disorder, polysubstance abuse, tobacco use, anxiety, depression presented with increasing right ankle swelling and pain. 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 The patient had removal of her hardware on 09/02/2014 performed by Dr. Sharol Given. The patient was treated with vancomycin and ceftriaxone during her hospitalization. Because of her history of substance abuse, the patient was discharged with oral trimethoprim sulfamethoxazole for 12 weeks. Unfortunately, the patient never got the prescription filled. As result, she had progressive pain and swelling in her right ankle. Assessment/Plan: Septic arthritis/right ankle osteomyelitis--tibiotalar joint -Noncompliant with antibiotics  -Continue vancomycin -Discontinue Zosyn, start ceftriaxone -plan 42 days of IV abx with start date of 09/28/14 -ESR 22,  -CRP 0.6 -I had a frank discussion with the patient regarding her substance abuse history and the only way she can receive intravenous antibiotics would be for her to go to a skilled nursing facility  -She expressed understanding and is agreeable to go to a skilled nursing facility to finish 6 weeks of intravenous antibiotics -The patient is agreeable to skilled nursing facility, PICC line will be placed and the patient will be discharged with antibiotics for 6 additional weeks -10/01/14--PICC placed -Surgical cultures on 09/02/2014 during hardware removal were negative.  -appreciate Dr. Sharol Given consult-->hydrotherapy Polysubstance abuse -Including cocaine, THC, tobacco -Tobacco cessation discussed -NicoDerm patch -HIV--neg on 09/01/14 Left eye conjunctivitis -Improving with  Ciloxan Hypokalemia -Repleted Noncompliance with medical therapy -will need to go to SNF for IV abx when there is accepting facility   Family Communication: Pt at beside Disposition Plan: SNF in 2-3 days once there is an accepting facility   Procedures/Studies: Dg Ankle Complete Right  09/29/2014   CLINICAL DATA:  Pain and swelling.  Recent surgery.  EXAM: RIGHT ANKLE - COMPLETE 3+ VIEW  COMPARISON:  MRI 08/30/2014, radiographs 08/30/2014  FINDINGS: The distal fibular plate screw hardware has been removed. The inter osseous screws and medial malleolar screws have also been removed. A single screw remains at the lateral aspect of the distal fibula. The screw tracts remain clearly visible. There is no acute fracture. There is mild ossification of the interosseous ligament. No frank bony destruction. Mortise is slightly widened laterally. Talar dome appears intact.  IMPRESSION: Hardware removal. No frank bony destruction. No fracture or dislocation.   Electronically Signed   By: Andreas Newport M.D.   On: 09/29/2014 00:24         Subjective: Patient denies fevers, chills, headache, chest pain, dyspnea, nausea, vomiting, diarrhea, abdominal pain, dysuria, hematuria. Patient states that right ankle is feeling better.   Objective: Filed Vitals:   10/01/14 2157 10/02/14 0248 10/02/14 0635 10/02/14 0957  BP: 119/50 108/54 112/70 115/61  Pulse: 64 65 62 63  Temp: 98.5 F (36.9 C) 98.2 F (36.8 C) 98.2 F (36.8 C) 98.7 F (37.1 C)  TempSrc: Oral Oral Oral Oral  Resp: '18 18 18 18  ' Height:      Weight:      SpO2: 98% 96% 99% 99%   No intake or output data in the 24 hours ending 10/02/14 1404 Weight change:  Exam:   General:  Pt is alert, follows commands appropriately, not in acute distress  HEENT: No icterus, No thrush, No neck mass, [place]/AT  Cardiovascular: RRR, S1/S2, no rubs, no gallops  Respiratory: CTA bilaterally, no wheezing, no crackles, no rhonchi  Abdomen:  Soft/+BS, non tender, non distended, no guarding  Extremities: No edema, No lymphangitis, No petechiae, No rashes, no synovitis; right ankle wound with small amount of serous discharge without any crepitance or necrosis.  Data Reviewed: Basic Metabolic Panel:  Recent Labs Lab 09/28/14 2034 09/29/14 0400 09/30/14 0445 10/02/14 0500  NA 140 140 137 135  K 3.7 3.3* 3.9 4.1  CL 110 109 107 99*  CO2 '22 24 26 28  ' GLUCOSE 132* 128* 115* 107*  BUN '10 10 14 15  ' CREATININE 0.68 0.66 0.81 0.80  CALCIUM 8.8* 8.5* 8.6* 8.5*   Liver Function Tests:  Recent Labs Lab 09/28/14 2034  AST 21  ALT 15  ALKPHOS 53  BILITOT 0.1*  PROT 6.6  ALBUMIN 3.3*   No results for input(s): LIPASE, AMYLASE in the last 168 hours. No results for input(s): AMMONIA in the last 168 hours. CBC:  Recent Labs Lab 09/28/14 2034 09/29/14 0400 09/30/14 0445 10/02/14 0500  WBC 5.2 6.1 5.1 3.9*  NEUTROABS 2.3  --   --   --   HGB 9.8* 9.1* 9.9* 9.6*  HCT 33.3* 30.6* 33.5* 32.8*  MCV 82.8 83.2 84.2 83.2  PLT 287 250 257 270   Cardiac Enzymes: No results for input(s): CKTOTAL, CKMB, CKMBINDEX, TROPONINI in the last 168 hours. BNP: Invalid input(s): POCBNP CBG: No results for input(s): GLUCAP in the last 168 hours.  Recent Results (from the past 240 hour(s))  Culture, blood (x 2)     Status: None (Preliminary result)   Collection Time: 09/29/14 12:30 AM  Result Value Ref Range Status   Specimen Description BLOOD LEFT ARM  Final   Special Requests BOTTLES DRAWN AEROBIC AND ANAEROBIC 5ML  Final   Culture NO GROWTH 3 DAYS  Final   Report Status PENDING  Incomplete  MRSA PCR Screening     Status: None   Collection Time: 09/29/14  2:59 AM  Result Value Ref Range Status   MRSA by PCR NEGATIVE NEGATIVE Final    Comment:        The GeneXpert MRSA Assay (FDA approved for NASAL specimens only), is one component of a comprehensive MRSA colonization surveillance program. It is not intended to diagnose  MRSA infection nor to guide or monitor treatment for MRSA infections.   Culture, blood (x 2)     Status: None (Preliminary result)   Collection Time: 09/29/14  4:00 AM  Result Value Ref Range Status   Specimen Description BLOOD RIGHT ANTECUBITAL  Final   Special Requests BOTTLES DRAWN AEROBIC ONLY 5CC  Final   Culture NO GROWTH 3 DAYS  Final   Report Status PENDING  Incomplete     Scheduled Meds: . cefTRIAXone (ROCEPHIN)  IV  2 g Intravenous Q24H  . ciprofloxacin  1 drop Left Eye Q2H while awake  . collagenase   Topical Daily  . heparin  5,000 Units Subcutaneous 3 times per day  . ketotifen  1 drop Left Eye BID  . nicotine  21 mg Transdermal Daily  . vancomycin  1,000 mg Intravenous Q8H   Continuous Infusions: . sodium chloride 75 mL/hr at 10/02/14 0007     Ayanna Gheen, DO  [facility] Pager [phone]  If 7PM-7AM, please contact night-coverage [email or web address] Password [email or web address] 10/02/2014, 2:04 PM   LOS: 3 days

## 2014-10-02 NOTE — Progress Notes (Signed)
ANTIBIOTIC CONSULT NOTE - FOLLOW UP  Pharmacy Consult for Vancomycin  Indication: Osteomyelitis  No Known Allergies  Patient Measurements: Height:  (162.6 cm) Weight: 180 lb (81.647 kg) IBW/kg (Calculated) : 54.7  Vital Signs: Temp: 98.5 F (36.9 C) (08/05 2157) Temp Source: Oral (08/05 2157) BP: 119/50 mmHg (08/05 2157) Pulse Rate: 64 (08/05 2157)  Labs:  Recent Labs  09/29/14 0400 09/30/14 0445  WBC 6.1 5.1  HGB 9.1* 9.9*  PLT 250 257  CREATININE 0.66 0.81   Estimated Creatinine Clearance: 97.4 mL/min (by C-G formula based on Cr of 0.81).  Recent Labs  10/01/14 2310  VANCOTROUGH 7*     Assessment: Sub-therapeutic vancomycin trough, drawn correctly   Goal of Therapy:  Vancomycin trough level 15-20 mcg/ml  Plan:  -Increase vancomycin to 1000 mg IV q8h -Re-check vancomycin trough at steady state  Monique [name] 10/02/2014,12:17 AM

## 2014-10-02 NOTE — Progress Notes (Signed)
Physical Therapy Wound Treatment Patient Details  Name: Monique [name] MRN: [id number] Date of Birth: 1976-01-21  Today's Date: 10/02/2014 Time: 0900-0917 Time Calculation (min): 17 min  Subjective  Subjective: I just woke up.  Patient and Family Stated Goals: Get better Date of Onset:  (1 year ago) Prior Treatments: antibiotic beads, I&D  Pain Score: Pain Score: 8   Wound Assessment  Wound / Incision (Open or Dehisced) 09/30/14 Incision - Dehisced Ankle Right;Medial (Active)  Dressing Type Gauze (Comment) 10/02/2014  9:00 AM  Dressing Changed Changed 10/02/2014  9:00 AM  Dressing Status Clean;Dry;Intact 10/02/2014  9:00 AM  Dressing Change Frequency Daily 10/02/2014  9:00 AM  Site / Wound Assessment Pink;Red;Yellow 10/02/2014  9:00 AM  % Wound base Red or Granulating 20% 10/02/2014  9:00 AM  % Wound base Yellow 80% 10/02/2014  9:00 AM  % Wound base Black 0% 10/02/2014  9:00 AM  % Wound base Other (Comment) 0% 10/02/2014  9:00 AM  Peri-wound Assessment Intact 10/02/2014  9:00 AM  Wound Length (cm) 3 cm 09/30/2014 11:00 AM  Wound Width (cm) 0.5 cm 09/30/2014 11:00 AM  Wound Depth (cm) 0.2 cm 09/30/2014 11:00 AM  Margins Unattached edges (unapproximated) 10/02/2014  9:00 AM  Closure None 10/02/2014  9:00 AM  Drainage Amount Minimal 10/02/2014  9:00 AM  Drainage Description Serosanguineous 10/02/2014  9:00 AM  Treatment Hydrotherapy (Pulse lavage) 10/02/2014  9:00 AM     Wound / Incision (Open or Dehisced) 09/30/14 Incision - Dehisced Ankle Right;Anterior (Active)  Dressing Type Gauze (Comment) 10/02/2014  9:00 AM  Dressing Changed Changed 10/02/2014  9:00 AM  Dressing Status Clean;Dry;Intact 10/02/2014  9:00 AM  Dressing Change Frequency Daily 10/02/2014  9:00 AM  Site / Wound Assessment Pink;Red;Yellow 10/02/2014  9:00 AM  % Wound base Red or Granulating 80% 10/02/2014  9:00 AM  % Wound base Yellow 20% 10/02/2014  9:00 AM  % Wound base Black 0% 10/02/2014  9:00 AM  % Wound base Other (Comment) 0% 10/02/2014  9:00 AM   Peri-wound Assessment Intact 10/02/2014  9:00 AM  Wound Length (cm) 3.5 cm 09/30/2014 11:00 AM  Wound Width (cm) 0.8 cm 09/30/2014 11:00 AM  Wound Depth (cm) 0.2 cm 09/30/2014 11:00 AM  Margins Unattached edges (unapproximated) 10/02/2014  9:00 AM  Closure None 10/02/2014  9:00 AM  Drainage Amount Minimal 10/02/2014  9:00 AM  Drainage Description Serosanguineous 10/02/2014  9:00 AM  Treatment Hydrotherapy (Pulse lavage) 10/02/2014  9:00 AM     Wound / Incision (Open or Dehisced) 09/30/14 Incision - Dehisced Ankle Right;Lateral (Active)  Dressing Type Gauze (Comment) 10/02/2014  9:00 AM  Dressing Changed Changed 10/02/2014  9:00 AM  Dressing Status Clean;Dry;Intact 10/02/2014  9:00 AM  Dressing Change Frequency Daily 10/02/2014  9:00 AM  Site / Wound Assessment Pink;Red;Yellow 10/02/2014  9:00 AM  % Wound base Red or Granulating 50% 10/02/2014  9:00 AM  % Wound base Yellow 50% 10/02/2014  9:00 AM  % Wound base Black 0% 10/02/2014  9:00 AM  % Wound base Other (Comment) 0% 10/02/2014  9:00 AM  Peri-wound Assessment Intact 10/02/2014  9:00 AM  Wound Length (cm) 5.5 cm 09/30/2014 11:00 AM  Wound Width (cm) 0.7 cm 09/30/2014 11:00 AM  Wound Depth (cm) 0.2 cm 09/30/2014 11:00 AM  Margins Attached edges (approximated) 10/02/2014  9:00 AM  Closure None 10/02/2014  9:00 AM  Drainage Amount Minimal 10/02/2014  9:00 AM  Drainage Description Serosanguineous 10/02/2014  9:00 AM  Treatment Hydrotherapy (Pulse lavage)  10/02/2014  9:00 AM   Santyl applied to all wounds.   Hydrotherapy Pulsed lavage therapy - wound location: rt ankle Pulsed Lavage with Suction (psi): 4 psi (4-8) Pulsed Lavage with Suction - Normal Saline Used: 1000 mL Pulsed Lavage Tip: Tip with splash shield   Wound Assessment and Plan  Wound Therapy - Assess/Plan/Recommendations Wound Therapy - Clinical Statement: Wounds slowly progressing, lateral wound very tender to touch but healing more quickly than anterior and medial wounds Wound Therapy - Functional Problem List:  Decreased use of rt ankle Factors Delaying/Impairing Wound Healing: Infection - systemic/local Hydrotherapy Plan: Debridement;Dressing change;Patient/family education;Pulsatile lavage with suction Wound Therapy - Frequency: 6X / week Wound Therapy - Follow Up Recommendations: Other (comment) (dressing change at home) Wound Plan: See above  Wound Therapy Goals- Improve the function of patient's integumentary system by progressing the wound(s) through the phases of wound healing (inflammation - proliferation - remodeling) by: Decrease Necrotic Tissue to: 10 Decrease Necrotic Tissue - Progress: Progressing toward goal Increase Granulation Tissue to: 90 Increase Granulation Tissue - Progress: Progressing toward goal Goals/treatment plan/discharge plan were made with and agreed upon by patient/family: Yes Time For Goal Achievement: 7 days Wound Therapy - Potential for Goals: Good  Goals will be updated until maximal potential achieved or discharge criteria met.  Discharge criteria: when goals achieved, discharge from hospital, MD decision/surgical intervention, no progress towards goals, refusal/missing three consecutive treatments without notification or medical reason.  GP     [name] 10/02/2014, 9:32 AM Elayne Snare, [place]

## 2014-10-03 MED ORDER — AMITRIPTYLINE HCL 10 MG PO TABS
10.0000 mg | ORAL_TABLET | Freq: Every day | ORAL | Status: DC
  Administered 2014-10-03: 10 mg via ORAL
  Filled 2014-10-03 (×3): qty 1

## 2014-10-03 NOTE — Progress Notes (Signed)
PROGRESS NOTE  Monique [name] UDJ:[id number] DOB: May 15, 1975 DOA: 09/28/2014 PCP: Default, Provider, MD  Brief history 39 year old female with history of bipolar disorder, polysubstance abuse, tobacco use, anxiety, depression presented with increasing right ankle swelling and pain. 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 The patient had removal of her hardware on 09/02/2014 performed by Dr. Sharol Given. The patient was treated with vancomycin and ceftriaxone during her hospitalization. Because of her history of substance abuse, the patient was discharged with oral trimethoprim sulfamethoxazole for 12 weeks. Unfortunately, the patient never got the prescription filled. As result, she had progressive pain and swelling in her right ankle. Assessment/Plan: Septic arthritis/right ankle osteomyelitis--tibiotalar joint -Noncompliant with antibiotics  -Continue vancomycin -Discontinue Zosyn, start ceftriaxone -plan 42 days of IV abx with start date of 09/28/14 -ESR 22,  -CRP 0.6 -I had a frank discussion with the patient regarding her substance abuse history and the only way she can receive intravenous antibiotics would be for her to go to a skilled nursing facility  -She expressed understanding and is agreeable to go to a skilled nursing facility to finish 6 weeks of intravenous antibiotics -PICC line will be placed and the patient will be discharged with antibiotics for 6 additional weeks -10/01/14--PICC placed -Surgical cultures on 09/02/2014 during hardware removal were negative.  -appreciate Dr. Sharol Given consult-->hydrotherapy -Amitriptyline at bedtime to help with pain Polysubstance abuse -Including cocaine, THC, tobacco -Tobacco cessation discussed -NicoDerm patch -HIV--neg on 09/01/14 Left eye conjunctivitis -Improving with Ciloxan -told pt to avoid eye make-up for the  next week Hypokalemia -Repleted Noncompliance with medical therapy -will need to go to SNF for IV abx when there is accepting facility   Family Communication: Pt at beside Disposition Plan: SNF in 1-2 days once there is an accepting facility    Procedures/Studies: Dg Ankle Complete Right  09/29/2014   CLINICAL DATA:  Pain and swelling.  Recent surgery.  EXAM: RIGHT ANKLE - COMPLETE 3+ VIEW  COMPARISON:  MRI 08/30/2014, radiographs 08/30/2014  FINDINGS: The distal fibular plate screw hardware has been removed. The inter osseous screws and medial malleolar screws have also been removed. A single screw remains at the lateral aspect of the distal fibula. The screw tracts remain clearly visible. There is no acute fracture. There is mild ossification of the interosseous ligament. No frank bony destruction. Mortise is slightly widened laterally. Talar dome appears intact.  IMPRESSION: Hardware removal. No frank bony destruction. No fracture or dislocation.   Electronically Signed   By: Andreas Newport M.D.   On: 09/29/2014 00:24         Subjective: Patient complains of pain in the right ankle but overall slightly improved. Denies any fevers, chills, chest pain, was breath, nausea, vomiting, diarrhea, abdominal pain, dysuria, hematuria. No rashes.  Objective: Filed Vitals:   10/03/14 0130 10/03/14 0606 10/03/14 1028 10/03/14 1407  BP: 105/58 128/75 104/53 105/61  Pulse: 56 60 57 68  Temp: 98.4 F (36.9 C) 98.4 F (36.9 C) 98.6 F (37 C) 97.5 F (36.4 C)  TempSrc: Oral Oral Oral Oral  Resp: _0 Height:      Weight:      SpO2: 98% 98% 98% 98%   No intake or output data in the 24 hours ending 10/03/14 1449 Weight change:  Exam:   General:  Pt is alert, follows commands appropriately, not  in acute distress  HEENT: No icterus, No thrush, No neck mass, [place]/AT  Cardiovascular: RRR, S1/S2, no rubs, no gallops  Respiratory: Bibasilar crackles without any wheezing. Good  air movement.  Abdomen: Soft/+BS, non tender, non distended, no guarding  Extremities: No edema, No lymphangitis, No petechiae, No rashes, no synovitis; right ankle edema without any crepitance or lymphangitis. Scant amount of yellow drainage.  Data Reviewed: Basic Metabolic Panel:  Recent Labs Lab 09/28/14 2034 09/29/14 0400 09/30/14 0445 10/02/14 0500  NA 140 140 137 135  K 3.7 3.3* 3.9 4.1  CL 110 109 107 99*  CO2 _0 GLUCOSE 132* 128* 115* 107*  BUN _1 CREATININE 0.68 0.66 0.81 0.80  CALCIUM 8.8* 8.5* 8.6* 8.5*   Liver Function Tests:  Recent Labs Lab 09/28/14 2034  AST 21  ALT 15  ALKPHOS 53  BILITOT 0.1*  PROT 6.6  ALBUMIN 3.3*   No results for input(s): LIPASE, AMYLASE in the last 168 hours. No results for input(s): AMMONIA in the last 168 hours. CBC:  Recent Labs Lab 09/28/14 2034 09/29/14 0400 09/30/14 0445 10/02/14 0500  WBC 5.2 6.1 5.1 3.9*  NEUTROABS 2.3  --   --   --   HGB 9.8* 9.1* 9.9* 9.6*  HCT 33.3* 30.6* 33.5* 32.8*  MCV 82.8 83.2 84.2 83.2  PLT 287 250 257 270   Cardiac Enzymes: No results for input(s): CKTOTAL, CKMB, CKMBINDEX, TROPONINI in the last 168 hours. BNP: Invalid input(s): POCBNP CBG: No results for input(s): GLUCAP in the last 168 hours.  Recent Results (from the past 240 hour(s))  Culture, blood (x 2)     Status: None (Preliminary result)   Collection Time: 09/29/14 12:30 AM  Result Value Ref Range Status   Specimen Description BLOOD LEFT ARM  Final   Special Requests BOTTLES DRAWN AEROBIC AND ANAEROBIC 5ML  Final   Culture NO GROWTH 4 DAYS  Final   Report Status PENDING  Incomplete  MRSA PCR Screening     Status: None   Collection Time: 09/29/14  2:59 AM  Result Value Ref Range Status   MRSA by PCR NEGATIVE NEGATIVE Final    Comment:        The GeneXpert MRSA Assay (FDA approved for NASAL specimens only), is one component of a comprehensive MRSA colonization surveillance program. It is  not intended to diagnose MRSA infection nor to guide or monitor treatment for MRSA infections.   Culture, blood (x 2)     Status: None (Preliminary result)   Collection Time: 09/29/14  4:00 AM  Result Value Ref Range Status   Specimen Description BLOOD RIGHT ANTECUBITAL  Final   Special Requests BOTTLES DRAWN AEROBIC ONLY 5CC  Final   Culture NO GROWTH 4 DAYS  Final   Report Status PENDING  Incomplete     Scheduled Meds: . cefTRIAXone (ROCEPHIN)  IV  2 g Intravenous Q24H  . ciprofloxacin  1 drop Left Eye Q2H while awake  . collagenase   Topical Daily  . heparin  5,000 Units Subcutaneous 3 times per day  . ketotifen  1 drop Left Eye BID  . nicotine  21 mg Transdermal Daily  . vancomycin  1,000 mg Intravenous Q8H   Continuous Infusions:    Lymon Kidney, DO  [facility] Pager [phone]  If 7PM-7AM, please contact night-coverage [email or web address] Password [email or web address] 10/03/2014, 2:49 PM   LOS: 4 days

## 2014-10-03 NOTE — Clinical Social Work Note (Signed)
Clinical Social Work Assessment  Patient Details  Name: Monique [name] MRN: [id number] Date of Birth: [birth year]  Date of referral:  10/01/14               Reason for consult:  Facility Placement                Permission sought to share information with:  Facility Industrial/product designer granted to share information::  Yes, Verbal Permission Granted  Name::        Agency::     Relationship::     Contact Information:     Housing/Transportation Living arrangements for the past 2 months:  No permanent address Source of Information:  Patient Patient Interpreter Needed:  None Criminal Activity/Legal Involvement Pertinent to Current Situation/Hospitalization:  No - Comment as needed Significant Relationships:  Other Family Members (Grandmother and minor children) Lives with:  Relatives (Grandmother) Do you feel safe going back to the place where you live?  Yes Need for family participation in patient care:  No (Coment)  Care giving concerns: Patient states that she has not been able to work since she has been dealing with her medical issues. Patient recently lost her apartment and has been living with her Grandmother. Her minor children have had to move to [place] to be cared for by their paternal grandmother. Patient will need 6 weeks of IV antibiotics. Which will need to be done through SNF.    Social Worker assessment / plan: CSW confirmed with patient that LOG placements are [place] out of county. Patient stated that she has a very strong preference for [facility] because her children are in [place]. CSW stated that he would document such but would not be able to guarantee a placement at that facility. CSW also contacted the weekend admissions coordinator at the [facility]), who stated that he would review this patient on Monday.   Employment status:  Unemployed Health and safety inspector:  Self Pay (Medicaid Pending) PT  Recommendations:  No Follow Up Information / Referral to community resources:  Skilled Nursing Facility  Patient/Family's Response to care:  Patient states that she would like some additional supports in the community. Patient is hoping to be in [place] for proximity to family (brother and minor children)  Patient/Family's Understanding of and Emotional Response to Diagnosis, Current Treatment, and Prognosis: Patient is hopeful that this IV treatment will support long term health and allow her to return to work.   Emotional Assessment Appearance:  Appears stated age Attitude/Demeanor/Rapport:   (pleasant) Affect (typically observed):  Appropriate, Restless Orientation:  Oriented to Self, Oriented to Place, Oriented to  Time, Oriented to Situation Alcohol / Substance use:  Not Applicable Psych involvement (Current and /or in the community):  No (Comment)  Discharge Needs  Concerns to be addressed:  No discharge needs identified Readmission within the last 30 days:  No Current discharge risk:  None Barriers to Discharge:  No Barriers Identified   Laderrick Wilk J, LCSW 10/03/2014, 1:28 PM

## 2014-10-03 NOTE — Clinical Social Work Placement (Signed)
   CLINICAL SOCIAL WORK PLACEMENT  NOTE  Date:  10/03/2014  Patient Details  Name: Monique [name] MRN: [id number] Date of Birth: December 23, 1975  Clinical Social Work is seeking post-discharge placement for this patient at the Skilled  Nursing Facility level of care (*CSW will initial, date and re-position this form in  chart as items are completed):  No   Patient/family provided with [facility] Clinical Social Work Department's list of facilities offering this level of care within the geographic area requested by the patient (or if unable, by the patient's family).  Yes   Patient/family informed of their freedom to choose among providers that offer the needed level of care, that participate in Medicare, Medicaid or managed care program needed by the patient, have an available bed and are willing to accept the patient.  No   Patient/family informed of [facility]'s ownership interest in [facility] and [facility], as well as of the fact that they are under no obligation to receive care at these facilities.  PASRR submitted to EDS on 10/01/14     PASRR number received on 10/01/14     Existing PASRR number confirmed on       FL2 transmitted to all facilities in geographic area requested by pt/family on 10/01/14     FL2 transmitted to all facilities within larger geographic area on 10/01/14     Patient informed that his/her managed care company has contracts with or will negotiate with certain facilities, including the following:            Patient/family informed of bed offers received.  Patient chooses bed at       Physician recommends and patient chooses bed at      Patient to be transferred to   on  .  Patient to be transferred to facility by       Patient family notified on   of transfer.  Name of family member notified:        PHYSICIAN       Additional Comment:    _______________________________________________ Beverly Sessions, LCSW 10/03/2014, 1:51  PM

## 2014-10-04 LAB — CULTURE, BLOOD (ROUTINE X 2)
CULTURE: NO GROWTH
Culture: NO GROWTH

## 2014-10-04 LAB — VANCOMYCIN, TROUGH: Vancomycin Tr: 11 ug/mL (ref 10.0–20.0)

## 2014-10-04 MED ORDER — VANCOMYCIN HCL 10 G IV SOLR
1500.0000 mg | Freq: Three times a day (TID) | INTRAVENOUS | Status: DC
  Filled 2014-10-04 (×3): qty 1500

## 2014-10-04 MED ORDER — VANCOMYCIN HCL IN DEXTROSE 1-5 GM/200ML-% IV SOLN: 1000.0000 mg | Freq: Three times a day (TID) | INTRAVENOUS | Status: AC

## 2014-10-04 MED ORDER — AMITRIPTYLINE HCL 10 MG PO TABS: 10.0000 mg | ORAL_TABLET | Freq: Every day | ORAL | Status: DC

## 2014-10-04 MED ORDER — BUPROPION HCL ER (SR) 100 MG PO TB12: 100.0000 mg | ORAL_TABLET | Freq: Two times a day (BID) | ORAL | Status: DC

## 2014-10-04 MED ORDER — BUPROPION HCL ER (SR) 100 MG PO TB12
100.0000 mg | ORAL_TABLET | Freq: Two times a day (BID) | ORAL | Status: DC
  Administered 2014-10-04: 100 mg via ORAL
  Filled 2014-10-04 (×2): qty 1

## 2014-10-04 MED ORDER — NICOTINE 21 MG/24HR TD PT24: 21.0000 mg | MEDICATED_PATCH | Freq: Every day | TRANSDERMAL | Status: DC

## 2014-10-04 MED ORDER — OXYCODONE-ACETAMINOPHEN 5-325 MG PO TABS: 1.0000 | ORAL_TABLET | ORAL | Status: DC | PRN

## 2014-10-04 MED ORDER — DEXTROSE 5 % IV SOLN: 2.0000 g | INTRAVENOUS | Status: AC

## 2014-10-04 MED ORDER — COLLAGENASE 250 UNIT/GM EX OINT: TOPICAL_OINTMENT | Freq: Every day | CUTANEOUS | Status: DC

## 2014-10-04 NOTE — Progress Notes (Signed)
Physical Therapy Wound Treatment Patient Details  Name: Monique [name] MRN: [id number] Date of Birth: 09-10-1975  Today's Date: 10/04/2014 Time: 6568-1275 Time Calculation (min): 19 min  Subjective  Subjective: Pt reports her ankle feels better after hydro. Patient and Family Stated Goals: Get better Date of Onset:  (1 year ago) Prior Treatments: antibiotic beads, I&D  Pain Score: Pain Score: Moderate  Wound Assessment  Wound / Incision (Open or Dehisced) 09/30/14 Incision - Dehisced Ankle Right;Medial (Active)  Dressing Type Gauze (Comment) 10/04/2014  9:28 AM  Dressing Changed Changed 10/04/2014  9:28 AM  Dressing Status Clean;Dry;Intact 10/04/2014  9:28 AM  Dressing Change Frequency Daily 10/04/2014  9:28 AM  Site / Wound Assessment Pink;Red;Yellow 10/04/2014  9:28 AM  % Wound base Red or Granulating 35% 10/04/2014  9:28 AM  % Wound base Yellow 65% 10/04/2014  9:28 AM  % Wound base Black 0% 10/04/2014  9:28 AM  % Wound base Other (Comment) 0% 10/04/2014  9:28 AM  Peri-wound Assessment Intact 10/04/2014  9:28 AM  Wound Length (cm) 3 cm 09/30/2014 11:00 AM  Wound Width (cm) 0.5 cm 09/30/2014 11:00 AM  Wound Depth (cm) 0.2 cm 09/30/2014 11:00 AM  Margins Unattached edges (unapproximated) 10/04/2014  9:28 AM  Closure None 10/04/2014  9:28 AM  Drainage Amount Minimal 10/04/2014  9:28 AM  Drainage Description Serosanguineous 10/04/2014  9:28 AM  Treatment Hydrotherapy (Pulse lavage) 10/04/2014  9:28 AM   Santyl applied to wound bed prior to applying dressing.   Wound / Incision (Open or Dehisced) 09/30/14 Incision - Dehisced Ankle Right;Anterior (Active)  Dressing Type Gauze (Comment) 10/04/2014  9:28 AM  Dressing Changed Changed 10/04/2014  9:28 AM  Dressing Status Clean;Dry;Intact 10/04/2014  9:28 AM  Dressing Change Frequency Daily 10/04/2014  9:28 AM  Site / Wound Assessment Pink;Red;Yellow 10/04/2014  9:28 AM  % Wound base Red or Granulating 85% 10/04/2014  9:28 AM  % Wound base Yellow 15% 10/04/2014  9:28 AM  %  Wound base Black 0% 10/04/2014  9:28 AM  % Wound base Other (Comment) 0% 10/04/2014  9:28 AM  Peri-wound Assessment Intact 10/04/2014  9:28 AM  Wound Length (cm) 3.5 cm 09/30/2014 11:00 AM  Wound Width (cm) 0.8 cm 09/30/2014 11:00 AM  Wound Depth (cm) 0.2 cm 09/30/2014 11:00 AM  Margins Unattached edges (unapproximated) 10/04/2014  9:28 AM  Closure None 10/04/2014  9:28 AM  Drainage Amount Minimal 10/04/2014  9:28 AM  Drainage Description Serosanguineous 10/04/2014  9:28 AM  Treatment Hydrotherapy (Pulse lavage) 10/04/2014  9:28 AM   Santyl applied to wound bed prior to applying dressing.   Wound / Incision (Open or Dehisced) 09/30/14 Incision - Dehisced Ankle Right;Lateral (Active)  Dressing Type Gauze (Comment) 10/04/2014  9:28 AM  Dressing Changed Changed 10/04/2014  9:28 AM  Dressing Status Clean;Dry;Intact 10/04/2014  9:28 AM  Dressing Change Frequency Daily 10/04/2014  9:28 AM  Site / Wound Assessment Pink;Red;Yellow 10/04/2014  9:28 AM  % Wound base Red or Granulating 60% 10/04/2014  9:28 AM  % Wound base Yellow 40% 10/04/2014  9:28 AM  % Wound base Black 0% 10/04/2014  9:28 AM  % Wound base Other (Comment) 0% 10/04/2014  9:28 AM  Peri-wound Assessment Intact 10/04/2014  9:28 AM  Wound Length (cm) 5.5 cm 09/30/2014 11:00 AM  Wound Width (cm) 0.7 cm 09/30/2014 11:00 AM  Wound Depth (cm) 0.2 cm 09/30/2014 11:00 AM  Margins Attached edges (approximated) 10/04/2014  9:28 AM  Closure None 10/04/2014  9:28 AM  Drainage Amount Minimal 10/04/2014  9:28 AM  Drainage Description Serosanguineous 10/04/2014  9:28 AM  Treatment Hydrotherapy (Pulse lavage) 10/04/2014  9:28 AM  Santyl applied to wound bed prior to applying dressing.  Hydrotherapy Pulsed lavage therapy - wound location: rt ankle Pulsed Lavage with Suction (psi): 4 psi (4-8) Pulsed Lavage with Suction - Normal Saline Used: 1000 mL Pulsed Lavage Tip: Tip with splash shield   Wound Assessment and Plan  Wound Therapy - Assess/Plan/Recommendations Wound Therapy - Clinical  Statement: Making steady progress. Wound Therapy - Functional Problem List: Decreased use of rt ankle Factors Delaying/Impairing Wound Healing: Infection - systemic/local Hydrotherapy Plan: Debridement;Dressing change;Patient/family education;Pulsatile lavage with suction Wound Therapy - Frequency: 6X / week Wound Therapy - Follow Up Recommendations: Skilled nursing facility (for IV antibiotics) Wound Plan: See above  Wound Therapy Goals- Improve the function of patient's integumentary system by progressing the wound(s) through the phases of wound healing (inflammation - proliferation - remodeling) by: Decrease Necrotic Tissue to: 10 Decrease Necrotic Tissue - Progress: Progressing toward goal Increase Granulation Tissue to: 90 Increase Granulation Tissue - Progress: Progressing toward goal Goals/treatment plan/discharge plan were made with and agreed upon by patient/family: Yes Time For Goal Achievement: 7 days Wound Therapy - Potential for Goals: Good  Goals will be updated until maximal potential achieved or discharge criteria met.  Discharge criteria: when goals achieved, discharge from hospital, MD decision/surgical intervention, no progress towards goals, refusal/missing three consecutive treatments without notification or medical reason.  GP     Arlayne Liggins 10/04/2014, 9:32 AM  [facility] PT [phone]

## 2014-10-04 NOTE — Clinical Social Work Note (Signed)
Clinical Social Worker facilitated patient discharge including contacting patient family and facility to confirm patient discharge plans.  Clinical information faxed to facility and family agreeable with plan.  CSW arranged ambulance transport via [facility] to [facility].  RN to call report prior to discharge.  DC packet prepared and on chart for transport with number for report.   Clinical Social Worker will sign off for now as social work intervention is no longer needed. Please consult [place] again if new need arises.  Derenda Fennel, MSW, LCSWA [phone] 10/04/2014 1:12 PM

## 2014-10-04 NOTE — Progress Notes (Signed)
ANTIBIOTIC CONSULT NOTE - FOLLOW UP  Pharmacy Consult for Vancomycin Indication: Osteomyelitis  No Known Allergies  Patient Measurements: Height:  (162.6 cm) Weight: 180 lb (81.647 kg) IBW/kg (Calculated) : 54.7  Vital Signs: Temp: 98.2 F (36.8 C) (08/08 1100) Temp Source: Oral (08/08 1100) BP: 108/51 mmHg (08/08 1100) Pulse Rate: 62 (08/08 1100) Intake/Output from previous day: 08/07 0701 - 08/08 0700 In: 480 [P.O.:480] Out: -  Intake/Output from this shift:    Labs:  Recent Labs  10/02/14 0500  WBC 3.9*  HGB 9.6*  PLT 270  CREATININE 0.80   Estimated Creatinine Clearance: 98.6 mL/min (by C-G formula based on Cr of 0.8).  Recent Labs  10/01/14 2310 10/04/14 0840  VANCOTROUGH 7* 11     Microbiology: Recent Results (from the past 720 hour(s))  Culture, blood (x 2)     Status: None (Preliminary result)   Collection Time: 09/29/14 12:30 AM  Result Value Ref Range Status   Specimen Description BLOOD LEFT ARM  Final   Special Requests BOTTLES DRAWN AEROBIC AND ANAEROBIC  Final   Culture NO GROWTH 4 DAYS  Final   Report Status PENDING  Incomplete  MRSA PCR Screening     Status: None   Collection Time: 09/29/14  2:59 AM  Result Value Ref Range Status   MRSA by PCR NEGATIVE NEGATIVE Final    Comment:        The GeneXpert MRSA Assay (FDA approved for NASAL specimens only), is one component of a comprehensive MRSA colonization surveillance program. It is not intended to diagnose MRSA infection nor to guide or monitor treatment for MRSA infections.   Culture, blood (x 2)     Status: None (Preliminary result)   Collection Time: 09/29/14  4:00 AM  Result Value Ref Range Status   Specimen Description BLOOD RIGHT ANTECUBITAL  Final   Special Requests BOTTLES DRAWN AEROBIC ONLY 5CC  Final   Culture NO GROWTH 4 DAYS  Final   Report Status PENDING  Incomplete    Anti-infectives    Start     Dose/Rate Route Frequency Ordered Stop   10/04/14 0000   cefTRIAXone 2 g in dextrose 5 % 50 mL     2 g 100 mL/hr over 30 Minutes Intravenous Every 24 hours 10/04/14 1108 11/02/14 2359   10/04/14 0000  vancomycin (VANCOCIN) 1 GM/200ML SOLN     1,000 mg 200 mL/hr over 60 Minutes Intravenous Every 8 hours 10/04/14 1108 11/02/14 2359   10/02/14 0759  vancomycin (VANCOCIN) IVPB 1000 mg/200 mL premix     1,000 mg 200 mL/hr over 60 Minutes Intravenous Every 8 hours 10/02/14 0018     10/01/14 2200  cefTRIAXone (ROCEPHIN) 2 g in dextrose 5 % 50 mL IVPB     2 g 100 mL/hr over 30 Minutes Intravenous Every 24 hours 10/01/14 1636     09/29/14 1200  vancomycin (VANCOCIN) IVPB 1000 mg/200 mL premix  Status:  Discontinued     1,000 mg 200 mL/hr over 60 Minutes Intravenous Every 12 hours 09/29/14 0235 10/02/14 0018   09/29/14 0800  piperacillin-tazobactam (ZOSYN) IVPB 3.375 g  Status:  Discontinued     3.375 g 12.5 mL/hr over 240 Minutes Intravenous Every 8 hours 09/29/14 0235 10/01/14 1636   09/29/14 0245  vancomycin (VANCOCIN) IVPB 1000 mg/200 mL premix     1,000 mg 200 mL/hr over 60 Minutes Intravenous  Once 09/29/14 0235 09/29/14 0435   09/29/14 0245  piperacillin-tazobactam (ZOSYN) IVPB 3.375 g  3.375 g 100 mL/hr over 30 Minutes Intravenous  Once 09/29/14 0235 09/29/14 0405      Assessment: 39yo female continues on vanc and ceftriaxone for septic arthritis/right ankle osteomyelitis.  Vanc through subtherapeutic today on current dose.  Pt to be discharged today, plans to continue antibiotics through 9/6.  8/3 BCx2 >> ngtd  Zosyn 8/3 >> 8/5 Vanc 8/3 >> (VT subtherapeutic at 11 while on 1000mg  Q8h) CTX 8/5 >>   Goal of Therapy:  Vancomycin trough level 15-20 mcg/ml  Plan:  - Increase vanc to 1500mg  IV Q8h - Continue CTX 2g Q24h - Discharge today 8/8, last dose of abx 9/6 - Monitor renal fxn, VT on 8/10 as indicated in discharge summary   Waynette Buttery, PharmD Clinical Pharmacy Resident Pager: [phone] 10/04/2014 11:35 AM

## 2014-10-04 NOTE — Clinical Social Work Note (Signed)
Currently patient does NOT have SNF bed offers. Facility representative from [facility] will come evaluate patient today, 8/8 at bedside before making final decision.   CSW remains available as needed.  Derenda Fennel, MSW, LCSWA [phone] 10/04/2014 10:35 AM

## 2014-10-04 NOTE — Clinical Social Work Placement (Signed)
   CLINICAL SOCIAL WORK PLACEMENT  NOTE  Date:  10/04/2014  Patient Details  Name: Monique [name] MRN: [id number] Date of Birth: 29-May-1975  Clinical Social Work is seeking post-discharge placement for this patient at the Skilled  Nursing Facility level of care (*CSW will initial, date and re-position this form in  chart as items are completed):  No   Patient/family provided with [facility] Clinical Social Work Department's list of facilities offering this level of care within the geographic area requested by the patient (or if unable, by the patient's family).  Yes   Patient/family informed of their freedom to choose among providers that offer the needed level of care, that participate in Medicare, Medicaid or managed care program needed by the patient, have an available bed and are willing to accept the patient.  No   Patient/family informed of [facility]'s ownership interest in [facility] and [facility], as well as of the fact that they are under no obligation to receive care at these facilities.  PASRR submitted to EDS on 10/01/14     PASRR number received on 10/01/14     Existing PASRR number confirmed on       FL2 transmitted to all facilities in geographic area requested by pt/family on 10/01/14     FL2 transmitted to all facilities within larger geographic area on 10/01/14     Patient informed that his/her managed care company has contracts with or will negotiate with certain facilities, including the following:        Yes   Patient/family informed of bed offers received.  Patient chooses bed at  [facility] )     Physician recommends and patient chooses bed at      Patient to be transferred to  [facility] ) on 10/04/14.  Patient to be transferred to facility by  [facility] )     Patient family notified on 10/04/14 of transfer.  Name of family member notified:   (Pt reproted she will notify family. )     PHYSICIAN Please  sign FL2     Additional Comment:    _______________________________________________ Derenda Fennel, MSW, LCSWA [phone] 10/04/2014 1:11 PM

## 2014-10-04 NOTE — Discharge Summary (Signed)
Physician Discharge Summary  Monique [name] RFF:[id number] DOB: 20-Aug-1975 DOA: 09/28/2014  PCP: Default, Provider, MD  Admit date: 09/28/2014 Discharge date: 10/04/2014  Recommendations for Outpatient Follow-up:  1. Pt will need to follow up with PCP in 2 weeks post discharge 2. Please obtain BMP and CBC once per week, next draw 10/08/14 3. Discontinue PICC line after last dose of antibiotics on 11/02/14 4. Continue vancomycin and ceftriaxone through 11/02/2014 5. Check vancomycin trough 10/06/2014 and adjust dose accordingly for vancomycin level 15-20  Discharge Diagnoses:  Septic arthritis/right ankle osteomyelitis--tibiotalar joint -Noncompliant with antibiotics prior to admission -Continue vancomycin -Discontinue Zosyn, start ceftriaxone -plan 42 days of IV abx with start date of 09/28/14 -finish ceftriaxone and vancomycin after last doses on 11/02/14 -ESR 22,  -CRP 0.6 -I had a frank discussion with the patient regarding her substance abuse history and the only way she can receive intravenous antibiotics would be for her to go to a skilled nursing facility  -She expressed understanding and is agreeable to go to a skilled nursing facility to finish 6 weeks of intravenous antibiotics -PICC line will be placed and the patient will be discharged with antibiotics for 6 additional weeks -10/01/14--PICC placed -Surgical cultures on 09/02/2014 during hardware removal were negative.  -appreciate Dr. Sharol Given consult-->hydrotherapy -Amitriptyline at bedtime to help with pain -follow up with Dr. Sharol Given in office in 2 weeks -continue santyl dressings daily after hydrotherapy daily. Polysubstance abuse -Including cocaine, THC, tobacco -Tobacco cessation discussed -NicoDerm patch -HIV--neg on 09/01/14 Left eye conjunctivitis -Improving with Ciloxan -told pt to avoid eye make-up for the next week  -resolved Hypokalemia -Repleted Noncompliance with medical therapy -will need to go to SNF for  IV abx when there is accepting facility  Discharge Condition: stable  Disposition:  Follow-up Information    Follow up with DUDA,MARCUS V, MD In 2 weeks.   Specialty:  Orthopedic Surgery   Contact information:   [place] [address] [phone]     SNF  Diet:regular Wt Readings from Last 3 Encounters:  09/28/14 81.647 kg (180 lb)  09/01/14 91.763 kg (202 lb 4.8 oz)  06/07/13 82.101 kg (181 lb)    History of present illness:  39 year old female with history of bipolar disorder, polysubstance abuse, tobacco use, anxiety, depression presented with increasing right ankle swelling and pain. 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 The patient had removal of her hardware on 09/02/2014 performed by Dr. Sharol Given. The patient was treated with vancomycin and ceftriaxone during her hospitalization. Because of her history of substance abuse, the patient was discharged with oral trimethoprim sulfamethoxazole for 12 weeks. Unfortunately, the patient never got the prescription filled. As result, she had progressive pain and swelling in her right ankle. The patient was seen by orthopedics, Dr. Sharol Given. He recommended hydrotherapy. The patient was started on vancomycin and ceftriaxone. Her erythema and edema improved. The patient was agreeable to go to skilled nursing facility for intravenous antibiotics. A PICC line was placed. She will continue antibiotics through 11/02/2014 which will mark 42 days of therapy.  Consultants: Ortho  Discharge Exam: Filed Vitals:   10/04/14 1100  BP: 108/51  Pulse: 62  Temp: 98.2 F (36.8 C)  Resp: 20   Filed Vitals:   10/03/14 2206 10/04/14 0205 10/04/14 0606 10/04/14 1100  BP: 113/66 106/46 120/54 108/51  Pulse: 63 63 61 62  Temp: 98.3 F (36.8 C) 98.1 F (  36.7 C) 97.5 F (36.4 C) 98.2 F (36.8 C)  TempSrc: Oral Oral Oral Oral   Resp: _0 Height:      Weight:      SpO2: 99% 99% 98% 98%   General: A&O x 3, NAD, pleasant, cooperative Cardiovascular: RRR, no rub, no gallop, no S3 Respiratory: CTAB, no wheeze, no rhonchi Abdomen:soft, nontender, nondistended, positive bowel sounds Extremities: No edema, No lymphangitis, no petechiae; mild edema about the right ankle without lymphangitis, crepitance, necrosis; dorsalis pedis pulse palpable.  Discharge Instructions      Discharge Instructions    Diet - low sodium heart healthy    Complete by:  As directed      Diet - low sodium heart healthy    Complete by:  As directed      Increase activity slowly    Complete by:  As directed      Increase activity slowly    Complete by:  As directed             Medication List    STOP taking these medications        albuterol 108 (90 BASE) MCG/ACT inhaler  Commonly known as:  PROVENTIL HFA;VENTOLIN HFA     ALPRAZolam 0.25 MG tablet  Commonly known as:  XANAX     buPROPion 100 MG tablet  Commonly known as:  WELLBUTRIN  Replaced by:  buPROPion 100 MG 12 hr tablet     sulfamethoxazole-trimethoprim 800-160 MG per tablet  Commonly known as:  BACTRIM DS,SEPTRA DS      TAKE these medications        acetaminophen 325 MG tablet  Commonly known as:  TYLENOL  Take 2 tablets (650 mg total) by mouth every 6 (six) hours as needed for mild pain (or Fever >/= 101).     amitriptyline 10 MG tablet  Commonly known as:  ELAVIL  Take 1 tablet (10 mg total) by mouth at bedtime.     buPROPion 100 MG 12 hr tablet  Commonly known as:  WELLBUTRIN SR  Take 1 tablet (100 mg total) by mouth 2 (two) times daily.     cefTRIAXone 2 g in dextrose 5 % 50 mL  Inject 2 g into the vein daily. Last dose on 11/02/14     collagenase ointment  Commonly known as:  SANTYL  Apply topically daily. To right ankle and change dressing every 48 hours     nicotine 21 mg/24hr patch  Commonly known as:  NICODERM CQ - dosed in mg/24  hours  Place 1 patch (21 mg total) onto the skin daily.     oxyCODONE-acetaminophen 5-325 MG per tablet  Commonly known as:  PERCOCET/ROXICET  Take 1 tablet by mouth every 4 (four) hours as needed for moderate pain.     vancomycin 1 GM/200ML Soln  Commonly known as:  VANCOCIN  Inject 200 mLs (1,000 mg total) into the vein every 8 (eight) hours. Last dose on 11/02/14         The results of significant diagnostics from this hospitalization (including imaging, microbiology, ancillary and laboratory) are listed below for reference.    Significant Diagnostic Studies: Dg Ankle Complete Right  09/29/2014   CLINICAL DATA:  Pain and swelling.  Recent surgery.  EXAM: RIGHT ANKLE - COMPLETE 3+ VIEW  COMPARISON:  MRI 08/30/2014, radiographs 08/30/2014  FINDINGS: The distal fibular plate screw hardware has been removed. The inter osseous screws and medial malleolar screws have also been removed.  A single screw remains at the lateral aspect of the distal fibula. The screw tracts remain clearly visible. There is no acute fracture. There is mild ossification of the interosseous ligament. No frank bony destruction. Mortise is slightly widened laterally. Talar dome appears intact.  IMPRESSION: Hardware removal. No frank bony destruction. No fracture or dislocation.   Electronically Signed   By: Andreas Newport M.D.   On: 09/29/2014 00:24     Microbiology: Recent Results (from the past 240 hour(s))  Culture, blood (x 2)     Status: None (Preliminary result)   Collection Time: 09/29/14 12:30 AM  Result Value Ref Range Status   Specimen Description BLOOD LEFT ARM  Final   Special Requests BOTTLES DRAWN AEROBIC AND ANAEROBIC 5ML  Final   Culture NO GROWTH 4 DAYS  Final   Report Status PENDING  Incomplete  MRSA PCR Screening     Status: None   Collection Time: 09/29/14  2:59 AM  Result Value Ref Range Status   MRSA by PCR NEGATIVE NEGATIVE Final    Comment:        The GeneXpert MRSA Assay (FDA approved  for NASAL specimens only), is one component of a comprehensive MRSA colonization surveillance program. It is not intended to diagnose MRSA infection nor to guide or monitor treatment for MRSA infections.   Culture, blood (x 2)     Status: None (Preliminary result)   Collection Time: 09/29/14  4:00 AM  Result Value Ref Range Status   Specimen Description BLOOD RIGHT ANTECUBITAL  Final   Special Requests BOTTLES DRAWN AEROBIC ONLY 5CC  Final   Culture NO GROWTH 4 DAYS  Final   Report Status PENDING  Incomplete     Labs: Basic Metabolic Panel:  Recent Labs Lab 09/28/14 2034 09/29/14 0400 09/30/14 0445 10/02/14 0500  NA 140 140 137 135  K 3.7 3.3* 3.9 4.1  CL 110 109 107 99*  CO2 _0 GLUCOSE 132* 128* 115* 107*  BUN _1 CREATININE 0.68 0.66 0.81 0.80  CALCIUM 8.8* 8.5* 8.6* 8.5*   Liver Function Tests:  Recent Labs Lab 09/28/14 2034  AST 21  ALT 15  ALKPHOS 53  BILITOT 0.1*  PROT 6.6  ALBUMIN 3.3*   No results for input(s): LIPASE, AMYLASE in the last 168 hours. No results for input(s): AMMONIA in the last 168 hours. CBC:  Recent Labs Lab 09/28/14 2034 09/29/14 0400 09/30/14 0445 10/02/14 0500  WBC 5.2 6.1 5.1 3.9*  NEUTROABS 2.3  --   --   --   HGB 9.8* 9.1* 9.9* 9.6*  HCT 33.3* 30.6* 33.5* 32.8*  MCV 82.8 83.2 84.2 83.2  PLT 287 250 257 270   Cardiac Enzymes: No results for input(s): CKTOTAL, CKMB, CKMBINDEX, TROPONINI in the last 168 hours. BNP: Invalid input(s): POCBNP CBG: No results for input(s): GLUCAP in the last 168 hours.  Time coordinating discharge:  Greater than 30 minutes  Signed:  Ralynn San, DO [facility] Pager: [phone] 10/04/2014, 11:10 AM

## 2014-11-25 ENCOUNTER — Encounter (HOSPITAL_COMMUNITY): Payer: Self-pay | Admitting: Emergency Medicine

## 2014-11-25 ENCOUNTER — Emergency Department (HOSPITAL_COMMUNITY): Payer: Self-pay

## 2014-11-25 ENCOUNTER — Emergency Department (HOSPITAL_COMMUNITY)
Admission: EM | Admit: 2014-11-25 | Discharge: 2014-11-25 | Disposition: A | Discharge status: Home / Self Care | Payer: Self-pay | Attending: Emergency Medicine | Admitting: Emergency Medicine

## 2014-11-25 DIAGNOSIS — M25579 Pain in unspecified ankle and joints of unspecified foot: Secondary | ICD-10-CM

## 2014-11-25 DIAGNOSIS — Z8614 Personal history of Methicillin resistant Staphylococcus aureus infection: Secondary | ICD-10-CM | POA: Insufficient documentation

## 2014-11-25 DIAGNOSIS — Z79899 Other long term (current) drug therapy: Secondary | ICD-10-CM | POA: Insufficient documentation

## 2014-11-25 DIAGNOSIS — F319 Bipolar disorder, unspecified: Secondary | ICD-10-CM | POA: Insufficient documentation

## 2014-11-25 DIAGNOSIS — Z720 Tobacco use: Secondary | ICD-10-CM | POA: Insufficient documentation

## 2014-11-25 DIAGNOSIS — M009 Pyogenic arthritis, unspecified: Secondary | ICD-10-CM | POA: Insufficient documentation

## 2014-11-25 LAB — CBC WITH DIFFERENTIAL/PLATELET
Basophils Absolute: 0 10*3/uL (ref 0.0–0.1)
Basophils Relative: 0 %
Eosinophils Absolute: 0.1 10*3/uL (ref 0.0–0.7)
Eosinophils Relative: 2 %
HCT: 34.9 % — ABNORMAL LOW (ref 36.0–46.0)
Hemoglobin: 10.2 g/dL — ABNORMAL LOW (ref 12.0–15.0)
Lymphocytes Relative: 45 %
Lymphs Abs: 2.7 10*3/uL (ref 0.7–4.0)
MCH: 23.2 pg — ABNORMAL LOW (ref 26.0–34.0)
MCHC: 29.2 g/dL — ABNORMAL LOW (ref 30.0–36.0)
MCV: 79.3 fL (ref 78.0–100.0)
Monocytes Absolute: 0.6 10*3/uL (ref 0.1–1.0)
Monocytes Relative: 9 %
Neutro Abs: 2.7 10*3/uL (ref 1.7–7.7)
Neutrophils Relative %: 44 %
Platelets: 386 10*3/uL (ref 150–400)
RBC: 4.4 MIL/uL (ref 3.87–5.11)
RDW: 15.5 % (ref 11.5–15.5)
WBC: 6.1 10*3/uL (ref 4.0–10.5)

## 2014-11-25 LAB — I-STAT CHEM 8, ED
BUN: 18 mg/dL (ref 6–20)
Calcium, Ion: 1.16 mmol/L (ref 1.12–1.23)
Chloride: 104 mmol/L (ref 101–111)
Creatinine, Ser: 0.7 mg/dL (ref 0.44–1.00)
Glucose, Bld: 116 mg/dL — ABNORMAL HIGH (ref 65–99)
HCT: 35 % — ABNORMAL LOW (ref 36.0–46.0)
Hemoglobin: 11.9 g/dL — ABNORMAL LOW (ref 12.0–15.0)
Potassium: 4.1 mmol/L (ref 3.5–5.1)
Sodium: 138 mmol/L (ref 135–145)
TCO2: 24 mmol/L (ref 0–100)

## 2014-11-25 LAB — BASIC METABOLIC PANEL
Anion gap: 8 (ref 5–15)
BUN: 16 mg/dL (ref 6–20)
CO2: 24 mmol/L (ref 22–32)
Calcium: 9.2 mg/dL (ref 8.9–10.3)
Chloride: 105 mmol/L (ref 101–111)
Creatinine, Ser: 0.7 mg/dL (ref 0.44–1.00)
GFR calc Af Amer: >60 mL/min (ref >60)
GFR calc non Af Amer: >60 mL/min (ref >60)
Glucose, Bld: 113 mg/dL — ABNORMAL HIGH (ref 65–99)
Potassium: 4.3 mmol/L (ref 3.5–5.1)
Sodium: 137 mmol/L (ref 135–145)

## 2014-11-25 MED ORDER — DOXYCYCLINE HYCLATE 100 MG PO TABS
100.0000 mg | ORAL_TABLET | Freq: Once | ORAL | Status: AC
  Administered 2014-11-25: 100 mg via ORAL
  Filled 2014-11-25: qty 1

## 2014-11-25 MED ORDER — DOXYCYCLINE HYCLATE 100 MG PO CAPS: 100.0000 mg | ORAL_CAPSULE | Freq: Two times a day (BID) | ORAL | Status: DC

## 2014-11-25 MED ORDER — FENTANYL CITRATE (PF) 100 MCG/2ML IJ SOLN
50.0000 ug | Freq: Once | INTRAMUSCULAR | Status: AC
  Administered 2014-11-25: 50 ug via INTRAVENOUS

## 2014-11-25 MED ORDER — GADOBENATE DIMEGLUMINE 529 MG/ML IV SOLN
20.0000 mL | Freq: Once | INTRAVENOUS | Status: AC | PRN
  Administered 2014-11-25: 20 mL via INTRAVENOUS

## 2014-11-25 MED ORDER — OXYCODONE-ACETAMINOPHEN 5-325 MG PO TABS: 1.0000 | ORAL_TABLET | Freq: Four times a day (QID) | ORAL | Status: DC | PRN

## 2014-11-25 MED ORDER — FENTANYL CITRATE (PF) 100 MCG/2ML IJ SOLN
50.0000 ug | Freq: Once | INTRAMUSCULAR | Status: AC
  Administered 2014-11-25: 50 ug via INTRAVENOUS
  Filled 2014-11-25: qty 2

## 2014-11-25 NOTE — ED Notes (Signed)
Pt had right ankle surgery for fracture in July. Per pt, had "infection" post op. Went to [facility] until 1 month ago. States has NOT had a follow up visit with the surgeon because she is waiting for Medicaid approval. C/o pain right ankle. ALSO, c/o insect bite to right dorsal foot.

## 2014-11-25 NOTE — ED Provider Notes (Signed)
CSN: [id number]     Arrival date & time 11/25/14  1034 History  By signing my name below, I, Emmanuella Mensah, attest that this documentation has been prepared under the direction and in the presence of [employer], PA-C. Electronically Signed: Angelene Giovanni, ED Scribe. 11/25/2014. 4:38 PM.    Chief Complaint  Patient presents with  . Ankle Pain   The history is provided by the patient. No language interpreter was used.   HPI Comments:  Monique [name] is a 39 y.o. female who presents to the Emergency Department complaining of right ankle pain and swelling. Patient suffered a bimalleolar fracture with subsequent septic arthritis. Patient was discharged on 09/06/2014 with oral antibiotics after hardware removal on 09/02/2014. Patient was treated with vancomycin and ceftriaxone during her hospitalization, she was discharged home with Bactrim. Patient was noncompliant with medication, did not follow-up with Dr. [name] as recommended. Patient was again seen on 09/29/2014 with septic arthritis with significant redness swelling and edema. She was treated with vancomycin and ceftriaxone and discharged to skilled nursing facility with a PICC line and intravenous antibiotics. Patient reports that she completed antibiotic therapy on 11/02/2014. Patient reports that since the time of antibiotic therapy she has had persistent swelling to the right ankle, reports the redness has proved significantly. At the time of evaluation patient reports pain, swelling, difficulty with ambulation. She denies fever, chills, nausea, vomiting, spreading of swelling of the distal extremity. Patient reports she's been using Tylenol and ibuprofen with minimal relief of symptoms. She states that she is waiting on her Medicaid to come through before she is able to follow-up with orthopedist. Patient reports that she also was waiting for an orange card. Patient's reason for visit today is pain management, reports the ankle has  not had any significant changes over the last month.   Past Medical History  Diagnosis Date  . MRSA infection   . Bipolar disorder    Past Surgical History  Procedure Laterality Date  . Tubal ligation    . Hardware removal Right 09/02/2014    Procedure: Removal Right Lateral Ankle Hardware, Place Antibiotic Bead ;  Surgeon: [name], MD;  Location: [facility] ORS;  Service: Orthopedics;  Laterality: Right;   Family History  Problem Relation Age of Onset  . Cancer Mother     right hip cancer    Social History  Substance Use Topics  . Smoking status: Current Every Day Smoker -- 1.00 packs/day    Types: Cigarettes  . Smokeless tobacco: Never Used  . Alcohol Use: 1.2 oz/week    2 Cans of beer per week     Comment: in treatment    OB History    No data available     Review of Systems  All other systems reviewed and are negative.   Allergies  Review of patient's allergies indicates no known allergies.  Home Medications   Prior to Admission medications   Medication Sig Start Date End Date Taking? Authorizing Provider  acetaminophen (TYLENOL) 325 MG tablet Take 2 tablets (650 mg total) by mouth every 6 (six) hours as needed for mild pain (or Fever >/= 101). 09/06/14   Alison Murray, MD  amitriptyline (ELAVIL) 10 MG tablet Take 1 tablet (10 mg total) by mouth at bedtime. 10/04/14   Catarina Hartshorn, MD  buPROPion (WELLBUTRIN SR) 100 MG 12 hr tablet Take 1 tablet (100 mg total) by mouth 2 (two) times daily. 10/04/14   Catarina Hartshorn, MD  collagenase (SANTYL) ointment  Apply topically daily. To right ankle and change dressing every 48 hours 10/04/14   Catarina Hartshorn, MD  doxycycline (VIBRAMYCIN) 100 MG capsule Take 1 capsule (100 mg total) by mouth 2 (two) times daily. 11/25/14   Eyvonne Mechanic, PA-C  nicotine (NICODERM CQ - DOSED IN MG/24 HOURS) 21 mg/24hr patch Place 1 patch (21 mg total) onto the skin daily. 10/04/14   Catarina Hartshorn, MD  oxyCODONE-acetaminophen (PERCOCET/ROXICET) 5-325 MG tablet Take 1 tablet by  mouth every 6 (six) hours as needed for severe pain. 11/25/14   Kymere Fullington, PA-C   BP 136/77 mmHg  Pulse 85  Temp(Src) 98.2 F (36.8 C) (Oral)  Resp 18  Ht  (1.626 m)  Wt 180 lb (81.647 kg)  BMI 30.88 kg/m2  SpO2 98%  LMP 11/04/2014 (Approximate)   Physical Exam  Constitutional: She is oriented to person, place, and time. She appears well-developed and well-nourished. No distress.  HENT:  Head: Normocephalic and atraumatic.  Eyes: Conjunctivae and EOM are normal.  Neck: Neck supple. No tracheal deviation present.  Cardiovascular: Normal rate.   Pulmonary/Chest: Effort normal. No respiratory distress.  Musculoskeletal: Normal range of motion.  Obvious swelling to the right distal ankle, no redness, surrounding erythema. Patient has very minimal flexion of the ankle due to pain. Sensation intact, postoperative scars without signs of infection. Remainder of extremity atraumatic, no swelling.  Neurological: She is alert and oriented to person, place, and time.  Skin: Skin is warm and dry.  Psychiatric: She has a normal mood and affect. Her behavior is normal.  Nursing note and vitals reviewed.   ED Course  Procedures (including critical care time) DIAGNOSTIC STUDIES: Oxygen Saturation is 98% on RA, normal by my interpretation.    COORDINATION OF CARE: 12:32 PM- Pt advised of plan for treatment and pt agrees.   Imaging Review Dg Ankle Complete Right  11/25/2014   CLINICAL DATA:  Chronic ankle fracture. Pain and swelling. Hardware removed 1 month ago.  EXAM: RIGHT ANKLE - COMPLETE 3+ VIEW  COMPARISON:  09/28/2014  FINDINGS: Bimalleolar ankle fracture has healed. Hardware removal with the exception of a single screw overlying the fibula is unchanged from the prior study. There is diffuse soft tissue swelling.  There is mild joint space narrowing in the ankle joint with joint effusion. There is a large area of mixed sclerotic and lytic change in the distal tibia extending to  the tibial plafond and which has progressed in appearance from the prior study. This may be an osteochondral fragment. This is difficult to identify on the prior MRI of 08/30/2014 due to extensive artifact from hardware. The hardware has been removed in the interval and follow-up MRI may be helpful.  IMPRESSION: Postop bimalleolar ankle fracture with hardware removal.  There is a large joint effusion and diffuse soft tissue swelling  Large defect in the distal tibia could represent osteochondral defect. Consider MRI for further evaluation.   Electronically Signed   By: Marlan Palau M.D.   On: 11/25/2014 11:55   Mr Ankle Right W Wo Contrast  11/25/2014   CLINICAL DATA:  Infected hardware and chronic septic arthritis. Hardware removed from RIGHT ankle. RIGHT ankle pain. Postoperative infection last year.  EXAM: MRI OF THE RIGHT ANKLE WITHOUT AND WITH CONTRAST  TECHNIQUE: Multiplanar, multisequence MR imaging of the ankle was performed before and after the administration of intravenous contrast.  CONTRAST:  20mL MULTIHANCE GADOBENATE DIMEGLUMINE 529 MG/ML IV SOLN  COMPARISON:  None.  FINDINGS: Interfragmentary screw in  the distal fibula remains present with the attendant artifact.  Florid inflammatory changes are present around the ankle. 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 There is a large area of nonenhancing sclerotic bone in the central tibial plafond. 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 Diffuse enhancing synovitis of the ankle joint is present. There is enhancing granulation tissue in the screw tracts along the medial malleolus and at the site of the removed syndesmotic screws. Ankle effusion and periarticular phlegmon is present.  There is subchondral sclerosis and partial collapse of the talar dome centrally. This may be secondary to AVN or infection or combination of  the 2. Subtalar effusion is present but no convincing evidence of subtalar septic arthritis. Midfoot shows heterogeneous marrow signal likely secondary to disuse osteopenia.  Grossly, the posterior medial, posterior lateral and anterior tendons are intact. The Achilles tendon is intact. Plantar fascia is normal.  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 Septic joint effusion and periarticular phlegmon. 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 No soft tissue abscess. The enhancement along the removed hardware tract may represent granulation tissue but given the septic arthritis, could also represent other sites of developing osteomyelitis.   Electronically Signed   By: Andreas Newport M.D.   On: 11/25/2014 15:53   Eyvonne Mechanic, PA-C personally reviewed and evaluated these images results as part of his medical decision-making.   EKG Interpretation None      MDM   Final diagnoses:  Ankle pain  Septic arthritis   Labs: I-STAT Chem-8, CBC, BMP- no significant findings.  Imaging: DG ankle, MRI ankle-see above  Consults: Dr. [name]  Therapeutics: Fentanyl, doxycycline  Discharge Meds: Doxycycline  Assessment/Plan: 39 year old female presents today with septic arthritis, osteomyelitis. This appears to be chronic in nature, patient has no significant redness, or surrounding cellulitis. Due to patient's history of chronic septic arthritis, Dr. [name] was consult at. Instructed to follow through with MRI study which showed both of the above. Results were read to Dr. [name] who instructed to have patient started on doxycycline outpatient, contact him immediately for follow-up in office and likely surgical management. Patient's vital signs reassuring here, laboratory findings reassuring. Patient has no other chronic health conditions that would confound today's presentation, she is not immunosuppressed. Patient will  be discharged home with instructions to take doxycycline, he is pain medication only as needed for severe breakthrough pain, use ibuprofen or Tylenol for moderate pain. She is instructed to return emergently to the ED if new or worsening signs or symptoms present. Patient is instructed to contact Dr. Audrie Lia office immediately for follow-up evaluation, if she is unable to follow-up with him she is to return to the emergency room for further evaluation and management. Patient verbalized her understanding and agreement with today's plan and had no further questions or concerns at time of discharge.     I personally performed the services described in this documentation, which was scribed in my presence. The recorded information has been reviewed and is accurate.   Eyvonne Mechanic, PA-C 11/25/14 1638  Gilda Crease, MD 11/26/14 [phone]

## 2014-11-25 NOTE — ED Notes (Signed)
Pt still in MRI 

## 2014-11-25 NOTE — ED Notes (Signed)
Pt stable, ambulatory, states understanding of discharge instructions 

## 2014-11-25 NOTE — Discharge Instructions (Signed)
Please use medication as directed, please contact orthopedic specialist immediately for follow-up evaluation. If you're unable to follow-up with orthopedist please return to the emergency room for further evaluation and management. Please monitor for new or worsening signs or symptoms, return immediately if any present.

## 2014-12-08 ENCOUNTER — Other Ambulatory Visit (HOSPITAL_COMMUNITY): Payer: Self-pay | Admitting: Orthopedic Surgery

## 2014-12-16 ENCOUNTER — Encounter (HOSPITAL_COMMUNITY): Payer: Self-pay | Admitting: Vascular Surgery

## 2014-12-16 ENCOUNTER — Encounter (HOSPITAL_COMMUNITY): Payer: Self-pay | Admitting: *Deleted

## 2014-12-16 NOTE — Progress Notes (Signed)
Pt  C/O bump on chin that she has been picking with, a runny nose ( clear ) and a productive cough. Pt denies fever. Please assess DOS.

## 2014-12-16 NOTE — Progress Notes (Signed)
Anesthesia Chart Review: SAME DAY WORK UP.  39 year old female scheduled for open debridement of right ankle on 12/17/14 by Dr. Lajoyce Cornersuda. Hx includes septic arthritis and osteomyelitis of the right tibiotalar joint s/p removal of infected hardware 09/02/14, smoking, MRSA, Bipolar, polysubstance abuse including cocaine and THC (+ UDS 08/29/14).   08/29/14 EKG: NSR, probable anteroseptal infarct, old.  08/29/14 1V CXR: IMPRESSION: Low lung volumes without focal disease.  She is for labs on arrival. Will defer decision for pre-operative UDS to surgeon and/or anesthesiologist based on exam findings the day of her surgery. She is a same day work-up, so further evaluation on arrival.  Velna Ochsllison Rennie Hack, PA-C [facility]/Anesthesiology Phone [phone] 12/16/2014 10:47 AM

## 2014-12-17 ENCOUNTER — Encounter (HOSPITAL_COMMUNITY)
Admission: RE | Disposition: A | Discharge status: Home / Self Care | Payer: Self-pay | Source: Ambulatory Visit | Attending: Internal Medicine

## 2014-12-17 ENCOUNTER — Encounter (HOSPITAL_COMMUNITY): Payer: Self-pay | Admitting: Certified Registered"

## 2014-12-17 ENCOUNTER — Inpatient Hospital Stay (HOSPITAL_COMMUNITY)
Admission: RE | Admit: 2014-12-17 | Discharge: 2014-12-22 | DRG: 603 | Disposition: A | Discharge status: Home / Self Care | Payer: Medicaid Other | Source: Ambulatory Visit | Attending: Internal Medicine | Admitting: Internal Medicine

## 2014-12-17 DIAGNOSIS — Z720 Tobacco use: Secondary | ICD-10-CM

## 2014-12-17 DIAGNOSIS — F17210 Nicotine dependence, cigarettes, uncomplicated: Secondary | ICD-10-CM | POA: Diagnosis present

## 2014-12-17 DIAGNOSIS — D638 Anemia in other chronic diseases classified elsewhere: Secondary | ICD-10-CM | POA: Diagnosis present

## 2014-12-17 DIAGNOSIS — F1210 Cannabis abuse, uncomplicated: Secondary | ICD-10-CM | POA: Diagnosis present

## 2014-12-17 DIAGNOSIS — M869 Osteomyelitis, unspecified: Secondary | ICD-10-CM | POA: Diagnosis present

## 2014-12-17 DIAGNOSIS — F1410 Cocaine abuse, uncomplicated: Secondary | ICD-10-CM | POA: Diagnosis present

## 2014-12-17 DIAGNOSIS — J209 Acute bronchitis, unspecified: Secondary | ICD-10-CM | POA: Diagnosis present

## 2014-12-17 DIAGNOSIS — L03211 Cellulitis of face: Secondary | ICD-10-CM | POA: Diagnosis present

## 2014-12-17 DIAGNOSIS — M19071 Primary osteoarthritis, right ankle and foot: Secondary | ICD-10-CM

## 2014-12-17 DIAGNOSIS — K219 Gastro-esophageal reflux disease without esophagitis: Secondary | ICD-10-CM | POA: Diagnosis present

## 2014-12-17 DIAGNOSIS — B9561 Methicillin susceptible Staphylococcus aureus infection as the cause of diseases classified elsewhere: Secondary | ICD-10-CM | POA: Diagnosis present

## 2014-12-17 DIAGNOSIS — G629 Polyneuropathy, unspecified: Secondary | ICD-10-CM | POA: Diagnosis present

## 2014-12-17 DIAGNOSIS — Z8614 Personal history of Methicillin resistant Staphylococcus aureus infection: Secondary | ICD-10-CM

## 2014-12-17 DIAGNOSIS — L02415 Cutaneous abscess of right lower limb: Secondary | ICD-10-CM | POA: Diagnosis present

## 2014-12-17 DIAGNOSIS — M1990 Unspecified osteoarthritis, unspecified site: Secondary | ICD-10-CM | POA: Diagnosis present

## 2014-12-17 DIAGNOSIS — F419 Anxiety disorder, unspecified: Secondary | ICD-10-CM | POA: Diagnosis present

## 2014-12-17 DIAGNOSIS — L0201 Cutaneous abscess of face: Secondary | ICD-10-CM | POA: Diagnosis present

## 2014-12-17 DIAGNOSIS — L0390 Cellulitis, unspecified: Secondary | ICD-10-CM | POA: Diagnosis present

## 2014-12-17 DIAGNOSIS — F319 Bipolar disorder, unspecified: Secondary | ICD-10-CM | POA: Diagnosis present

## 2014-12-17 DIAGNOSIS — L0291 Cutaneous abscess, unspecified: Secondary | ICD-10-CM | POA: Diagnosis present

## 2014-12-17 HISTORY — DX: Unspecified mononeuropathy of right lower limb: G57.91

## 2014-12-17 HISTORY — DX: Migraine, unspecified, not intractable, without status migrainosus: G43.909

## 2014-12-17 HISTORY — DX: Major depressive disorder, single episode, unspecified: F32.9

## 2014-12-17 HISTORY — DX: Anxiety disorder, unspecified: F41.9

## 2014-12-17 HISTORY — DX: Unspecified osteoarthritis, unspecified site: M19.90

## 2014-12-17 HISTORY — DX: Osteomyelitis, unspecified: M86.9

## 2014-12-17 HISTORY — DX: Depression, unspecified: F32.A

## 2014-12-17 HISTORY — DX: Other chronic pain: G89.29

## 2014-12-17 HISTORY — DX: Gastro-esophageal reflux disease without esophagitis: K21.9

## 2014-12-17 HISTORY — DX: Dorsalgia, unspecified: M54.9

## 2014-12-17 LAB — CBC
HCT: 36.1 % (ref 36.0–46.0)
HEMOGLOBIN: 10.8 g/dL — AB (ref 12.0–15.0)
MCH: 23.3 pg — ABNORMAL LOW (ref 26.0–34.0)
MCHC: 29.9 g/dL — AB (ref 30.0–36.0)
MCV: 78 fL (ref 78.0–100.0)
Platelets: 402 10*3/uL — ABNORMAL HIGH (ref 150–400)
RBC: 4.63 MIL/uL (ref 3.87–5.11)
RDW: 16.9 % — ABNORMAL HIGH (ref 11.5–15.5)
WBC: 10.2 10*3/uL (ref 4.0–10.5)

## 2014-12-17 LAB — COMPREHENSIVE METABOLIC PANEL
ALBUMIN: 3.8 g/dL (ref 3.5–5.0)
ALK PHOS: 57 U/L (ref 38–126)
ALT: 17 U/L (ref 14–54)
ANION GAP: 9 (ref 5–15)
AST: 18 U/L (ref 15–41)
BUN: 14 mg/dL (ref 6–20)
CALCIUM: 9 mg/dL (ref 8.9–10.3)
CO2: 19 mmol/L — AB (ref 22–32)
Chloride: 105 mmol/L (ref 101–111)
Creatinine, Ser: 0.57 mg/dL (ref 0.44–1.00)
GFR calc Af Amer: >60 mL/min (ref >60)
GFR calc non Af Amer: >60 mL/min (ref >60)
GLUCOSE: 119 mg/dL — AB (ref 65–99)
Potassium: 4.2 mmol/L (ref 3.5–5.1)
SODIUM: 133 mmol/L — AB (ref 135–145)
Total Bilirubin: 0.4 mg/dL (ref 0.3–1.2)
Total Protein: 7.2 g/dL (ref 6.5–8.1)

## 2014-12-17 LAB — PROTIME-INR
INR: 0.99 (ref 0.00–1.49)
Prothrombin Time: 13.3 seconds (ref 11.6–15.2)

## 2014-12-17 LAB — SURGICAL PCR SCREEN
MRSA, PCR: NEGATIVE
Staphylococcus aureus: POSITIVE — AB

## 2014-12-17 LAB — APTT: APTT: 29 s (ref 24–37)

## 2014-12-17 LAB — HCG, SERUM, QUALITATIVE: PREG SERUM: NEGATIVE

## 2014-12-17 SURGERY — IRRIGATION AND DEBRIDEMENT EXTREMITY
Anesthesia: General | Laterality: Right

## 2014-12-17 MED ORDER — CEFAZOLIN SODIUM-DEXTROSE 2-3 GM-% IV SOLR: 2.0000 g | INTRAVENOUS | Status: DC

## 2014-12-17 MED ORDER — VANCOMYCIN HCL 10 G IV SOLR
2000.0000 mg | Freq: Once | INTRAVENOUS | Status: AC
  Administered 2014-12-17: 2000 mg via INTRAVENOUS
  Filled 2014-12-17: qty 2000

## 2014-12-17 MED ORDER — ONDANSETRON HCL 4 MG PO TABS: 4.0000 mg | ORAL_TABLET | Freq: Four times a day (QID) | ORAL | Status: DC | PRN

## 2014-12-17 MED ORDER — MUPIROCIN 2 % EX OINT
TOPICAL_OINTMENT | CUTANEOUS | Status: AC
  Administered 2014-12-17: 1 via TOPICAL
  Filled 2014-12-17: qty 22

## 2014-12-17 MED ORDER — SENNOSIDES-DOCUSATE SODIUM 8.6-50 MG PO TABS
1.0000 | ORAL_TABLET | Freq: Every evening | ORAL | Status: DC | PRN
Start: 2014-12-17 — End: 2014-12-22

## 2014-12-17 MED ORDER — PHENYLEPHRINE HCL 10 MG/ML IJ SOLN
INTRAMUSCULAR | Status: AC
  Filled 2014-12-17: qty 1

## 2014-12-17 MED ORDER — OXYCODONE-ACETAMINOPHEN 5-325 MG PO TABS
2.0000 | ORAL_TABLET | Freq: Four times a day (QID) | ORAL | Status: DC | PRN
  Administered 2014-12-18 (×2): 2 via ORAL
  Filled 2014-12-17 (×2): qty 2

## 2014-12-17 MED ORDER — SODIUM CHLORIDE 0.9 % IJ SOLN: 3.0000 mL | INTRAMUSCULAR | Status: DC | PRN

## 2014-12-17 MED ORDER — ACETAMINOPHEN 650 MG RE SUPP: 650.0000 mg | Freq: Four times a day (QID) | RECTAL | Status: DC | PRN

## 2014-12-17 MED ORDER — OXYCODONE-ACETAMINOPHEN 5-325 MG PO TABS
2.0000 | ORAL_TABLET | Freq: Once | ORAL | Status: AC
  Administered 2014-12-17: 2 via ORAL
  Filled 2014-12-17: qty 2

## 2014-12-17 MED ORDER — FENTANYL CITRATE (PF) 100 MCG/2ML IJ SOLN: 100.0000 ug | Freq: Once | INTRAMUSCULAR | Status: DC

## 2014-12-17 MED ORDER — ONDANSETRON HCL 4 MG/2ML IJ SOLN: 4.0000 mg | Freq: Four times a day (QID) | INTRAMUSCULAR | Status: DC | PRN

## 2014-12-17 MED ORDER — ALPRAZOLAM 0.5 MG PO TABS
0.5000 mg | ORAL_TABLET | Freq: Every day | ORAL | Status: DC
  Administered 2014-12-17 – 2014-12-21 (×5): 0.5 mg via ORAL
  Filled 2014-12-17 (×5): qty 1

## 2014-12-17 MED ORDER — ROCURONIUM BROMIDE 50 MG/5ML IV SOLN
INTRAVENOUS | Status: AC
  Filled 2014-12-17: qty 1

## 2014-12-17 MED ORDER — MORPHINE SULFATE (PF) 4 MG/ML IV SOLN
4.0000 mg | Freq: Once | INTRAVENOUS | Status: AC
  Administered 2014-12-17: 4 mg via INTRAVENOUS
  Filled 2014-12-17: qty 1

## 2014-12-17 MED ORDER — DIPHENHYDRAMINE HCL 50 MG/ML IJ SOLN
25.0000 mg | Freq: Once | INTRAMUSCULAR | Status: AC
  Administered 2014-12-17: 25 mg via INTRAVENOUS
  Filled 2014-12-17: qty 1

## 2014-12-17 MED ORDER — LACTATED RINGERS IV SOLN
INTRAVENOUS | Status: DC
  Administered 2014-12-17: 12:00:00 via INTRAVENOUS

## 2014-12-17 MED ORDER — BUPROPION HCL ER (SR) 100 MG PO TB12
100.0000 mg | ORAL_TABLET | Freq: Two times a day (BID) | ORAL | Status: DC
  Administered 2014-12-17 – 2014-12-22 (×10): 100 mg via ORAL
  Filled 2014-12-17 (×15): qty 1

## 2014-12-17 MED ORDER — OXYCODONE-ACETAMINOPHEN 5-325 MG PO TABS
1.0000 | ORAL_TABLET | ORAL | Status: DC | PRN
  Administered 2014-12-17: 1 via ORAL
  Filled 2014-12-17: qty 1

## 2014-12-17 MED ORDER — ALPRAZOLAM 0.25 MG PO TABS
0.2500 mg | ORAL_TABLET | Freq: Two times a day (BID) | ORAL | Status: DC
  Administered 2014-12-17 – 2014-12-22 (×10): 0.25 mg via ORAL
  Filled 2014-12-17 (×10): qty 1

## 2014-12-17 MED ORDER — FENTANYL CITRATE (PF) 100 MCG/2ML IJ SOLN
INTRAMUSCULAR | Status: AC
  Filled 2014-12-17: qty 2

## 2014-12-17 MED ORDER — MIDAZOLAM HCL 2 MG/2ML IJ SOLN
INTRAMUSCULAR | Status: AC
  Filled 2014-12-17: qty 2

## 2014-12-17 MED ORDER — DIPHENHYDRAMINE HCL 25 MG PO CAPS
25.0000 mg | ORAL_CAPSULE | Freq: Four times a day (QID) | ORAL | Status: DC | PRN
  Administered 2014-12-17: 50 mg via ORAL
  Filled 2014-12-17: qty 1
  Filled 2014-12-17: qty 2

## 2014-12-17 MED ORDER — MORPHINE SULFATE (PF) 2 MG/ML IV SOLN
2.0000 mg | INTRAVENOUS | Status: DC | PRN
  Administered 2014-12-17 – 2014-12-22 (×24): 2 mg via INTRAVENOUS
  Filled 2014-12-17 (×23): qty 1

## 2014-12-17 MED ORDER — SODIUM CHLORIDE 0.9 % IV SOLN
Freq: Once | INTRAVENOUS | Status: AC
  Administered 2014-12-17: 10 mL/h via INTRAVENOUS

## 2014-12-17 MED ORDER — NICOTINE 7 MG/24HR TD PT24
7.0000 mg | MEDICATED_PATCH | Freq: Every day | TRANSDERMAL | Status: DC
  Administered 2014-12-17 – 2014-12-22 (×6): 7 mg via TRANSDERMAL
  Filled 2014-12-17 (×6): qty 1

## 2014-12-17 MED ORDER — ALUM & MAG HYDROXIDE-SIMETH 200-200-20 MG/5ML PO SUSP: 30.0000 mL | Freq: Four times a day (QID) | ORAL | Status: DC | PRN

## 2014-12-17 MED ORDER — SUCCINYLCHOLINE CHLORIDE 20 MG/ML IJ SOLN
INTRAMUSCULAR | Status: AC
  Filled 2014-12-17: qty 1

## 2014-12-17 MED ORDER — ACETAMINOPHEN 325 MG PO TABS
650.0000 mg | ORAL_TABLET | Freq: Four times a day (QID) | ORAL | Status: DC | PRN
  Administered 2014-12-17 – 2014-12-22 (×11): 650 mg via ORAL
  Filled 2014-12-17 (×11): qty 2

## 2014-12-17 MED ORDER — VANCOMYCIN HCL IN DEXTROSE 1-5 GM/200ML-% IV SOLN
1000.0000 mg | Freq: Three times a day (TID) | INTRAVENOUS | Status: DC
  Administered 2014-12-17 – 2014-12-18 (×2): 1000 mg via INTRAVENOUS
  Filled 2014-12-17 (×4): qty 200

## 2014-12-17 MED ORDER — ALPRAZOLAM 0.25 MG PO TABS: 0.2500 mg | ORAL_TABLET | Freq: Two times a day (BID) | ORAL | Status: DC

## 2014-12-17 MED ORDER — LIDOCAINE HCL (CARDIAC) 20 MG/ML IV SOLN
INTRAVENOUS | Status: AC
  Filled 2014-12-17: qty 5

## 2014-12-17 MED ORDER — CEFAZOLIN SODIUM-DEXTROSE 2-3 GM-% IV SOLR
INTRAVENOUS | Status: AC
  Filled 2014-12-17: qty 50

## 2014-12-17 MED ORDER — PENICILLIN V POTASSIUM 250 MG PO TABS
500.0000 mg | ORAL_TABLET | Freq: Once | ORAL | Status: DC
  Filled 2014-12-17: qty 2

## 2014-12-17 MED ORDER — INFLUENZA VAC SPLIT QUAD 0.5 ML IM SUSY
0.5000 mL | PREFILLED_SYRINGE | INTRAMUSCULAR | Status: AC
  Administered 2014-12-21: 0.5 mL via INTRAMUSCULAR
  Filled 2014-12-17 (×2): qty 0.5

## 2014-12-17 MED ORDER — SODIUM CHLORIDE 0.9 % IJ SOLN
3.0000 mL | Freq: Two times a day (BID) | INTRAMUSCULAR | Status: DC
  Administered 2014-12-18 – 2014-12-22 (×6): 3 mL via INTRAVENOUS

## 2014-12-17 MED ORDER — AMITRIPTYLINE HCL 10 MG PO TABS
10.0000 mg | ORAL_TABLET | Freq: Every day | ORAL | Status: DC
  Administered 2014-12-17 – 2014-12-21 (×5): 10 mg via ORAL
  Filled 2014-12-17 (×5): qty 1

## 2014-12-17 MED ORDER — SODIUM CHLORIDE 0.9 % IV SOLN: 250.0000 mL | INTRAVENOUS | Status: DC | PRN

## 2014-12-17 MED ORDER — CHLORHEXIDINE GLUCONATE 4 % EX LIQD: 60.0000 mL | Freq: Once | CUTANEOUS | Status: DC

## 2014-12-17 MED ORDER — ENOXAPARIN SODIUM 40 MG/0.4ML ~~LOC~~ SOLN
40.0000 mg | SUBCUTANEOUS | Status: DC
  Administered 2014-12-17 – 2014-12-21 (×5): 40 mg via SUBCUTANEOUS
  Filled 2014-12-17 (×5): qty 0.4

## 2014-12-17 MED ORDER — MIDAZOLAM HCL 2 MG/2ML IJ SOLN: 2.0000 mg | Freq: Once | INTRAMUSCULAR | Status: DC

## 2014-12-17 MED ORDER — MUPIROCIN 2 % EX OINT
1.0000 "application " | TOPICAL_OINTMENT | Freq: Once | CUTANEOUS | Status: AC
  Administered 2014-12-17: 1 via TOPICAL

## 2014-12-17 MED ORDER — ALPRAZOLAM 0.5 MG PO TABS: 0.5000 mg | ORAL_TABLET | Freq: Every day | ORAL | Status: DC

## 2014-12-17 NOTE — Care Management Note (Addendum)
Case Management Note  Patient Details  Name: Monique [name] MRN: [id number] Date of Birth: 10-Dec-1975  Subjective/Objective:   ED CM met with patient at bedside concerning discharge planning. Patient presented [facility] ED with complaints of facial swelling.  No PCP or medical insurance listed on patient's record.               Action/Plan:  CM met with patient at bedside. Patient confirmed information, Patient states, that she is currently not working, and will be moving in with boyfriend in [place]. Discussed [place] for follow up, patient is amendable with establishing care at the clinic, medication assistance as well. Discussed the [place] program the guidelines, patient verbalized understanding.  CM will continue to follow for discharge planning.  Expected Discharge Date:                  Expected Discharge Plan:   Home with self care, [facility], Medication assistance   In-House Referral:     Discharge planning Services   [facility]  Post Acute Care Choice:    Choice offered to:   patient  DME Arranged:    DME Agency:     HH Arranged:    HH Agency:     Status of Service:   In process will continue to follow  Medicare Important Message Given:    Date Medicare IM Given:    Medicare IM give by:    Date Additional Medicare IM Given:    Additional Medicare Important Message give by:     If discussed at [place] of Stay Meetings, dates discussed:    Additional CommentsLaurena Slimmer, RN 12/17/2014, 10:52 PM

## 2014-12-17 NOTE — ED Provider Notes (Signed)
CSN: [id number]     Arrival date & time 12/17/14  1436 History   First MD Initiated Contact with Patient 12/17/14 1503     Chief Complaint  Patient presents with  . Facial Pain     (Consider location/radiation/quality/duration/timing/severity/associated sxs/prior Treatment) HPI Comments: Patient presents to the emergency department with chief complaint of facial pain. 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 Dr. [name] send her to the emergency department for worsening pain, swelling, and some discharge on her face and chin. She states that the area of swelling started as a pimple and progressively worsened. She states the area is quite painful to palpation. She denies any dental pain. She states that the pain radiates up her jaw into her ear. She denies any associated fevers or chills. She has been taking doxycycline for her ankle.  The history is provided by the patient. No language interpreter was used.    Past Medical History  Diagnosis Date  . MRSA infection   . Bipolar disorder (HCC)   . 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 GERD (gastroesophageal reflux disease)   . Headache     migraines  . Nerve damage of right foot   . Arthritis    Past Surgical History  Procedure Laterality Date  . Tubal ligation    . Hardware removal Right 09/02/2014    Procedure: Removal Right Lateral Ankle Hardware, Place Antibiotic Bead ;  Surgeon: [name], MD;  Location: [facility] ORS;  Service: Orthopedics;  Laterality: Right;  . Dilation and curettage of uterus     Family History  Problem Relation Age of Onset  . Cancer Mother     right hip cancer    Social History  Substance Use Topics  . Smoking status: Current Every Day Smoker -- 0.50 packs/day    Types: Cigarettes  . Smokeless tobacco: Never Used  . Alcohol Use: 1.2 oz/week    2 Cans of beer per week     Comment:  " 6 months ago " 12/16/14   OB History    No data  available     Review of Systems  Constitutional: Negative for fever and chills.  HENT: Positive for facial swelling.   Respiratory: Negative for shortness of breath.   Cardiovascular: Negative for chest pain.  Gastrointestinal: Negative for nausea, vomiting, diarrhea and constipation.  Genitourinary: Negative for dysuria.  All other systems reviewed and are negative.     Allergies  Review of patient's allergies indicates no known allergies.  Home Medications   Prior to Admission medications   Medication Sig Start Date End Date Taking? Authorizing Provider  acetaminophen (TYLENOL) 325 MG tablet Take 2 tablets (650 mg total) by mouth every 6 (six) hours as needed for mild pain (or Fever >/= 101). 09/06/14  Yes Alison Murray, MD  ALPRAZolam Prudy Feeler) 0.25 MG tablet Take 0.25 mg by mouth every morning.   Yes Historical Provider, MD  ALPRAZolam Prudy Feeler) 0.5 MG tablet Take 0.5 mg by mouth 2 (two) times daily. Take 0.5mg  by mouth in the afternoon and take 0.5mg  by mouth at bedtime   Yes Historical Provider, MD  amitriptyline (ELAVIL) 10 MG tablet Take 1 tablet (10 mg total) by mouth at bedtime. 10/04/14  Yes Catarina Hartshorn, MD  Aspirin Effervescent (ALKA-SELTZER PO) Take 1 tablet by mouth as needed (for cold).   Yes Historical Provider, MD  buPROPion (WELLBUTRIN SR) 100 MG 12 hr  tablet Take 1 tablet (100 mg total) by mouth 2 (two) times daily. 10/04/14  Yes [name], MD  oxyCODONE-acetaminophen (PERCOCET/ROXICET) 5-325 MG tablet Take 1 tablet by mouth every 6 (six) hours as needed for severe pain. Patient taking differently: Take 2 tablets by mouth every 6 (six) hours as needed for severe pain.  11/25/14  Yes Jeffrey Hedges, PA-C  collagenase (SANTYL) ointment Apply topically daily. To right ankle and change dressing every 48 hours Patient not taking: Reported on 12/16/2014 10/04/14   [name], MD  doxycycline (VIBRAMYCIN) 100 MG capsule Take 1 capsule (100 mg total) by mouth 2 (two) times daily. Patient  not taking: Reported on 12/16/2014 11/25/14   Eyvonne MechanicJeffrey Hedges, PA-C  nicotine (NICODERM CQ - DOSED IN MG/24 HOURS) 21 mg/24hr patch Place 1 patch (21 mg total) onto the skin daily. Patient not taking: Reported on 12/16/2014 10/04/14   [name], MD   BP 127/73 mmHg  Pulse 74  Temp(Src) 98.8 F (37.1 C) (Oral)  Resp 16  Ht 5\' 7"  (1.702 m)  Wt 185 lb (83.915 kg)  BMI 28.97 kg/m2  SpO2 100%  LMP 12/03/2014 (Approximate) Physical Exam  Constitutional: She is oriented to person, place, and time. She appears well-developed and well-nourished.  HENT:  Head: Normocephalic and atraumatic.  Eyes: Conjunctivae and EOM are normal. Pupils are equal, round, and reactive to light.  Neck: Normal range of motion. Neck supple.  Cardiovascular: Normal rate and regular rhythm.  Exam reveals no gallop and no friction rub.   No murmur heard. Pulmonary/Chest: Effort normal and breath sounds normal. No respiratory distress. She has no wheezes. She has no rales. She exhibits no tenderness.  Abdominal: Soft. Bowel sounds are normal. She exhibits no distension and no mass. There is no tenderness. There is no rebound and no guarding.  Musculoskeletal: Normal range of motion. She exhibits no edema or tenderness.  Right ankle is moderately swollen, tender to palpation over the lateral aspect, range of motion and strength is limited secondary to pain, prior well healed surgical incisions are noted  Neurological: She is alert and oriented to person, place, and time.  Skin: Skin is warm and dry.  Psychiatric: She has a normal mood and affect. Her behavior is normal. Judgment and thought content normal.  Nursing note and vitals reviewed.   ED Course  Procedures (including critical care time) Labs Review Labs Reviewed  SURGICAL PCR SCREEN - Abnormal; Notable for the following:    Staphylococcus aureus POSITIVE (*)    All other components within normal limits  CBC - Abnormal; Notable for the following:    Hemoglobin  10.8 (*)    MCH 23.3 (*)    MCHC 29.9 (*)    RDW 16.9 (*)    Platelets 402 (*)    All other components within normal limits  COMPREHENSIVE METABOLIC PANEL - Abnormal; Notable for the following:    Sodium 133 (*)    CO2 19 (*)    Glucose, Bld 119 (*)    All other components within normal limits  APTT  PROTIME-INR  HCG, SERUM, QUALITATIVE     MDM   Final diagnoses:  Cellulitis of face    Patient with concern for worsening cellulitis of chin and face. Will start patient on vancomycin and plan for admission. Patient has previously been taking doxycycline for the infection of her ankle. Considering that her facial cellulitis has worsened despite taking the doxycycline, and taking her other comorbidities into account, it is felt the  patient would be best served by admitting.  Patient seen by and discussed with Dr. Adriana Simas, who recommends admission to medicine and starting vancomycin.  Appreciate Dr. Butler Denmark of [facility] for admitting the patient.    Roxy Horseman, PA-C 12/17/14 1620  Donnetta Hutching, MD 12/17/14 [phone]

## 2014-12-17 NOTE — Progress Notes (Signed)
Report called to ED, charge RN, Efraim KaufmannMelissa. Dr. Lajoyce Cornersuda spoke with pt regarding cancellation due to red, swollen area to right, lower chin. Pt verbalized understanding.

## 2014-12-17 NOTE — Anesthesia Preprocedure Evaluation (Deleted)
Anesthesia Evaluation  Patient identified by MRN, date of birth, ID band Patient awake    Reviewed: Allergy & Precautions, NPO status , Patient's Chart, lab work & pertinent test results  Airway        Dental   Pulmonary Current Smoker,           Cardiovascular      Neuro/Psych Bipolar Disorder    GI/Hepatic (+)     substance abuse (polysubstance abuse ongoing)  alcohol use, cocaine use and marijuana use, Hepatitis - (HCV ), C  Endo/Other    Renal/GU      Musculoskeletal   Abdominal   Peds  Hematology  (+) anemia ,   Anesthesia Other Findings Patient has hard red swollen area right  mandibular area.  Denies dental pain.  Dentition is abominable. Dr. Lajoyce Cornersuda and Dr Yvetta Coderssy have decided to delay the procedure and have the patient see a dentist to r/o this as an abcess or nidus for infection.  Reproductive/Obstetrics                             Anesthesia Physical Anesthesia Plan Anesthesia Quick Evaluation

## 2014-12-17 NOTE — ED Notes (Signed)
Provider at bedside discussing plan of care with patient.

## 2014-12-17 NOTE — ED Notes (Signed)
Admit Doctor at bedside.  

## 2014-12-17 NOTE — H&P (Addendum)
[facility] History and Physical  Monique [name]:[id number] DOB: 09/17/1975 DOA: 12/17/2014   PCP: [name] Patient    Chief Complaint: swelling in face  HPI: Monique [name] is a 39 y.o. 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 The patient was seen by Dr. [name] prior to going to the OR today and found to have a cellulitis of her face. Surgery was postponed and the patient was sent down to the ER. She has been on doxycycline for the osteomyelitis but the course was finished a few days ago. She noted a pimple on her chin about a week ago and the chin subsequently become more swollen. A couple of days ago, the swelling spread to the left side of her face. She is having a significant amount of pain in her left mandibular area which is radiating to her ear. No c/o tooth ache or cavities. She states that she is still able to express pus from the original pimple on her chin. No c/o fever or chills.    General: The patient denies anorexia, fever, + weight gain, + sinus pressure and clear drainage from nose Cardiac: Denies chest pain, syncope, palpitations, pedal edema  Respiratory: Denies shortness of breath, wheezing + cough with dark green mucous GI: Deniesabdominal pain,vomiting, diarrhea and constipation + heartburn and nausea GU: Denies hematuria, incontinence, dysuria  Musculoskeletal: Denies arthritis  Skin: blisters on right wrist Neurologic: Denies focal weakness or numbness, change in vision Psychiatry: Denies depression or anxiety. Hematologic: no easy bruising or bleeding  All other systems reviewed and found to be negative.  Past Medical History  Diagnosis Date  . MRSA infection   . Bipolar disorder (HCC)   . 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 GERD (gastroesophageal reflux disease)   . Headache     migraines  . Nerve damage of right foot   . Arthritis     Past Surgical History  Procedure Laterality Date  . Tubal  ligation    . Hardware removal Right 09/02/2014    Procedure: Removal Right Lateral Ankle Hardware, Place Antibiotic Bead ;  Surgeon: [name], MD;  Location: [facility];  Service: Orthopedics;  Laterality: Right;  . 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 Cancer Mother     right hip cancer      Prior to Admission medications   Medication Sig Start Date End Date Taking? Authorizing Provider  acetaminophen (TYLENOL) 325 MG tablet Take 2 tablets (650 mg total) by mouth every 6 (six) hours as needed for mild pain (or Fever >/= 101). 09/06/14  Yes Alison Murray, MD  ALPRAZolam Prudy Feeler) 0.25 MG tablet Take 0.25 mg by mouth every morning.   Yes Historical Provider, MD  ALPRAZolam Prudy Feeler) 0.5 MG tablet Take 0.5 mg by mouth 2 (two) times daily. Take 0.5mg  by mouth in the afternoon and take 0.5mg  by mouth at bedtime   Yes Historical Provider, MD  amitriptyline (ELAVIL) 10 MG tablet Take 1 tablet (10 mg total) by mouth at bedtime. 10/04/14  Yes Catarina Hartshorn, MD  Aspirin Effervescent (ALKA-SELTZER PO) Take 1 tablet by mouth as needed (for cold).   Yes Historical Provider, MD  buPROPion [facility] SR)  100 MG 12 hr tablet Take 1 tablet (100 mg total) by mouth 2 (two) times daily. 10/04/14  Yes Catarina Hartshorn, MD  oxyCODONE-acetaminophen (PERCOCET/ROXICET) 5-325 MG tablet Take 1 tablet by mouth every 6 (six) hours as needed for severe pain. Patient taking differently: Take 2 tablets by mouth every 6 (six) hours as needed for severe pain.  11/25/14  Yes Jeffrey Hedges, PA-C  collagenase (SANTYL) ointment Apply topically daily. To right ankle and change dressing every 48 hours Patient not taking: Reported on 12/16/2014 10/04/14   Catarina Hartshorn, MD  doxycycline (VIBRAMYCIN) 100 MG capsule Take 1 capsule (100  mg total) by mouth 2 (two) times daily. Patient not taking: Reported on 12/16/2014 11/25/14   Eyvonne Mechanic, PA-C  nicotine (NICODERM CQ - DOSED IN MG/24 HOURS) 21 mg/24hr patch Place 1 patch (21 mg total) onto the skin daily. Patient not taking: Reported on 12/16/2014 10/04/14   Catarina Hartshorn, MD     Physical Exam: Filed Vitals:   12/17/14 1545 12/17/14 1600 12/17/14 1630 12/17/14 1645  BP: 118/75 130/68 131/75 131/81  Pulse: 79 95 92 85  Temp:      TempSrc:      Resp: Height:      Weight:      SpO2: 98% 100% 100% 100%     General: AAO x 3, no acute distress HEENT: Normocephalic and Atraumatic, Mucous membranes pink-has acne on face, has swelling, tenderness and erythema on chin extending to left mandibular area almost as far as her ear.                 PERRLA; EOM intact; No scleral icterus,                 Nares: Patent, Oropharynx: Clear, Fair Dentition                 Neck: FROM, no cervical lymphadenopathy, thyromegaly, carotid bruit or JVD;  Breasts: deferred CHEST WALL: No tenderness  CHEST: Normal respiration, clear to auscultation bilaterally  HEART: Regular rate and rhythm; no murmurs rubs or gallops  BACK: No kyphosis or scoliosis; no CVA tenderness  GI: Positive Bowel Sounds, soft, non-tender; no masses, no organomegaly Rectal Exam: deferred MSK: No cyanosis, clubbing, or edema Genitalia: not examined  SKIN:  no rash or ulceration  CNS: Alert and Oriented x 4, Nonfocal exam, CN 2-12 intact  Labs on Admission:  Basic Metabolic Panel:  Recent Labs Lab 12/17/14 1150  NA 133*  K 4.2  CL 105  CO2 19*  GLUCOSE 119*  BUN 14  CREATININE 0.57  CALCIUM 9.0   Liver Function Tests:  Recent Labs Lab 12/17/14 1150  AST 18  ALT 17  ALKPHOS 57  BILITOT 0.4  PROT 7.2  ALBUMIN 3.8   No results for input(s): LIPASE, AMYLASE in the last 168 hours. No results for input(s): AMMONIA in the last 168 hours. CBC:  Recent Labs Lab 12/17/14 1150  WBC  10.2  HGB 10.8*  HCT 36.1  MCV 78.0  PLT 402*   Cardiac Enzymes: No results for input(s): CKTOTAL, CKMB, CKMBINDEX, TROPONINI in the last 168 hours.  BNP (last 3 results) No results for input(s): BNP in the last 8760 hours.  ProBNP (last 3 results) No results for input(s): PROBNP in the last 8760 hours.  CBG: No results for input(s): GLUCAP in the last 168 hours.  Radiological Exams on Admission: No results found.   Assessment/Plan Principal Problem:   Cellulitis  and abscess of face - appears that the infection worsened after Doxycycline was discontinued- cont Vancomycin - she will not allow me to touch her to obtain a sample for culture - I have asked ENT to evaluate for I & D- Dr Jenne PaneBates plans on seeing her tomorrow - blood cultures not obtained in ER- will hold off on them at this point  Active Problems: Acute bronchitis with acute sinus congestion - cough with green sputum for about 1 week- viral? - follow    Osteomyelitis of right ankle  - per Ortho service    Nicotine abuse - have ordered a nicotine patch and counseled her     Anemia, chronic disease -stable  Anxiety - cont Xanax, Elavil and Wellbutrin   Consulted:   Code Status: Full code  Family Communication:   DVT Prophylaxis:Lovenox  Time spent: 50 min  Talor Desrosiers, MD [facility]  If 7PM-7AM, please contact night-coverage [email or web address] 12/17/2014, 5:11 PM

## 2014-12-17 NOTE — Progress Notes (Signed)
This nurse called Dr. Michelle Piperssey regarding need for chest xray due to runny nose and productive cough but continues to deny fever or worsening shortness of breath, no need for chest xray per him. Also reports that pt does not need UDS.

## 2014-12-17 NOTE — Consult Note (Signed)
Reason for Consult:Right chin infection Referring Physician: Hospitalist  Monique [name] is an 39 y.o. female.  HPI: 39 year old female presented for surgery on her right ankle by Dr. Sharol Given today for osteomyelitis.  At preop, she reported that she has had swelling of her chin that started with a pimple about one week ago and swelling really increased around that over the past 2-3 days.  She has been able to express thick drainage at times.  Surgery was cancelled and she was admitted through the ER for management of this infection.  She was started on vancomycin.  She completed a two week course of doxycycline a few days ago.  Past Medical History  Diagnosis Date  . MRSA infection   . Bipolar disorder ([place])   . Osteomyelitis of ankle ([place])     right  . GERD (gastroesophageal reflux disease)   . Headache     migraines  . Nerve damage of right foot   . Arthritis     Past Surgical History  Procedure Laterality Date  . Tubal ligation    . Hardware removal Right 09/02/2014    Procedure: Removal Right Lateral Ankle Hardware, Place Antibiotic Bead ;  Surgeon: Newt Minion, MD;  Location: [facility];  Service: Orthopedics;  Laterality: Right;  . Dilation and curettage of uterus      Family History  Problem Relation Age of Onset  . Cancer Mother     right hip cancer     Social History:  reports that she has been smoking Cigarettes.  She has been smoking about 0.50 packs per day. She has never used smokeless tobacco. She reports that she drinks about 1.2 oz of alcohol per week. She reports that she uses illicit drugs (Cocaine, Marijuana, and Benzodiazepines).  Allergies: No Known Allergies  Medications: I have reviewed the patient's current medications.  Results for orders placed or performed during the hospital encounter of 12/17/14 (from the past 48 hour(s))  APTT     Status: None   Collection Time: 12/17/14 11:50 AM  Result Value Ref Range   aPTT 29 24 - 37 seconds  CBC     Status:  Abnormal   Collection Time: 12/17/14 11:50 AM  Result Value Ref Range   WBC 10.2 4.0 - 10.5 K/uL   RBC 4.63 3.87 - 5.11 MIL/uL   Hemoglobin 10.8 (L) 12.0 - 15.0 g/dL   HCT 36.1 36.0 - 46.0 %   MCV 78.0 78.0 - 100.0 fL   MCH 23.3 (L) 26.0 - 34.0 pg   MCHC 29.9 (L) 30.0 - 36.0 g/dL   RDW 16.9 (H) 11.5 - 15.5 %   Platelets 402 (H) 150 - 400 K/uL  Comprehensive metabolic panel     Status: Abnormal   Collection Time: 12/17/14 11:50 AM  Result Value Ref Range   Sodium 133 (L) 135 - 145 mmol/L   Potassium 4.2 3.5 - 5.1 mmol/L   Chloride 105 101 - 111 mmol/L   CO2 19 (L) 22 - 32 mmol/L   Glucose, Bld 119 (H) 65 - 99 mg/dL   BUN 14 6 - 20 mg/dL   Creatinine, Ser 0.57 0.44 - 1.00 mg/dL   Calcium 9.0 8.9 - 10.3 mg/dL   Total Protein 7.2 6.5 - 8.1 g/dL   Albumin 3.8 3.5 - 5.0 g/dL   AST 18 15 - 41 U/L   ALT 17 14 - 54 U/L   Alkaline Phosphatase 57 38 - 126 U/L   Total  Bilirubin 0.4 0.3 - 1.2 mg/dL   GFR calc non Af Amer >60 >60 mL/min   GFR calc Af Amer >60 >60 mL/min    Comment: (NOTE) The eGFR has been calculated using the CKD EPI equation. This calculation has not been validated in all clinical situations. eGFR's persistently <60 mL/min signify possible Chronic Kidney Disease.    Anion gap 9 5 - 15  Protime-INR     Status: None   Collection Time: 12/17/14 11:50 AM  Result Value Ref Range   Prothrombin Time 13.3 11.6 - 15.2 seconds   INR 0.99 0.00 - 1.49  hCG, serum, qualitative     Status: None   Collection Time: 12/17/14 11:50 AM  Result Value Ref Range   Preg, Serum NEGATIVE NEGATIVE    Comment:        THE SENSITIVITY OF THIS METHODOLOGY IS >10 mIU/mL.   Surgical pcr screen     Status: Abnormal   Collection Time: 12/17/14  1:00 PM  Result Value Ref Range   MRSA, PCR NEGATIVE NEGATIVE   Staphylococcus aureus POSITIVE (A) NEGATIVE    Comment:        The Xpert SA Assay (FDA approved for NASAL specimens in patients over 40 years of age), is one component of a  comprehensive surveillance program.  Test performance has been validated by [facility] for patients greater than or equal to 71 year old. It is not intended to diagnose infection nor to guide or monitor treatment.     No results found.  Review of Systems  All other systems reviewed and are negative.  Blood pressure 130/62, pulse 85, temperature 98 F (36.7 C), temperature source Oral, resp. rate 18, height _0  (1.702 m), weight 83.915 kg (185 lb), last menstrual period 12/03/2014, SpO2 100 %. Physical Exam  Constitutional: She is oriented to person, place, and time. She appears well-developed and well-nourished. No distress.  HENT:  Head: Normocephalic and atraumatic.  Right Ear: External ear normal.  Left Ear: External ear normal.  Nose: Nose normal.  Mouth/Throat: Oropharynx is clear and moist.  Right chin with scab overlying a small firm area, no fluctuance, very tender, soft edema extends from there toward the right angle of mandible.  Eyes: Conjunctivae and EOM are normal. Pupils are equal, round, and reactive to light.  Neck: Normal range of motion. Neck supple.  Cardiovascular: Normal rate.   Respiratory: Effort normal.  Musculoskeletal: Normal range of motion.  Neurological: She is alert and oriented to person, place, and time. No cranial nerve deficit.  Skin: Skin is warm and dry.  Psychiatric: She has a normal mood and affect. Her behavior is normal. Judgment and thought content normal.    Assessment/Plan: Right chin infection, possibly MRSA The chin infection does not have a very significant abscess pocket on exam and is draining.  I do not feel that I&D is needed at present but will have nursing help her apply warm compresses around the clock and she will continue to try to express drainage out of the area.  If possible, some can be sent for culture.  I agree with vancomycin therapy.  Will follow.  Monique [name] 12/17/2014, 6:32 PM

## 2014-12-17 NOTE — ED Notes (Signed)
Onset one week ago attempted to pop a pimple right side of chin and developed swelling chin and right side of face worsening overtime.  Skin light pink pain 7/10 achy dull throbbing. Airway intact bilateral equal chest rise and fall.  Sent from Short stay was scheduled to have right ankle surgery with Dr Lajoyce Cornersuda.

## 2014-12-17 NOTE — ED Notes (Signed)
After medication administraton of morphine developed light pink rash at IV site and itching. Provider notified. Denies shortness of breath airway intact bilateral equal chest rise and fall.

## 2014-12-17 NOTE — H&P (Signed)
Monique [name] is an 39 y.o. female.   Chief Complaint: Abscess osteomyelitis right ankle HPI: Patient is a 39 year old woman who is status post previous internal fixation for ankle fracture and subsequent removal of the hardware and placement of antibiotic beads. Patient has developed recurrent infection and presents at this time for excision of the infected bone soft tissue and placement of external fixation for stabilization for ankle fusion.  Past Medical History  Diagnosis Date  . MRSA infection   . Bipolar disorder ([facility])   . 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 GERD (gastroesophageal reflux disease)   . Headache     migraines  . Nerve damage of right foot   . Arthritis     Past Surgical History  Procedure Laterality Date  . Tubal ligation    . Hardware removal Right 09/02/2014    Procedure: Removal Right Lateral Ankle Hardware, Place Antibiotic Bead ;  Surgeon: Nadara MustardMarcus Louna Rothgeb V, MD;  Location: [facility];  Service: Orthopedics;  Laterality: Right;  . Dilation and curettage of uterus      Family History  Problem Relation Age of Onset  . Cancer Mother     right hip cancer    Social History:  reports that she has been smoking Cigarettes.  She has been smoking about 0.50 packs per day. She has never used smokeless tobacco. She reports that she drinks about 1.2 oz of alcohol per week. She reports that she uses illicit drugs (Cocaine, Marijuana, and Benzodiazepines).  Allergies: No Known Allergies  No prescriptions prior to admission    No results found for this or any previous visit (from the past 48 hour(s)). No results found.  Review of Systems  All other systems reviewed and are negative.   Last menstrual period 12/03/2014. Physical Exam   On examination patient has a good pulse. Radiographs and MRI scan shows destructive changes consistent with osteomyelitis and abscess of the right ankle. Assessment/Plan Assessment: Abscess osteomyelitis right ankle.  Plan:  Excision and fusion of the right ankle with placement of external fixation. Risks and benefits were discussed including risk of persistent infection. Patient states she understands wishes to proceed at this time.  Mckinnley Cottier V 12/17/2014, 6:49 AM

## 2014-12-17 NOTE — Progress Notes (Addendum)
ANTIBIOTIC CONSULT NOTE - INITIAL  Pharmacy Consult for vancomycin Indication: cellulitis of face  No Known Allergies  Patient Measurements: Height: 5\' 7"  (170.2 cm) Weight: 185 lb (83.915 kg) IBW/kg (Calculated) : 61.6  Vital Signs: Temp: 98.8 F (37.1 C) (10/21 1445) Temp Source: Oral (10/21 1445) BP: 127/73 mmHg (10/21 1445) Pulse Rate: 74 (10/21 1445) Intake/Output from previous day:   Intake/Output from this shift:    Labs:  Recent Labs  12/17/14 1150  WBC 10.2  HGB 10.8*  PLT 402*  CREATININE 0.57   Estimated Creatinine Clearance: 106.1 mL/min (by C-G formula based on Cr of 0.57). No results for input(s): VANCOTROUGH, VANCOPEAK, VANCORANDOM, GENTTROUGH, GENTPEAK, GENTRANDOM, TOBRATROUGH, TOBRAPEAK, TOBRARND, AMIKACINPEAK, AMIKACINTROU, AMIKACIN in the last 72 hours.   Microbiology: Recent Results (from the past 720 hour(s))  Surgical pcr screen     Status: Abnormal   Collection Time: 12/17/14  1:00 PM  Result Value Ref Range Status   MRSA, PCR NEGATIVE NEGATIVE Final   Staphylococcus aureus POSITIVE (A) NEGATIVE Final    Comment:        The Xpert SA Assay (FDA approved for NASAL specimens in patients over 39 years of age), is one component of a comprehensive surveillance program.  Test performance has been validated by [facility] for patients greater than or equal to 39 year old. It is not intended to diagnose infection nor to guide or monitor treatment.     Medical History: Past Medical History  Diagnosis Date  . MRSA infection   . Bipolar disorder ([facility])   . 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 GERD (gastroesophageal reflux disease)   . Headache     migraines  . Nerve damage of right foot   . Arthritis     Medications:   (Not in a hospital admission) Assessment: [age] yoF s/p previous internal fixation for right ankle fracture and subsequent removal of hardware and placement of antibiotic beads. She presented to orthopedic surgeon  today for excision of infected bone and soft tissue due to recurrent infection. Her surgery was cancelled and she was sent to the ER due to pt experiencing facial pain and discharge from face/chin. Pharmacy consulted to dose vancomycin for cellulitis.  MRSA PCR positive, WBC 10.2, afebrile, SCr 0.57, CrCl >100  Goal of Therapy:  Vancomycin trough level 10-15 mcg/ml  Plan:  Vancomycin 2000 mg IV x1 load Vancomycin 1000 mg IV q8h Expected duration 5 days with resolution of temperature and/or normalization of WBC Measure antibiotic drug levels at steady state Follow up culture results  F/U renal fxn, LOT, clinical improvement  Arcola JanskyMeagan Trigger Frasier, PharmD Clinical Pharmacy Resident Pager: [phone] 12/17/2014,3:36 PM

## 2014-12-18 DIAGNOSIS — M869 Osteomyelitis, unspecified: Secondary | ICD-10-CM

## 2014-12-18 DIAGNOSIS — D638 Anemia in other chronic diseases classified elsewhere: Secondary | ICD-10-CM

## 2014-12-18 LAB — CBC
HCT: 33.7 % — ABNORMAL LOW (ref 36.0–46.0)
HEMOGLOBIN: 10.1 g/dL — AB (ref 12.0–15.0)
MCH: 23.7 pg — AB (ref 26.0–34.0)
MCHC: 30 g/dL (ref 30.0–36.0)
MCV: 78.9 fL (ref 78.0–100.0)
Platelets: 357 10*3/uL (ref 150–400)
RBC: 4.27 MIL/uL (ref 3.87–5.11)
RDW: 17 % — ABNORMAL HIGH (ref 11.5–15.5)
WBC: 10.9 10*3/uL — ABNORMAL HIGH (ref 4.0–10.5)

## 2014-12-18 LAB — BASIC METABOLIC PANEL
ANION GAP: 9 (ref 5–15)
BUN: 12 mg/dL (ref 6–20)
CHLORIDE: 105 mmol/L (ref 101–111)
CO2: 21 mmol/L — AB (ref 22–32)
CREATININE: 0.56 mg/dL (ref 0.44–1.00)
Calcium: 9 mg/dL (ref 8.9–10.3)
GFR calc non Af Amer: >60 mL/min (ref >60)
GLUCOSE: 133 mg/dL — AB (ref 65–99)
Potassium: 4 mmol/L (ref 3.5–5.1)
Sodium: 135 mmol/L (ref 135–145)

## 2014-12-18 MED ORDER — VANCOMYCIN HCL 10 G IV SOLR
1500.0000 mg | Freq: Two times a day (BID) | INTRAVENOUS | Status: DC
  Administered 2014-12-18 – 2014-12-22 (×8): 1500 mg via INTRAVENOUS
  Filled 2014-12-18 (×13): qty 1500

## 2014-12-18 MED ORDER — OXYCODONE HCL 5 MG PO TABS
5.0000 mg | ORAL_TABLET | Freq: Four times a day (QID) | ORAL | Status: DC | PRN
  Administered 2014-12-18 – 2014-12-22 (×14): 10 mg via ORAL
  Filled 2014-12-18 (×17): qty 2

## 2014-12-18 NOTE — Progress Notes (Signed)
[facility]                                                                              Patient Demographics  Monique [name], is a 39 y.o. female, DOB - 07/21/1975, WUJ:[id number]  Admit date - 12/17/2014   Admitting Physician Calvert Cantor, MD  Outpatient Primary MD for the patient is [name] Patient  LOS - 1   Chief Complaint  Patient presents with  . Facial Pain      HPI on 12/17/2014 by Dr. Calvert Cantor Monique [name] is a 39 y.o. 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 The patient was seen by Dr. [name] prior to going to the OR today and found to have a cellulitis of her face. Surgery was postponed and the patient was sent down to the ER. She has been on doxycycline for the osteomyelitis but the course was finished a few days ago. She noted a pimple on her chin about a week ago and the chin subsequently become more swollen. A couple of days ago, the swelling spread to the left side of her face. She is having a significant amount of pain in her left mandibular area which is radiating to her ear. No c/o tooth ache or cavities. She states that she is still able to express pus from the original pimple on her chin. No c/o fever or chills.   Assessment & Plan   Facial cellulitis and abscess -Patient was on doxycycline as an outpatient, currently on vancomycin -ENT, Dr. Jenne Pane consulted and appreciated- continue IV antibiotics, warm compresses, no need for I&D at this time -Continue pain control  Acute bronchitis -Likely viral, will continue to follow  Right ankle osteomyelitis -Patient was scheduled for surgery with Dr. [name], on 10/21, however this was canceled due to facial cellulitis  Nicotine abuse -Discussed smoking cessation -Continue nicotine patch  Anemia of chronic disease -baseline Hemoglobin9-10 ,currently10.1 -Continue to monitor CBC  Anxiety -Continue Xanax, Wellbutrin and Elavil  Code Status: Full  Family  Communication: None at bedside  Disposition Plan: Admitted  Time Spent in minutes   30 minutes  Procedures  None  Consults   ENT, Dr. Jenne Pane  DVT Prophylaxis  Lovenox  Lab Results  Component Value Date   PLT 357 12/18/2014    Medications  Scheduled Meds: . ALPRAZolam  0.25 mg Oral BID  . ALPRAZolam  0.5 mg Oral QHS  . amitriptyline  10 mg Oral QHS  . buPROPion  100 mg Oral BID  . enoxaparin (LOVENOX) injection  40 mg Subcutaneous Q24H  . fentaNYL (SUBLIMAZE) injection  100 mcg Intravenous Once  . Influenza vac split quadrivalent PF  0.5 mL Intramuscular Tomorrow-1000  . midazolam  2 mg Intravenous Once  . nicotine  7 mg Transdermal Daily  . sodium chloride  3 mL Intravenous Q12H  . vancomycin  1,500 mg Intravenous Q12H   Continuous Infusions: . lactated ringers 10 mL/hr at 12/17/14 1216   PRN Meds:.sodium chloride, acetaminophen **OR** acetaminophen, alum & mag hydroxide-simeth, diphenhydrAMINE, morphine injection, ondansetron **OR** ondansetron (ZOFRAN) IV, oxyCODONE-acetaminophen, senna-docusate, sodium chloride  Antibiotics    Anti-infectives    Start  Dose/Rate Route Frequency Ordered Stop   12/18/14 2000  vancomycin (VANCOCIN) 1,500 mg in sodium chloride 0.9 % 500 mL IVPB     1,500 mg 250 mL/hr over 120 Minutes Intravenous Every 12 hours 12/18/14 1116     12/18/14 0000  vancomycin (VANCOCIN) IVPB 1000 mg/200 mL premix  Status:  Discontinued     1,000 mg 200 mL/hr over 60 Minutes Intravenous Every 8 hours 12/17/14 1544 12/18/14 1116   12/17/14 1600  vancomycin (VANCOCIN) 2,000 mg in sodium chloride 0.9 % 500 mL IVPB     2,000 mg 250 mL/hr over 120 Minutes Intravenous  Once 12/17/14 1542 12/17/14 1834   12/17/14 1530  penicillin v potassium (VEETID) tablet 500 mg  Status:  Discontinued     500 mg Oral  Once 12/17/14 1519 12/17/14 1534   12/17/14 1149  ceFAZolin (ANCEF) 2-3 GM-% IVPB SOLR    Comments:  Girtha HakeKensmoe, Katherine  : cabinet override      12/17/14  1149 12/17/14 2359   12/17/14 1136  ceFAZolin (ANCEF) IVPB 2 g/50 mL premix  Status:  Discontinued     2 g 100 mL/hr over 30 Minutes Intravenous On call to O.R. 12/17/14 1136 12/17/14 1436        Subjective:   Monique [name] seen and examined today.  Patient continues to complain of pain right side of her face and neck. Feels that the swelling has worsened and does not feel heating packs last long enough. Also complains that she is continuously having pain, does not feel her pain is well controlled. Denies any chest pain, shortness of breath, abdominal pain.   Objective:   Filed Vitals:   12/17/14 1804 12/17/14 2050 12/18/14 0412 12/18/14 0811  BP: 130/62 132/71 126/72 131/72  Pulse: 85 85 82 81  Temp: 98 F (36.7 C) 98.7 F (37.1 C) 98.9 F (37.2 C) 98.7 F (37.1 C)  TempSrc:    Oral  Resp: 18 18 20 18   Height:      Weight:      SpO2: 100% 99% 99% 100%    Wt Readings from Last 3 Encounters:  12/17/14 83.915 kg (185 lb)  11/25/14 81.647 kg (180 lb)  09/28/14 81.647 kg (180 lb)     Intake/Output Summary (Last 24 hours) at 12/18/14 1151 Last data filed at 12/18/14 1138  Gross per 24 hour  Intake 1935.33 ml  Output    500 ml  Net 1435.33 ml    Exam  General: Well developed, well nourished, NAD, appears stated age  HEENT: NCAT, mucous membranes moist.  Swelling and tenderness with erythema noted on the right maxillary/mandibular area extending to the chin  Cardiovascular: S1 S2 auscultated, no rubs, murmurs or gallops. Regular rate and rhythm.  Respiratory: Clear to auscultation bilaterally with equal chest rise  Abdomen: Soft, nontender, nondistended, + bowel sounds  Extremities: warm dry without cyanosis clubbing or edema  Neuro: AAOx3, nonfocal  Psych: Normal affect and demeanor with intact judgement and insight  Data Review   Micro Results Recent Results (from the past 240 hour(s))  Surgical pcr screen     Status: Abnormal   Collection Time:  12/17/14  1:00 PM  Result Value Ref Range Status   MRSA, PCR NEGATIVE NEGATIVE Final   Staphylococcus aureus POSITIVE (A) NEGATIVE Final    Comment:        The Xpert SA Assay (FDA approved for NASAL specimens in patients over 39 years of age), is one component of a comprehensive surveillance  program.  Test performance has been validated by [facility] for patients greater than or equal to 90 year old. It is not intended to diagnose infection nor to guide or monitor treatment.     Radiology Reports Dg Ankle Complete Right  11/25/2014  CLINICAL DATA:  Chronic ankle fracture. Pain and swelling. Hardware removed 1 month ago. EXAM: RIGHT ANKLE - COMPLETE 3+ VIEW COMPARISON:  09/28/2014 FINDINGS: Bimalleolar ankle fracture has healed. Hardware removal with the exception of a single screw overlying the fibula is unchanged from the prior study. There is diffuse soft tissue swelling. There is mild joint space narrowing in the ankle joint with joint effusion. There is a large area of mixed sclerotic and lytic change in the distal tibia extending to the tibial plafond and which has progressed in appearance from the prior study. This may be an osteochondral fragment. This is difficult to identify on the prior MRI of 08/30/2014 due to extensive artifact from hardware. The hardware has been removed in the interval and follow-up MRI may be helpful. IMPRESSION: Postop bimalleolar ankle fracture with hardware removal. There is a large joint effusion and diffuse soft tissue swelling Large defect in the distal tibia could represent osteochondral defect. Consider MRI for further evaluation. Electronically Signed   By: Marlan Palau M.D.   On: 11/25/2014 11:55   Mr Ankle Right W Wo Contrast  11/25/2014  CLINICAL DATA:  Infected hardware and chronic septic arthritis. Hardware removed from RIGHT ankle. RIGHT ankle pain. Postoperative infection last year. EXAM: MRI OF THE RIGHT ANKLE WITHOUT AND WITH CONTRAST  TECHNIQUE: Multiplanar, multisequence MR imaging of the ankle was performed before and after the administration of intravenous contrast. CONTRAST:  20mL MULTIHANCE GADOBENATE DIMEGLUMINE 529 MG/ML IV SOLN COMPARISON:  None. FINDINGS: Interfragmentary screw in the distal fibula remains present with the attendant artifact. Florid inflammatory changes are present around the ankle. 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 There is a large area of nonenhancing sclerotic bone in the central tibial plafond. 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 Diffuse enhancing synovitis of the ankle joint is present. There is enhancing granulation tissue in the screw tracts along the medial malleolus and at the site of the removed syndesmotic screws. Ankle effusion and periarticular phlegmon is present. There is subchondral sclerosis and partial collapse of the talar dome centrally. This may be secondary to AVN or infection or combination of the 2. Subtalar effusion is present but no convincing evidence of subtalar septic arthritis. Midfoot shows heterogeneous marrow signal likely secondary to disuse osteopenia. Grossly, the posterior medial, posterior lateral and anterior tendons are intact. The Achilles tendon is intact. Plantar fascia is normal. 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 Septic joint effusion and periarticular phlegmon. 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 No soft tissue abscess. The enhancement along the removed hardware tract may represent granulation tissue but given the septic arthritis, could also represent other sites of developing osteomyelitis. Electronically Signed   By: Andreas Newport M.D.   On: 11/25/2014 15:53    CBC  Recent Labs Lab 12/17/14 1150 12/18/14 0322  WBC 10.2  10.9*  HGB 10.8* 10.1*  HCT 36.1 33.7*  PLT 402* 357  MCV 78.0 78.9  MCH 23.3* 23.7*  MCHC 29.9* 30.0  RDW 16.9* 17.0*    Chemistries   Recent  Labs Lab 12/17/14 1150 12/18/14 0322  NA 133* 135  K 4.2 4.0  CL 105 105  CO2 19* 21*  GLUCOSE 119* 133*  BUN 14 12  CREATININE 0.57 0.56  CALCIUM 9.0 9.0  AST 18  --   ALT 17  --   ALKPHOS 57  --   BILITOT 0.4  --    ------------------------------------------------------------------------------------------------------------------ estimated creatinine clearance is 106.1 mL/min (by C-G formula based on Cr of 0.56). ------------------------------------------------------------------------------------------------------------------ No results for input(s): HGBA1C in the last 72 hours. ------------------------------------------------------------------------------------------------------------------ No results for input(s): CHOL, HDL, LDLCALC, TRIG, CHOLHDL, LDLDIRECT in the last 72 hours. ------------------------------------------------------------------------------------------------------------------ No results for input(s): TSH, T4TOTAL, T3FREE, THYROIDAB in the last 72 hours.  Invalid input(s): FREET3 ------------------------------------------------------------------------------------------------------------------ No results for input(s): VITAMINB12, FOLATE, FERRITIN, TIBC, IRON, RETICCTPCT in the last 72 hours.  Coagulation profile  Recent Labs Lab 12/17/14 1150  INR 0.99    No results for input(s): DDIMER in the last 72 hours.  Cardiac Enzymes No results for input(s): CKMB, TROPONINI, MYOGLOBIN in the last 168 hours.  Invalid input(s): CK ------------------------------------------------------------------------------------------------------------------ Invalid input(s): POCBNP    Kelse Ploch D.O. on 12/18/2014 at 11:51 AM  Between 7am to 7pm - Pager - [phone]  After 7pm go to [email or web address] - password  [email or web address]  And look for the night coverage person covering for me after hours  [facility] Office  [phone]

## 2014-12-18 NOTE — Progress Notes (Signed)
Patient instructed to keep kpad on for no more than 10 minutes at a time & to keep towel between face & pad.  Patient also instructed not to use while sleeping.

## 2014-12-18 NOTE — Progress Notes (Signed)
1 Day Post-Op  Subjective: Pain remains pretty severe in the right chin and lower cheek.  Swelling has worsened.  She is unable to express anything from wound.  Objective: Vital signs in last 24 hours: Temp:  [98 F (36.7 C)-98.9 F (37.2 C)] 98.7 F (37.1 C) (10/22 0811) Pulse Rate:  [74-100] 81 (10/22 0811) Resp:  [16-20] 18 (10/22 0811) BP: (102-136)/(62-81) 131/72 mmHg (10/22 0811) SpO2:  [96 %-100 %] 100 % (10/22 0811) Weight:  [83.915 kg (185 lb)] 83.915 kg (185 lb) (10/21 1445) Last BM Date: 12/17/14  Intake/Output from previous day: 10/21 0701 - 10/22 0700 In: 1097.3 [P.O.:720; I.V.:177.3; IV Piggyback:200] Out: -  Intake/Output this shift: Total I/O In: 480 [P.O.:480] Out: 200 [Urine:200]  General appearance: alert, cooperative and no distress Head: Right chin wound dry, firm deep to wound but not fluctuant, chin quite tender, edema into right lower cheek worse but still soft  Lab Results:   Recent Labs  12/17/14 1150 12/18/14 0322  WBC 10.2 10.9*  HGB 10.8* 10.1*  HCT 36.1 33.7*  PLT 402* 357   BMET  Recent Labs  12/17/14 1150 12/18/14 0322  NA 133* 135  K 4.2 4.0  CL 105 105  CO2 19* 21*  GLUCOSE 119* 133*  BUN 14 12  CREATININE 0.57 0.56  CALCIUM 9.0 9.0   PT/INR  Recent Labs  12/17/14 1150  LABPROT 13.3  INR 0.99   ABG No results for input(s): PHART, HCO3 in the last 72 hours.  Invalid input(s): PCO2, PO2  Studies/Results: No results found.  Anti-infectives: Anti-infectives    Start     Dose/Rate Route Frequency Ordered Stop   12/18/14 0000  vancomycin (VANCOCIN) IVPB 1000 mg/200 mL premix     1,000 mg 200 mL/hr over 60 Minutes Intravenous Every 8 hours 12/17/14 1544     12/17/14 1600  vancomycin (VANCOCIN) 2,000 mg in sodium chloride 0.9 % 500 mL IVPB     2,000 mg 250 mL/hr over 120 Minutes Intravenous  Once 12/17/14 1542 12/17/14 1834   12/17/14 1530  penicillin v potassium (VEETID) tablet 500 mg  Status:  Discontinued      500 mg Oral  Once 12/17/14 1519 12/17/14 1534   12/17/14 1149  ceFAZolin (ANCEF) 2-3 GM-% IVPB SOLR    Comments:  Girtha HakeKensmoe, Katherine  : cabinet override      12/17/14 1149 12/17/14 2359   12/17/14 1136  ceFAZolin (ANCEF) IVPB 2 g/50 mL premix  Status:  Discontinued     2 g 100 mL/hr over 30 Minutes Intravenous On call to O.R. 12/17/14 1136 12/17/14 1436      Assessment/Plan: Right chin cellulitis/abscess, possibly MRSA, on vancomycin Right chin and cheek remain tender with some worsening of edema.  Still, chin site remains firm without fluctuance.  Continue vancomycin.  Will try to get nursing to help with moist heat to the chin.  Will follow.  I&D will be considered further if findings do not improve in the next 24 to 48 hours.  LOS: 1 day    Kendle Turbin 12/18/2014

## 2014-12-19 ENCOUNTER — Inpatient Hospital Stay (HOSPITAL_COMMUNITY): Payer: Medicaid Other | Admitting: Certified Registered"

## 2014-12-19 ENCOUNTER — Encounter (HOSPITAL_COMMUNITY)
Admission: RE | Disposition: A | Discharge status: Home / Self Care | Payer: Self-pay | Source: Ambulatory Visit | Attending: Internal Medicine

## 2014-12-19 HISTORY — PX: INCISION AND DRAINAGE ABSCESS: SHX5864

## 2014-12-19 LAB — CBC
HCT: 38.8 % (ref 36.0–46.0)
Hemoglobin: 11.6 g/dL — ABNORMAL LOW (ref 12.0–15.0)
MCH: 23.2 pg — AB (ref 26.0–34.0)
MCHC: 29.9 g/dL — AB (ref 30.0–36.0)
MCV: 77.4 fL — ABNORMAL LOW (ref 78.0–100.0)
PLATELETS: 433 10*3/uL — AB (ref 150–400)
RBC: 5.01 MIL/uL (ref 3.87–5.11)
RDW: 16.5 % — ABNORMAL HIGH (ref 11.5–15.5)
WBC: 10.2 10*3/uL (ref 4.0–10.5)

## 2014-12-19 SURGERY — INCISION AND DRAINAGE, ABSCESS
Anesthesia: General | Site: Chin | Laterality: Right

## 2014-12-19 MED ORDER — LACTATED RINGERS IV SOLN
INTRAVENOUS | Status: DC | PRN
  Administered 2014-12-19: 20:00:00 via INTRAVENOUS

## 2014-12-19 MED ORDER — OXYCODONE HCL 5 MG/5ML PO SOLN: 5.0000 mg | Freq: Once | ORAL | Status: DC | PRN

## 2014-12-19 MED ORDER — MUPIROCIN 2 % EX OINT
1.0000 "application " | TOPICAL_OINTMENT | Freq: Two times a day (BID) | CUTANEOUS | Status: DC
  Administered 2014-12-19 – 2014-12-22 (×7): 1 via NASAL
  Filled 2014-12-19 (×3): qty 22

## 2014-12-19 MED ORDER — ONDANSETRON HCL 4 MG/2ML IJ SOLN
INTRAMUSCULAR | Status: DC | PRN
  Administered 2014-12-19: 4 mg via INTRAVENOUS

## 2014-12-19 MED ORDER — PROMETHAZINE HCL 25 MG/ML IJ SOLN: 6.2500 mg | INTRAMUSCULAR | Status: DC | PRN

## 2014-12-19 MED ORDER — CHLORHEXIDINE GLUCONATE CLOTH 2 % EX PADS
6.0000 | MEDICATED_PAD | Freq: Every day | CUTANEOUS | Status: DC
  Administered 2014-12-19 – 2014-12-22 (×4): 6 via TOPICAL

## 2014-12-19 MED ORDER — MIDAZOLAM HCL 2 MG/2ML IJ SOLN
INTRAMUSCULAR | Status: DC | PRN
  Administered 2014-12-19: 2 mg via INTRAVENOUS

## 2014-12-19 MED ORDER — MIDAZOLAM HCL 2 MG/2ML IJ SOLN
INTRAMUSCULAR | Status: AC
  Filled 2014-12-19: qty 4

## 2014-12-19 MED ORDER — HYDROMORPHONE HCL 1 MG/ML IJ SOLN
0.2500 mg | INTRAMUSCULAR | Status: DC | PRN
  Administered 2014-12-19 (×2): 0.5 mg via INTRAVENOUS

## 2014-12-19 MED ORDER — SUCCINYLCHOLINE CHLORIDE 20 MG/ML IJ SOLN
INTRAMUSCULAR | Status: AC
  Filled 2014-12-19: qty 1

## 2014-12-19 MED ORDER — ONDANSETRON HCL 4 MG/2ML IJ SOLN
INTRAMUSCULAR | Status: AC
  Filled 2014-12-19: qty 2

## 2014-12-19 MED ORDER — SUFENTANIL CITRATE 50 MCG/ML IV SOLN
INTRAVENOUS | Status: AC
  Filled 2014-12-19: qty 1

## 2014-12-19 MED ORDER — OXYCODONE HCL 5 MG PO TABS: 5.0000 mg | ORAL_TABLET | Freq: Once | ORAL | Status: DC | PRN

## 2014-12-19 MED ORDER — PROPOFOL 10 MG/ML IV BOLUS
INTRAVENOUS | Status: DC | PRN
Start: 2014-12-19 — End: 2014-12-19
  Administered 2014-12-19: 200 mg via INTRAVENOUS

## 2014-12-19 MED ORDER — LIDOCAINE-EPINEPHRINE (PF) 1 %-1:200000 IJ SOLN
INTRAMUSCULAR | Status: AC
  Filled 2014-12-19: qty 30

## 2014-12-19 MED ORDER — SODIUM CHLORIDE 0.9 % IJ SOLN
INTRAMUSCULAR | Status: AC
Start: 2014-12-19 — End: 2014-12-19
  Filled 2014-12-19: qty 10

## 2014-12-19 MED ORDER — SUFENTANIL CITRATE 50 MCG/ML IV SOLN
INTRAVENOUS | Status: DC | PRN
Start: 2014-12-19 — End: 2014-12-19
  Administered 2014-12-19 (×2): 10 ug via INTRAVENOUS

## 2014-12-19 MED ORDER — SODIUM CHLORIDE 0.9 % IR SOLN
Status: DC | PRN
  Administered 2014-12-19: 1

## 2014-12-19 MED ORDER — HYDROMORPHONE HCL 1 MG/ML IJ SOLN
INTRAMUSCULAR | Status: AC
  Filled 2014-12-19: qty 1

## 2014-12-19 MED ORDER — LIDOCAINE HCL (CARDIAC) 20 MG/ML IV SOLN
INTRAVENOUS | Status: AC
  Filled 2014-12-19: qty 5

## 2014-12-19 MED ORDER — PROPOFOL 10 MG/ML IV BOLUS
INTRAVENOUS | Status: AC
  Filled 2014-12-19: qty 20

## 2014-12-19 MED ORDER — LIDOCAINE-EPINEPHRINE (PF) 1 %-1:200000 IJ SOLN
INTRAMUSCULAR | Status: DC | PRN
  Administered 2014-12-19: 1 mL via INTRADERMAL

## 2014-12-19 SURGICAL SUPPLY — 40 items
BLADE SURG 15 STRL LF DISP TIS (BLADE) IMPLANT
BLADE SURG 15 STRL SS (BLADE) ×3
BNDG CONFORM 2 STRL LF (GAUZE/BANDAGES/DRESSINGS) ×1 IMPLANT
CATH ROBINSON RED A/P 12FR (CATHETERS) ×3 IMPLANT
COVER SURGICAL LIGHT HANDLE (MISCELLANEOUS) ×4 IMPLANT
CRADLE DONUT ADULT HEAD (MISCELLANEOUS) ×2 IMPLANT
DRAIN PENROSE 1/4X12 LTX STRL (WOUND CARE) ×3 IMPLANT
DRAPE ORTHO SPLIT 77X108 STRL (DRAPES) ×3
DRAPE PROXIMA HALF (DRAPES) IMPLANT
DRAPE SURG ORHT 6 SPLT 77X108 (DRAPES) ×1 IMPLANT
DRSG PAD ABDOMINAL 8X10 ST (GAUZE/BANDAGES/DRESSINGS) IMPLANT
ELECT COATED BLADE 2.86 ST (ELECTRODE) ×1 IMPLANT
ELECT REM PT RETURN 9FT ADLT (ELECTROSURGICAL)
ELECTRODE REM PT RTRN 9FT ADLT (ELECTROSURGICAL) ×1 IMPLANT
GAUZE SPONGE 4X4 12PLY STRL (GAUZE/BANDAGES/DRESSINGS) IMPLANT
GAUZE SPONGE 4X4 16PLY XRAY LF (GAUZE/BANDAGES/DRESSINGS) ×3 IMPLANT
GLOVE BIO SURGEON STRL SZ7.5 (GLOVE) ×3 IMPLANT
GOWN STRL REUS W/ TWL LRG LVL3 (GOWN DISPOSABLE) ×1 IMPLANT
GOWN STRL REUS W/TWL LRG LVL3 (GOWN DISPOSABLE) ×3
KIT BASIN OR (CUSTOM PROCEDURE TRAY) ×3 IMPLANT
KIT ROOM TURNOVER OR (KITS) ×3 IMPLANT
MARKER SKIN DUAL TIP RULER LAB (MISCELLANEOUS) ×1 IMPLANT
NDL HYPO 25GX1X1/2 BEV (NEEDLE) ×1 IMPLANT
NEEDLE HYPO 25GX1X1/2 BEV (NEEDLE) ×3 IMPLANT
NS IRRIG 1000ML POUR BTL (IV SOLUTION) ×3 IMPLANT
PACK SURGICAL SETUP 50X90 (CUSTOM PROCEDURE TRAY) ×3 IMPLANT
PAD ARMBOARD 7.5X6 YLW CONV (MISCELLANEOUS) ×6 IMPLANT
PENCIL BUTTON HOLSTER BLD 10FT (ELECTRODE) ×1 IMPLANT
RUBBERBAND STERILE (MISCELLANEOUS) IMPLANT
SPONGE GAUZE 4X4 12PLY STER LF (GAUZE/BANDAGES/DRESSINGS) ×2 IMPLANT
SUT ETHILON 2 0 FS 18 (SUTURE) ×3 IMPLANT
SUT SILK 2 0 SH CR/8 (SUTURE) IMPLANT
SWAB COLLECTION DEVICE MRSA (MISCELLANEOUS) ×3 IMPLANT
SYR BULB IRRIGATION 50ML (SYRINGE) ×3 IMPLANT
SYR CONTROL 10ML LL (SYRINGE) IMPLANT
TAPE CLOTH SURG 4X10 WHT LF (GAUZE/BANDAGES/DRESSINGS) ×2 IMPLANT
TUBE ANAEROBIC SPECIMEN COL (MISCELLANEOUS) ×3 IMPLANT
TUBE CONNECTING 12'X1/4 (SUCTIONS) ×1
TUBE CONNECTING 12X1/4 (SUCTIONS) ×2 IMPLANT
YANKAUER SUCT BULB TIP NO VENT (SUCTIONS) ×3 IMPLANT

## 2014-12-19 NOTE — Anesthesia Preprocedure Evaluation (Addendum)
Anesthesia Evaluation  Patient identified by MRN, date of birth, ID band Patient awake    Reviewed: Allergy & Precautions, NPO status , Patient's Chart, lab work & pertinent test results  Airway Mallampati: III  TM Distance: >3 FB Neck ROM: Full  Mouth opening: Limited Mouth Opening  Dental  (+) Poor Dentition   Pulmonary neg pulmonary ROS, Current Smoker,    breath sounds clear to auscultation       Cardiovascular  Rhythm:Regular Rate:Normal     Neuro/Psych  Headaches, Bipolar Disorder    GI/Hepatic GERD  ,(+)     substance abuse (polysubstance abuse ongoing)  alcohol use, cocaine use and marijuana use, Hepatitis - (HCV ), C  Endo/Other  negative endocrine ROS  Renal/GU negative Renal ROS     Musculoskeletal  (+) Arthritis ,   Abdominal   Peds  Hematology  (+) anemia ,   Anesthesia Other Findings   Reproductive/Obstetrics                            Anesthesia Physical  Anesthesia Plan  ASA: II  Anesthesia Plan: General   Post-op Pain Management:    Induction: Intravenous  Airway Management Planned: LMA  Additional Equipment:   Intra-op Plan:   Post-operative Plan: Extubation in OR  Informed Consent: I have reviewed the patients History and Physical, chart, labs and discussed the procedure including the risks, benefits and alternatives for the proposed anesthesia with the patient or authorized representative who has indicated his/her understanding and acceptance.   Dental advisory given  Plan Discussed with: CRNA  Anesthesia Plan Comments:        Anesthesia Quick Evaluation

## 2014-12-19 NOTE — Progress Notes (Signed)
Patient is consistently demanding for pain medication throughout the shift. Patient is upset when RN isn't able to immediately respond to her pain needs, secondary to coexisting obligations on the floor. RN tried to set multiple realistic expectations with this patient. Patient is calling 15-30 minutes prior to medication due time. Several times when this RN responds, patient is found asleep. Patient, mental status wise, seems drowsy and has slurred speech. Patient has been receiving Morphine 2mg , an hour later asking for Oxy IR 10 mg, and the following hour tylenol. Patient states the pain medication isn't effective. RN will continue to monitor patient.   Veatrice KellsMahmoud,Aariv Medlock I, RN

## 2014-12-19 NOTE — Transfer of Care (Signed)
Immediate Anesthesia Transfer of Care Note  Patient: Monique [name]  Procedure(s) Performed: Procedure(s): INCISION AND DRAINAGE OF ABSCESS ON RIGHT SIDE OF CHIN (Right)  Patient Location: PACU  Anesthesia Type:General  Level of Consciousness: awake, alert , oriented and patient cooperative  Airway & Oxygen Therapy: Patient Spontanous Breathing and Patient connected to nasal cannula oxygen  Post-op Assessment: Report given to RN, Post -op Vital signs reviewed and stable and Patient moving all extremities X 4  Post vital signs: Reviewed and stable  Last Vitals:  Filed Vitals:   12/19/14 2038  BP: 134/96  Pulse: 93  Temp: 36.7 C  Resp: 15    Complications: No apparent anesthesia complications

## 2014-12-19 NOTE — Brief Op Note (Signed)
12/17/2014 - 12/19/2014  8:31 PM  PATIENT:  Monique [name]  39 y.o. female  PRE-OPERATIVE DIAGNOSIS:  right chin abscess  POST-OPERATIVE DIAGNOSIS:  same  PROCEDURE:  Procedure(s): INCISION AND DRAINAGE OF ABSCESS ON RIGHT SIDE OF CHIN (Right)  SURGEON:  Surgeon(s) and Role:    * Christia Readingwight Niesha Bame, MD - Primary  PHYSICIAN ASSISTANT:   ASSISTANTS: none   ANESTHESIA:   general  EBL:     BLOOD ADMINISTERED:none  DRAINS: Penrose drain in the right chin   LOCAL MEDICATIONS USED:  LIDOCAINE   SPECIMEN:  Source of Specimen:  right chin abscess  DISPOSITION OF SPECIMEN:  MICRO  COUNTS:  YES  TOURNIQUET:  * No tourniquets in log *  DICTATION: .Other Dictation: Dictation Number [id number]  PLAN OF CARE: Return to room  PATIENT DISPOSITION:  PACU - hemodynamically stable.   Delay start of Pharmacological VTE agent (>24hrs) due to surgical blood loss or risk of bleeding: no

## 2014-12-19 NOTE — Anesthesia Procedure Notes (Signed)
Procedure Name: LMA Insertion Date/Time: 12/19/2014 8:09 PM Performed by: Melina SchoolsBANKS, Layne Lebon J Pre-anesthesia Checklist: Patient identified, Emergency Drugs available, Suction available, Patient being monitored and Timeout performed Patient Re-evaluated:Patient Re-evaluated prior to inductionOxygen Delivery Method: Circle system utilized Preoxygenation: Pre-oxygenation with 100% oxygen Intubation Type: IV induction Ventilation: Mask ventilation without difficulty LMA: LMA inserted LMA Size: 4.0 Number of attempts: 1 Placement Confirmation: positive ETCO2 Tube secured with: Tape Dental Injury: Teeth and Oropharynx as per pre-operative assessment

## 2014-12-19 NOTE — Progress Notes (Signed)
2 Days Post-Op  Subjective: She feels that the face feels less tight today but there is still pretty bad pain and swelling.  Objective: Vital signs in last 24 hours: Temp:  [98.4 F (36.9 C)-99 F (37.2 C)] 99 F (37.2 C) (10/23 0745) Pulse Rate:  [71-78] 76 (10/23 0745) Resp:  [17-21] 20 (10/23 0745) BP: (126-139)/(76-84) 137/83 mmHg (10/23 0745) SpO2:  [97 %-100 %] 97 % (10/23 0745) Last BM Date: 12/17/14  Intake/Output from previous day: 10/22 0701 - 10/23 0700 In: 2398 [P.O.:1898; IV Piggyback:500] Out: 850 [Urine:850] Intake/Output this shift: Total I/O In: 240 [P.O.:240] Out: 300 [Urine:300]  General appearance: alert, cooperative and no distress Head: Right chin scab, no drainage, firm area deep to scab now extends posterorlaterally.  Surrounding edema.  Area quite tender.  Lab Results:   Recent Labs  12/18/14 0322 12/19/14 0950  WBC 10.9* 10.2  HGB 10.1* 11.6*  HCT 33.7* 38.8  PLT 357 433*   BMET  Recent Labs  12/17/14 1150 12/18/14 0322  NA 133* 135  K 4.2 4.0  CL 105 105  CO2 19* 21*  GLUCOSE 119* 133*  BUN 14 12  CREATININE 0.57 0.56  CALCIUM 9.0 9.0   PT/INR  Recent Labs  12/17/14 1150  LABPROT 13.3  INR 0.99   ABG No results for input(s): PHART, HCO3 in the last 72 hours.  Invalid input(s): PCO2, PO2  Studies/Results: No results found.  Anti-infectives: Anti-infectives    Start     Dose/Rate Route Frequency Ordered Stop   12/18/14 2000  vancomycin (VANCOCIN) 1,500 mg in sodium chloride 0.9 % 500 mL IVPB     1,500 mg 250 mL/hr over 120 Minutes Intravenous Every 12 hours 12/18/14 1116     12/18/14 0000  vancomycin (VANCOCIN) IVPB 1000 mg/200 mL premix  Status:  Discontinued     1,000 mg 200 mL/hr over 60 Minutes Intravenous Every 8 hours 12/17/14 1544 12/18/14 1116   12/17/14 1600  vancomycin (VANCOCIN) 2,000 mg in sodium chloride 0.9 % 500 mL IVPB     2,000 mg 250 mL/hr over 120 Minutes Intravenous  Once 12/17/14 1542  12/17/14 1834   12/17/14 1530  penicillin v potassium (VEETID) tablet 500 mg  Status:  Discontinued     500 mg Oral  Once 12/17/14 1519 12/17/14 1534   12/17/14 1149  ceFAZolin (ANCEF) 2-3 GM-% IVPB SOLR    Comments:  Girtha HakeKensmoe, Katherine  : cabinet override      12/17/14 1149 12/17/14 2359   12/17/14 1136  ceFAZolin (ANCEF) IVPB 2 g/50 mL premix  Status:  Discontinued     2 g 100 mL/hr over 30 Minutes Intravenous On call to O.R. 12/17/14 1136 12/17/14 1436      Assessment/Plan: Right chin cellulitis/abscess The right chin infection feels more firm in a broader area at this point.  Thus, we discussed proceeding with incision and drainage under anesthesia.  Risks, benefits, and alternatives were discussed.  A Penrose drain will be placed at the time and a culture will be sent.  She will require IV antibiotics until her exam improves.  LOS: 2 days    Olney Monier 12/19/2014

## 2014-12-19 NOTE — Clinical Social Work Note (Signed)
CSW received referral for SNF.  Case manager can help with assistance with prescriptions.  CSW to sign off please re-consult if social work needs arise.  Ervin KnackEric R. Xiomar Crompton, MSW, [employer]CSWA [phone]

## 2014-12-19 NOTE — Progress Notes (Signed)
[facility]                                                                              Patient Demographics  Monique [name], is a 39 y.o. female, DOB - 1975-09-16, JYN:[id number]  Admit date - 12/17/2014   Admitting Physician Calvert Cantor, MD  Outpatient Primary MD for the patient is [name] Patient  LOS - 2   Chief Complaint  Patient presents with  . Facial Pain      HPI on 12/17/2014 by Dr. Calvert Cantor Monique [name] is a 39 y.o. 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 The patient was seen by Dr. [name] prior to going to the OR today and found to have a cellulitis of her face. Surgery was postponed and the patient was sent down to the ER. She has been on doxycycline for the osteomyelitis but the course was finished a few days ago. She noted a pimple on her chin about a week ago and the chin subsequently become more swollen. A couple of days ago, the swelling spread to the left side of her face. She is having a significant amount of pain in her left mandibular area which is radiating to her ear. No c/o tooth ache or cavities. She states that she is still able to express pus from the original pimple on her chin. No c/o fever or chills.   Assessment & Plan   Facial cellulitis and abscess -Patient was on doxycycline as an outpatient, currently on vancomycin -ENT, Dr. Jenne Pane consulted and appreciated- continue IV antibiotics, warm compresses, no need for I&D at this time -Appears to be improving -Continue pain control, kpad (directions given to use with towel) -It seems that patient is very demanding when it comes to her pain medications.  Per records, patient is supposed to be taking 1 tablet Percocet 5mg  q6hrs at home, she states she takes 10mg  q4hrs at home.   -Have changed patient to oxy IR (as patient is exceeding 4gday acetaminophen), tylenol, and morphine.  Explained to patient that she should not get used to this regimen as when she  is discharged, she will not be given meds, especially IV pain meds.  Acute bronchitis -Likely viral, will continue to follow  Right ankle osteomyelitis -Patient was scheduled for surgery with Dr. [name], on 10/21, however this was canceled due to facial cellulitis  Nicotine abuse -Discussed smoking cessation -Continue nicotine patch  Anemia of chronic disease -baseline Hemoglobin 9-10 ,currently10.1 -Continue to monitor CBC  Anxiety -Continue Xanax, Wellbutrin and Elavil  Code Status: Full  Family Communication: None at bedside  Disposition Plan: Admitted, continue to monitor facial cellulitis  Time Spent in minutes   30 minutes  Procedures  None  Consults   ENT, Dr. Jenne Pane  DVT Prophylaxis  Lovenox  Lab Results  Component Value Date   PLT 357 12/18/2014    Medications  Scheduled Meds: . ALPRAZolam  0.25 mg Oral BID  . ALPRAZolam  0.5 mg Oral QHS  . amitriptyline  10 mg Oral QHS  . buPROPion  100 mg Oral BID  . enoxaparin (LOVENOX) injection  40 mg Subcutaneous Q24H  . fentaNYL (SUBLIMAZE)  injection  100 mcg Intravenous Once  . Influenza vac split quadrivalent PF  0.5 mL Intramuscular Tomorrow-1000  . midazolam  2 mg Intravenous Once  . nicotine  7 mg Transdermal Daily  . sodium chloride  3 mL Intravenous Q12H  . vancomycin  1,500 mg Intravenous Q12H   Continuous Infusions: . lactated ringers 10 mL/hr at 12/17/14 1216   PRN Meds:.sodium chloride, acetaminophen **OR** acetaminophen, alum & mag hydroxide-simeth, diphenhydrAMINE, morphine injection, ondansetron **OR** ondansetron (ZOFRAN) IV, oxyCODONE, senna-docusate, sodium chloride  Antibiotics    Anti-infectives    Start     Dose/Rate Route Frequency Ordered Stop   12/18/14 2000  vancomycin (VANCOCIN) 1,500 mg in sodium chloride 0.9 % 500 mL IVPB     1,500 mg 250 mL/hr over 120 Minutes Intravenous Every 12 hours 12/18/14 1116     12/18/14 0000  vancomycin (VANCOCIN) IVPB 1000 mg/200 mL premix  Status:   Discontinued     1,000 mg 200 mL/hr over 60 Minutes Intravenous Every 8 hours 12/17/14 1544 12/18/14 1116   12/17/14 1600  vancomycin (VANCOCIN) 2,000 mg in sodium chloride 0.9 % 500 mL IVPB     2,000 mg 250 mL/hr over 120 Minutes Intravenous  Once 12/17/14 1542 12/17/14 1834   12/17/14 1530  penicillin v potassium (VEETID) tablet 500 mg  Status:  Discontinued     500 mg Oral  Once 12/17/14 1519 12/17/14 1534   12/17/14 1149  ceFAZolin (ANCEF) 2-3 GM-% IVPB SOLR    Comments:  Girtha Hake  : cabinet override      12/17/14 1149 12/17/14 2359   12/17/14 1136  ceFAZolin (ANCEF) IVPB 2 g/50 mL premix  Status:  Discontinued     2 g 100 mL/hr over 30 Minutes Intravenous On call to O.R. 12/17/14 1136 12/17/14 1436        Subjective:   Monique [name] seen and examined today.  Patient continues to complain of pain rightsided facial pain, but states it is improving and she has noticed drainage after using the kpad.  She complains of right leg pain.  Denies any chest pain, shortness of breath, abdominal pain.   Objective:   Filed Vitals:   12/18/14 1516 12/18/14 2018 12/19/14 0547 12/19/14 0745  BP: 139/83 126/76 132/84 137/83  Pulse: 77 78 71 76  Temp: 98.7 F (37.1 C) 98.9 F (37.2 C) 98.4 F (36.9 C) 99 F (37.2 C)  TempSrc: Oral   Oral  Resp: Height:      Weight:      SpO2: 100% 98% 99% 97%    Wt Readings from Last 3 Encounters:  12/17/14 83.915 kg (185 lb)  11/25/14 81.647 kg (180 lb)  09/28/14 81.647 kg (180 lb)     Intake/Output Summary (Last 24 hours) at 12/19/14 1115 Last data filed at 12/19/14 0600  Gross per 24 hour  Intake   1678 ml  Output    350 ml  Net   1328 ml    Exam  General: Well developed, well nourished, NAD  HEENT: NCAT, mucous membranes moist.  Edema (improving) and tenderness with erythema (improving) noted on the right maxillary/mandibular area extending to the chin  Cardiovascular: S1 S2 auscultated, RRR, no  murmurs  Respiratory: Clear to auscultation bilaterally   Abdomen: Soft, nontender, nondistended, + bowel sounds  Extremities: warm dry without cyanosis clubbing. Trace edema RLE  Neuro: AAOx3, nonfocal  Psych: Normal affect and demeanor   Data Review   Micro Results Recent  Results (from the past 240 hour(s))  Surgical pcr screen     Status: Abnormal   Collection Time: 12/17/14  1:00 PM  Result Value Ref Range Status   MRSA, PCR NEGATIVE NEGATIVE Final   Staphylococcus aureus POSITIVE (A) NEGATIVE Final    Comment:        The Xpert SA Assay (FDA approved for NASAL specimens in patients over 39 years of age), is one component of a comprehensive surveillance program.  Test performance has been validated by [facility] for patients greater than or equal to 39 year old. It is not intended to diagnose infection nor to guide or monitor treatment.     Radiology Reports Dg Ankle Complete Right  11/25/2014  CLINICAL DATA:  Chronic ankle fracture. Pain and swelling. Hardware removed 1 month ago. EXAM: RIGHT ANKLE - COMPLETE 3+ VIEW COMPARISON:  09/28/2014 FINDINGS: Bimalleolar ankle fracture has healed. Hardware removal with the exception of a single screw overlying the fibula is unchanged from the prior study. There is diffuse soft tissue swelling. There is mild joint space narrowing in the ankle joint with joint effusion. There is a large area of mixed sclerotic and lytic change in the distal tibia extending to the tibial plafond and which has progressed in appearance from the prior study. This may be an osteochondral fragment. This is difficult to identify on the prior MRI of 08/30/2014 due to extensive artifact from hardware. The hardware has been removed in the interval and follow-up MRI may be helpful. IMPRESSION: Postop bimalleolar ankle fracture with hardware removal. There is a large joint effusion and diffuse soft tissue swelling Large defect in the distal tibia could represent  osteochondral defect. Consider MRI for further evaluation. Electronically Signed   By: Marlan Palauharles  Clark M.D.   On: 11/25/2014 11:55   Mr Ankle Right W Wo Contrast  11/25/2014  CLINICAL DATA:  Infected hardware and chronic septic arthritis. Hardware removed from RIGHT ankle. RIGHT ankle pain. Postoperative infection last year. EXAM: MRI OF THE RIGHT ANKLE WITHOUT AND WITH CONTRAST TECHNIQUE: Multiplanar, multisequence MR imaging of the ankle was performed before and after the administration of intravenous contrast. CONTRAST:  20mL MULTIHANCE GADOBENATE DIMEGLUMINE 529 MG/ML IV SOLN COMPARISON:  None. FINDINGS: Interfragmentary screw in the distal fibula remains present with the attendant artifact. Florid inflammatory changes are present around the ankle. 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 There is a large area of nonenhancing sclerotic bone in the central tibial plafond. 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 Diffuse enhancing synovitis of the ankle joint is present. There is enhancing granulation tissue in the screw tracts along the medial malleolus and at the site of the removed syndesmotic screws. Ankle effusion and periarticular phlegmon is present. There is subchondral sclerosis and partial collapse of the talar dome centrally. This may be secondary to AVN or infection or combination of the 2. Subtalar effusion is present but no convincing evidence of subtalar septic arthritis. Midfoot shows heterogeneous marrow signal likely secondary to disuse osteopenia. Grossly, the posterior medial, posterior lateral and anterior tendons are intact. The Achilles tendon is intact. Plantar fascia is normal. 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 Septic joint effusion and periarticular phlegmon. Large necrotic bony  sequestrum in the central tibial plafond. No soft tissue abscess. The enhancement along the removed hardware tract may represent granulation  tissue but given the septic arthritis, could also represent other sites of developing osteomyelitis. Electronically Signed   By: Andreas Newport M.D.   On: 11/25/2014 15:53    CBC  Recent Labs Lab 12/17/14 1150 12/18/14 0322  WBC 10.2 10.9*  HGB 10.8* 10.1*  HCT 36.1 33.7*  PLT 402* 357  MCV 78.0 78.9  MCH 23.3* 23.7*  MCHC 29.9* 30.0  RDW 16.9* 17.0*    Chemistries   Recent Labs Lab 12/17/14 1150 12/18/14 0322  NA 133* 135  K 4.2 4.0  CL 105 105  CO2 19* 21*  GLUCOSE 119* 133*  BUN 14 12  CREATININE 0.57 0.56  CALCIUM 9.0 9.0  AST 18  --   ALT 17  --   ALKPHOS 57  --   BILITOT 0.4  --    ------------------------------------------------------------------------------------------------------------------ estimated creatinine clearance is 106.1 mL/min (by C-G formula based on Cr of 0.56). ------------------------------------------------------------------------------------------------------------------ No results for input(s): HGBA1C in the last 72 hours. ------------------------------------------------------------------------------------------------------------------ No results for input(s): CHOL, HDL, LDLCALC, TRIG, CHOLHDL, LDLDIRECT in the last 72 hours. ------------------------------------------------------------------------------------------------------------------ No results for input(s): TSH, T4TOTAL, T3FREE, THYROIDAB in the last 72 hours.  Invalid input(s): FREET3 ------------------------------------------------------------------------------------------------------------------ No results for input(s): VITAMINB12, FOLATE, FERRITIN, TIBC, IRON, RETICCTPCT in the last 72 hours.  Coagulation profile  Recent Labs Lab 12/17/14 1150  INR 0.99    No results for input(s): DDIMER in the last 72 hours.  Cardiac Enzymes No  results for input(s): CKMB, TROPONINI, MYOGLOBIN in the last 168 hours.  Invalid input(s): CK ------------------------------------------------------------------------------------------------------------------ Invalid input(s): POCBNP    Mayerly Kaman D.O. on 12/19/2014 at 11:15 AM  Between 7am to 7pm - Pager - [phone]  After 7pm go to [email or web address] - password [email or web address]  And look for the night coverage person covering for me after hours  [facility] Office  [phone]

## 2014-12-19 NOTE — Progress Notes (Addendum)
Patient developed 6 cm wheal at IV site on left arm after receiving IV morphine & IV normal saline flush.  Patient denies pain or itching at site.  IV removed and cold compress applied.  IV team and Dr. Catha GosselinMikhail notified.  Will continue to monitor.

## 2014-12-19 NOTE — Op Note (Signed)
Monique [name], Monique [name]           ACCOUNT NO.:  [id number]  MEDICAL RECORD NO.:  [id number]  LOCATION:  6E02C                        FACILITY:  [facility]  PHYSICIAN:  Antony Contraswight D Laportia Carley, MD     DATE OF BIRTH:  Jun 30, 1975  DATE OF PROCEDURE:  12/19/2014 DATE OF DISCHARGE:                              OPERATIVE REPORT   PREOPERATIVE DIAGNOSIS:  Right chin abscess.  POSTOPERATIVE DIAGNOSIS:  Right chin abscess.  PROCEDURE:  Incision and drainage of right chin abscess.  SURGEON:  Christia Readingwight Francyne Arreaga, MD.  ANESTHESIA:  General LMA.  COMPLICATIONS:  None.  INDICATION:  The patient is a 39 year old female who presented to the hospital on Friday for an ankle surgery by Dr. Lajoyce Cornersuda but was found to have an infection of her right chin at that time.  She was then admitted to the hospital and has been treated with IV vancomycin since that time. The wound has been draining somewhat but the area of firmness has worsened in spite of IV antibiotics.  Thus, she presents to the operating room for surgical drainage.  FINDINGS:  There was only about one drop of pus that was countered in the procedure, but the firm area was broken, but it was bluntly dissected in order to break up loculations and create a drainage pathway.  Cultures were taken from the depths of the wound, but blood was mixed when it was taken.  DESCRIPTION OF PROCEDURE:  The patient was identified in the holding room, informed consent having been obtained including discussion of risks, benefits, alternatives, the patient was brought to the operative suite and put on the operative table in supine position.  Anesthesia was induced.  The patient was intubated by the Anesthesia team with an LMA without difficulty.  The patient was already on intravenous vancomycin. The eyes were taped closed, and the right chin was prepped and draped in sterile fashion.  The wound area was injected with 1% lidocaine with 1:200,000 epinephrine.  A hemostat was  used to remove the scab and was then used to probe the wound in the area of the palpated firmness.  No organized cavity was encountered, but the firm tissue was able to be broken up with the hemostat in a blunt fashion.  Culture swabs were passed into the depth of the wound and then sent for microbiology.  The wound was then copiously irrigated with saline using a red rubber catheter.  A 0.25-inch Penrose drain was placed into the wound, secured through the skin using a 2-0 nylon suture.  She was then washed off, and a dressing was applied.  She was returned to Anesthesia for wake up, was extubated and moved to the recovery room in stable condition.     Antony Contraswight D Mckinzi Eriksen, MD     DDB/MEDQ  D:  12/19/2014  T:  12/19/2014  Job:  [id number]

## 2014-12-20 ENCOUNTER — Encounter (HOSPITAL_COMMUNITY): Payer: Self-pay | Admitting: Otolaryngology

## 2014-12-20 LAB — BASIC METABOLIC PANEL
ANION GAP: 9 (ref 5–15)
BUN: 9 mg/dL (ref 6–20)
CHLORIDE: 101 mmol/L (ref 101–111)
CO2: 25 mmol/L (ref 22–32)
Calcium: 9.1 mg/dL (ref 8.9–10.3)
Creatinine, Ser: 0.7 mg/dL (ref 0.44–1.00)
GFR calc non Af Amer: >60 mL/min (ref >60)
Glucose, Bld: 147 mg/dL — ABNORMAL HIGH (ref 65–99)
Potassium: 3.9 mmol/L (ref 3.5–5.1)
SODIUM: 135 mmol/L (ref 135–145)

## 2014-12-20 LAB — CBC
HEMATOCRIT: 33.7 % — AB (ref 36.0–46.0)
HEMOGLOBIN: 10.1 g/dL — AB (ref 12.0–15.0)
MCH: 23.2 pg — ABNORMAL LOW (ref 26.0–34.0)
MCHC: 30 g/dL (ref 30.0–36.0)
MCV: 77.5 fL — ABNORMAL LOW (ref 78.0–100.0)
Platelets: 401 10*3/uL — ABNORMAL HIGH (ref 150–400)
RBC: 4.35 MIL/uL (ref 3.87–5.11)
RDW: 16.4 % — ABNORMAL HIGH (ref 11.5–15.5)
WBC: 10.1 10*3/uL (ref 4.0–10.5)

## 2014-12-20 MED ORDER — DM-GUAIFENESIN ER 30-600 MG PO TB12
2.0000 | ORAL_TABLET | Freq: Two times a day (BID) | ORAL | Status: DC
  Administered 2014-12-20 – 2014-12-22 (×5): 2 via ORAL
  Filled 2014-12-20 (×5): qty 2

## 2014-12-20 MED ORDER — PREGABALIN 50 MG PO CAPS
50.0000 mg | ORAL_CAPSULE | Freq: Three times a day (TID) | ORAL | Status: DC
  Administered 2014-12-20 – 2014-12-22 (×7): 50 mg via ORAL
  Filled 2014-12-20 (×7): qty 1

## 2014-12-20 NOTE — Progress Notes (Signed)
1 Day Post-Op  Subjective: Right chin and lower cheek remain sore with firmness.  Objective: Vital signs in last 24 hours: Temp:  [97.8 F (36.6 C)-98.5 F (36.9 C)] 98.3 F (36.8 C) (10/24 0900) Pulse Rate:  [75-93] 80 (10/24 0900) Resp:  [15-21] 20 (10/24 0900) BP: (119-152)/(72-97) 127/74 mmHg (10/24 0900) SpO2:  [96 %-100 %] 97 % (10/24 0900) Last BM Date: 12/17/14  Intake/Output from previous day: 10/23 0701 - 10/24 0700 In: 2180 [P.O.:480; I.V.:700; IV Piggyback:1000] Out: 500 [Urine:500] Intake/Output this shift: Total I/O In: 240 [P.O.:240] Out: -   General appearance: alert, cooperative and no distress Head: Dressing changed, only scant bloody drainage.  Penrose in place.  Right chin and lower cheek remain edematous, slightly softer perhaps  Lab Results:   Recent Labs  12/19/14 0950 12/20/14 0435  WBC 10.2 10.1  HGB 11.6* 10.1*  HCT 38.8 33.7*  PLT 433* 401*   BMET  Recent Labs  12/18/14 0322 12/20/14 0435  NA 135 135  K 4.0 3.9  CL 105 101  CO2 21* 25  GLUCOSE 133* 147*  BUN 12 9  CREATININE 0.56 0.70  CALCIUM 9.0 9.1   PT/INR No results for input(s): LABPROT, INR in the last 72 hours. ABG No results for input(s): PHART, HCO3 in the last 72 hours.  Invalid input(s): PCO2, PO2  Studies/Results: No results found.  Anti-infectives: Anti-infectives    Start     Dose/Rate Route Frequency Ordered Stop   12/18/14 2000  vancomycin (VANCOCIN) 1,500 mg in sodium chloride 0.9 % 500 mL IVPB     1,500 mg 250 mL/hr over 120 Minutes Intravenous Every 12 hours 12/18/14 1116     12/18/14 0000  vancomycin (VANCOCIN) IVPB 1000 mg/200 mL premix  Status:  Discontinued     1,000 mg 200 mL/hr over 60 Minutes Intravenous Every 8 hours 12/17/14 1544 12/18/14 1116   12/17/14 1600  vancomycin (VANCOCIN) 2,000 mg in sodium chloride 0.9 % 500 mL IVPB     2,000 mg 250 mL/hr over 120 Minutes Intravenous  Once 12/17/14 1542 12/17/14 1834   12/17/14 1530   penicillin v potassium (VEETID) tablet 500 mg  Status:  Discontinued     500 mg Oral  Once 12/17/14 1519 12/17/14 1534   12/17/14 1149  ceFAZolin (ANCEF) 2-3 GM-% IVPB SOLR    Comments:  Girtha HakeKensmoe, Katherine  : cabinet override      12/17/14 1149 12/17/14 2359   12/17/14 1136  ceFAZolin (ANCEF) IVPB 2 g/50 mL premix  Status:  Discontinued     2 g 100 mL/hr over 30 Minutes Intravenous On call to O.R. 12/17/14 1136 12/17/14 1436      Assessment/Plan: s/p Procedure(s): INCISION AND DRAINAGE OF ABSCESS ON RIGHT SIDE OF CHIN (Right) Continue IV antibiotic therapy and dressing changes with drain in place.  Culture was sent in the OR but not much pus was encountered.  Would favor continuing inpatient therapy until she appears to obviously improve clinically.  LOS: 3 days    Monique [name] 12/20/2014

## 2014-12-20 NOTE — Progress Notes (Signed)
ANTIBIOTIC CONSULT NOTE - FOLLOW UP  Pharmacy Consult for Vancomycin Indication: chin abscess/cellulitis  No Known Allergies  Patient Measurements: Height: 5\' 7"  (170.2 cm) Weight:  (pt refused) IBW/kg (Calculated) : 61.6  Vital Signs: Temp: 98.3 F (36.8 C) (10/24 0900) Temp Source: Oral (10/24 0900) BP: 127/74 mmHg (10/24 0900) Pulse Rate: 80 (10/24 0900) Intake/Output from previous day: 10/23 0701 - 10/24 0700 In: 2180 [P.O.:480; I.V.:700; IV Piggyback:1000] Out: 500 [Urine:500] Intake/Output from this shift: Total I/O In: 240 [P.O.:240] Out: -   Labs:  Recent Labs  12/18/14 0322 12/19/14 0950 12/20/14 0435  WBC 10.9* 10.2 10.1  HGB 10.1* 11.6* 10.1*  PLT 357 433* 401*  CREATININE 0.56  --  0.70   Estimated Creatinine Clearance: 106.1 mL/min (by C-G formula based on Cr of 0.7). No results for input(s): VANCOTROUGH, VANCOPEAK, VANCORANDOM, GENTTROUGH, GENTPEAK, GENTRANDOM, TOBRATROUGH, TOBRAPEAK, TOBRARND, AMIKACINPEAK, AMIKACINTROU, AMIKACIN in the last 72 hours.   Microbiology: Recent Results (from the past 720 hour(s))  Surgical pcr screen     Status: Abnormal   Collection Time: 12/17/14  1:00 PM  Result Value Ref Range Status   MRSA, PCR NEGATIVE NEGATIVE Final   Staphylococcus aureus POSITIVE (A) NEGATIVE Final    Comment:        The Xpert SA Assay (FDA approved for NASAL specimens in patients over 721 years of age), is one component of a comprehensive surveillance program.  Test performance has been validated by [facility] for patients greater than or equal to 39 year old. It is not intended to diagnose infection nor to guide or monitor treatment.   Anaerobic culture     Status: None (Preliminary result)   Collection Time: 12/19/14  9:47 PM  Result Value Ref Range Status   Specimen Description ABSCESS  Final   Special Requests RIGHT CHIN PT ON VANCOMYCIN  Final   Gram Stain   Final    NO WBC SEEN NO SQUAMOUS EPITHELIAL CELLS SEEN NO  ORGANISMS SEEN Performed at [facility]    Culture PENDING  Incomplete   Report Status PENDING  Incomplete  Culture, routine-abscess     Status: None (Preliminary result)   Collection Time: 12/19/14  9:47 PM  Result Value Ref Range Status   Specimen Description ABSCESS  Final   Special Requests RIGHT CHIN PT ON VANCOMYCIN  Final   Gram Stain PENDING  Incomplete   Culture NO GROWTH Performed at [facility]   Final   Report Status PENDING  Incomplete    Anti-infectives    Start     Dose/Rate Route Frequency Ordered Stop   12/18/14 2000  vancomycin (VANCOCIN) 1,500 mg in sodium chloride 0.9 % 500 mL IVPB     1,500 mg 250 mL/hr over 120 Minutes Intravenous Every 12 hours 12/18/14 1116     12/18/14 0000  vancomycin (VANCOCIN) IVPB 1000 mg/200 mL premix  Status:  Discontinued     1,000 mg 200 mL/hr over 60 Minutes Intravenous Every 8 hours 12/17/14 1544 12/18/14 1116   12/17/14 1600  vancomycin (VANCOCIN) 2,000 mg in sodium chloride 0.9 % 500 mL IVPB     2,000 mg 250 mL/hr over 120 Minutes Intravenous  Once 12/17/14 1542 12/17/14 1834   12/17/14 1530  penicillin v potassium (VEETID) tablet 500 mg  Status:  Discontinued     500 mg Oral  Once 12/17/14 1519 12/17/14 1534   12/17/14 1149  ceFAZolin (ANCEF) 2-3 GM-% IVPB SOLR    Comments:  Girtha HakeKensmoe, Katherine  :  cabinet override      12/17/14 1149 12/17/14 2359   12/17/14 1136  ceFAZolin (ANCEF) IVPB 2 g/50 mL premix  Status:  Discontinued     2 g 100 mL/hr over 30 Minutes Intravenous On call to O.R. 12/17/14 1136 12/17/14 1436      Assessment: 39 yo F on Vancomycin d#4 for chin abscess/cellulitis.  Pt s/p OR 10/23 for I&D and drain placement.  Cx pending.  Renal function remains stable with CrCl >100.  Goal of Therapy:  Vancomycin trough level 10-15 mcg/ml  Plan:  Continue Vancomycin 1500 mg IV q12h Consider Vancomycin trough if therapy continues over the next 24-48 hours.  [employer], Pharm.D.,  BCPS Clinical Pharmacist Pager [phone] 12/20/2014 1:29 PM

## 2014-12-20 NOTE — Care Management (Signed)
CM met with patient at bedside to discuss possible discharge plan, patient is on call to the OR for I&D of left chin. Explained that f/u at the [facility] was scheduled 10/27 at 3:30 but would review, in case discharge is anticipated later in the week. Discussed MATCH program, and guidelines, including $3 per prescription, patient is amendable Patient eligible MATCH program, enrolled in [facility], letter will be placed on patient's shadow chart to hand to patient upon discharge. CM will continue to follow.

## 2014-12-20 NOTE — Progress Notes (Signed)
[facility]                                                                              Patient Demographics  Monique [name], is a 39 y.o. female, DOB - 1975-12-15, OZH:[id number]  Admit date - 12/17/2014   Admitting Physician Calvert Cantor, MD  Outpatient Primary MD for the patient is [name] Patient  LOS - 3   Chief Complaint  Patient presents with  . Facial Pain      HPI on 12/17/2014 by Dr. Calvert Cantor Monique [name] is a 39 y.o. 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 The patient was seen by Dr. [name] prior to going to the OR today and found to have a cellulitis of her face. Surgery was postponed and the patient was sent down to the ER. She has been on doxycycline for the osteomyelitis but the course was finished a few days ago. She noted a pimple on her chin about a week ago and the chin subsequently become more swollen. A couple of days ago, the swelling spread to the left side of her face. She is having a significant amount of pain in her left mandibular area which is radiating to her ear. No c/o tooth ache or cavities. She states that she is still able to express pus from the original pimple on her chin. No c/o fever or chills.   Assessment & Plan   Facial cellulitis and abscess -Patient was on doxycycline as an outpatient, currently on vancomycin -ENT, Dr. Jenne Pane consulted and appreciated- continue IV antibiotics, warm compresses -Appears to be improving -Continue pain control, kpad (directions given to use with towel) -Patient s/p I&D- dressing in place  Acute bronchitis -Likely viral, will continue to follow  Right ankle osteomyelitis -Patient was scheduled for surgery with Dr. [name], on 10/21, however this was canceled due to facial cellulitis  Nicotine abuse -Discussed smoking cessation -Continue nicotine patch  Anemia of chronic disease -baseline Hemoglobin 9-10 ,currently10.1 -Continue to monitor  CBC  Anxiety -Continue Xanax, Wellbutrin and Elavil  Neuropathy -Unknown etiology -patient was to be taking lyrica as an outpatient, will start today  Code Status: Full  Family Communication: Friend at bedside  Disposition Plan: Admitted, S/p I&D.  Expect patient will need continued monitoring and IV antibiotics for another 24-48hrs.  Pending recommendations from ENT, Dr. Jenne Pane.   Time Spent in minutes   30 minutes  Procedures  I&D of right chin abscess 12/19/2014  Consults   ENT, Dr. Jenne Pane  DVT Prophylaxis  Lovenox  Lab Results  Component Value Date   PLT 401* 12/20/2014    Medications  Scheduled Meds: . ALPRAZolam  0.25 mg Oral BID  . ALPRAZolam  0.5 mg Oral QHS  . amitriptyline  10 mg Oral QHS  . buPROPion  100 mg Oral BID  . Chlorhexidine Gluconate Cloth  6 each Topical Daily  . enoxaparin (LOVENOX) injection  40 mg Subcutaneous Q24H  . Influenza vac split quadrivalent PF  0.5 mL Intramuscular Tomorrow-1000  . mupirocin ointment  1 application Nasal BID  . nicotine  7 mg Transdermal Daily  . pregabalin  50 mg Oral TID  .  sodium chloride  3 mL Intravenous Q12H  . vancomycin  1,500 mg Intravenous Q12H   Continuous Infusions:   PRN Meds:.sodium chloride, acetaminophen **OR** acetaminophen, alum & mag hydroxide-simeth, diphenhydrAMINE, morphine injection, ondansetron **OR** ondansetron (ZOFRAN) IV, oxyCODONE, senna-docusate, sodium chloride  Antibiotics    Anti-infectives    Start     Dose/Rate Route Frequency Ordered Stop   12/18/14 2000  vancomycin (VANCOCIN) 1,500 mg in sodium chloride 0.9 % 500 mL IVPB     1,500 mg 250 mL/hr over 120 Minutes Intravenous Every 12 hours 12/18/14 1116     12/18/14 0000  vancomycin (VANCOCIN) IVPB 1000 mg/200 mL premix  Status:  Discontinued     1,000 mg 200 mL/hr over 60 Minutes Intravenous Every 8 hours 12/17/14 1544 12/18/14 1116   12/17/14 1600  vancomycin (VANCOCIN) 2,000 mg in sodium chloride 0.9 % 500 mL IVPB      2,000 mg 250 mL/hr over 120 Minutes Intravenous  Once 12/17/14 1542 12/17/14 1834   12/17/14 1530  penicillin v potassium (VEETID) tablet 500 mg  Status:  Discontinued     500 mg Oral  Once 12/17/14 1519 12/17/14 1534   12/17/14 1149  ceFAZolin (ANCEF) 2-3 GM-% IVPB SOLR    Comments:  Girtha HakeKensmoe, Katherine  : cabinet override      12/17/14 1149 12/17/14 2359   12/17/14 1136  ceFAZolin (ANCEF) IVPB 2 g/50 mL premix  Status:  Discontinued     2 g 100 mL/hr over 30 Minutes Intravenous On call to O.R. 12/17/14 1136 12/17/14 1436        Subjective:   Monique BradfordKimberly Flakes seen and examined today.  Patient continues to complain of pain rightsided facial pain.  She also would like to be started on lyrica.  Denies any chest pain, shortness of breath, abdominal pain.   Objective:   Filed Vitals:   12/19/14 2104 12/19/14 2123 12/20/14 0356 12/20/14 0900  BP:  130/86 119/72 127/74  Pulse: 85 78 82 80  Temp:  98.3 F (36.8 C) 98.5 F (36.9 C) 98.3 F (36.8 C)  TempSrc:  Oral Oral Oral  Resp: 15 16 18 20   Height:      Weight:      SpO2: 100% 98% 96% 97%    Wt Readings from Last 3 Encounters:  12/17/14 83.915 kg (185 lb)  11/25/14 81.647 kg (180 lb)  09/28/14 81.647 kg (180 lb)     Intake/Output Summary (Last 24 hours) at 12/20/14 1031 Last data filed at 12/20/14 0831  Gross per 24 hour  Intake   2420 ml  Output    500 ml  Net   1920 ml    Exam  General: Well developed, well nourished, NAD  HEENT: NCAT, mucous membranes moist.  Dressing in place, erythema and edema noted on right maxillary/mandibular regions.   Cardiovascular: S1 S2 auscultated, RRR, no murmurs  Respiratory: Clear to auscultation  Abdomen: Soft, nontender, nondistended, + bowel sounds  Extremities: warm dry without cyanosis clubbing. Trace edema RLE  Neuro: AAOx3, nonfocal  Psych: Normal affect and demeanor, pleasant  Data Review   Micro Results Recent Results (from the past 240 hour(s))  Surgical  pcr screen     Status: Abnormal   Collection Time: 12/17/14  1:00 PM  Result Value Ref Range Status   MRSA, PCR NEGATIVE NEGATIVE Final   Staphylococcus aureus POSITIVE (A) NEGATIVE Final    Comment:        The Xpert SA Assay (FDA approved for NASAL specimens  in patients over 46 years of age), is one component of a comprehensive surveillance program.  Test performance has been validated by [facility] for patients greater than or equal to 5 year old. It is not intended to diagnose infection nor to guide or monitor treatment.   Culture, routine-abscess     Status: None (Preliminary result)   Collection Time: 12/19/14  9:47 PM  Result Value Ref Range Status   Specimen Description ABSCESS  Final   Special Requests RIGHT CHIN PT ON VANCOMYCIN  Final   Gram Stain PENDING  Incomplete   Culture NO GROWTH Performed at [facility]   Final   Report Status PENDING  Incomplete    Radiology Reports Dg Ankle Complete Right  11/25/2014  CLINICAL DATA:  Chronic ankle fracture. Pain and swelling. Hardware removed 1 month ago. EXAM: RIGHT ANKLE - COMPLETE 3+ VIEW COMPARISON:  09/28/2014 FINDINGS: Bimalleolar ankle fracture has healed. Hardware removal with the exception of a single screw overlying the fibula is unchanged from the prior study. There is diffuse soft tissue swelling. There is mild joint space narrowing in the ankle joint with joint effusion. There is a large area of mixed sclerotic and lytic change in the distal tibia extending to the tibial plafond and which has progressed in appearance from the prior study. This may be an osteochondral fragment. This is difficult to identify on the prior MRI of 08/30/2014 due to extensive artifact from hardware. The hardware has been removed in the interval and follow-up MRI may be helpful. IMPRESSION: Postop bimalleolar ankle fracture with hardware removal. There is a large joint effusion and diffuse soft tissue swelling Large defect in the  distal tibia could represent osteochondral defect. Consider MRI for further evaluation. Electronically Signed   By: Marlan Palau M.D.   On: 11/25/2014 11:55   Mr Ankle Right W Wo Contrast  11/25/2014  CLINICAL DATA:  Infected hardware and chronic septic arthritis. Hardware removed from RIGHT ankle. RIGHT ankle pain. Postoperative infection last year. EXAM: MRI OF THE RIGHT ANKLE WITHOUT AND WITH CONTRAST TECHNIQUE: Multiplanar, multisequence MR imaging of the ankle was performed before and after the administration of intravenous contrast. CONTRAST:  20mL MULTIHANCE GADOBENATE DIMEGLUMINE 529 MG/ML IV SOLN COMPARISON:  None. FINDINGS: Interfragmentary screw in the distal fibula remains present with the attendant artifact. Florid inflammatory changes are present around the ankle. 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 There is a large area of nonenhancing sclerotic bone in the central tibial plafond. 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 Diffuse enhancing synovitis of the ankle joint is present. There is enhancing granulation tissue in the screw tracts along the medial malleolus and at the site of the removed syndesmotic screws. Ankle effusion and periarticular phlegmon is present. There is subchondral sclerosis and partial collapse of the talar dome centrally. This may be secondary to AVN or infection or combination of the 2. Subtalar effusion is present but no convincing evidence of subtalar septic arthritis. Midfoot shows heterogeneous marrow signal likely secondary to disuse osteopenia. Grossly, the posterior medial, posterior lateral and anterior tendons are intact. The Achilles tendon is intact. Plantar fascia is normal. 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 Septic joint effusion and periarticular  phlegmon. 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 No soft tissue abscess. The enhancement along the removed  hardware tract may represent granulation tissue but given the septic arthritis, could also represent other sites of developing osteomyelitis. Electronically Signed   By: Andreas Newport M.D.   On: 11/25/2014 15:53    CBC  Recent Labs Lab 12/17/14 1150 12/18/14 0322 12/19/14 0950 12/20/14 0435  WBC 10.2 10.9* 10.2 10.1  HGB 10.8* 10.1* 11.6* 10.1*  HCT 36.1 33.7* 38.8 33.7*  PLT 402* 357 433* 401*  MCV 78.0 78.9 77.4* 77.5*  MCH 23.3* 23.7* 23.2* 23.2*  MCHC 29.9* 30.0 29.9* 30.0  RDW 16.9* 17.0* 16.5* 16.4*    Chemistries   Recent Labs Lab 12/17/14 1150 12/18/14 0322 12/20/14 0435  NA 133* 135 135  K 4.2 4.0 3.9  CL 105 105 101  CO2 19* 21* 25  GLUCOSE 119* 133* 147*  BUN CREATININE 0.57 0.56 0.70  CALCIUM 9.0 9.0 9.1  AST 18  --   --   ALT 17  --   --   ALKPHOS 57  --   --   BILITOT 0.4  --   --    ------------------------------------------------------------------------------------------------------------------ estimated creatinine clearance is 106.1 mL/min (by C-G formula based on Cr of 0.7). ------------------------------------------------------------------------------------------------------------------ No results for input(s): HGBA1C in the last 72 hours. ------------------------------------------------------------------------------------------------------------------ No results for input(s): CHOL, HDL, LDLCALC, TRIG, CHOLHDL, LDLDIRECT in the last 72 hours. ------------------------------------------------------------------------------------------------------------------ No results for input(s): TSH, T4TOTAL, T3FREE, THYROIDAB in the last 72 hours.  Invalid input(s): FREET3 ------------------------------------------------------------------------------------------------------------------ No results for input(s): VITAMINB12,  FOLATE, FERRITIN, TIBC, IRON, RETICCTPCT in the last 72 hours.  Coagulation profile  Recent Labs Lab 12/17/14 1150  INR 0.99    No results for input(s): DDIMER in the last 72 hours.  Cardiac Enzymes No results for input(s): CKMB, TROPONINI, MYOGLOBIN in the last 168 hours.  Invalid input(s): CK ------------------------------------------------------------------------------------------------------------------ Invalid input(s): POCBNP    Marisa Hufstetler D.O. on 12/20/2014 at 10:31 AM  Between 7am to 7pm - Pager - [phone]  After 7pm go to [email or web address] - password [email or web address]  And look for the night coverage person covering for me after hours  [facility] Office  [phone]

## 2014-12-21 DIAGNOSIS — G629 Polyneuropathy, unspecified: Secondary | ICD-10-CM

## 2014-12-21 LAB — BASIC METABOLIC PANEL
Anion gap: 8 (ref 5–15)
BUN: 11 mg/dL (ref 6–20)
CHLORIDE: 99 mmol/L — AB (ref 101–111)
CO2: 27 mmol/L (ref 22–32)
Calcium: 9.1 mg/dL (ref 8.9–10.3)
Creatinine, Ser: 0.69 mg/dL (ref 0.44–1.00)
GFR calc Af Amer: >60 mL/min (ref >60)
GFR calc non Af Amer: >60 mL/min (ref >60)
GLUCOSE: 115 mg/dL — AB (ref 65–99)
POTASSIUM: 4.2 mmol/L (ref 3.5–5.1)
SODIUM: 134 mmol/L — AB (ref 135–145)

## 2014-12-21 LAB — CBC
HEMATOCRIT: 33.8 % — AB (ref 36.0–46.0)
HEMOGLOBIN: 10.6 g/dL — AB (ref 12.0–15.0)
MCH: 24.1 pg — AB (ref 26.0–34.0)
MCHC: 31.4 g/dL (ref 30.0–36.0)
MCV: 77 fL — AB (ref 78.0–100.0)
Platelets: 436 10*3/uL — ABNORMAL HIGH (ref 150–400)
RBC: 4.39 MIL/uL (ref 3.87–5.11)
RDW: 16.3 % — ABNORMAL HIGH (ref 11.5–15.5)
WBC: 7.1 10*3/uL (ref 4.0–10.5)

## 2014-12-21 NOTE — Progress Notes (Signed)
[facility]                                                                              Patient Demographics  Monique [name], is a 39 y.o. female, DOB - [birth year], ZOX:[id number]  Admit date - 12/17/2014   Admitting Physician Calvert Cantor, MD  Outpatient Primary MD for the patient is [name] Patient  LOS - 4   Chief Complaint  Patient presents with  . Facial Pain      HPI on 12/17/2014 by Dr. Calvert Cantor Monique [name] is a 39 y.o. 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 The patient was seen by Dr. [name] prior to going to the OR today and found to have a cellulitis of her face. Surgery was postponed and the patient was sent down to the ER. She has been on doxycycline for the osteomyelitis but the course was finished a few days ago. She noted a pimple on her chin about a week ago and the chin subsequently become more swollen. A couple of days ago, the swelling spread to the left side of her face. She is having a significant amount of pain in her left mandibular area which is radiating to her ear. No c/o tooth ache or cavities. She states that she is still able to express pus from the original pimple on her chin. No c/o fever or chills.   Assessment & Plan   Facial cellulitis and abscess -Patient was on doxycycline as an outpatient, currently on vancomycin -ENT, Dr. Jenne Pane consulted and appreciated- continue IV antibiotics, warm compresses, s/p I&D with drain.  Dressing removed, no much drainage noted. Drain to be removed 10/26 -Appears to be improving -Continue pain control, kpad (directions given to use with towel)  Acute bronchitis/cough -Likely viral, will continue to follow -Continue antitussives  Right ankle osteomyelitis -Patient was scheduled for surgery with Dr. [name], on 10/21, however this was canceled due to facial cellulitis.  Will need to follow up with Dr. [name] outpatient  Nicotine abuse -Discussed smoking  cessation -Continue nicotine patch  Anemia of chronic disease -baseline Hemoglobin 9-10 ,currently 10.6 -Continue to monitor CBC  Anxiety -Continue Xanax, Wellbutrin and Elavil  Neuropathy -Unknown etiology -Continue lyrica  Code Status: Full  Family Communication: Friend at bedside  Disposition Plan: Admitted, S/p I&D. Drain to be removed 10/26.  Time Spent in minutes   30 minutes  Procedures  I&D of right chin abscess 12/19/2014  Consults   ENT, Dr. Jenne Pane  DVT Prophylaxis  Lovenox  Lab Results  Component Value Date   PLT 436* 12/21/2014    Medications  Scheduled Meds: . ALPRAZolam  0.25 mg Oral BID  . ALPRAZolam  0.5 mg Oral QHS  . amitriptyline  10 mg Oral QHS  . buPROPion  100 mg Oral BID  . Chlorhexidine Gluconate Cloth  6 each Topical Daily  . dextromethorphan-guaiFENesin  2 tablet Oral BID  . enoxaparin (LOVENOX) injection  40 mg Subcutaneous Q24H  . mupirocin ointment  1 application Nasal BID  . nicotine  7 mg Transdermal Daily  . pregabalin  50 mg Oral TID  . sodium chloride  3 mL Intravenous Q12H  .  vancomycin  1,500 mg Intravenous Q12H   Continuous Infusions:   PRN Meds:.sodium chloride, acetaminophen **OR** acetaminophen, alum & mag hydroxide-simeth, diphenhydrAMINE, morphine injection, ondansetron **OR** ondansetron (ZOFRAN) IV, oxyCODONE, senna-docusate, sodium chloride  Antibiotics    Anti-infectives    Start     Dose/Rate Route Frequency Ordered Stop   12/18/14 2000  vancomycin (VANCOCIN) 1,500 mg in sodium chloride 0.9 % 500 mL IVPB     1,500 mg 250 mL/hr over 120 Minutes Intravenous Every 12 hours 12/18/14 1116     12/18/14 0000  vancomycin (VANCOCIN) IVPB 1000 mg/200 mL premix  Status:  Discontinued     1,000 mg 200 mL/hr over 60 Minutes Intravenous Every 8 hours 12/17/14 1544 12/18/14 1116   12/17/14 1600  vancomycin (VANCOCIN) 2,000 mg in sodium chloride 0.9 % 500 mL IVPB     2,000 mg 250 mL/hr over 120 Minutes Intravenous  Once  12/17/14 1542 12/17/14 1834   12/17/14 1530  penicillin v potassium (VEETID) tablet 500 mg  Status:  Discontinued     500 mg Oral  Once 12/17/14 1519 12/17/14 1534   12/17/14 1149  ceFAZolin (ANCEF) 2-3 GM-% IVPB SOLR    Comments:  Girtha HakeKensmoe, Katherine  : cabinet override      12/17/14 1149 12/17/14 2359   12/17/14 1136  ceFAZolin (ANCEF) IVPB 2 g/50 mL premix  Status:  Discontinued     2 g 100 mL/hr over 30 Minutes Intravenous On call to O.R. 12/17/14 1136 12/17/14 1436        Subjective:   Monique BradfordKimberly Groman seen and examined today.  Patient continues to complain of pain rightsided facial pain, but feels it has improved.  She has not noticed any discharge.  Denies any chest pain, shortness of breath, abdominal pain.   Objective:   Filed Vitals:   12/20/14 1757 12/20/14 2032 12/21/14 0431 12/21/14 0859  BP: 126/66 103/50 108/58 96/66  Pulse: 77 78 79 86  Temp: 98.5 F (36.9 C) 98.6 F (37 C) 98.6 F (37 C) 98.3 F (36.8 C)  TempSrc: Oral Oral Oral Oral  Resp: 20 19 18 18   Height:      Weight:  84.1 kg (185 lb 6.5 oz)    SpO2: 98% 97% 97% 98%    Wt Readings from Last 3 Encounters:  12/20/14 84.1 kg (185 lb 6.5 oz)  11/25/14 81.647 kg (180 lb)  09/28/14 81.647 kg (180 lb)     Intake/Output Summary (Last 24 hours) at 12/21/14 1251 Last data filed at 12/21/14 0859  Gross per 24 hour  Intake   2180 ml  Output      0 ml  Net   2180 ml    Exam  General: Well developed, well nourished, NAD  HEENT: NCAT, mucous membranes moist.  Dressing in place, erythema and edema noted on right maxillary/mandibular regions- improved.  TTP.  Drain in place   Cardiovascular: S1 S2 auscultated, RRR, no murmurs  Respiratory: Clear to auscultation bilaterally  Abdomen: Soft, nontender, nondistended, + bowel sounds  Extremities: warm dry without cyanosis clubbing. Trace edema RLE  Neuro: AAOx3, nonfocal  Psych: Normal affect and demeanor, pleasant  Data Review   Micro  Results Recent Results (from the past 240 hour(s))  Surgical pcr screen     Status: Abnormal   Collection Time: 12/17/14  1:00 PM  Result Value Ref Range Status   MRSA, PCR NEGATIVE NEGATIVE Final   Staphylococcus aureus POSITIVE (A) NEGATIVE Final    Comment:  The Xpert SA Assay (FDA approved for NASAL specimens in patients over 44 years of age), is one component of a comprehensive surveillance program.  Test performance has been validated by [facility] for patients greater than or equal to 19 year old. It is not intended to diagnose infection nor to guide or monitor treatment.   Anaerobic culture     Status: None (Preliminary result)   Collection Time: 12/19/14  9:47 PM  Result Value Ref Range Status   Specimen Description ABSCESS  Final   Special Requests RIGHT CHIN PT ON VANCOMYCIN  Final   Gram Stain   Final    NO WBC SEEN NO SQUAMOUS EPITHELIAL CELLS SEEN NO ORGANISMS SEEN Performed at [facility]    Culture   Final    NO ANAEROBES ISOLATED; CULTURE IN PROGRESS FOR 5 DAYS Performed at [facility]    Report Status PENDING  Incomplete  Culture, routine-abscess     Status: None (Preliminary result)   Collection Time: 12/19/14  9:47 PM  Result Value Ref Range Status   Specimen Description ABSCESS  Final   Special Requests RIGHT CHIN PT ON VANCOMYCIN  Final   Gram Stain   Final    NO WBC SEEN NO SQUAMOUS EPITHELIAL CELLS SEEN NO ORGANISMS SEEN Performed at [facility]    Culture   Final    MODERATE STAPHYLOCOCCUS AUREUS Note: RIFAMPIN AND GENTAMICIN SHOULD NOT BE USED AS SINGLE DRUGS FOR TREATMENT OF STAPH INFECTIONS. Performed at [facility]    Report Status PENDING  Incomplete    Radiology Reports Dg Ankle Complete Right  11/25/2014  CLINICAL DATA:  Chronic ankle fracture. Pain and swelling. Hardware removed 1 month ago. EXAM: RIGHT ANKLE - COMPLETE 3+ VIEW COMPARISON:  09/28/2014 FINDINGS: Bimalleolar ankle  fracture has healed. Hardware removal with the exception of a single screw overlying the fibula is unchanged from the prior study. There is diffuse soft tissue swelling. There is mild joint space narrowing in the ankle joint with joint effusion. There is a large area of mixed sclerotic and lytic change in the distal tibia extending to the tibial plafond and which has progressed in appearance from the prior study. This may be an osteochondral fragment. This is difficult to identify on the prior MRI of 08/30/2014 due to extensive artifact from hardware. The hardware has been removed in the interval and follow-up MRI may be helpful. IMPRESSION: Postop bimalleolar ankle fracture with hardware removal. There is a large joint effusion and diffuse soft tissue swelling Large defect in the distal tibia could represent osteochondral defect. Consider MRI for further evaluation. Electronically Signed   By: Marlan Palau M.D.   On: 11/25/2014 11:55   Mr Ankle Right W Wo Contrast  11/25/2014  CLINICAL DATA:  Infected hardware and chronic septic arthritis. Hardware removed from RIGHT ankle. RIGHT ankle pain. Postoperative infection last year. EXAM: MRI OF THE RIGHT ANKLE WITHOUT AND WITH CONTRAST TECHNIQUE: Multiplanar, multisequence MR imaging of the ankle was performed before and after the administration of intravenous contrast. CONTRAST:  20mL MULTIHANCE GADOBENATE DIMEGLUMINE 529 MG/ML IV SOLN COMPARISON:  None. FINDINGS: Interfragmentary screw in the distal fibula remains present with the attendant artifact. Florid inflammatory changes are present around the ankle. 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 There is a large area of nonenhancing sclerotic bone in the central tibial plafond. 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 Diffuse enhancing synovitis of the ankle joint is present. There  is enhancing granulation tissue in the screw tracts along the medial malleolus and at the site of the removed syndesmotic screws. Ankle effusion and periarticular phlegmon is present. There is subchondral sclerosis and partial collapse of the talar dome centrally. This may be secondary to AVN or infection or combination of the 2. Subtalar effusion is present but no convincing evidence of subtalar septic arthritis. Midfoot shows heterogeneous marrow signal likely secondary to disuse osteopenia. Grossly, the posterior medial, posterior lateral and anterior tendons are intact. The Achilles tendon is intact. Plantar fascia is normal. 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 Septic joint effusion and periarticular phlegmon. 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 No soft tissue abscess. The enhancement along the removed hardware tract may represent granulation tissue but given the septic arthritis, could also represent other sites of developing osteomyelitis. Electronically Signed   By: Andreas Newport M.D.   On: 11/25/2014 15:53    CBC  Recent Labs Lab 12/17/14 1150 12/18/14 0322 12/19/14 0950 12/20/14 0435 12/21/14 0705  WBC 10.2 10.9* 10.2 10.1 7.1  HGB 10.8* 10.1* 11.6* 10.1* 10.6*  HCT 36.1 33.7* 38.8 33.7* 33.8*  PLT 402* 357 433* 401* 436*  MCV 78.0 78.9 77.4* 77.5* 77.0*  MCH 23.3* 23.7* 23.2* 23.2* 24.1*  MCHC 29.9* 30.0 29.9* 30.0 31.4  RDW 16.9* 17.0* 16.5* 16.4* 16.3*    Chemistries   Recent Labs Lab 12/17/14 1150 12/18/14 0322 12/20/14 0435 12/21/14 0705  NA 133* 135 135 134*  K 4.2 4.0 3.9 4.2  CL 105 105 101 99*  CO2 19* 21* 25 27  GLUCOSE 119* 133* 147* 115*  BUN 14 12 9 11   CREATININE 0.57 0.56 0.70 0.69  CALCIUM 9.0 9.0 9.1 9.1  AST 18  --   --   --   ALT 17  --   --   --   ALKPHOS 57  --   --   --   BILITOT 0.4  --   --   --     ------------------------------------------------------------------------------------------------------------------ estimated creatinine clearance is 106.3 mL/min (by C-G formula based on Cr of 0.69). ------------------------------------------------------------------------------------------------------------------ No results for input(s): HGBA1C in the last 72 hours. ------------------------------------------------------------------------------------------------------------------ No results for input(s): CHOL, HDL, LDLCALC, TRIG, CHOLHDL, LDLDIRECT in the last 72 hours. ------------------------------------------------------------------------------------------------------------------ No results for input(s): TSH, T4TOTAL, T3FREE, THYROIDAB in the last 72 hours.  Invalid input(s): FREET3 ------------------------------------------------------------------------------------------------------------------ No results for input(s): VITAMINB12, FOLATE, FERRITIN, TIBC, IRON, RETICCTPCT in the last 72 hours.  Coagulation profile  Recent Labs Lab 12/17/14 1150  INR 0.99    No results for input(s): DDIMER in the last 72 hours.  Cardiac Enzymes No results for input(s): CKMB, TROPONINI, MYOGLOBIN in the last 168 hours.  Invalid input(s): CK ------------------------------------------------------------------------------------------------------------------ Invalid input(s): POCBNP    Tyland Klemens D.O. on 12/21/2014 at 12:51 PM  Between 7am to 7pm - Pager - [phone]  After 7pm go to [email or web address] - password [email or web address]  And look for the night coverage person covering for me after hours  [facility] Office  [phone]

## 2014-12-21 NOTE — Anesthesia Postprocedure Evaluation (Signed)
  Anesthesia Post-op Note  Patient: Monique [name]  Procedure(s) Performed: Procedure(s): INCISION AND DRAINAGE OF ABSCESS ON RIGHT SIDE OF CHIN (Right)  Patient Location: PACU  Anesthesia Type:General  Level of Consciousness: awake and alert   Airway and Oxygen Therapy: Patient Spontanous Breathing  Post-op Pain: none  Post-op Assessment: Post-op Vital signs reviewed              Post-op Vital Signs: Reviewed  Last Vitals:  Filed Vitals:   12/21/14 0859  BP: 96/66  Pulse: 86  Temp: 36.8 C  Resp: 18    Complications: No apparent anesthesia complications

## 2014-12-21 NOTE — Progress Notes (Signed)
2 Days Post-Op  Subjective: Chin continues to be sore and swollen.  Objective: Vital signs in last 24 hours: Temp:  [98.3 F (36.8 C)-98.6 F (37 C)] 98.6 F (37 C) (10/25 0431) Pulse Rate:  [77-80] 79 (10/25 0431) Resp:  [18-20] 18 (10/25 0431) BP: (103-127)/(50-74) 108/58 mmHg (10/25 0431) SpO2:  [97 %-98 %] 97 % (10/25 0431) Weight:  [84.1 kg (185 lb 6.5 oz)] 84.1 kg (185 lb 6.5 oz) (10/24 2032) Last BM Date: 12/17/14  Intake/Output from previous day: 10/24 0701 - 10/25 0700 In: 2060 [P.O.:960; IV Piggyback:1000] Out: 0  Intake/Output this shift:    General appearance: alert, cooperative and no distress Head: Dressing changed.  Penrose in place.  Almost no drainage.  Chin remains firm but lower cheek firmness has improved.  Lab Results:   Recent Labs  12/20/14 0435 12/21/14 0705  WBC 10.1 7.1  HGB 10.1* 10.6*  HCT 33.7* 33.8*  PLT 401* 436*   BMET  Recent Labs  12/20/14 0435 12/21/14 0705  NA 135 134*  K 3.9 4.2  CL 101 99*  CO2 25 27  GLUCOSE 147* 115*  BUN 9 11  CREATININE 0.70 0.69  CALCIUM 9.1 9.1   PT/INR No results for input(s): LABPROT, INR in the last 72 hours. ABG No results for input(s): PHART, HCO3 in the last 72 hours.  Invalid input(s): PCO2, PO2  Studies/Results: No results found.  Anti-infectives: Anti-infectives    Start     Dose/Rate Route Frequency Ordered Stop   12/18/14 2000  vancomycin (VANCOCIN) 1,500 mg in sodium chloride 0.9 % 500 mL IVPB     1,500 mg 250 mL/hr over 120 Minutes Intravenous Every 12 hours 12/18/14 1116     12/18/14 0000  vancomycin (VANCOCIN) IVPB 1000 mg/200 mL premix  Status:  Discontinued     1,000 mg 200 mL/hr over 60 Minutes Intravenous Every 8 hours 12/17/14 1544 12/18/14 1116   12/17/14 1600  vancomycin (VANCOCIN) 2,000 mg in sodium chloride 0.9 % 500 mL IVPB     2,000 mg 250 mL/hr over 120 Minutes Intravenous  Once 12/17/14 1542 12/17/14 1834   12/17/14 1530  penicillin v potassium (VEETID)  tablet 500 mg  Status:  Discontinued     500 mg Oral  Once 12/17/14 1519 12/17/14 1534   12/17/14 1149  ceFAZolin (ANCEF) 2-3 GM-% IVPB SOLR    Comments:  Girtha HakeKensmoe, Katherine  : cabinet override      12/17/14 1149 12/17/14 2359   12/17/14 1136  ceFAZolin (ANCEF) IVPB 2 g/50 mL premix  Status:  Discontinued     2 g 100 mL/hr over 30 Minutes Intravenous On call to O.R. 12/17/14 1136 12/17/14 1436      Assessment/Plan: s/p Procedure(s): INCISION AND DRAINAGE OF ABSCESS ON RIGHT SIDE OF CHIN (Right) Continue IV antibiotics.  Culture pending (may not grow anything).  Assuming MRSA.  Will likely remove drain tomorrow and may be improved enough for discharge.  LOS: 4 days    Monique [name] 12/21/2014

## 2014-12-22 DIAGNOSIS — L0390 Cellulitis, unspecified: Secondary | ICD-10-CM

## 2014-12-22 DIAGNOSIS — M86671 Other chronic osteomyelitis, right ankle and foot: Secondary | ICD-10-CM

## 2014-12-22 DIAGNOSIS — F1910 Other psychoactive substance abuse, uncomplicated: Secondary | ICD-10-CM

## 2014-12-22 DIAGNOSIS — L03211 Cellulitis of face: Secondary | ICD-10-CM | POA: Insufficient documentation

## 2014-12-22 DIAGNOSIS — L0291 Cutaneous abscess, unspecified: Secondary | ICD-10-CM

## 2014-12-22 LAB — CULTURE, ROUTINE-ABSCESS: GRAM STAIN: NONE SEEN

## 2014-12-22 LAB — CBC
HEMATOCRIT: 33.9 % — AB (ref 36.0–46.0)
HEMOGLOBIN: 9.8 g/dL — AB (ref 12.0–15.0)
MCH: 23 pg — ABNORMAL LOW (ref 26.0–34.0)
MCHC: 28.9 g/dL — AB (ref 30.0–36.0)
MCV: 79.4 fL (ref 78.0–100.0)
Platelets: 399 10*3/uL (ref 150–400)
RBC: 4.27 MIL/uL (ref 3.87–5.11)
RDW: 16.4 % — ABNORMAL HIGH (ref 11.5–15.5)
WBC: 6.3 10*3/uL (ref 4.0–10.5)

## 2014-12-22 LAB — BASIC METABOLIC PANEL
Anion gap: 12 (ref 5–15)
BUN: 12 mg/dL (ref 6–20)
CHLORIDE: 101 mmol/L (ref 101–111)
CO2: 23 mmol/L (ref 22–32)
CREATININE: 0.67 mg/dL (ref 0.44–1.00)
Calcium: 9.2 mg/dL (ref 8.9–10.3)
GFR calc non Af Amer: >60 mL/min (ref >60)
GLUCOSE: 172 mg/dL — AB (ref 65–99)
Potassium: 3.9 mmol/L (ref 3.5–5.1)
Sodium: 136 mmol/L (ref 135–145)

## 2014-12-22 MED ORDER — CEPHALEXIN 500 MG PO CAPS
500.0000 mg | ORAL_CAPSULE | Freq: Four times a day (QID) | ORAL | Status: DC
Start: 2014-12-22 — End: 2015-01-18

## 2014-12-22 MED ORDER — PREGABALIN 50 MG PO CAPS: 50.0000 mg | ORAL_CAPSULE | Freq: Three times a day (TID) | ORAL | Status: DC

## 2014-12-22 MED ORDER — CEPHALEXIN 500 MG PO CAPS
500.0000 mg | ORAL_CAPSULE | Freq: Four times a day (QID) | ORAL | Status: DC
  Administered 2014-12-22: 500 mg via ORAL
  Filled 2014-12-22: qty 1

## 2014-12-22 MED ORDER — ALPRAZOLAM 0.5 MG PO TABS: 0.5000 mg | ORAL_TABLET | Freq: Two times a day (BID) | ORAL | Status: DC

## 2014-12-22 MED ORDER — AMITRIPTYLINE HCL 10 MG PO TABS: 10.0000 mg | ORAL_TABLET | Freq: Every day | ORAL | Status: DC

## 2014-12-22 MED ORDER — OXYCODONE-ACETAMINOPHEN 5-325 MG PO TABS: 1.0000 | ORAL_TABLET | Freq: Four times a day (QID) | ORAL | Status: DC | PRN

## 2014-12-22 MED ORDER — BUPROPION HCL ER (SR) 100 MG PO TB12: 100.0000 mg | ORAL_TABLET | Freq: Two times a day (BID) | ORAL | Status: DC

## 2014-12-22 NOTE — Discharge Summary (Addendum)
Physician Discharge Summary  Monique FlemingsKimberly E [name] ZOX:[id number] DOB: 10/06/1975 DOA: 12/17/2014  PCP: [name] Patient  Admit date: 12/17/2014 Discharge date: 12/22/2014  Recommendations for Outpatient Follow-up:  1. Pt will need to follow up with PCP in 2 weeks post discharge   Discharge Diagnoses:   Facial cellulitis and abscess -Patient was on doxycycline as an outpatient, currently on vancomycin during hospitalization -ENT, Dr. Jenne PaneBates consulted and appreciated- continue IV antibiotics, warm compresses -I&D on 12/19/14--culture = MSSA -home with cephalexin x 8 more days to finish 14 days of therapy -Continue pain control--no opioids were prescribed at the time of d/c  Acute bronchitis -Likely viral, will continue to follow -stable on RA -cough improved  Right ankle osteomyelitis -Patient was scheduled for surgery with Dr. Lajoyce Cornersuda, on 10/21, however this was canceled due to facial cellulitis -pt to call Dr. Lajoyce Cornersuda to reschedule appt after d/c -Finished 42 days of intravenous vancomycin and ceftriaxone on 11/02/2014  Nicotine abuse -Discussed smoking cessation -Continue nicotine patch  Anemia of chronic disease -baseline Hemoglobin9-10 ,currently10.1 -Continue to monitor CBC  Anxiety -Continue Xanax, Wellbutrin and Elavil  Polysubstance abuse -Including cocaine, THC, tobacco -Patient states that she has been clean since her discharge from the hospital on 10/04/2014 -Tobacco cessation discussed -NicoDerm patch -HIV--neg on 09/01/14  Discharge Condition: stable  Disposition: home Follow-up Information    Follow up with [facility] On 12/28/2014.   Specialty:  Internal Medicine   Why:  Hospital Follow- up appointment to establsih primary care Thursday 12/28/2014 at 2:00p   Contact information:   [address] [place] [id number] [phone]      Diet:regular Wt Readings from Last 3 Encounters:  12/21/14 83.8 kg (184 lb 11.9  oz)  11/25/14 81.647 kg (180 lb)  09/28/14 81.647 kg (180 lb)    History of present illness:  39 y.o. 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 The patient was seen by Dr. Lajoyce Cornersuda prior to going to the OR today and found to have a cellulitis of her face. Surgery was postponed and the patient was sent down to the ER. She has been on doxycycline for the osteomyelitis but the course was finished a few days ago. She noted a pimple on her chin about a week ago and the chin subsequently become more swollen. A couple of days prior to admission, the swelling spread to the left side of her face. She is having a significant amount of pain in her left mandibular area which is radiating to her ear. No c/o tooth ache or cavities. She states that she is still able to express pus from the original pimple on her chin. No c/o fever or chills. The patient was admitted and started on initial venous antibiotics. ENT was consulted, and irrigation debridement was performed on 12/19/2014. Cultures from the debridement showed MSSA. The patient was switched to cephalexin.   Consultants: ENT--Dr. Jenne PaneBates  Discharge Exam: Filed Vitals:   12/22/14 0454  BP: 120/66  Pulse: 75  Temp: 97.9 F (36.6 C)  Resp: 18   Filed Vitals:   12/21/14 0859 12/21/14 1656 12/21/14 2100 12/22/14 0454  BP: 96/66 121/67 116/56 120/66  Pulse: 86 87 70 75  Temp: 98.3 F (36.8 C) 98.4 F (36.9 C) 98.5 F (36.9 C) 97.9 F (36.6 C)  TempSrc: Oral Oral Oral Oral  Resp: 18 18 18 18   Height:      Weight:   83.8 kg (184 lb 11.9  oz)   SpO2: 98% 98% 98% 97%   General: A&O x 3, NAD, pleasant, cooperative Cardiovascular: RRR, no rub, no gallop, no S3; chin with mild edema without any erythema or purulent drainage. Respiratory: CTAB, no wheeze, no rhonchi Abdomen:soft, nontender, nondistended, positive bowel sounds Extremities: No edema, No lymphangitis, no petechiae  Discharge Instructions      Discharge  Instructions    Diet - low sodium heart healthy    Complete by:  As directed      Increase activity slowly    Complete by:  As directed             Medication List    STOP taking these medications        collagenase ointment  Commonly known as:  SANTYL     doxycycline 100 MG capsule  Commonly known as:  VIBRAMYCIN      TAKE these medications        acetaminophen 325 MG tablet  Commonly known as:  TYLENOL  Take 2 tablets (650 mg total) by mouth every 6 (six) hours as needed for mild pain (or Fever >/= 101).     ALKA-SELTZER PO  Take 1 tablet by mouth as needed (for cold).     ALPRAZolam 0.25 MG tablet  Commonly known as:  XANAX  Take 0.25 mg by mouth every morning.     ALPRAZolam 0.5 MG tablet  Commonly known as:  XANAX  Take 1 tablet (0.5 mg total) by mouth 2 (two) times daily. Take 0.5mg  by mouth in the afternoon and take 0.5mg  by mouth at bedtime     amitriptyline 10 MG tablet  Commonly known as:  ELAVIL  Take 1 tablet (10 mg total) by mouth at bedtime.     buPROPion 100 MG 12 hr tablet  Commonly known as:  WELLBUTRIN SR  Take 1 tablet (100 mg total) by mouth 2 (two) times daily.     cephALEXin 500 MG capsule  Commonly known as:  KEFLEX  Take 1 capsule (500 mg total) by mouth every 6 (six) hours.     nicotine 21 mg/24hr patch  Commonly known as:  NICODERM CQ - dosed in mg/24 hours  Place 1 patch (21 mg total) onto the skin daily.     oxyCODONE-acetaminophen 5-325 MG tablet  Commonly known as:  PERCOCET/ROXICET  Take 1 tablet by mouth every 6 (six) hours as needed for severe pain.     pregabalin 50 MG capsule  Commonly known as:  LYRICA  Take 1 capsule (50 mg total) by mouth 3 (three) times daily.         The results of significant diagnostics from this hospitalization (including imaging, microbiology, ancillary and laboratory) are listed below for reference.    Significant Diagnostic Studies: Dg Ankle Complete Right  11/25/2014  CLINICAL DATA:   Chronic ankle fracture. Pain and swelling. Hardware removed 1 month ago. EXAM: RIGHT ANKLE - COMPLETE 3+ VIEW COMPARISON:  09/28/2014 FINDINGS: Bimalleolar ankle fracture has healed. Hardware removal with the exception of a single screw overlying the fibula is unchanged from the prior study. There is diffuse soft tissue swelling. There is mild joint space narrowing in the ankle joint with joint effusion. There is a large area of mixed sclerotic and lytic change in the distal tibia extending to the tibial plafond and which has progressed in appearance from the prior study. This may be an osteochondral fragment. This is difficult to identify on the prior MRI of 08/30/2014 due to  extensive artifact from hardware. The hardware has been removed in the interval and follow-up MRI may be helpful. IMPRESSION: Postop bimalleolar ankle fracture with hardware removal. There is a large joint effusion and diffuse soft tissue swelling Large defect in the distal tibia could represent osteochondral defect. Consider MRI for further evaluation. Electronically Signed   By: Marlan Palau M.D.   On: 11/25/2014 11:55   Mr Ankle Right W Wo Contrast  11/25/2014  CLINICAL DATA:  Infected hardware and chronic septic arthritis. Hardware removed from RIGHT ankle. RIGHT ankle pain. Postoperative infection last year. EXAM: MRI OF THE RIGHT ANKLE WITHOUT AND WITH CONTRAST TECHNIQUE: Multiplanar, multisequence MR imaging of the ankle was performed before and after the administration of intravenous contrast. CONTRAST:  20mL MULTIHANCE GADOBENATE DIMEGLUMINE 529 MG/ML IV SOLN COMPARISON:  None. FINDINGS: Interfragmentary screw in the distal fibula remains present with the attendant artifact. Florid inflammatory changes are present around the ankle. 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 There is a large area of nonenhancing sclerotic bone in the central tibial plafond.  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 Diffuse enhancing synovitis of the ankle joint is present. There is enhancing granulation tissue in the screw tracts along the medial malleolus and at the site of the removed syndesmotic screws. Ankle effusion and periarticular phlegmon is present. There is subchondral sclerosis and partial collapse of the talar dome centrally. This may be secondary to AVN or infection or combination of the 2. Subtalar effusion is present but no convincing evidence of subtalar septic arthritis. Midfoot shows heterogeneous marrow signal likely secondary to disuse osteopenia. Grossly, the posterior medial, posterior lateral and anterior tendons are intact. The Achilles tendon is intact. Plantar fascia is normal. 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 Septic joint effusion and periarticular phlegmon. 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 No soft tissue abscess. The enhancement along the removed hardware tract may represent granulation tissue but given the septic arthritis, could also represent other sites of developing osteomyelitis. Electronically Signed   By: Andreas Newport M.D.   On: 11/25/2014 15:53     Microbiology: Recent Results (from the past 240 hour(s))  Surgical pcr screen     Status: Abnormal   Collection Time: 12/17/14  1:00 PM  Result Value Ref Range Status   MRSA, PCR NEGATIVE NEGATIVE Final   Staphylococcus aureus POSITIVE (A) NEGATIVE Final    Comment:        The Xpert SA Assay (FDA approved for NASAL specimens in patients over 28 years of age), is one component of a comprehensive surveillance program.  Test performance has been validated by [facility] for patients greater than or equal to 39 year old. It is not intended to diagnose infection nor to guide or monitor treatment.   Anaerobic culture      Status: None (Preliminary result)   Collection Time: 12/19/14  9:47 PM  Result Value Ref Range Status   Specimen Description ABSCESS  Final   Special Requests RIGHT CHIN PT ON VANCOMYCIN  Final   Gram Stain   Final    NO WBC SEEN NO SQUAMOUS EPITHELIAL CELLS SEEN NO ORGANISMS SEEN Performed at [facility]    Culture   Final    NO ANAEROBES ISOLATED; CULTURE IN PROGRESS FOR 5 DAYS Performed at [facility]  Report Status PENDING  Incomplete  Culture, routine-abscess     Status: None   Collection Time: 12/19/14  9:47 PM  Result Value Ref Range Status   Specimen Description ABSCESS  Final   Special Requests RIGHT CHIN PT ON VANCOMYCIN  Final   Gram Stain   Final    NO WBC SEEN NO SQUAMOUS EPITHELIAL CELLS SEEN NO ORGANISMS SEEN Performed at [facility]    Culture   Final    MODERATE STAPHYLOCOCCUS AUREUS Note: RIFAMPIN AND GENTAMICIN SHOULD NOT BE USED AS SINGLE DRUGS FOR TREATMENT OF STAPH INFECTIONS. This organism DOES NOT demonstrate inducible Clindamycin resistance in vitro. Performed at [facility]    Report Status 12/22/2014 FINAL  Final   Organism ID, Bacteria STAPHYLOCOCCUS AUREUS  Final      Susceptibility   Staphylococcus aureus - MIC*    CLINDAMYCIN <=0.25 SENSITIVE Sensitive     ERYTHROMYCIN >=8 RESISTANT Resistant     GENTAMICIN <=0.5 SENSITIVE Sensitive     LEVOFLOXACIN 4 INTERMEDIATE Intermediate     OXACILLIN 1 SENSITIVE Sensitive     RIFAMPIN <=0.5 SENSITIVE Sensitive     TRIMETH/SULFA <=10 SENSITIVE Sensitive     VANCOMYCIN 1 SENSITIVE Sensitive     TETRACYCLINE <=1 SENSITIVE Sensitive     MOXIFLOXACIN 2 RESISTANT Resistant     * MODERATE STAPHYLOCOCCUS AUREUS     Labs: Basic Metabolic Panel:  Recent Labs Lab 12/17/14 1150 12/18/14 0322 12/20/14 0435 12/21/14 0705 12/22/14 0358  NA 133* 135 135 134* 136  K 4.2 4.0 3.9 4.2 3.9  CL 105 105 101 99* 101  CO2 19* 21* GLUCOSE 119* 133* 147* 115*  172*  BUN CREATININE 0.57 0.56 0.70 0.69 0.67  CALCIUM 9.0 9.0 9.1 9.1 9.2   Liver Function Tests:  Recent Labs Lab 12/17/14 1150  AST 18  ALT 17  ALKPHOS 57  BILITOT 0.4  PROT 7.2  ALBUMIN 3.8   No results for input(s): LIPASE, AMYLASE in the last 168 hours. No results for input(s): AMMONIA in the last 168 hours. CBC:  Recent Labs Lab 12/18/14 0322 12/19/14 0950 12/20/14 0435 12/21/14 0705 12/22/14 0358  WBC 10.9* 10.2 10.1 7.1 6.3  HGB 10.1* 11.6* 10.1* 10.6* 9.8*  HCT 33.7* 38.8 33.7* 33.8* 33.9*  MCV 78.9 77.4* 77.5* 77.0* 79.4  PLT 357 433* 401* 436* 399   Cardiac Enzymes: No results for input(s): CKTOTAL, CKMB, CKMBINDEX, TROPONINI in the last 168 hours. BNP: Invalid input(s): POCBNP CBG: No results for input(s): GLUCAP in the last 168 hours.  Time coordinating discharge:  Greater than 30 minutes  Signed:  Carnetta Losada, DO [facility] Pager: [phone] 12/22/2014, 2:56 PM

## 2014-12-22 NOTE — Progress Notes (Signed)
3 Days Post-Op  Subjective: Right chin remains sore but swelling seems to be improving.  Still not much drainage from drain site.  Objective: Vital signs in last 24 hours: Temp:  [97.9 F (36.6 C)-98.5 F (36.9 C)] 97.9 F (36.6 C) (10/26 0454) Pulse Rate:  [70-87] 75 (10/26 0454) Resp:  [18] 18 (10/26 0454) BP: (116-121)/(56-67) 120/66 mmHg (10/26 0454) SpO2:  [97 %-98 %] 97 % (10/26 0454) Weight:  [83.8 kg (184 lb 11.9 oz)] 83.8 kg (184 lb 11.9 oz) (10/25 2100) Last BM Date: 12/21/14  Intake/Output from previous day: 10/25 0701 - 10/26 0700 In: 2200 [P.O.:1200; IV Piggyback:1000] Out: 1 [Urine:1] Intake/Output this shift:    General appearance: alert, cooperative and no distress Head: Penrose drain in place, removed.  Chin remains firmly edematous with tenderness, right lower cheek firmness improved.  Lab Results:   Recent Labs  12/21/14 0705 12/22/14 0358  WBC 7.1 6.3  HGB 10.6* 9.8*  HCT 33.8* 33.9*  PLT 436* 399   BMET  Recent Labs  12/21/14 0705 12/22/14 0358  NA 134* 136  K 4.2 3.9  CL 99* 101  CO2 27 23  GLUCOSE 115* 172*  BUN 11 12  CREATININE 0.69 0.67  CALCIUM 9.1 9.2   PT/INR No results for input(s): LABPROT, INR in the last 72 hours. ABG No results for input(s): PHART, HCO3 in the last 72 hours.  Invalid input(s): PCO2, PO2  Studies/Results: No results found.  Anti-infectives: Anti-infectives    Start     Dose/Rate Route Frequency Ordered Stop   12/18/14 2000  vancomycin (VANCOCIN) 1,500 mg in sodium chloride 0.9 % 500 mL IVPB     1,500 mg 250 mL/hr over 120 Minutes Intravenous Every 12 hours 12/18/14 1116     12/18/14 0000  vancomycin (VANCOCIN) IVPB 1000 mg/200 mL premix  Status:  Discontinued     1,000 mg 200 mL/hr over 60 Minutes Intravenous Every 8 hours 12/17/14 1544 12/18/14 1116   12/17/14 1600  vancomycin (VANCOCIN) 2,000 mg in sodium chloride 0.9 % 500 mL IVPB     2,000 mg 250 mL/hr over 120 Minutes Intravenous  Once  12/17/14 1542 12/17/14 1834   12/17/14 1530  penicillin v potassium (VEETID) tablet 500 mg  Status:  Discontinued     500 mg Oral  Once 12/17/14 1519 12/17/14 1534   12/17/14 1149  ceFAZolin (ANCEF) 2-3 GM-% IVPB SOLR    Comments:  Girtha HakeKensmoe, Katherine  : cabinet override      12/17/14 1149 12/17/14 2359   12/17/14 1136  ceFAZolin (ANCEF) IVPB 2 g/50 mL premix  Status:  Discontinued     2 g 100 mL/hr over 30 Minutes Intravenous On call to O.R. 12/17/14 1136 12/17/14 1436      Assessment/Plan: s/p Procedure(s): INCISION AND DRAINAGE OF ABSCESS ON RIGHT SIDE OF CHIN (Right) Removed Penrose drain.  Improving clinically.  Culture grew sensitive staph.  Can discharge on oral antibiotics, doxycycline for 10 days perhaps.  Follow-up with me as needed.  LOS: 5 days    Erine Phenix 12/22/2014

## 2014-12-22 NOTE — Progress Notes (Signed)
Monique FaceKimberly E [name] to be D/C'd Home per MD order.  Discussed prescriptions and follow up appointments with the patient. Prescriptions given to patient, medication list explained in detail. Pt verbalized understanding.    Medication List    STOP taking these medications        collagenase ointment  Commonly known as:  SANTYL     doxycycline 100 MG capsule  Commonly known as:  VIBRAMYCIN     oxyCODONE-acetaminophen 5-325 MG tablet  Commonly known as:  PERCOCET/ROXICET      TAKE these medications        acetaminophen 325 MG tablet  Commonly known as:  TYLENOL  Take 2 tablets (650 mg total) by mouth every 6 (six) hours as needed for mild pain (or Fever >/= 101).     ALKA-SELTZER PO  Take 1 tablet by mouth as needed (for cold).     ALPRAZolam 0.25 MG tablet  Commonly known as:  XANAX  Take 0.25 mg by mouth every morning.     ALPRAZolam 0.5 MG tablet  Commonly known as:  XANAX  Take 1 tablet (0.5 mg total) by mouth 2 (two) times daily. Take 0.5mg  by mouth in the afternoon and take 0.5mg  by mouth at bedtime     amitriptyline 10 MG tablet  Commonly known as:  ELAVIL  Take 1 tablet (10 mg total) by mouth at bedtime.     buPROPion 100 MG 12 hr tablet  Commonly known as:  WELLBUTRIN SR  Take 1 tablet (100 mg total) by mouth 2 (two) times daily.     cephALEXin 500 MG capsule  Commonly known as:  KEFLEX  Take 1 capsule (500 mg total) by mouth every 6 (six) hours.     nicotine 21 mg/24hr patch  Commonly known as:  NICODERM CQ - dosed in mg/24 hours  Place 1 patch (21 mg total) onto the skin daily.     pregabalin 50 MG capsule  Commonly known as:  LYRICA  Take 1 capsule (50 mg total) by mouth 3 (three) times daily.        Filed Vitals:   12/22/14 0454  BP: 120/66  Pulse: 75  Temp: 97.9 F (36.6 C)  Resp: 18    Skin clean, dry and intact without evidence of skin break down, no evidence of skin tears noted. IV catheter discontinued intact. Site without signs and  symptoms of complications. Dressing and pressure applied. Pt denies pain at this time. No complaints noted.  An After Visit Summary was printed and given to the patient. Patient also received education material on cellulitis. Patient ambulated off the unit, and D/C home via private auto.  Monique ForehandLuke Calvary [name] BSN, RN

## 2014-12-22 NOTE — Care Management Note (Signed)
Case Management Note  Patient Details  Name: Monique [name] MRN: [id number] Date of Birth: 06/12/1975  Subjective/Objective:        CM following for progression and d/c planning.            Action/Plan: 12/22/2014 Noted that ED Case manager has worked with this pt and pt has a followup appointment scheduled. No further needs identified.  Expected Discharge Date:    12/22/2014              Expected Discharge Plan:  Home/Self Care  In-House Referral:     Discharge planning Services  Indigent [facility] Clinic  Post Acute Care Choice:    Choice offered to:  Patient  DME Arranged:    DME Agency:     HH Arranged:    HH Agency:     Status of Service:  Completed, signed off  Medicare Important Message Given:    Date Medicare IM Given:    Medicare IM give by:    Date Additional Medicare IM Given:    Additional Medicare Important Message give by:     If discussed at Long Length of Stay Meetings, dates discussed:    Additional Comments:  Starlyn SkeansRoyal, Drayk Humbarger U, RN 12/22/2014, 4:11 PM

## 2014-12-23 ENCOUNTER — Ambulatory Visit: Payer: Self-pay | Admitting: Family Medicine

## 2014-12-24 LAB — ANAEROBIC CULTURE: Gram Stain: NONE SEEN

## 2014-12-28 ENCOUNTER — Ambulatory Visit: Payer: Self-pay | Admitting: Family Medicine

## 2014-12-31 ENCOUNTER — Emergency Department (HOSPITAL_COMMUNITY): Payer: Self-pay

## 2014-12-31 ENCOUNTER — Encounter (HOSPITAL_COMMUNITY): Payer: Self-pay | Admitting: Emergency Medicine

## 2014-12-31 ENCOUNTER — Emergency Department (HOSPITAL_COMMUNITY)
Admission: EM | Admit: 2014-12-31 | Discharge: 2014-12-31 | Disposition: A | Discharge status: Home / Self Care | Payer: Self-pay | Attending: Emergency Medicine | Admitting: Emergency Medicine

## 2014-12-31 DIAGNOSIS — Z8614 Personal history of Methicillin resistant Staphylococcus aureus infection: Secondary | ICD-10-CM | POA: Insufficient documentation

## 2014-12-31 DIAGNOSIS — Z8781 Personal history of (healed) traumatic fracture: Secondary | ICD-10-CM | POA: Insufficient documentation

## 2014-12-31 DIAGNOSIS — F419 Anxiety disorder, unspecified: Secondary | ICD-10-CM | POA: Insufficient documentation

## 2014-12-31 DIAGNOSIS — Z9889 Other specified postprocedural states: Secondary | ICD-10-CM | POA: Insufficient documentation

## 2014-12-31 DIAGNOSIS — Z720 Tobacco use: Secondary | ICD-10-CM | POA: Insufficient documentation

## 2014-12-31 DIAGNOSIS — G43909 Migraine, unspecified, not intractable, without status migrainosus: Secondary | ICD-10-CM | POA: Insufficient documentation

## 2014-12-31 DIAGNOSIS — F319 Bipolar disorder, unspecified: Secondary | ICD-10-CM | POA: Insufficient documentation

## 2014-12-31 DIAGNOSIS — G8929 Other chronic pain: Secondary | ICD-10-CM | POA: Insufficient documentation

## 2014-12-31 DIAGNOSIS — Z79899 Other long term (current) drug therapy: Secondary | ICD-10-CM | POA: Insufficient documentation

## 2014-12-31 DIAGNOSIS — M1990 Unspecified osteoarthritis, unspecified site: Secondary | ICD-10-CM | POA: Insufficient documentation

## 2014-12-31 DIAGNOSIS — Z8719 Personal history of other diseases of the digestive system: Secondary | ICD-10-CM | POA: Insufficient documentation

## 2014-12-31 DIAGNOSIS — M25571 Pain in right ankle and joints of right foot: Secondary | ICD-10-CM | POA: Insufficient documentation

## 2014-12-31 MED ORDER — ACETAMINOPHEN 325 MG PO TABS
650.0000 mg | ORAL_TABLET | Freq: Once | ORAL | Status: AC
  Administered 2014-12-31: 650 mg via ORAL
  Filled 2014-12-31: qty 2

## 2014-12-31 MED ORDER — IBUPROFEN 800 MG PO TABS: 800.0000 mg | ORAL_TABLET | Freq: Three times a day (TID) | ORAL | Status: DC

## 2014-12-31 NOTE — ED Notes (Signed)
Per [facility]-states [employer] called [facility] in regards to a 10-96-patient appears intoxicated-states people have a hit on her-states she needed to get out of that part of town
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
## 2014-12-31 NOTE — ED Notes (Signed)
Pt is very sleepy, difficult to arouse. Appropriately responsive and cooperative

## 2014-12-31 NOTE — ED Notes (Signed)
Per pt, states right ankle pain-states she missed appointment for follow-up-suppose to have surgery on it for the third time

## 2014-12-31 NOTE — Discharge Instructions (Signed)

## 2014-12-31 NOTE — ED Notes (Signed)
AVS explained in detail. Knows to follow up with PCP. Ace wrap applied. Bus ticket/sandwich/sprite given. No other c/c.

## 2015-01-05 ENCOUNTER — Other Ambulatory Visit (HOSPITAL_COMMUNITY): Payer: Self-pay | Admitting: Orthopedic Surgery

## 2015-01-18 ENCOUNTER — Encounter (HOSPITAL_COMMUNITY): Payer: Self-pay | Admitting: *Deleted

## 2015-01-18 MED ORDER — CHLORHEXIDINE GLUCONATE 4 % EX LIQD: 60.0000 mL | Freq: Once | CUTANEOUS | Status: DC

## 2015-01-18 MED ORDER — CEFAZOLIN SODIUM-DEXTROSE 2-3 GM-% IV SOLR
2.0000 g | INTRAVENOUS | Status: AC
  Administered 2015-01-19: 2 g via INTRAVENOUS
  Filled 2015-01-18: qty 50

## 2015-01-18 NOTE — Progress Notes (Signed)
Pt denies SOB, chest pain, and being under the care of a cardiologist. Pt denies having a stress test, echo and cardiac cath. 

## 2015-01-19 ENCOUNTER — Inpatient Hospital Stay (HOSPITAL_COMMUNITY): Payer: Medicaid Other | Admitting: Certified Registered Nurse Anesthetist

## 2015-01-19 ENCOUNTER — Encounter (HOSPITAL_COMMUNITY)
Admission: RE | Disposition: A | Discharge status: Home / Self Care | Payer: Self-pay | Source: Ambulatory Visit | Attending: Orthopedic Surgery

## 2015-01-19 ENCOUNTER — Observation Stay (HOSPITAL_COMMUNITY)
Admission: RE | Admit: 2015-01-19 | Discharge: 2015-01-20 | Disposition: A | Discharge status: Home / Self Care | Payer: Medicaid Other | Source: Ambulatory Visit | Attending: Orthopedic Surgery | Admitting: Orthopedic Surgery

## 2015-01-19 DIAGNOSIS — M00871 Arthritis due to other bacteria, right ankle and foot: Secondary | ICD-10-CM | POA: Insufficient documentation

## 2015-01-19 DIAGNOSIS — D649 Anemia, unspecified: Secondary | ICD-10-CM | POA: Diagnosis not present

## 2015-01-19 DIAGNOSIS — F17210 Nicotine dependence, cigarettes, uncomplicated: Secondary | ICD-10-CM | POA: Insufficient documentation

## 2015-01-19 DIAGNOSIS — K219 Gastro-esophageal reflux disease without esophagitis: Secondary | ICD-10-CM | POA: Insufficient documentation

## 2015-01-19 DIAGNOSIS — F319 Bipolar disorder, unspecified: Secondary | ICD-10-CM | POA: Diagnosis not present

## 2015-01-19 DIAGNOSIS — Z981 Arthrodesis status: Secondary | ICD-10-CM

## 2015-01-19 DIAGNOSIS — A4902 Methicillin resistant Staphylococcus aureus infection, unspecified site: Secondary | ICD-10-CM | POA: Insufficient documentation

## 2015-01-19 DIAGNOSIS — F419 Anxiety disorder, unspecified: Secondary | ICD-10-CM | POA: Insufficient documentation

## 2015-01-19 DIAGNOSIS — J45909 Unspecified asthma, uncomplicated: Secondary | ICD-10-CM | POA: Insufficient documentation

## 2015-01-19 DIAGNOSIS — M1990 Unspecified osteoarthritis, unspecified site: Secondary | ICD-10-CM | POA: Insufficient documentation

## 2015-01-19 DIAGNOSIS — A5216 Charcot's arthropathy (tabetic): Secondary | ICD-10-CM | POA: Diagnosis not present

## 2015-01-19 HISTORY — PX: EXTERNAL FIXATION LEG: SHX1549

## 2015-01-19 HISTORY — DX: Unspecified infectious disease: B99.9

## 2015-01-19 HISTORY — PX: ANKLE FUSION: SHX5718

## 2015-01-19 HISTORY — DX: Unspecified asthma, uncomplicated: J45.909

## 2015-01-19 LAB — APTT: APTT: 29 s (ref 24–37)

## 2015-01-19 LAB — CBC
HCT: 33.7 % — ABNORMAL LOW (ref 36.0–46.0)
HEMOGLOBIN: 10.4 g/dL — AB (ref 12.0–15.0)
MCH: 24.5 pg — AB (ref 26.0–34.0)
MCHC: 30.9 g/dL (ref 30.0–36.0)
MCV: 79.5 fL (ref 78.0–100.0)
PLATELETS: 395 10*3/uL (ref 150–400)
RBC: 4.24 MIL/uL (ref 3.87–5.11)
RDW: 18.2 % — ABNORMAL HIGH (ref 11.5–15.5)
WBC: 7 10*3/uL (ref 4.0–10.5)

## 2015-01-19 LAB — COMPREHENSIVE METABOLIC PANEL
ALT: 28 U/L (ref 14–54)
ANION GAP: 7 (ref 5–15)
AST: 19 U/L (ref 15–41)
Albumin: 3.6 g/dL (ref 3.5–5.0)
Alkaline Phosphatase: 54 U/L (ref 38–126)
BUN: 11 mg/dL (ref 6–20)
CALCIUM: 9.2 mg/dL (ref 8.9–10.3)
CO2: 24 mmol/L (ref 22–32)
Chloride: 107 mmol/L (ref 101–111)
Creatinine, Ser: 0.61 mg/dL (ref 0.44–1.00)
GFR calc non Af Amer: >60 mL/min (ref >60)
Glucose, Bld: 121 mg/dL — ABNORMAL HIGH (ref 65–99)
Potassium: 3.7 mmol/L (ref 3.5–5.1)
SODIUM: 138 mmol/L (ref 135–145)
Total Bilirubin: 0.5 mg/dL (ref 0.3–1.2)
Total Protein: 7.3 g/dL (ref 6.5–8.1)

## 2015-01-19 LAB — HCG, SERUM, QUALITATIVE: PREG SERUM: NEGATIVE

## 2015-01-19 LAB — PROTIME-INR
INR: 0.96 (ref 0.00–1.49)
PROTHROMBIN TIME: 13 s (ref 11.6–15.2)

## 2015-01-19 SURGERY — ANKLE FUSION
Anesthesia: General | Laterality: Right

## 2015-01-19 MED ORDER — LACTATED RINGERS IV SOLN
INTRAVENOUS | Status: DC | PRN
  Administered 2015-01-19: 10:00:00 via INTRAVENOUS

## 2015-01-19 MED ORDER — GENTAMICIN SULFATE 40 MG/ML IJ SOLN
INTRAMUSCULAR | Status: DC | PRN
  Administered 2015-01-19: 80 mg via INTRAMUSCULAR

## 2015-01-19 MED ORDER — ONDANSETRON HCL 4 MG PO TABS
4.0000 mg | ORAL_TABLET | Freq: Four times a day (QID) | ORAL | Status: DC | PRN
Start: 2015-01-19 — End: 2015-01-20

## 2015-01-19 MED ORDER — MIDAZOLAM HCL 5 MG/5ML IJ SOLN
INTRAMUSCULAR | Status: DC | PRN
  Administered 2015-01-19 (×2): 2 mg via INTRAVENOUS

## 2015-01-19 MED ORDER — DEXAMETHASONE SODIUM PHOSPHATE 4 MG/ML IJ SOLN
INTRAMUSCULAR | Status: DC | PRN
  Administered 2015-01-19: 4 mg via INTRAVENOUS

## 2015-01-19 MED ORDER — GLYCOPYRROLATE 0.2 MG/ML IJ SOLN
INTRAMUSCULAR | Status: AC
  Filled 2015-01-19: qty 1

## 2015-01-19 MED ORDER — SODIUM CHLORIDE 0.9 % IV SOLN
INTRAVENOUS | Status: DC
  Administered 2015-01-19: 10 mL/h via INTRAVENOUS

## 2015-01-19 MED ORDER — MIDAZOLAM HCL 2 MG/2ML IJ SOLN
INTRAMUSCULAR | Status: AC
  Filled 2015-01-19: qty 2

## 2015-01-19 MED ORDER — OXYCODONE HCL 5 MG PO TABS
ORAL_TABLET | ORAL | Status: AC
  Administered 2015-01-19: 5 mg via ORAL
  Filled 2015-01-19: qty 1

## 2015-01-19 MED ORDER — ACETAMINOPHEN 160 MG/5ML PO SOLN
325.0000 mg | ORAL | Status: DC | PRN
  Filled 2015-01-19: qty 20.3

## 2015-01-19 MED ORDER — DEXAMETHASONE SODIUM PHOSPHATE 4 MG/ML IJ SOLN
INTRAMUSCULAR | Status: AC
  Filled 2015-01-19: qty 1

## 2015-01-19 MED ORDER — ONDANSETRON HCL 4 MG/2ML IJ SOLN
INTRAMUSCULAR | Status: DC | PRN
  Administered 2015-01-19: 4 mg via INTRAVENOUS

## 2015-01-19 MED ORDER — ACETAMINOPHEN 325 MG PO TABS
650.0000 mg | ORAL_TABLET | Freq: Four times a day (QID) | ORAL | Status: DC | PRN
  Administered 2015-01-20: 650 mg via ORAL
  Filled 2015-01-19: qty 2

## 2015-01-19 MED ORDER — HYDROMORPHONE HCL 1 MG/ML IJ SOLN: 1.0000 mg | INTRAMUSCULAR | Status: DC | PRN

## 2015-01-19 MED ORDER — FENTANYL CITRATE (PF) 250 MCG/5ML IJ SOLN
INTRAMUSCULAR | Status: AC
  Filled 2015-01-19: qty 5

## 2015-01-19 MED ORDER — CEFAZOLIN SODIUM 1-5 GM-% IV SOLN
1.0000 g | Freq: Four times a day (QID) | INTRAVENOUS | Status: AC
  Administered 2015-01-19 – 2015-01-20 (×3): 1 g via INTRAVENOUS
  Filled 2015-01-19 (×3): qty 50

## 2015-01-19 MED ORDER — METHOCARBAMOL 500 MG PO TABS
ORAL_TABLET | ORAL | Status: AC
  Filled 2015-01-19: qty 1

## 2015-01-19 MED ORDER — ALPRAZOLAM 0.5 MG PO TABS
0.5000 mg | ORAL_TABLET | Freq: Three times a day (TID) | ORAL | Status: DC
  Administered 2015-01-19 – 2015-01-20 (×4): 0.5 mg via ORAL
  Filled 2015-01-19 (×3): qty 1

## 2015-01-19 MED ORDER — VANCOMYCIN HCL 1000 MG IV SOLR
INTRAVENOUS | Status: DC | PRN
  Administered 2015-01-19: 1000 mg

## 2015-01-19 MED ORDER — ALPRAZOLAM 0.25 MG PO TABS
ORAL_TABLET | ORAL | Status: AC
  Administered 2015-01-19: 0.5 mg via ORAL
  Filled 2015-01-19: qty 2

## 2015-01-19 MED ORDER — METOCLOPRAMIDE HCL 5 MG PO TABS: 5.0000 mg | ORAL_TABLET | Freq: Three times a day (TID) | ORAL | Status: DC | PRN

## 2015-01-19 MED ORDER — PREGABALIN 50 MG PO CAPS
50.0000 mg | ORAL_CAPSULE | Freq: Three times a day (TID) | ORAL | Status: DC
  Administered 2015-01-19 – 2015-01-20 (×3): 50 mg via ORAL
  Filled 2015-01-19 (×2): qty 1

## 2015-01-19 MED ORDER — GENTAMICIN SULFATE 40 MG/ML IJ SOLN
INTRAMUSCULAR | Status: AC
  Filled 2015-01-19: qty 6

## 2015-01-19 MED ORDER — ASPIRIN EC 325 MG PO TBEC
325.0000 mg | DELAYED_RELEASE_TABLET | Freq: Every day | ORAL | Status: DC
  Administered 2015-01-19 – 2015-01-20 (×2): 325 mg via ORAL
  Filled 2015-01-19: qty 1

## 2015-01-19 MED ORDER — FENTANYL CITRATE (PF) 100 MCG/2ML IJ SOLN
INTRAMUSCULAR | Status: AC
  Filled 2015-01-19: qty 2

## 2015-01-19 MED ORDER — BUPROPION HCL ER (SR) 100 MG PO TB12
100.0000 mg | ORAL_TABLET | Freq: Two times a day (BID) | ORAL | Status: DC
  Administered 2015-01-19 – 2015-01-20 (×3): 100 mg via ORAL
  Filled 2015-01-19 (×4): qty 1

## 2015-01-19 MED ORDER — ACETAMINOPHEN 325 MG PO TABS: 325.0000 mg | ORAL_TABLET | ORAL | Status: DC | PRN

## 2015-01-19 MED ORDER — OXYCODONE HCL 5 MG PO TABS
5.0000 mg | ORAL_TABLET | Freq: Once | ORAL | Status: AC | PRN
  Administered 2015-01-19: 5 mg via ORAL

## 2015-01-19 MED ORDER — PROPOFOL 10 MG/ML IV BOLUS
INTRAVENOUS | Status: DC | PRN
  Administered 2015-01-19: 200 mg via INTRAVENOUS

## 2015-01-19 MED ORDER — FENTANYL CITRATE (PF) 100 MCG/2ML IJ SOLN
INTRAMUSCULAR | Status: DC | PRN
  Administered 2015-01-19: 100 ug via INTRAVENOUS
  Administered 2015-01-19: 50 ug via INTRAVENOUS
  Administered 2015-01-19: 100 ug via INTRAVENOUS

## 2015-01-19 MED ORDER — ONDANSETRON HCL 4 MG/2ML IJ SOLN: 4.0000 mg | Freq: Four times a day (QID) | INTRAMUSCULAR | Status: DC | PRN

## 2015-01-19 MED ORDER — ACETAMINOPHEN 650 MG RE SUPP: 650.0000 mg | Freq: Four times a day (QID) | RECTAL | Status: DC | PRN

## 2015-01-19 MED ORDER — OXYCODONE HCL 5 MG PO TABS
5.0000 mg | ORAL_TABLET | ORAL | Status: DC | PRN
  Administered 2015-01-19 – 2015-01-20 (×6): 10 mg via ORAL
  Filled 2015-01-19 (×6): qty 2

## 2015-01-19 MED ORDER — VANCOMYCIN HCL 1000 MG IV SOLR
INTRAVENOUS | Status: AC
  Filled 2015-01-19: qty 1000

## 2015-01-19 MED ORDER — METHOCARBAMOL 500 MG PO TABS
500.0000 mg | ORAL_TABLET | Freq: Four times a day (QID) | ORAL | Status: DC | PRN
  Administered 2015-01-19 – 2015-01-20 (×3): 500 mg via ORAL
  Filled 2015-01-19 (×2): qty 1

## 2015-01-19 MED ORDER — PHENYLEPHRINE HCL 10 MG/ML IJ SOLN
INTRAMUSCULAR | Status: DC | PRN
  Administered 2015-01-19: 40 ug via INTRAVENOUS
  Administered 2015-01-19 (×2): 80 ug via INTRAVENOUS
  Administered 2015-01-19 (×2): 40 ug via INTRAVENOUS

## 2015-01-19 MED ORDER — BUPIVACAINE-EPINEPHRINE (PF) 0.5% -1:200000 IJ SOLN
INTRAMUSCULAR | Status: DC | PRN
  Administered 2015-01-19: 30 mL via PERINEURAL
  Administered 2015-01-19: 15 mL via PERINEURAL

## 2015-01-19 MED ORDER — OXYCODONE HCL 5 MG/5ML PO SOLN: 5.0000 mg | Freq: Once | ORAL | Status: AC | PRN

## 2015-01-19 MED ORDER — LIDOCAINE HCL (CARDIAC) 20 MG/ML IV SOLN
INTRAVENOUS | Status: DC | PRN
  Administered 2015-01-19: 60 mg via INTRAVENOUS

## 2015-01-19 MED ORDER — METOCLOPRAMIDE HCL 5 MG/ML IJ SOLN: 5.0000 mg | Freq: Three times a day (TID) | INTRAMUSCULAR | Status: DC | PRN

## 2015-01-19 MED ORDER — NICOTINE 21 MG/24HR TD PT24
21.0000 mg | MEDICATED_PATCH | Freq: Every day | TRANSDERMAL | Status: DC
  Administered 2015-01-19 – 2015-01-20 (×2): 21 mg via TRANSDERMAL
  Filled 2015-01-19 (×2): qty 1

## 2015-01-19 MED ORDER — LACTATED RINGERS IV SOLN: INTRAVENOUS | Status: DC

## 2015-01-19 MED ORDER — PHENYLEPHRINE 40 MCG/ML (10ML) SYRINGE FOR IV PUSH (FOR BLOOD PRESSURE SUPPORT)
PREFILLED_SYRINGE | INTRAVENOUS | Status: AC
  Filled 2015-01-19: qty 10

## 2015-01-19 MED ORDER — 0.9 % SODIUM CHLORIDE (POUR BTL) OPTIME
TOPICAL | Status: DC | PRN
  Administered 2015-01-19: 1000 mL

## 2015-01-19 MED ORDER — AMITRIPTYLINE HCL 10 MG PO TABS
10.0000 mg | ORAL_TABLET | Freq: Every day | ORAL | Status: DC
  Administered 2015-01-19: 10 mg via ORAL
  Filled 2015-01-19 (×2): qty 1

## 2015-01-19 MED ORDER — HYDROMORPHONE HCL 1 MG/ML IJ SOLN: 0.2500 mg | INTRAMUSCULAR | Status: DC | PRN

## 2015-01-19 MED ORDER — METHOCARBAMOL 1000 MG/10ML IJ SOLN
500.0000 mg | Freq: Four times a day (QID) | INTRAVENOUS | Status: DC | PRN
  Filled 2015-01-19: qty 5

## 2015-01-19 SURGICAL SUPPLY — 59 items
BAG DECANTER FOR FLEXI CONT (MISCELLANEOUS) IMPLANT
BANDAGE ELASTIC 4 VELCRO ST LF (GAUZE/BANDAGES/DRESSINGS) ×3 IMPLANT
BANDAGE ELASTIC 6 VELCRO ST LF (GAUZE/BANDAGES/DRESSINGS) ×3 IMPLANT
BANDAGE ESMARK 6X9 LF (GAUZE/BANDAGES/DRESSINGS) ×1 IMPLANT
BLADE SAW SGTL HD 18.5X60.5X1. (BLADE) ×3 IMPLANT
BLADE SURG 10 STRL SS (BLADE) IMPLANT
BNDG CMPR 9X6 STRL LF SNTH (GAUZE/BANDAGES/DRESSINGS) ×1
BNDG COHESIVE 4X5 TAN STRL (GAUZE/BANDAGES/DRESSINGS) ×3 IMPLANT
BNDG ESMARK 6X9 LF (GAUZE/BANDAGES/DRESSINGS) ×3
BNDG GAUZE ELAST 4 BULKY (GAUZE/BANDAGES/DRESSINGS) ×6 IMPLANT
CAP PROTECTIVE 5.0MM (DRAIN) ×8 IMPLANT
CLAMP LG COMBINATION (Clamp) ×4 IMPLANT
CLAMP LG MULTI PIN (Clamp) ×2 IMPLANT
COVER MAYO STAND STRL (DRAPES) IMPLANT
COVER SURGICAL LIGHT HANDLE (MISCELLANEOUS) ×4 IMPLANT
DRAPE EXTREMITY T 121X128X90 (DRAPE) ×6 IMPLANT
DRAPE INCISE IOBAN 66X45 STRL (DRAPES) IMPLANT
DRAPE OEC MINIVIEW 54X84 (DRAPES) IMPLANT
DRAPE U-SHAPE 47X51 STRL (DRAPES) ×3 IMPLANT
DRSG ADAPTIC 3X8 NADH LF (GAUZE/BANDAGES/DRESSINGS) ×3 IMPLANT
DRSG PAD ABDOMINAL 8X10 ST (GAUZE/BANDAGES/DRESSINGS) ×3 IMPLANT
DURAPREP 26ML APPLICATOR (WOUND CARE) ×3 IMPLANT
ELECT REM PT RETURN 9FT ADLT (ELECTROSURGICAL) ×3
ELECTRODE REM PT RTRN 9FT ADLT (ELECTROSURGICAL) ×1 IMPLANT
GAUZE SPONGE 4X4 12PLY STRL (GAUZE/BANDAGES/DRESSINGS) ×3 IMPLANT
GLOVE BIOGEL PI IND STRL 9 (GLOVE) ×1 IMPLANT
GLOVE BIOGEL PI INDICATOR 9 (GLOVE) ×2
GLOVE SURG ORTHO 9.0 STRL STRW (GLOVE) ×3 IMPLANT
GOWN STRL REUS W/ TWL LRG LVL3 (GOWN DISPOSABLE) ×1 IMPLANT
GOWN STRL REUS W/ TWL XL LVL3 (GOWN DISPOSABLE) ×3 IMPLANT
GOWN STRL REUS W/TWL LRG LVL3 (GOWN DISPOSABLE) ×3
GOWN STRL REUS W/TWL XL LVL3 (GOWN DISPOSABLE) ×9
KIT BASIN OR (CUSTOM PROCEDURE TRAY) ×3 IMPLANT
KIT ROOM TURNOVER OR (KITS) ×3 IMPLANT
KIT STIMULAN RAPID CURE  10CC (Orthopedic Implant) ×2 IMPLANT
KIT STIMULAN RAPID CURE 10CC (Orthopedic Implant) IMPLANT
MANIFOLD NEPTUNE II (INSTRUMENTS) ×3 IMPLANT
NS IRRIG 1000ML POUR BTL (IV SOLUTION) ×3 IMPLANT
PACK ORTHO EXTREMITY (CUSTOM PROCEDURE TRAY) ×3 IMPLANT
PAD ARMBOARD 7.5X6 YLW CONV (MISCELLANEOUS) ×6 IMPLANT
PAD CAST 4YDX4 CTTN HI CHSV (CAST SUPPLIES) ×1 IMPLANT
PADDING CAST COTTON 4X4 STRL (CAST SUPPLIES) ×3
PENCIL BUTTON HOLSTER BLD 10FT (ELECTRODE) ×3 IMPLANT
PIN SHANZ TRANSFIX 6.0X225 (PIN) ×2 IMPLANT
ROD CRBN FBR LRG EX-FX 11X250 (Rod) ×2 IMPLANT
ROD CRBN FBR LRG EX-FX 11X300 (Rod) ×2 IMPLANT
SPONGE LAP 18X18 X RAY DECT (DISPOSABLE) ×3 IMPLANT
STOCKINETTE 6  STRL (DRAPES) ×2
STOCKINETTE 6 STRL (DRAPES) ×1 IMPLANT
SUCTION FRAZIER TIP 10 FR DISP (SUCTIONS) ×3 IMPLANT
SUT ETHILON 2 0 PSLX (SUTURE) ×9 IMPLANT
SUT ETHILON 3 0 FSLX (SUTURE) ×3 IMPLANT
SUT VIC AB 2-0 CTB1 (SUTURE) ×3 IMPLANT
TOWEL OR 17X24 6PK STRL BLUE (TOWEL DISPOSABLE) ×3 IMPLANT
TOWEL OR 17X26 10 PK STRL BLUE (TOWEL DISPOSABLE) ×3 IMPLANT
TUBE CONNECTING 12'X1/4 (SUCTIONS) ×1
TUBE CONNECTING 12X1/4 (SUCTIONS) ×2 IMPLANT
WATER STERILE IRR 1000ML POUR (IV SOLUTION) ×3 IMPLANT
YANKAUER SUCT BULB TIP NO VENT (SUCTIONS) IMPLANT

## 2015-01-19 NOTE — H&P (Signed)
Monique [name] is an 39 y.o. female.   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 Patient underwent removal of deep retained hardware placement of antibiotic beads as well as irrigation and debridement. Patient presents at this time for repeat irrigation and debridement stabilization of the tibial talar joint with placement of external fixation.  Past Medical History  Diagnosis Date  . Bipolar disorder (HCC)   . 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 GERD (gastroesophageal reflux disease)   . Nerve damage of right foot   . Arthritis   . MRSA infection   . Migraine     "weekly" (12/17/2014)  . Chronic back pain     "middle and lower" (12/17/2014)  . Anxiety   . Depression   . Infection right ankle  . Asthma     PHM    Past Surgical History  Procedure Laterality Date  . Tubal ligation    . Hardware removal Right 09/02/2014    Procedure: Removal Right Lateral Ankle Hardware, Place Antibiotic Bead ;  Surgeon: Nadara MustardMarcus Devantae Babe V, MD;  Location: [facility] ORS;  Service: Orthopedics;  Laterality: Right;  . Ankle fracture surgery Right 82016  . Fracture surgery    . Dilation and curettage of uterus  X 2  . Incision and drainage abscess Right 12/19/2014    Procedure: INCISION AND DRAINAGE OF ABSCESS ON RIGHT SIDE OF CHIN;  Surgeon: Christia Readingwight Bates, MD;  [facility];  Service: ENT;  Laterality: Right;    Family History  Problem Relation Age of Onset  . Cancer Mother     right hip cancer    Social History:  reports that she has been smoking Cigarettes.  She has a 12.5 pack-year smoking history. She has never used smokeless tobacco. She reports that she drinks alcohol. She reports that she uses illicit drugs (Cocaine, Marijuana, and Benzodiazepines).  Allergies: No Known Allergies  No prescriptions prior to admission    No results found for this or any  previous visit (from the past 48 hour(s)). No results found.  Review of Systems  All other systems reviewed and are negative.   Last menstrual period 01/18/2015. Physical Exam   On examination patient has some mild drainage but no purulence. Her ankle joint is unstable. Radiographs shows unstable Charcot collapse of the ankle joint. 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  Plan: We will plan for repeat irrigation and debridement and fusion of the tibial talar joint with placement of external fixation. Risk and benefits were discussed patient states she understands and wishes to proceed at this time.  Monique [name] V 01/19/2015, 6:29 AM

## 2015-01-19 NOTE — Op Note (Signed)
01/19/2015  11:02 AM  PATIENT:  Monique [name]    PRE-OPERATIVE DIAGNOSIS:  Right Ankle Infection  POST-OPERATIVE DIAGNOSIS:  Same  PROCEDURE:  Right Tibiotalar Fusion, Place Antibiotic Beads, EXTERNAL FIXATION Right Ankle, C-arm fluoroscopy for alignment of the fusion and external fixator.  SURGEON:  Nadara MustardUDA,Kacper Cartlidge V, MD  PHYSICIAN ASSISTANT:None ANESTHESIA:   General  PREOPERATIVE INDICATIONS:  [name] is a  39 y.o. female with a diagnosis of Right Ankle Infection who failed conservative measures and elected for surgical management.    The risks benefits and alternatives were discussed with the patient preoperatively including but not limited to the risks of infection, bleeding, nerve injury, cardiopulmonary complications, the need for revision surgery, among others, and the patient was willing to proceed.  OPERATIVE IMPLANTS: External fixator delta frame. Vancomycin and gentamicin antibiotic beads with 1 g vancomycin and 240 mg gentamicin  OPERATIVE FINDINGS: Low-grade infection of the ankle.  OPERATIVE PROCEDURE: Patient was brought to the operating room and underwent a general anesthetic. After adequate levels anesthesia obtained patient's right lower extremity was prepped using DuraPrep draped into a sterile field. A timeout was called. A lateral incision was made through her previous incision. The distal aspect of the fibula was resected. Perpendicular cuts were made to the long axis of the tibia through the tibial talar joint to fuse the ankle at 90. The ankle was reduced all loose wound was removed the wound was irrigated with normal saline. The wound was then packed open with antibiotic beads with stimulant +1 g of vancomycin plus 240 mg gentamicin. A delta frame external fixator was placed this was compressed with the ankle at 90. This was tightened C-arm floss be verified alignment. The incision was then closed using 2-0 nylon. A sterile compressive dressing was  applied. Patient was extubated taken to the PACU in stable condition plan for overnight observation.

## 2015-01-19 NOTE — Anesthesia Preprocedure Evaluation (Signed)
Anesthesia Evaluation  Patient identified by MRN, date of birth, ID band Patient awake    Reviewed: Allergy & Precautions, NPO status , Patient's Chart, lab work & pertinent test results  History of Anesthesia Complications Negative for: history of anesthetic complications  Airway Mallampati: I  TM Distance: >3 FB Neck ROM: Full    Dental  (+) Teeth Intact   Pulmonary asthma , Current Smoker,    breath sounds clear to auscultation       Cardiovascular negative cardio ROS   Rhythm:Regular     Neuro/Psych  Headaches, PSYCHIATRIC DISORDERS Anxiety Depression Bipolar Disorder    GI/Hepatic Neg liver ROS, GERD  Controlled,  Endo/Other  negative endocrine ROS  Renal/GU negative Renal ROS     Musculoskeletal  (+) Arthritis ,   Abdominal   Peds  Hematology  (+) anemia ,   Anesthesia Other Findings   Reproductive/Obstetrics                             Anesthesia Physical Anesthesia Plan  ASA: II  Anesthesia Plan: General   Post-op Pain Management: GA combined w/ Regional for post-op pain   Induction: Intravenous  Airway Management Planned: LMA  Additional Equipment: None  Intra-op Plan:   Post-operative Plan: Extubation in OR  Informed Consent: I have reviewed the patients History and Physical, chart, labs and discussed the procedure including the risks, benefits and alternatives for the proposed anesthesia with the patient or authorized representative who has indicated his/her understanding and acceptance.   Dental advisory given  Plan Discussed with: CRNA and Surgeon  Anesthesia Plan Comments:         Anesthesia Quick Evaluation

## 2015-01-19 NOTE — Anesthesia Postprocedure Evaluation (Signed)
Anesthesia Post Note  Patient: Monique [name]  Procedure(s) Performed: Procedure(s) (LRB): Right Tibiotalar Fusion, Place Antibiotic Beads (Right) EXTERNAL FIXATION Right Ankle (Right)  Patient location during evaluation: PACU Anesthesia Type: General and Regional Level of consciousness: awake Pain management: pain level controlled Vital Signs Assessment: post-procedure vital signs reviewed and stable Respiratory status: spontaneous breathing and respiratory function stable Cardiovascular status: stable Postop Assessment: No signs of nausea or vomiting Anesthetic complications: no    Last Vitals:  Filed Vitals:   01/19/15 1230 01/19/15 1307  BP: 110/75 118/56  Pulse: 69 76  Temp: 36.6 C 36.2 C  Resp: 15 18    Last Pain:  Filed Vitals:   01/19/15 1420  PainSc: 6         RLE Motor Response: No movement due to regional block RLE Sensation: Numbness      Ziyon Cedotal

## 2015-01-19 NOTE — Evaluation (Signed)
Physical Therapy Evaluation Patient Details Name: Monique [name] MRN: [id number] DOB: 1975-03-05 Today's Date: 01/19/2015   History of Present Illness  39 y.o. female admitted to [facility] on 01/19/15 s/p post collapse of an ankle fracture with infection (08/2014). Patient underwent removal of deep retained hardware placement of antibiotic beads as well as irrigation and debridement (11/2014). Patient presents this admission for repeat irrigation and debridement stabilization of the tibial talar joint with placement of external fixation.  Pt with significant PMHx of bipolar disorder, osteomyelitis of the ankle, nerve damage right foot, MRSA infection, chronic back pain, migraine, and anxiety/depression.  Clinical Impression  Pt is mobilizing well with min guard assist to transfer OOB to 3-in-1 and to recliner.  She doesn't have full sensation yet in right lower leg post-op, so this may change a bit by tomorrow.  She will need to practice steps to ensure safe home entry strategies before d/c home if she goes tomorrow.   PT to follow acutely for deficits listed below.     Follow Up Recommendations No PT follow up    Equipment Recommendations  Rolling walker with 5" wheels;Wheelchair (measurements PT);Wheelchair cushion (measurements PT);3in1 (PT);Other (comment) (WC with elevating leg rests)    Recommendations for Other Services   NA    Precautions / Restrictions Precautions Precautions: Fall Precaution Comments: due to NWB status Restrictions Weight Bearing Restrictions: Yes RLE Weight Bearing: Non weight bearing      Mobility  Bed Mobility Overal bed mobility: Needs Assistance Bed Mobility: Supine to Sit     Supine to sit: Supervision;HOB elevated     General bed mobility comments: supervision for safety, pt able to progress leg over EOB, HOB elevated and using railing.   Transfers Overall transfer level: Needs assistance Equipment used: None;Rolling walker (2  wheeled) Transfers: Sit to/from [employer] Sit to Stand: Min guard Stand pivot transfers: Min guard       General transfer comment: Min guard assist for safety and balance both with and without RW for support.  Verbal cues for safe hand placement and NWB status at right leg.  Pt able to hold leg completely off of floor during transfers.                      Balance Overall balance assessment: Needs assistance Sitting-balance support: Feet supported;No upper extremity supported Sitting balance-Leahy Scale: Good     Standing balance support: Bilateral upper extremity supported Standing balance-Leahy Scale: Fair                               Pertinent Vitals/Pain Pain Assessment: 0-10 Pain Score: 3  Pain Descriptors / Indicators: Aching;Pins and needles Pain Intervention(s): Limited activity within patient's tolerance;Monitored during session;Repositioned    Home Living Family/patient expects to be discharged to:: Private residence Living Arrangements: Spouse/significant other Available Help at Discharge: Family;Available PRN/intermittently (significant other works 12 hour shifts) Type of Home: House Home Access: Stairs to enter Entrance Stairs-Rails: Right;Left;Can reach both Secretary/administrator of Steps: 3 Home Layout: One level Home Equipment: None (pt no longer has RW)      Prior Function Level of Independence: Independent               Hand Dominance   Dominant Hand: Right    Extremity/Trunk Assessment   Upper Extremity Assessment: Overall WFL for tasks assessed           Lower Extremity  Assessment: RLE deficits/detail RLE Deficits / Details: right loer leg immobilized by x-fix and pt is unable to wiggle or even feel toes yet.  Pt is able to lift leg against gravity, so hi p and knee at least 3/5 per gross functional assessment.     Cervical / Trunk Assessment: Other exceptions  Communication   Communication:  No difficulties  Cognition Arousal/Alertness: Awake/alert Behavior During Therapy: WFL for tasks assessed/performed Overall Cognitive Status: Within Functional Limits for tasks assessed                               Assessment/Plan    PT Assessment Patient needs continued PT services  PT Diagnosis Difficulty walking;Abnormality of gait;Generalized weakness;Acute pain   PT Problem List Decreased strength;Decreased range of motion;Decreased activity tolerance;Decreased balance;Decreased mobility;Decreased knowledge of use of DME;Pain  PT Treatment Interventions DME instruction;Gait training;Stair training;Functional mobility training;Therapeutic activities;Therapeutic exercise;Balance training;Neuromuscular re-education;Patient/family education;Wheelchair mobility training;Modalities   PT Goals (Current goals can be found in the Care Plan section) Acute Rehab PT Goals Patient Stated Goal: to not have too much pain after the nerve block wears off.  PT Goal Formulation: With patient Time For Goal Achievement: 01/26/15 Potential to Achieve Goals: Good    Frequency Min 5X/week   Barriers to discharge Decreased caregiver support home alone much of the day       End of Session   Activity Tolerance: Patient limited by pain Patient left: in chair;with call bell/phone within reach      Functional Assessment Tool Used: assist level Functional Limitation: Mobility: Walking and moving around Mobility: Walking and Moving Around Current Status ([id number](G8978): At least 20 percent but less than 40 percent impaired, limited or restricted Mobility: Walking and Moving Around Goal Status [phone]): At least 1 percent but less than 20 percent impaired, limited or restricted    Time: 1653-1720 PT Time Calculation (min) (ACUTE ONLY): 27 min   Charges:   PT Evaluation $Initial PT Evaluation Tier I: 1 Procedure PT Treatments $Therapeutic Activity: 8-22 mins   PT G Codes:   PT G-Codes **NOT  FOR INPATIENT CLASS** Functional Assessment Tool Used: assist level Functional Limitation: Mobility: Walking and moving around Mobility: Walking and Moving Around Current Status ([id number](G8978): At least 20 percent but less than 40 percent impaired, limited or restricted Mobility: Walking and Moving Around Goal Status [phone](G8979): At least 1 percent but less than 20 percent impaired, limited or restricted    Stancil Deisher B. Georgette Helmer, PT, DPT [phone]   01/19/2015, 5:50 PM

## 2015-01-19 NOTE — Anesthesia Procedure Notes (Addendum)
Anesthesia Regional Block:  Popliteal block  Pre-Anesthetic Checklist: ,, timeout performed, Correct Patient, Correct Site, Correct Laterality, Correct Procedure, Correct Position, site marked, Risks and benefits discussed,  Surgical consent,  Pre-op evaluation,  At surgeon's request and post-op pain management  Laterality: Lower and Right  Prep: chloraprep       Needles:  Injection technique: Single-shot  Needle Type: Echogenic Stimulator Needle          Additional Needles:  Procedures: ultrasound guided (picture in chart) and nerve stimulator Popliteal block  Nerve Stimulator or Paresthesia:  Response: plantar, 0.5 mA,   Additional Responses:   Narrative:  Injection made incrementally with aspirations every 5 mL.  Performed by: Personally  Anesthesiologist: Wenonah Milo  Additional Notes: H+P and labs reviewed, risks and benefits discussed with patient, procedure tolerated well without complications   Anesthesia Regional Block:  Adductor canal block  Pre-Anesthetic Checklist: ,, timeout performed, Correct Patient, Correct Site, Correct Laterality, Correct Procedure, Correct Position, site marked, Risks and benefits discussed,  Surgical consent,  Pre-op evaluation,  At surgeon's request and post-op pain management  Laterality: Lower and Right  Prep: chloraprep       Needles:  Injection technique: Single-shot  Needle Type: Echogenic Stimulator Needle          Additional Needles:  Procedures: ultrasound guided (picture in chart) Adductor canal block Narrative:  Injection made incrementally with aspirations every 5 mL.  Performed by: Personally  Anesthesiologist: Avarie Tavano  Additional Notes: H+P and labs reviewed, risks and benefits discussed with patient, procedure tolerated well without complications   Procedure Name: LMA Insertion Date/Time: 01/19/2015 10:08 AM Performed by: Margaree MackintoshYACOUB, ALISHA B Pre-anesthesia Checklist: Patient  identified, Emergency Drugs available, Suction available, Patient being monitored and Timeout performed Patient Re-evaluated:Patient Re-evaluated prior to inductionOxygen Delivery Method: Circle system utilized Preoxygenation: Pre-oxygenation with 100% oxygen Intubation Type: IV induction LMA: LMA inserted LMA Size: 4.0 Number of attempts: 1 Placement Confirmation: positive ETCO2 and breath sounds checked- equal and bilateral Tube secured with: Tape Dental Injury: Teeth and Oropharynx as per pre-operative assessment

## 2015-01-19 NOTE — Transfer of Care (Signed)
Immediate Anesthesia Transfer of Care Note  Patient: Monique [name]  Procedure(s) Performed: Procedure(s): Right Tibiotalar Fusion, Place Antibiotic Beads (Right) EXTERNAL FIXATION Right Ankle (Right)  Patient Location: PACU  Anesthesia Type:GA combined with regional for post-op pain  Level of Consciousness: awake, alert  and oriented  Airway & Oxygen Therapy: Patient Spontanous Breathing and Patient connected to nasal cannula oxygen  Post-op Assessment: Report given to RN and Post -op Vital signs reviewed and stable  Post vital signs: Reviewed and stable  Last Vitals:  Filed Vitals:   01/19/15 0739  BP: 108/70  Pulse: 68  Temp: 36.7 C  Resp: 18    Complications: No apparent anesthesia complications

## 2015-01-20 DIAGNOSIS — A5216 Charcot's arthropathy (tabetic): Secondary | ICD-10-CM | POA: Diagnosis not present

## 2015-01-20 MED ORDER — METHOCARBAMOL 500 MG PO TABS: 500.0000 mg | ORAL_TABLET | Freq: Four times a day (QID) | ORAL | Status: DC | PRN

## 2015-01-20 MED ORDER — OXYCODONE HCL 5 MG PO TABS: 5.0000 mg | ORAL_TABLET | ORAL | Status: DC | PRN

## 2015-01-20 NOTE — Care Management Note (Signed)
Case Management Note  Patient Details  Name: Monique [name] MRN: [id number] Date of Birth: 04/28/1975  Subjective/Objective:  39 yr old female admitted with right ankle infection, s/p right tialtalar fusion, with antibiotic bead placement and application of external fixator.                  Action/Plan: Case manager arranged for DME needs. No home health needs identified.    Expected Discharge Date:    01/20/15              Expected Discharge Plan:  Home/Self Care  In-House Referral:     Discharge planning Services  CM Consult  Post Acute Care Choice:  Durable Medical Equipment Choice offered to:     DME Arranged:  3-N-1, Walker rolling, Wheelchair manual DME Agency:  [facility]  HH Arranged:  NA HH Agency:  NA  Status of Service:  Completed, signed off  Medicare Important Message Given:    Date Medicare IM Given:    Medicare IM give by:    Date Additional Medicare IM Given:    Additional Medicare Important Message give by:     If discussed at Long Length of Stay Meetings, dates discussed:    Additional Comments:  Durenda GuthrieBrady, Jatin Naumann Naomi, RN 01/20/2015, 10:50 AM

## 2015-01-20 NOTE — Progress Notes (Signed)
Physical Therapy Treatment Patient Details Name: Monique [name] MRN: [id number] DOB: [birth year] Today's Date: 01/20/2015    History of Present Illness 39 y.o. female admitted to [facility] on 01/19/15 s/p post collapse of an ankle fracture with infection (08/2014). Patient underwent removal of deep retained hardware placement of antibiotic beads as well as irrigation and debridement (11/2014). Patient presents this admission for repeat irrigation and debridement stabilization of the tibial talar joint with placement of external fixation.  Pt with significant PMHx of bipolar disorder, osteomyelitis of the ankle, nerve damage right foot, MRSA infection, chronic back pain, migraine, and anxiety/depression.    PT Comments    Patient eager for d/c due to ride waiting.  Able to ambulate with walker to stairs and negotiate 2 steps with rail and crutch.  Feel patient will be safe for d/c home without initial follow up PT.  Follow Up Recommendations  No PT follow up     Equipment Recommendations  Rolling walker with 5" wheels;Wheelchair (measurements PT);Wheelchair cushion (measurements PT);3in1 (PT);Other (comment);Crutches (ELR's on w/c)    Recommendations for Other Services       Precautions / Restrictions Precautions Precautions: Fall Precaution Comments: due to NWB status Restrictions Weight Bearing Restrictions: Yes RLE Weight Bearing: Non weight bearing    Mobility  Bed Mobility               General bed mobility comments: sitting on edge of bed  Transfers     Transfers: Squat Pivot Transfers Sit to Stand: Min guard   Squat pivot transfers: Supervision     General transfer comment: S bed to recliner, minguard for balance/safety from recliner  Ambulation/Gait Ambulation/Gait assistance: Min guard Ambulation Distance (Feet): 8 Feet Assistive device: Rolling walker (2 wheeled) Gait Pattern/deviations: Step-to pattern     General Gait Details: maintains NWB R,  minguard for safety   Stairs Stairs: Yes Stairs assistance: Min assist Stair Management: One rail Left;With crutches Number of Stairs: 2 (x 2) General stair comments: demonstrated multiple options, pt chose to attempt with 2 rails, but unable so gave crutch on one side and able to hop up and down safely with min A.  Patient anxious for d/c due to ride waiting so gave crutch from gym without tag to take home  Wheelchair Mobility    Modified Rankin (Stroke Patients Only)       Balance Overall balance assessment: Needs assistance   Sitting balance-Leahy Scale: Good     Standing balance support: Bilateral upper extremity supported Standing balance-Leahy Scale: Poor Standing balance comment: UE support needed for balance due to NWB                    Cognition Arousal/Alertness: Awake/alert Behavior During Therapy: Anxious Overall Cognitive Status: Within Functional Limits for tasks assessed                      Exercises      General Comments        Pertinent Vitals/Pain Pain Score: 3  Pain Descriptors / Indicators: Pins and needles Pain Intervention(s): Monitored during session;Repositioned    Home Living                      Prior Function            PT Goals (current goals can now be found in the care plan section) Progress towards PT goals: Progressing toward goals    Frequency  Min 5X/week  PT Plan Current plan remains appropriate    Co-evaluation             End of Session Equipment Utilized During Treatment: Gait belt Activity Tolerance: Patient tolerated treatment well Patient left: in chair;with call bell/phone within reach;with family/visitor present     Time: [id number] PT Time Calculation (min) (ACUTE ONLY): 20 min  Charges:  $Gait Training: 8-22 mins                    G Codes:      Olusegun Gerstenberger,CYNDI 01/20/2015, 11:58 AM Sheran Lawlessyndi Melvina Pangelinan, PT [phone] 01/20/2015

## 2015-01-20 NOTE — Progress Notes (Signed)
Patient stable pain controlled Dressing looks reasonable will change prior to discharge Has walker and wheelchair Percocet Xanax Robaxin prescribed until she renegotiates with  Dr. Lajoyce Cornersuda at first postop visit Plan discharge today

## 2015-01-24 ENCOUNTER — Encounter (HOSPITAL_COMMUNITY): Payer: Self-pay | Admitting: Orthopedic Surgery

## 2015-01-26 NOTE — Discharge Summary (Signed)
Physician Discharge Summary  Patient ID: Monique [name] MRN: [id number] DOB/AGE: 39/10/1975 39 y.o.  Admit date: 01/19/2015 Discharge date: 01/26/2015  Admission Diagnoses: Charcot collapse with infection right ankle  Discharge Diagnoses: External fixation with ankle fusion Active Problems:   S/P ankle fusion   Discharged Condition: stable  Hospital Course: Patient's hospital course was essentially unremarkable. She underwent right ankle fusion lace minute antibiotic beads placement of an external fixator. Postoperatively patient progressed well and was discharged to home in stable condition.  Consults: None  Significant Diagnostic Studies: labs: Routine labs  Treatments: surgery: See operative note  Discharge Exam: Blood pressure 121/70, pulse 66, temperature 98.1 F (36.7 C), temperature source Oral, resp. rate 18, height 5\' 4"  (1.626 m), weight 81.647 kg (180 lb), last menstrual period 01/18/2015, SpO2 97 %. Incision/Wound: dressing clean and dry  Disposition: 01-Home or Self Care  Discharge Instructions    Call MD / Call 911    Complete by:  As directed   If you experience chest pain or shortness of breath, CALL 911 and be transported to the hospital emergency room.  If you develope a fever above 101 F, pus (white drainage) or increased drainage or redness at the wound, or calf pain, call your surgeon's office.     Constipation Prevention    Complete by:  As directed   Drink plenty of fluids.  Prune juice may be helpful.  You may use a stool softener, such as Colace (over the counter) 100 mg twice a day.  Use MiraLax (over the counter) for constipation as needed.     Diet - low sodium heart healthy    Complete by:  As directed      Discharge instructions    Complete by:  As directed      Increase activity slowly as tolerated    Complete by:  As directed             Medication List    STOP taking these medications        acetaminophen 325 MG tablet   Commonly known as:  TYLENOL     pregabalin 50 MG capsule  Commonly known as:  LYRICA      TAKE these medications        ALKA-SELTZER PO  Take 1 tablet by mouth as needed (for cold).     ALPRAZolam 0.5 MG tablet  Commonly known as:  XANAX  Take 1 tablet (0.5 mg total) by mouth 2 (two) times daily. Take 0.5mg  by mouth in the afternoon and take 0.5mg  by mouth at bedtime     amitriptyline 10 MG tablet  Commonly known as:  ELAVIL  Take 1 tablet (10 mg total) by mouth at bedtime.     buPROPion 100 MG 12 hr tablet  Commonly known as:  WELLBUTRIN SR  Take 1 tablet (100 mg total) by mouth 2 (two) times daily.     ibuprofen 800 MG tablet  Commonly known as:  ADVIL,MOTRIN  Take 1 tablet (800 mg total) by mouth 3 (three) times daily.     methocarbamol 500 MG tablet  Commonly known as:  ROBAXIN  Take 1 tablet (500 mg total) by mouth every 6 (six) hours as needed for muscle spasms.     nicotine 21 mg/24hr patch  Commonly known as:  NICODERM CQ - dosed in mg/24 hours  Place 1 patch (21 mg total) onto the skin daily.     oxyCODONE 5 MG immediate release tablet  Commonly known  as:  Oxy IR/ROXICODONE  Take 1-2 tablets (5-10 mg total) by mouth every 3 (three) hours as needed for breakthrough pain.           Follow-up Information    Follow up with DUDA,MARCUS V, MD In 1 week.   Specialty:  Orthopedic Surgery   Contact information:   [address] [place] [address] [phone]       Signed: [name] 01/26/2015, 5:26 PM

## 2015-02-06 ENCOUNTER — Emergency Department (HOSPITAL_COMMUNITY)
Admission: EM | Admit: 2015-02-06 | Discharge: 2015-02-06 | Disposition: A | Discharge status: Home / Self Care | Payer: Medicaid Other | Attending: Physician Assistant | Admitting: Physician Assistant

## 2015-02-06 ENCOUNTER — Emergency Department (HOSPITAL_COMMUNITY): Payer: Medicaid Other

## 2015-02-06 ENCOUNTER — Encounter (HOSPITAL_COMMUNITY): Payer: Self-pay | Admitting: Emergency Medicine

## 2015-02-06 DIAGNOSIS — F319 Bipolar disorder, unspecified: Secondary | ICD-10-CM | POA: Diagnosis not present

## 2015-02-06 DIAGNOSIS — R5383 Other fatigue: Secondary | ICD-10-CM | POA: Insufficient documentation

## 2015-02-06 DIAGNOSIS — Z7982 Long term (current) use of aspirin: Secondary | ICD-10-CM | POA: Diagnosis not present

## 2015-02-06 DIAGNOSIS — F419 Anxiety disorder, unspecified: Secondary | ICD-10-CM | POA: Insufficient documentation

## 2015-02-06 DIAGNOSIS — Z79899 Other long term (current) drug therapy: Secondary | ICD-10-CM | POA: Insufficient documentation

## 2015-02-06 DIAGNOSIS — Z87440 Personal history of urinary (tract) infections: Secondary | ICD-10-CM | POA: Insufficient documentation

## 2015-02-06 DIAGNOSIS — G43909 Migraine, unspecified, not intractable, without status migrainosus: Secondary | ICD-10-CM | POA: Insufficient documentation

## 2015-02-06 DIAGNOSIS — J45909 Unspecified asthma, uncomplicated: Secondary | ICD-10-CM | POA: Insufficient documentation

## 2015-02-06 DIAGNOSIS — Y9289 Other specified places as the place of occurrence of the external cause: Secondary | ICD-10-CM | POA: Diagnosis not present

## 2015-02-06 DIAGNOSIS — S99921A Unspecified injury of right foot, initial encounter: Secondary | ICD-10-CM | POA: Insufficient documentation

## 2015-02-06 DIAGNOSIS — G8929 Other chronic pain: Secondary | ICD-10-CM | POA: Diagnosis not present

## 2015-02-06 DIAGNOSIS — W010XXA Fall on same level from slipping, tripping and stumbling without subsequent striking against object, initial encounter: Secondary | ICD-10-CM | POA: Insufficient documentation

## 2015-02-06 DIAGNOSIS — Y9389 Activity, other specified: Secondary | ICD-10-CM | POA: Diagnosis not present

## 2015-02-06 DIAGNOSIS — S99911A Unspecified injury of right ankle, initial encounter: Secondary | ICD-10-CM | POA: Insufficient documentation

## 2015-02-06 DIAGNOSIS — Y998 Other external cause status: Secondary | ICD-10-CM | POA: Insufficient documentation

## 2015-02-06 DIAGNOSIS — Z791 Long term (current) use of non-steroidal anti-inflammatories (NSAID): Secondary | ICD-10-CM | POA: Insufficient documentation

## 2015-02-06 DIAGNOSIS — M1990 Unspecified osteoarthritis, unspecified site: Secondary | ICD-10-CM | POA: Insufficient documentation

## 2015-02-06 DIAGNOSIS — R112 Nausea with vomiting, unspecified: Secondary | ICD-10-CM | POA: Insufficient documentation

## 2015-02-06 DIAGNOSIS — Z8614 Personal history of Methicillin resistant Staphylococcus aureus infection: Secondary | ICD-10-CM | POA: Diagnosis not present

## 2015-02-06 DIAGNOSIS — Z8619 Personal history of other infectious and parasitic diseases: Secondary | ICD-10-CM | POA: Insufficient documentation

## 2015-02-06 DIAGNOSIS — M25571 Pain in right ankle and joints of right foot: Secondary | ICD-10-CM

## 2015-02-06 DIAGNOSIS — F17210 Nicotine dependence, cigarettes, uncomplicated: Secondary | ICD-10-CM | POA: Insufficient documentation

## 2015-02-06 LAB — BASIC METABOLIC PANEL
ANION GAP: 10 (ref 5–15)
BUN: 14 mg/dL (ref 6–20)
CHLORIDE: 102 mmol/L (ref 101–111)
CO2: 20 mmol/L — ABNORMAL LOW (ref 22–32)
Calcium: 9.7 mg/dL (ref 8.9–10.3)
Creatinine, Ser: 0.66 mg/dL (ref 0.44–1.00)
GFR calc Af Amer: >60 mL/min (ref >60)
GLUCOSE: 119 mg/dL — AB (ref 65–99)
POTASSIUM: 4 mmol/L (ref 3.5–5.1)
SODIUM: 132 mmol/L — AB (ref 135–145)

## 2015-02-06 LAB — CBC
HCT: 37.7 % (ref 36.0–46.0)
HEMOGLOBIN: 11.5 g/dL — AB (ref 12.0–15.0)
MCH: 24 pg — AB (ref 26.0–34.0)
MCHC: 30.5 g/dL (ref 30.0–36.0)
MCV: 78.5 fL (ref 78.0–100.0)
PLATELETS: 345 10*3/uL (ref 150–400)
RBC: 4.8 MIL/uL (ref 3.87–5.11)
RDW: 17 % — ABNORMAL HIGH (ref 11.5–15.5)
WBC: 9.7 10*3/uL (ref 4.0–10.5)

## 2015-02-06 LAB — I-STAT CG4 LACTIC ACID, ED: LACTIC ACID, VENOUS: 1.76 mmol/L (ref 0.5–2.0)

## 2015-02-06 MED ORDER — OXYCODONE-ACETAMINOPHEN 5-325 MG PO TABS: 1.0000 | ORAL_TABLET | Freq: Four times a day (QID) | ORAL | Status: DC | PRN

## 2015-02-06 MED ORDER — ONDANSETRON HCL 4 MG/2ML IJ SOLN
4.0000 mg | Freq: Once | INTRAMUSCULAR | Status: AC
  Administered 2015-02-06: 4 mg via INTRAVENOUS
  Filled 2015-02-06: qty 2

## 2015-02-06 MED ORDER — FENTANYL CITRATE (PF) 100 MCG/2ML IJ SOLN
50.0000 ug | Freq: Once | INTRAMUSCULAR | Status: AC
Start: 2015-02-06 — End: 2015-02-06
  Administered 2015-02-06: 50 ug via INTRAVENOUS
  Filled 2015-02-06: qty 2

## 2015-02-06 MED ORDER — FENTANYL CITRATE (PF) 100 MCG/2ML IJ SOLN
50.0000 ug | Freq: Once | INTRAMUSCULAR | Status: AC
  Administered 2015-02-06: 50 ug via INTRAVENOUS
  Filled 2015-02-06: qty 2

## 2015-02-06 MED ORDER — ONDANSETRON HCL 4 MG PO TABS: 4.0000 mg | ORAL_TABLET | Freq: Three times a day (TID) | ORAL | Status: DC | PRN

## 2015-02-06 MED ORDER — ONDANSETRON 4 MG PO TBDP
4.0000 mg | ORAL_TABLET | Freq: Once | ORAL | Status: AC
  Administered 2015-02-06: 4 mg via ORAL
  Filled 2015-02-06: qty 1

## 2015-02-06 MED ORDER — OXYCODONE-ACETAMINOPHEN 5-325 MG PO TABS
2.0000 | ORAL_TABLET | ORAL | Status: AC
Start: 2015-02-06 — End: 2015-02-06
  Administered 2015-02-06: 2 via ORAL
  Filled 2015-02-06: qty 2

## 2015-02-06 NOTE — Discharge Instructions (Signed)
Please return if you have fever or redness to your ankle. Otherwise follow up with Dr. Lajoyce Cornersduda on Monday.  Ankle Pain Ankle pain is a common symptom. The bones, cartilage, tendons, and muscles of the ankle joint perform a lot of work each day. The ankle joint holds your body weight and allows you to move around. Ankle pain can occur on either side or back of 1 or both ankles. Ankle pain may be sharp and burning or dull and aching. There may be tenderness, stiffness, redness, or warmth around the ankle. The pain occurs more often when a person walks or puts pressure on the ankle. CAUSES  There are many reasons ankle pain can develop. It is important to work with your caregiver to identify the cause since many conditions can impact the bones, cartilage, muscles, and tendons. Causes for ankle pain include:  Injury, including a break (fracture), sprain, or strain often due to a fall, sports, or a high-impact activity.  Swelling (inflammation) of a tendon (tendonitis).  Achilles tendon rupture.  Ankle instability after repeated sprains and strains.  Poor foot alignment.  Pressure on a nerve (tarsal tunnel syndrome).  Arthritis in the ankle or the lining of the ankle.  Crystal formation in the ankle (gout or pseudogout). DIAGNOSIS  A diagnosis is based on your medical history, your symptoms, results of your physical exam, and results of diagnostic tests. Diagnostic tests may include X-ray exams or a computerized magnetic scan (magnetic resonance imaging, MRI). TREATMENT  Treatment will depend on the cause of your ankle pain and may include:  Keeping pressure off the ankle and limiting activities.  Using crutches or other walking support (a cane or brace).  Using rest, ice, compression, and elevation.  Participating in physical therapy or home exercises.  Wearing shoe inserts or special shoes.  Losing weight.  Taking medications to reduce pain or swelling or receiving an  injection.  Undergoing surgery. HOME CARE INSTRUCTIONS   Only take over-the-counter or prescription medicines for pain, discomfort, or fever as directed by your caregiver.  Put ice on the injured area.  Put ice in a plastic bag.  Place a towel between your skin and the bag.  Leave the ice on for 15-20 minutes at a time, 03-04 times a day.  Keep your leg raised (elevated) when possible to lessen swelling.  Avoid activities that cause ankle pain.  Follow specific exercises as directed by your caregiver.  Record how often you have ankle pain, the location of the pain, and what it feels like. This information may be helpful to you and your caregiver.  Ask your caregiver about returning to work or sports and whether you should drive.  Follow up with your caregiver for further examination, therapy, or testing as directed. SEEK MEDICAL CARE IF:   Pain or swelling continues or worsens beyond 1 week.  You have an oral temperature above 102 F (38.9 C).  You are feeling unwell or have chills.  You are having an increasingly difficult time with walking.  You have loss of sensation or other new symptoms.  You have questions or concerns. MAKE SURE YOU:   Understand these instructions.  Will watch your condition.  Will get help right away if you are not doing well or get worse.   This information is not intended to replace advice given to you by your health care provider. Make sure you discuss any questions you have with your health care provider.   Document Released: 08/02/2009 Document Revised:  05/07/2011 Document Reviewed: 09/14/2014 Elsevier Interactive Patient Education [employer]2016 Else[employer] Inc.

## 2015-02-06 NOTE — ED Notes (Signed)
Pt. Reports mild nausea. MD made aware, verbal order for ODT zofran 4mg  given.

## 2015-02-06 NOTE — ED Notes (Addendum)
Pt c/o tried to go to bathroom yesterday and fell causing increased pain and re injury to left ankle. Pt had surgery to left ankle day before Thanksgiving. Pt tried prescribed pain meds without relief. Pt has external device applied to left leg/ankle

## 2015-02-06 NOTE — ED Notes (Signed)
At xray

## 2015-02-06 NOTE — ED Provider Notes (Signed)
CSN: [id number]     Arrival date & time 02/06/15  1309 History   First MD Initiated Contact with Patient 02/06/15 1348     Chief Complaint  Patient presents with  . Fall  . Ankle Injury  . Post-op Problem     (Consider location/radiation/quality/duration/timing/severity/associated sxs/prior Treatment) HPI   Patient is a 39 year old female with history of bipolar disorder, osteomyelitis of the ankle, and chronic nerve damage, chronic pain anxiety depression presenting today with increasing right foot pain. Patient had surgery done on 11/23 by Dr. [name]. She has external fixator in place. She reports that she has had to move houses and has had increasing pain. She states that yesterday she tripped when she was getting up out of bed and feels that she knocked the external fixator. She had some mild vomiting and nausea today. No fevers.  Past Medical History  Diagnosis Date  . Bipolar disorder (HCC)   . 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 GERD (gastroesophageal reflux disease)   . Nerve damage of right foot   . Arthritis   . MRSA infection   . Migraine     "weekly" (12/17/2014)  . Chronic back pain     "middle and lower" (12/17/2014)  . Anxiety   . Depression   . Infection right ankle  . Asthma     PHM   Past Surgical History  Procedure Laterality Date  . Tubal ligation    . Hardware removal Right 09/02/2014    Procedure: Removal Right Lateral Ankle Hardware, Place Antibiotic Bead ;  Surgeon: [name], MD;  Location: [facility] ORS;  Service: Orthopedics;  Laterality: Right;  . Ankle fracture surgery Right 82016  . Fracture surgery    . Dilation and curettage of uterus  X 2  . Incision and drainage abscess Right 12/19/2014    Procedure: INCISION AND DRAINAGE OF ABSCESS ON RIGHT SIDE OF CHIN;  Surgeon: [name], MD;  Location: [facility] OR;  Service: ENT;  Laterality: Right;  . Ankle fusion Right 01/19/2015    Procedure: Right Tibiotalar Fusion, Place Antibiotic Beads;   Surgeon: [name], MD;  Location: [facility] OR;  Service: Orthopedics;  Laterality: Right;  . External fixation leg Right 01/19/2015    Procedure: EXTERNAL FIXATION Right Ankle;  Surgeon: [name], MD;  Location: [facility] OR;  Service: Orthopedics;  Laterality: Right;   Family History  Problem Relation Age of Onset  . Cancer Mother     right hip cancer    Social History  Substance Use Topics  . Smoking status: Current Every Day Smoker -- 0.50 packs/day for 25 years    Types: Cigarettes  . Smokeless tobacco: Never Used  . Alcohol Use: Yes     Comment:  " 7 months ago " 01/18/15   OB History    No data available     Review of Systems  Constitutional: Positive for fatigue. Negative for fever and activity change.  Respiratory: Negative for shortness of breath.   Cardiovascular: Negative for chest pain.  Gastrointestinal: Positive for nausea and vomiting. Negative for abdominal pain.  Genitourinary: Negative for dysuria.  Psychiatric/Behavioral: Negative for agitation.      Allergies  Review of patient's allergies indicates no known allergies.  Home Medications   Prior to Admission medications   Medication Sig Start Date End Date Taking? Authorizing Provider  ALPRAZolam Prudy Feeler) 0.5 MG tablet Take 1 tablet (0.5 mg total) by mouth 2 (two) times daily. Take  0.5mg  by mouth in the afternoon and take 0.5mg  by mouth at bedtime Patient taking differently: Take 0.5 mg by mouth 3 (three) times daily.  12/22/14   Catarina Hartshorn, MD  amitriptyline (ELAVIL) 10 MG tablet Take 1 tablet (10 mg total) by mouth at bedtime. 12/22/14   Catarina Hartshorn, MD  Aspirin Effervescent (ALKA-SELTZER PO) Take 1 tablet by mouth as needed (for cold).    Historical Provider, MD  buPROPion (WELLBUTRIN SR) 100 MG 12 hr tablet Take 1 tablet (100 mg total) by mouth 2 (two) times daily. 12/22/14   Catarina Hartshorn, MD  ibuprofen (ADVIL,MOTRIN) 800 MG tablet Take 1 tablet (800 mg total) by mouth 3 (three) times daily. 12/31/14   Cheri Fowler, PA-C  methocarbamol (ROBAXIN) 500 MG tablet Take 1 tablet (500 mg total) by mouth every 6 (six) hours as needed for muscle spasms. 01/20/15   Cammy Copa, MD  nicotine (NICODERM CQ - DOSED IN MG/24 HOURS) 21 mg/24hr patch Place 1 patch (21 mg total) onto the skin daily. Patient not taking: Reported on 12/16/2014 10/04/14   Catarina Hartshorn, MD  oxyCODONE (OXY IR/ROXICODONE) 5 MG immediate release tablet Take 1-2 tablets (5-10 mg total) by mouth every 3 (three) hours as needed for breakthrough pain. 01/20/15   Cammy Copa, MD   BP 126/72 mmHg  Pulse 89  Temp(Src) 98.2 F (36.8 C) (Oral)  Resp 18  Ht  (1.6 m)  Wt 180 lb (81.647 kg)  BMI 31.89 kg/m2  SpO2 99%  LMP 01/19/2015 Physical Exam  Constitutional: She is oriented to person, place, and time. She appears well-developed and well-nourished.  HENT:  Head: Normocephalic and atraumatic.  Eyes: Conjunctivae are normal. Right eye exhibits no discharge.  Neck: Neck supple.  Cardiovascular: Normal rate, regular rhythm and normal heart sounds.   No murmur heard. Pulmonary/Chest: Effort normal and breath sounds normal. She has no wheezes. She has no rales.  Abdominal: Soft. She exhibits no distension. There is no tenderness.  Musculoskeletal: Normal range of motion. She exhibits no edema.  Right lower exam he has external fixator in place. There is swelling. Mildly oozing of clear serosanguineous fluid from wound. No erythema. Mild warmth.  Neurological: She is oriented to person, place, and time. No cranial nerve deficit.  Skin: Skin is warm and dry. No rash noted. She is not diaphoretic.  Psychiatric: She has a normal mood and affect. Her behavior is normal.  Nursing note and vitals reviewed.   ED Course  Procedures (including critical care time) Labs Review Labs Reviewed  CBC - Abnormal; Notable for the following:    Hemoglobin 11.5 (*)    MCH 24.0 (*)    RDW 17.0 (*)    All other components within normal limits   BASIC METABOLIC PANEL - Abnormal; Notable for the following:    Sodium 132 (*)    CO2 20 (*)    Glucose, Bld 119 (*)    All other components within normal limits  I-STAT CG4 LACTIC ACID, ED    Imaging Review Dg Ankle Complete Right  02/06/2015  CLINICAL DATA:  Right ankle pain after falling. EXAM: RIGHT ANKLE - COMPLETE 3+ VIEW COMPARISON:  December 31, 2014. FINDINGS: Status post internal fixation of of right lower leg, with proximal screws seen in the mid tibial shaft, and distal screw passing through calcaneus. Severely comminuted fracture of the distal right tibia is noted. There has been surgical resection of the distal fibula. Old healed fracture of distal right  fibula is noted. IMPRESSION: Status post internal fixation of severely comminuted distal right tibial fracture. Status post surgical resection of distal fibula. Electronically Signed   By: Lupita RaiderJames  Green Jr, M.D.   On: 02/06/2015 15:25   I have personally reviewed and evaluated these images and lab results as part of my medical decision-making.   EKG Interpretation None      MDM   Final diagnoses:  Ankle pain, right   Patient is a 39 year old female with history of osteomyelitis of the right ankle, bipolar disorder presenting status post surgery on the right ankle in November 23. At that time she had removal of deep hardware and placement of antibiotic beads and  irrigation and stablilization of the tibial talar joint with external fixator. Patient reports that she tripped yesterday she's been having increasing pain in the right ankle. No fevers. Mild nausea. We will take an x-ray to make sure the fixator someplace. We will discuss case with Dr.Duda.  4:01 PM Discussed with Dr. Lajoyce Cornersuda. Xray normal, no fever or white coutnr. Will have pt follow up in his office on Monday.   Pamela Maddy Randall AnLyn Besnik Febus, MD 02/06/15 1601

## 2015-02-16 ENCOUNTER — Emergency Department (HOSPITAL_COMMUNITY)
Admission: EM | Admit: 2015-02-16 | Discharge: 2015-02-16 | Disposition: A | Discharge status: Home / Self Care | Payer: Medicaid Other | Attending: Emergency Medicine | Admitting: Emergency Medicine

## 2015-02-16 ENCOUNTER — Encounter (HOSPITAL_COMMUNITY): Payer: Self-pay | Admitting: *Deleted

## 2015-02-16 ENCOUNTER — Emergency Department (HOSPITAL_COMMUNITY): Payer: Medicaid Other

## 2015-02-16 DIAGNOSIS — Y9389 Activity, other specified: Secondary | ICD-10-CM | POA: Diagnosis not present

## 2015-02-16 DIAGNOSIS — Z8614 Personal history of Methicillin resistant Staphylococcus aureus infection: Secondary | ICD-10-CM | POA: Insufficient documentation

## 2015-02-16 DIAGNOSIS — S99911A Unspecified injury of right ankle, initial encounter: Secondary | ICD-10-CM | POA: Insufficient documentation

## 2015-02-16 DIAGNOSIS — J45909 Unspecified asthma, uncomplicated: Secondary | ICD-10-CM | POA: Insufficient documentation

## 2015-02-16 DIAGNOSIS — Z8659 Personal history of other mental and behavioral disorders: Secondary | ICD-10-CM | POA: Insufficient documentation

## 2015-02-16 DIAGNOSIS — Z79899 Other long term (current) drug therapy: Secondary | ICD-10-CM | POA: Insufficient documentation

## 2015-02-16 DIAGNOSIS — W1839XA Other fall on same level, initial encounter: Secondary | ICD-10-CM | POA: Insufficient documentation

## 2015-02-16 DIAGNOSIS — Z9889 Other specified postprocedural states: Secondary | ICD-10-CM | POA: Insufficient documentation

## 2015-02-16 DIAGNOSIS — F319 Bipolar disorder, unspecified: Secondary | ICD-10-CM | POA: Insufficient documentation

## 2015-02-16 DIAGNOSIS — G8929 Other chronic pain: Secondary | ICD-10-CM | POA: Insufficient documentation

## 2015-02-16 DIAGNOSIS — Y998 Other external cause status: Secondary | ICD-10-CM | POA: Diagnosis not present

## 2015-02-16 DIAGNOSIS — M1990 Unspecified osteoarthritis, unspecified site: Secondary | ICD-10-CM | POA: Insufficient documentation

## 2015-02-16 DIAGNOSIS — F17210 Nicotine dependence, cigarettes, uncomplicated: Secondary | ICD-10-CM | POA: Insufficient documentation

## 2015-02-16 DIAGNOSIS — Z791 Long term (current) use of non-steroidal anti-inflammatories (NSAID): Secondary | ICD-10-CM | POA: Insufficient documentation

## 2015-02-16 DIAGNOSIS — M25571 Pain in right ankle and joints of right foot: Secondary | ICD-10-CM

## 2015-02-16 DIAGNOSIS — F419 Anxiety disorder, unspecified: Secondary | ICD-10-CM | POA: Insufficient documentation

## 2015-02-16 DIAGNOSIS — Y9289 Other specified places as the place of occurrence of the external cause: Secondary | ICD-10-CM | POA: Insufficient documentation

## 2015-02-16 DIAGNOSIS — Z8619 Personal history of other infectious and parasitic diseases: Secondary | ICD-10-CM | POA: Insufficient documentation

## 2015-02-16 LAB — CBC WITH DIFFERENTIAL/PLATELET
BASOS ABS: 0 10*3/uL (ref 0.0–0.1)
Basophils Relative: 0 %
EOS PCT: 1 %
Eosinophils Absolute: 0.1 10*3/uL (ref 0.0–0.7)
HCT: 33.7 % — ABNORMAL LOW (ref 36.0–46.0)
HEMOGLOBIN: 10.1 g/dL — AB (ref 12.0–15.0)
LYMPHS PCT: 33 %
Lymphs Abs: 2.1 10*3/uL (ref 0.7–4.0)
MCH: 23.7 pg — ABNORMAL LOW (ref 26.0–34.0)
MCHC: 30 g/dL (ref 30.0–36.0)
MCV: 79.1 fL (ref 78.0–100.0)
Monocytes Absolute: 0.5 10*3/uL (ref 0.1–1.0)
Monocytes Relative: 8 %
NEUTROS ABS: 3.7 10*3/uL (ref 1.7–7.7)
NEUTROS PCT: 58 %
PLATELETS: 501 10*3/uL — AB (ref 150–400)
RBC: 4.26 MIL/uL (ref 3.87–5.11)
RDW: 17 % — ABNORMAL HIGH (ref 11.5–15.5)
WBC: 6.4 10*3/uL (ref 4.0–10.5)

## 2015-02-16 LAB — BASIC METABOLIC PANEL
ANION GAP: 8 (ref 5–15)
BUN: 12 mg/dL (ref 6–20)
CO2: 24 mmol/L (ref 22–32)
Calcium: 9.5 mg/dL (ref 8.9–10.3)
Chloride: 108 mmol/L (ref 101–111)
Creatinine, Ser: 0.78 mg/dL (ref 0.44–1.00)
GFR calc Af Amer: >60 mL/min (ref >60)
GLUCOSE: 111 mg/dL — AB (ref 65–99)
POTASSIUM: 4.5 mmol/L (ref 3.5–5.1)
SODIUM: 140 mmol/L (ref 135–145)

## 2015-02-16 LAB — C-REACTIVE PROTEIN: CRP: 2 mg/dL — ABNORMAL HIGH (ref <1.0)

## 2015-02-16 LAB — SEDIMENTATION RATE: SED RATE: 88 mm/h — AB (ref 0–22)

## 2015-02-16 MED ORDER — HYDROCODONE-ACETAMINOPHEN 5-325 MG PO TABS
1.0000 | ORAL_TABLET | Freq: Once | ORAL | Status: AC
Start: 2015-02-16 — End: 2015-02-16
  Administered 2015-02-16: 1 via ORAL
  Filled 2015-02-16: qty 1

## 2015-02-16 MED ORDER — HYDROCODONE-ACETAMINOPHEN 5-325 MG PO TABS: 1.0000 | ORAL_TABLET | ORAL | Status: DC | PRN

## 2015-02-16 NOTE — ED Provider Notes (Signed)
CSN: [id number]     Arrival date & time 02/16/15  1712 History   First MD Initiated Contact with Patient 02/16/15 1817     Chief Complaint  Patient presents with  . Fall  . Post-op Problem     (Consider location/radiation/quality/duration/timing/severity/associated sxs/prior Treatment) Patient is a 39 y.o. female presenting with ankle pain. The history is provided by the patient.  Ankle Pain Location:  Ankle Injury: yes   Ankle location:  R ankle Pain details:    Quality:  Aching   Radiates to:  Does not radiate   Severity:  Moderate   Onset quality:  Gradual   Timing:  Constant   Progression:  Worsening Chronicity:  Recurrent Tetanus status:  Up to date Prior injury to area:  Yes Relieved by:  Nothing Worsened by:  Activity Ineffective treatments:  Elevation and immobilization Associated symptoms: decreased ROM, numbness, stiffness and tingling   Associated symptoms: no back pain, no fatigue, no fever, no itching, no muscle weakness, no neck pain and no swelling     Past Medical History  Diagnosis Date  . Bipolar disorder ([place])   . 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 GERD (gastroesophageal reflux disease)   . Nerve damage of right foot   . Arthritis   . MRSA infection   . Migraine     "weekly" (12/17/2014)  . Chronic back pain     "middle and lower" (12/17/2014)  . Anxiety   . Depression   . Infection right ankle  . Asthma     PHM   Past Surgical History  Procedure Laterality Date  . Tubal ligation    . Hardware removal Right 09/02/2014    Procedure: Removal Right Lateral Ankle Hardware, Place Antibiotic Bead ;  Surgeon: Newt Minion, MD;  Location: [facility];  Service: Orthopedics;  Laterality: Right;  . Ankle fracture surgery Right 82016  . Fracture surgery    . Dilation and curettage of uterus  X 2  . Incision and drainage abscess Right 12/19/2014    Procedure: INCISION AND DRAINAGE OF ABSCESS ON RIGHT SIDE OF CHIN;  Surgeon: Melida Quitter, MD;   Location: [place];  Service: ENT;  Laterality: Right;  . Ankle fusion Right 01/19/2015    Procedure: Right Tibiotalar Fusion, Place Antibiotic Beads;  Surgeon: Newt Minion, MD;  Location: [place];  Service: Orthopedics;  Laterality: Right;  . External fixation leg Right 01/19/2015    Procedure: EXTERNAL FIXATION Right Ankle;  Surgeon: Newt Minion, MD;  Location: [place];  Service: Orthopedics;  Laterality: Right;   Family History  Problem Relation Age of Onset  . Cancer Mother     right hip cancer    Social History  Substance Use Topics  . Smoking status: Current Every Day Smoker -- 0.50 packs/day for 25 years    Types: Cigarettes  . Smokeless tobacco: Never Used  . Alcohol Use: Yes     Comment:  " 7 months ago " 01/18/15   OB History    No data available     Review of Systems  Constitutional: Negative for fever and fatigue.  Eyes: Negative for pain and visual disturbance.  Respiratory: Negative for chest tightness and shortness of breath.   Cardiovascular: Negative for chest pain.  Gastrointestinal: Negative for nausea, vomiting and abdominal pain.  Genitourinary: Negative for dysuria.  Musculoskeletal: Positive for joint swelling, arthralgias, gait problem and stiffness. Negative for back pain and neck pain.  Skin:  Positive for wound. Negative for itching.  Neurological: Negative for headaches.      Allergies  Review of patient's allergies indicates no known allergies.  Home Medications   Prior to Admission medications   Medication Sig Start Date End Date Taking? Authorizing Provider  sulfamethoxazole-trimethoprim (BACTRIM DS,SEPTRA DS) 800-160 MG tablet Take 1 tablet by mouth 2 (two) times daily.   Yes Historical Provider, MD  traMADol (ULTRAM) 50 MG tablet Take 50 mg by mouth every 6 (six) hours as needed for moderate pain.   Yes Historical Provider, MD  ALPRAZolam Duanne Moron) 0.5 MG tablet Take 1 tablet (0.5 mg total) by mouth 2 (two) times daily. Take 0.26m by mouth in  the afternoon and take 0.555mby mouth at bedtime Patient not taking: Reported on 02/16/2015 12/22/14   DaOrson EvaMD  amitriptyline (ELAVIL) 10 MG tablet Take 1 tablet (10 mg total) by mouth at bedtime. Patient not taking: Reported on 02/16/2015 12/22/14   DaOrson EvaMD  buPROPion ([facility]) 100 MG 12 hr tablet Take 1 tablet (100 mg total) by mouth 2 (two) times daily. Patient not taking: Reported on 02/16/2015 12/22/14   DaOrson EvaMD  HYDROcodone-acetaminophen (NORCO/VICODIN) 5-325 MG tablet Take 1 tablet by mouth every 4 (four) hours as needed. 02/16/15   AnEsaw GrandchildMD  ibuprofen (ADVIL,MOTRIN) 800 MG tablet Take 1 tablet (800 mg total) by mouth 3 (three) times daily. Patient not taking: Reported on 02/16/2015 12/31/14   KaGloriann LoanPA-C  methocarbamol (ROBAXIN) 500 MG tablet Take 1 tablet (500 mg total) by mouth every 6 (six) hours as needed for muscle spasms. Patient not taking: Reported on 02/16/2015 01/20/15   ScMeredith PelMD  nicotine (NICODERM CQ - DOSED IN MG/24 HOURS) 21 mg/24hr patch Place 1 patch (21 mg total) onto the skin daily. Patient not taking: Reported on 12/16/2014 10/04/14   DaOrson EvaMD  ondansetron (ZOFRAN) 4 MG tablet Take 1 tablet (4 mg total) by mouth every 8 (eight) hours as needed for nausea or vomiting. Patient not taking: Reported on 02/16/2015 02/06/15   [name], MD  oxyCODONE (OXY IR/ROXICODONE) 5 MG immediate release tablet Take 1-2 tablets (5-10 mg total) by mouth every 3 (three) hours as needed for breakthrough pain. Patient not taking: Reported on 02/16/2015 01/20/15   ScMeredith PelMD  oxyCODONE-acetaminophen (PERCOCET/ROXICET) 5-325 MG tablet Take 1 tablet by mouth every 6 (six) hours as needed for severe pain. Patient not taking: Reported on 02/16/2015 02/06/15   [name], MD   BP 117/66 mmHg  Pulse 77  Temp(Src) 98.6 F (37 C) (Oral)  Resp 18  SpO2 97%  LMP 02/16/2015 Physical Exam  Constitutional:  She is oriented to person, place, and time. She appears well-developed and well-nourished. No distress.  HENT:  Head: Normocephalic and atraumatic.  Eyes: Conjunctivae are normal. Pupils are equal, round, and reactive to light.  Neck: Normal range of motion. Neck supple.  Cardiovascular: Normal rate and regular rhythm.  Exam reveals no gallop and no friction rub.   No murmur heard. Pulmonary/Chest: Effort normal and breath sounds normal. She has no wheezes. She has no rales.  Abdominal: Soft. Bowel sounds are normal. She exhibits no distension and no mass. There is no tenderness. There is no rebound and no guarding.  Musculoskeletal:       Right ankle: She exhibits decreased range of motion, swelling and deformity. Tenderness.       Right lower leg: She exhibits no tenderness, no  bony tenderness, no edema and no deformity.       Feet:  Neurological: She is alert and oriented to person, place, and time. No cranial nerve deficit.  Skin: Skin is warm and dry. She is not diaphoretic.    ED Course  Procedures (including critical care time) Labs Review Labs Reviewed  CBC WITH DIFFERENTIAL/PLATELET - Abnormal; Notable for the following:    Hemoglobin 10.1 (*)    HCT 33.7 (*)    MCH 23.7 (*)    RDW 17.0 (*)    Platelets 501 (*)    All other components within normal limits  BASIC METABOLIC PANEL - Abnormal; Notable for the following:    Glucose, Bld 111 (*)    All other components within normal limits  SEDIMENTATION RATE - Abnormal; Notable for the following:    Sed Rate 88 (*)    All other components within normal limits  C-REACTIVE PROTEIN - Abnormal; Notable for the following:    CRP 2.0 (*)    All other components within normal limits    Imaging Review Dg Ankle Complete Right  02/16/2015  CLINICAL DATA:  Pain at operative site, status post external fixation one month ago EXAM: RIGHT ANKLE - COMPLETE 3+ VIEW COMPARISON:  02/06/2015 FINDINGS: Three views of the right ankle  submitted. Again noted postsurgical changes in distal fibula with partial resection. Stable postsurgical changes question resection distal tibia. Again noted external fixation material with 2 proximal screws in tibial shaft. A transverse screw is noted passing through calcaneus. There is no significant change in alignment from prior exam. Diffuse soft tissue swelling around the ankle without change from prior exam. Stable bony fragments just medial to distal tibia and anterior to distal tibia seen on lateral view. Lateral view shows irregularity and erosion superior articular surface of the talus. Osteomyelitis cannot be excluded. Clinical correlation is necessary. IMPRESSION: Again noted postsurgical changes in distal fibula with partial resection. Stable postsurgical changes question resection distal tibia. Again noted external fixation material with 2 proximal screws in tibial shaft. A transverse screw is noted passing through calcaneus. There is no significant change in alignment from prior exam. Diffuse soft tissue swelling around the ankle without change from prior exam. Stable bony fragments just medial to distal tibia and anterior to distal tibia seen on lateral view. Lateral view shows irregularity and erosion superior articular surface of the talus. Osteomyelitis cannot be excluded. Clinical correlation is necessary. Electronically Signed   By: Lahoma Crocker M.D.   On: 02/16/2015 19:46   I have personally reviewed and evaluated these images and lab results as part of my medical decision-making.   EKG Interpretation None      MDM   Final diagnoses:  Right ankle pain    39 year old Caucasian female presents to the setting of right ankle pain. Patient has had multiple surgeries on ankle since initial traumatic injury. Most recent surgery in November. Patient reports she fell approximately 10 days ago and since that time she's had continued pain. She additionally reports she has had some mild  drainage out wound site. Patient has history of osteomyelitis in joint in the past. Patient has recently seen Dr. Sharol Given for this complaint and was started on Bactrim. Due to worsening pain patient presented to the emergency department. Patient reports some mild tingling and swelling in joint. She denies any headaches, fevers, rashes, chest pain, shortness of breath, leg swelling, falls.  On arrival patient was hemodynamically stable and afebrile. Patient had sensation intact distal  to injury. Wound site had no purulence, erythema, warmth, fluctuance. In setting of patient's history obtained CBC, BMP, ESR, CRP and x-ray of ankle. Additionally patient given pain medication.  No significant elevation in white blood cell count but inflammatory markers were elevated. Patient's x-ray with a person reviewed reveals possible osteomyelitis. No abnormality with external fixator or other part of joint. Considering this finding Dr. Erlinda Hong with orthopedic team was consulted for further recommendations.  Dr. Erlinda Hong recommended patient continue home Bactrim and be given short course of pain medication. He advised patient be discharged home with plan to call ortho team in the morning for close follow-up. Patient given strict return precautions and stable at time of discharge. Patient in agreement with plan.  Attending has seen and evaluated patient Dr. Audie Pinto is in agreement with plan.    Esaw Grandchild, MD 02/16/15 2051  Leonard Schwartz, MD 02/16/15 [id number]

## 2015-02-16 NOTE — ED Notes (Signed)
Pt reports surgery to right ankle in nov, had a fall one week ago and now having severe pain to ankle and drainage.

## 2015-02-16 NOTE — Discharge Instructions (Signed)
Ankle Pain Ankle pain is a common symptom. The bones, cartilage, tendons, and muscles of the ankle joint perform a lot of work each day. The ankle joint holds your body weight and allows you to move around. Ankle pain can occur on either side or back of 1 or both ankles. Ankle pain may be sharp and burning or dull and aching. There may be tenderness, stiffness, redness, or warmth around the ankle. The pain occurs more often when a person walks or puts pressure on the ankle. CAUSES  There are many reasons ankle pain can develop. It is important to work with your caregiver to identify the cause since many conditions can impact the bones, cartilage, muscles, and tendons. Causes for ankle pain include:  Injury, including a break (fracture), sprain, or strain often due to a fall, sports, or a high-impact activity.  Swelling (inflammation) of a tendon (tendonitis).  Achilles tendon rupture.  Ankle instability after repeated sprains and strains.  Poor foot alignment.  Pressure on a nerve (tarsal tunnel syndrome).  Arthritis in the ankle or the lining of the ankle.  Crystal formation in the ankle (gout or pseudogout). DIAGNOSIS  A diagnosis is based on your medical history, your symptoms, results of your physical exam, and results of diagnostic tests. Diagnostic tests may include X-ray exams or a computerized magnetic scan (magnetic resonance imaging, MRI). TREATMENT  Treatment will depend on the cause of your ankle pain and may include:  Keeping pressure off the ankle and limiting activities.  Using crutches or other walking support (a cane or brace).  Using rest, ice, compression, and elevation.  Participating in physical therapy or home exercises.  Wearing shoe inserts or special shoes.  Losing weight.  Taking medications to reduce pain or swelling or receiving an injection.  Undergoing surgery. HOME CARE INSTRUCTIONS   Only take over-the-counter or prescription medicines for  pain, discomfort, or fever as directed by your caregiver.  Put ice on the injured area.  Put ice in a plastic bag.  Place a towel between your skin and the bag.  Leave the ice on for 15-20 minutes at a time, 03-04 times a day.  Keep your leg raised (elevated) when possible to lessen swelling.  Avoid activities that cause ankle pain.  Follow specific exercises as directed by your caregiver.  Record how often you have ankle pain, the location of the pain, and what it feels like. This information may be helpful to you and your caregiver.  Ask your caregiver about returning to work or sports and whether you should drive.  Follow up with your caregiver for further examination, therapy, or testing as directed. SEEK MEDICAL CARE IF:   Pain or swelling continues or worsens beyond 1 week.  You have an oral temperature above 102 F (38.9 C).  You are feeling unwell or have chills.  You are having an increasingly difficult time with walking.  You have loss of sensation or other new symptoms.  You have questions or concerns. MAKE SURE YOU:   Understand these instructions.  Will watch your condition.  Will get help right away if you are not doing well or get worse.   This information is not intended to replace advice given to you by your health care provider. Make sure you discuss any questions you have with your health care provider.   Document Released: 08/02/2009 Document Revised: 05/07/2011 Document Reviewed: 09/14/2014 Elsevier Interactive Patient Education 2016 [employer].  Joint Pain Joint pain, which is also called  arthralgia, can be caused by many things. Joint pain often goes away when you follow your health care provider's instructions for relieving pain at home. However, joint pain can also be caused by conditions that require further treatment. Common causes of joint pain include:  Bruising in the area of the joint.  Overuse of the joint.  Wear and tear on  the joints that occur with aging (osteoarthritis).  Various other forms of arthritis.  A buildup of a crystal form of uric acid in the joint (gout).  Infections of the joint (septic arthritis) or of the bone (osteomyelitis). Your health care provider may recommend medicine to help with the pain. If your joint pain continues, additional tests may be needed to diagnose your condition. HOME CARE INSTRUCTIONS Watch your condition for any changes. Follow these instructions as directed to lessen the pain that you are feeling.  Take medicines only as directed by your health care provider.  Rest the affected area for as long as your health care provider says that you should. If directed to do so, raise the painful joint above the level of your heart while you are sitting or lying down.  Do not do things that cause or worsen pain.  If directed, apply ice to the painful area:  Put ice in a plastic bag.  Place a towel between your skin and the bag.  Leave the ice on for 20 minutes, 2-3 times per day.  Wear an elastic bandage, splint, or sling as directed by your health care provider. Loosen the elastic bandage or splint if your fingers or toes become numb and tingle, or if they turn cold and blue.  Begin exercising or stretching the affected area as directed by your health care provider. Ask your health care provider what types of exercise are safe for you.  Keep all follow-up visits as directed by your health care provider. This is important. SEEK MEDICAL CARE IF:  Your pain increases, and medicine does not help.  Your joint pain does not improve within 3 days.  You have increased bruising or swelling.  You have a fever.  You lose 10 lb (4.5 kg) or more without trying. SEEK IMMEDIATE MEDICAL CARE IF:  You are not able to move the joint.  Your fingers or toes become numb or they turn cold and blue.   This information is not intended to replace advice given to you by your health  care provider. Make sure you discuss any questions you have with your health care provider.   Document Released: 02/12/2005 Document Revised: 03/05/2014 Document Reviewed: 11/24/2013 Elsevier Interactive Patient Education [employer]2016 [employer] Inc.

## 2015-03-01 ENCOUNTER — Encounter (HOSPITAL_COMMUNITY): Payer: Self-pay | Admitting: Emergency Medicine

## 2015-03-01 DIAGNOSIS — Z6833 Body mass index (BMI) 33.0-33.9, adult: Secondary | ICD-10-CM

## 2015-03-01 DIAGNOSIS — D638 Anemia in other chronic diseases classified elsewhere: Secondary | ICD-10-CM | POA: Diagnosis present

## 2015-03-01 DIAGNOSIS — Z8614 Personal history of Methicillin resistant Staphylococcus aureus infection: Secondary | ICD-10-CM

## 2015-03-01 DIAGNOSIS — F419 Anxiety disorder, unspecified: Secondary | ICD-10-CM | POA: Diagnosis present

## 2015-03-01 DIAGNOSIS — L02415 Cutaneous abscess of right lower limb: Secondary | ICD-10-CM | POA: Diagnosis present

## 2015-03-01 DIAGNOSIS — M86171 Other acute osteomyelitis, right ankle and foot: Principal | ICD-10-CM | POA: Diagnosis present

## 2015-03-01 DIAGNOSIS — K219 Gastro-esophageal reflux disease without esophagitis: Secondary | ICD-10-CM | POA: Diagnosis present

## 2015-03-01 DIAGNOSIS — Z8659 Personal history of other mental and behavioral disorders: Secondary | ICD-10-CM

## 2015-03-01 DIAGNOSIS — J45909 Unspecified asthma, uncomplicated: Secondary | ICD-10-CM | POA: Diagnosis present

## 2015-03-01 DIAGNOSIS — F319 Bipolar disorder, unspecified: Secondary | ICD-10-CM | POA: Diagnosis present

## 2015-03-01 DIAGNOSIS — E876 Hypokalemia: Secondary | ICD-10-CM | POA: Diagnosis present

## 2015-03-01 DIAGNOSIS — M86671 Other chronic osteomyelitis, right ankle and foot: Secondary | ICD-10-CM | POA: Diagnosis present

## 2015-03-01 DIAGNOSIS — Z791 Long term (current) use of non-steroidal anti-inflammatories (NSAID): Secondary | ICD-10-CM

## 2015-03-01 DIAGNOSIS — L03115 Cellulitis of right lower limb: Secondary | ICD-10-CM | POA: Diagnosis present

## 2015-03-01 DIAGNOSIS — F1120 Opioid dependence, uncomplicated: Secondary | ICD-10-CM | POA: Diagnosis present

## 2015-03-01 DIAGNOSIS — R0902 Hypoxemia: Secondary | ICD-10-CM | POA: Diagnosis not present

## 2015-03-01 DIAGNOSIS — F17210 Nicotine dependence, cigarettes, uncomplicated: Secondary | ICD-10-CM | POA: Diagnosis present

## 2015-03-01 DIAGNOSIS — T400X5A Adverse effect of opium, initial encounter: Secondary | ICD-10-CM | POA: Diagnosis not present

## 2015-03-01 DIAGNOSIS — L97319 Non-pressure chronic ulcer of right ankle with unspecified severity: Secondary | ICD-10-CM | POA: Diagnosis present

## 2015-03-01 DIAGNOSIS — M1990 Unspecified osteoarthritis, unspecified site: Secondary | ICD-10-CM | POA: Diagnosis present

## 2015-03-01 LAB — COMPREHENSIVE METABOLIC PANEL
ALK PHOS: 64 U/L (ref 38–126)
ALT: 20 U/L (ref 14–54)
ANION GAP: 11 (ref 5–15)
AST: 21 U/L (ref 15–41)
Albumin: 3.2 g/dL — ABNORMAL LOW (ref 3.5–5.0)
BUN: 14 mg/dL (ref 6–20)
CALCIUM: 9.2 mg/dL (ref 8.9–10.3)
CO2: 20 mmol/L — AB (ref 22–32)
CREATININE: 0.55 mg/dL (ref 0.44–1.00)
Chloride: 106 mmol/L (ref 101–111)
Glucose, Bld: 107 mg/dL — ABNORMAL HIGH (ref 65–99)
Potassium: 3.3 mmol/L — ABNORMAL LOW (ref 3.5–5.1)
SODIUM: 137 mmol/L (ref 135–145)
Total Bilirubin: 0.3 mg/dL (ref 0.3–1.2)
Total Protein: 8.1 g/dL (ref 6.5–8.1)

## 2015-03-01 LAB — CBC
HCT: 32.6 % — ABNORMAL LOW (ref 36.0–46.0)
HEMOGLOBIN: 9.9 g/dL — AB (ref 12.0–15.0)
MCH: 24.2 pg — AB (ref 26.0–34.0)
MCHC: 30.4 g/dL (ref 30.0–36.0)
MCV: 79.7 fL (ref 78.0–100.0)
PLATELETS: 468 10*3/uL — AB (ref 150–400)
RBC: 4.09 MIL/uL (ref 3.87–5.11)
RDW: 17.2 % — ABNORMAL HIGH (ref 11.5–15.5)
WBC: 7.5 10*3/uL (ref 4.0–10.5)

## 2015-03-01 LAB — I-STAT CG4 LACTIC ACID, ED: Lactic Acid, Venous: 2.13 mmol/L (ref 0.5–2.0)

## 2015-03-01 MED ORDER — OXYCODONE-ACETAMINOPHEN 5-325 MG PO TABS
ORAL_TABLET | ORAL | Status: AC
  Filled 2015-03-01: qty 1

## 2015-03-01 MED ORDER — OXYCODONE-ACETAMINOPHEN 5-325 MG PO TABS
1.0000 | ORAL_TABLET | Freq: Once | ORAL | Status: AC
  Administered 2015-03-01: 1 via ORAL

## 2015-03-01 NOTE — ED Notes (Signed)
Pt has old fracture to right foot that was over 1 yr ago. Pt had external fixation placed on 01/19/15 and for the last 2 days has had increased pain and swelling in right foot. Pt has blisters around pins and redness.

## 2015-03-02 ENCOUNTER — Encounter (HOSPITAL_COMMUNITY): Payer: Self-pay | Admitting: Family Medicine

## 2015-03-02 ENCOUNTER — Inpatient Hospital Stay (HOSPITAL_COMMUNITY)
Admission: EM | Admit: 2015-03-02 | Discharge: 2015-03-08 | DRG: 475 | Disposition: A | Discharge status: Home Health | Payer: Medicaid Other | Attending: Internal Medicine | Admitting: Internal Medicine

## 2015-03-02 ENCOUNTER — Emergency Department (HOSPITAL_COMMUNITY): Payer: Medicaid Other

## 2015-03-02 DIAGNOSIS — M869 Osteomyelitis, unspecified: Secondary | ICD-10-CM | POA: Diagnosis present

## 2015-03-02 DIAGNOSIS — R509 Fever, unspecified: Secondary | ICD-10-CM | POA: Insufficient documentation

## 2015-03-02 DIAGNOSIS — F319 Bipolar disorder, unspecified: Secondary | ICD-10-CM | POA: Diagnosis present

## 2015-03-02 DIAGNOSIS — L0390 Cellulitis, unspecified: Secondary | ICD-10-CM | POA: Diagnosis present

## 2015-03-02 DIAGNOSIS — Z8614 Personal history of Methicillin resistant Staphylococcus aureus infection: Secondary | ICD-10-CM | POA: Diagnosis not present

## 2015-03-02 DIAGNOSIS — M1990 Unspecified osteoarthritis, unspecified site: Secondary | ICD-10-CM | POA: Diagnosis present

## 2015-03-02 DIAGNOSIS — L97319 Non-pressure chronic ulcer of right ankle with unspecified severity: Secondary | ICD-10-CM | POA: Diagnosis present

## 2015-03-02 DIAGNOSIS — Z791 Long term (current) use of non-steroidal anti-inflammatories (NSAID): Secondary | ICD-10-CM | POA: Diagnosis not present

## 2015-03-02 DIAGNOSIS — D638 Anemia in other chronic diseases classified elsewhere: Secondary | ICD-10-CM | POA: Diagnosis present

## 2015-03-02 DIAGNOSIS — R0902 Hypoxemia: Secondary | ICD-10-CM | POA: Diagnosis not present

## 2015-03-02 DIAGNOSIS — M86171 Other acute osteomyelitis, right ankle and foot: Secondary | ICD-10-CM | POA: Insufficient documentation

## 2015-03-02 DIAGNOSIS — M86671 Other chronic osteomyelitis, right ankle and foot: Secondary | ICD-10-CM

## 2015-03-02 DIAGNOSIS — Z8659 Personal history of other mental and behavioral disorders: Secondary | ICD-10-CM | POA: Diagnosis not present

## 2015-03-02 DIAGNOSIS — F1120 Opioid dependence, uncomplicated: Secondary | ICD-10-CM | POA: Diagnosis present

## 2015-03-02 DIAGNOSIS — J45909 Unspecified asthma, uncomplicated: Secondary | ICD-10-CM | POA: Diagnosis present

## 2015-03-02 DIAGNOSIS — L03115 Cellulitis of right lower limb: Secondary | ICD-10-CM | POA: Diagnosis present

## 2015-03-02 DIAGNOSIS — F419 Anxiety disorder, unspecified: Secondary | ICD-10-CM | POA: Diagnosis present

## 2015-03-02 DIAGNOSIS — M25571 Pain in right ankle and joints of right foot: Secondary | ICD-10-CM | POA: Diagnosis present

## 2015-03-02 DIAGNOSIS — E876 Hypokalemia: Secondary | ICD-10-CM | POA: Diagnosis present

## 2015-03-02 DIAGNOSIS — L02415 Cutaneous abscess of right lower limb: Secondary | ICD-10-CM | POA: Diagnosis present

## 2015-03-02 DIAGNOSIS — Z6833 Body mass index (BMI) 33.0-33.9, adult: Secondary | ICD-10-CM | POA: Diagnosis not present

## 2015-03-02 DIAGNOSIS — K219 Gastro-esophageal reflux disease without esophagitis: Secondary | ICD-10-CM | POA: Diagnosis present

## 2015-03-02 DIAGNOSIS — F17210 Nicotine dependence, cigarettes, uncomplicated: Secondary | ICD-10-CM | POA: Diagnosis present

## 2015-03-02 DIAGNOSIS — T400X5A Adverse effect of opium, initial encounter: Secondary | ICD-10-CM | POA: Diagnosis not present

## 2015-03-02 LAB — C-REACTIVE PROTEIN: CRP: 1.7 mg/dL — AB (ref <1.0)

## 2015-03-02 LAB — BASIC METABOLIC PANEL
ANION GAP: 8 (ref 5–15)
BUN: 12 mg/dL (ref 6–20)
CHLORIDE: 107 mmol/L (ref 101–111)
CO2: 22 mmol/L (ref 22–32)
Calcium: 8.7 mg/dL — ABNORMAL LOW (ref 8.9–10.3)
Creatinine, Ser: 0.51 mg/dL (ref 0.44–1.00)
GFR calc non Af Amer: >60 mL/min (ref >60)
Glucose, Bld: 140 mg/dL — ABNORMAL HIGH (ref 65–99)
POTASSIUM: 3.5 mmol/L (ref 3.5–5.1)
Sodium: 137 mmol/L (ref 135–145)

## 2015-03-02 LAB — MAGNESIUM: Magnesium: 1.8 mg/dL (ref 1.7–2.4)

## 2015-03-02 LAB — CBC
HEMATOCRIT: 33.2 % — AB (ref 36.0–46.0)
HEMOGLOBIN: 9.9 g/dL — AB (ref 12.0–15.0)
MCH: 24 pg — ABNORMAL LOW (ref 26.0–34.0)
MCHC: 29.8 g/dL — ABNORMAL LOW (ref 30.0–36.0)
MCV: 80.4 fL (ref 78.0–100.0)
Platelets: 421 10*3/uL — ABNORMAL HIGH (ref 150–400)
RBC: 4.13 MIL/uL (ref 3.87–5.11)
RDW: 17.2 % — ABNORMAL HIGH (ref 11.5–15.5)
WBC: 6.9 10*3/uL (ref 4.0–10.5)

## 2015-03-02 LAB — I-STAT CG4 LACTIC ACID, ED: LACTIC ACID, VENOUS: 0.74 mmol/L (ref 0.5–2.0)

## 2015-03-02 LAB — PREALBUMIN: Prealbumin: 27 mg/dL (ref 18–38)

## 2015-03-02 LAB — SEDIMENTATION RATE: SED RATE: 80 mm/h — AB (ref 0–22)

## 2015-03-02 LAB — HIV ANTIBODY (ROUTINE TESTING W REFLEX): HIV Screen 4th Generation wRfx: NONREACTIVE

## 2015-03-02 MED ORDER — GABAPENTIN 300 MG PO CAPS
300.0000 mg | ORAL_CAPSULE | Freq: Three times a day (TID) | ORAL | Status: DC
  Administered 2015-03-02 – 2015-03-04 (×8): 300 mg via ORAL
  Filled 2015-03-02 (×8): qty 1

## 2015-03-02 MED ORDER — HYDROMORPHONE HCL 1 MG/ML IJ SOLN
1.0000 mg | INTRAMUSCULAR | Status: DC | PRN
  Administered 2015-03-02 – 2015-03-03 (×7): 1 mg via INTRAVENOUS
  Filled 2015-03-02 (×7): qty 1

## 2015-03-02 MED ORDER — ENOXAPARIN SODIUM 40 MG/0.4ML ~~LOC~~ SOLN
40.0000 mg | SUBCUTANEOUS | Status: DC
  Administered 2015-03-02 – 2015-03-03 (×2): 40 mg via SUBCUTANEOUS
  Filled 2015-03-02 (×2): qty 0.4

## 2015-03-02 MED ORDER — CEFAZOLIN SODIUM 1-5 GM-% IV SOLN
1.0000 g | Freq: Three times a day (TID) | INTRAVENOUS | Status: DC
Start: 2015-03-03 — End: 2015-03-08
  Administered 2015-03-03 – 2015-03-08 (×16): 1 g via INTRAVENOUS
  Filled 2015-03-02 (×21): qty 50

## 2015-03-02 MED ORDER — OXYCODONE-ACETAMINOPHEN 5-325 MG PO TABS
1.0000 | ORAL_TABLET | Freq: Four times a day (QID) | ORAL | Status: DC | PRN
  Administered 2015-03-02: 1 via ORAL
  Filled 2015-03-02: qty 1

## 2015-03-02 MED ORDER — ACETAMINOPHEN 650 MG RE SUPP: 650.0000 mg | Freq: Four times a day (QID) | RECTAL | Status: DC | PRN

## 2015-03-02 MED ORDER — ALPRAZOLAM 0.5 MG PO TABS
0.5000 mg | ORAL_TABLET | Freq: Two times a day (BID) | ORAL | Status: DC
  Administered 2015-03-02 – 2015-03-08 (×14): 0.5 mg via ORAL
  Filled 2015-03-02 (×6): qty 1
  Filled 2015-03-02: qty 2
  Filled 2015-03-02 (×8): qty 1

## 2015-03-02 MED ORDER — HYDROMORPHONE HCL 1 MG/ML IJ SOLN
2.0000 mg | Freq: Once | INTRAMUSCULAR | Status: AC
  Administered 2015-03-02: 2 mg via INTRAMUSCULAR
  Filled 2015-03-02 (×2): qty 2

## 2015-03-02 MED ORDER — HYDROMORPHONE HCL 1 MG/ML IJ SOLN
1.0000 mg | Freq: Once | INTRAMUSCULAR | Status: AC
  Administered 2015-03-02: 1 mg via INTRAVENOUS
  Filled 2015-03-02: qty 1

## 2015-03-02 MED ORDER — ACETAMINOPHEN 325 MG PO TABS
650.0000 mg | ORAL_TABLET | Freq: Four times a day (QID) | ORAL | Status: DC | PRN
  Administered 2015-03-02 – 2015-03-03 (×2): 650 mg via ORAL
  Filled 2015-03-02 (×2): qty 2

## 2015-03-02 MED ORDER — POTASSIUM CHLORIDE CRYS ER 20 MEQ PO TBCR
40.0000 meq | EXTENDED_RELEASE_TABLET | Freq: Once | ORAL | Status: AC
  Administered 2015-03-02: 40 meq via ORAL
  Filled 2015-03-02: qty 2

## 2015-03-02 MED ORDER — VANCOMYCIN HCL 10 G IV SOLR
1500.0000 mg | Freq: Once | INTRAVENOUS | Status: DC
  Administered 2015-03-02: 1500 mg via INTRAVENOUS
  Filled 2015-03-02: qty 1500

## 2015-03-02 MED ORDER — SENNOSIDES-DOCUSATE SODIUM 8.6-50 MG PO TABS: 1.0000 | ORAL_TABLET | Freq: Every evening | ORAL | Status: DC | PRN

## 2015-03-02 MED ORDER — OXYCODONE-ACETAMINOPHEN 5-325 MG PO TABS
1.0000 | ORAL_TABLET | ORAL | Status: DC | PRN
  Administered 2015-03-02: 1 via ORAL
  Administered 2015-03-02 – 2015-03-05 (×10): 2 via ORAL
  Filled 2015-03-02 (×10): qty 2

## 2015-03-02 MED ORDER — SODIUM CHLORIDE 0.9 % IV SOLN
INTRAVENOUS | Status: DC
  Administered 2015-03-02 – 2015-03-03 (×3): via INTRAVENOUS

## 2015-03-02 MED ORDER — ALUM & MAG HYDROXIDE-SIMETH 200-200-20 MG/5ML PO SUSP
15.0000 mL | ORAL | Status: DC | PRN
  Administered 2015-03-02 – 2015-03-08 (×10): 15 mL via ORAL
  Filled 2015-03-02 (×10): qty 30

## 2015-03-02 MED ORDER — DEXTROSE 5 % IV SOLN
2.0000 g | Freq: Once | INTRAVENOUS | Status: AC
  Administered 2015-03-02: 2 g via INTRAVENOUS
  Filled 2015-03-02: qty 2

## 2015-03-02 NOTE — ED Notes (Signed)
Sent message to main pharm about sending vancomycin

## 2015-03-02 NOTE — ED Notes (Signed)
Pt requesting pain medicine, EDP aware and at bedside currently.

## 2015-03-02 NOTE — Progress Notes (Signed)
Patient admitted earlier this morning. H&P reviewed. Patient seen and examined.  S: Patient complains of pain in her right leg. She is very disappointed that the only course of action left is an amputation.  O: Vital signs reviewed.  Lungs are clear to auscultation bilaterally. S1, S2 is normal, regular, no S3 is noted rubs, murmurs, or bruit Abdomen is soft, nontender, nondistended Awake alert. Oriented 3. 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 Presents with cellulitis and recurrence of osteomyelitis. Continue intravenous antibiotics. Orthopedic surgeon has evaluated the patient and is recommending amputation. Surgery is planned for Friday. Continue current management for now. Adjust pain medications.  Rest as per H&P.  Osvaldo ShipperKRISHNAN,Lamonta Cypress 03/02/2015

## 2015-03-02 NOTE — ED Provider Notes (Signed)
CSN: [id number]     Arrival date & time 03/01/15  2106 History  By signing my name below, I, Monique [name], attest that this documentation has been prepared under the direction and in the presence of Monique Crumble, MD . Electronically Signed: Freida [name], Scribe. 03/02/2015. 1:04 AM.     Chief Complaint  Patient presents with  . Post-op Problem  . Foot Pain    The history is provided by the patient. No language interpreter was used.   HPI Comments:  Monique [name] is a 40 y.o. female who presents to the Emergency Department s/p external fixation of the right ankle on 01/19/15 complaining of increased pain to her right ankle x ~ 2 days. She reports difficulty sleeping due to pain and notes an increase in swelling at the site. She has been taking percocet and Neurontin with minimal relief. She called surgeon yesterday and has follow up appointment scheduled for today (03/02/15) at 1530.  (Orthopaedic surgeon- Monique [name])    Past Medical History  Diagnosis Date  . Bipolar disorder (HCC)   . 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 GERD (gastroesophageal reflux disease)   . Nerve damage of right foot   . Arthritis   . MRSA infection   . Migraine     "weekly" (12/17/2014)  . Chronic back pain     "middle and lower" (12/17/2014)  . Anxiety   . Depression   . Infection right ankle  . Asthma     PHM   Past Surgical History  Procedure Laterality Date  . Tubal ligation    . Hardware removal Right 09/02/2014    Procedure: Removal Right Lateral Ankle Hardware, Place Antibiotic Bead ;  Surgeon: Monique Mustard, MD;  Location: [facility];  Service: Orthopedics;  Laterality: Right;  . Ankle fracture surgery Right 82016  . Fracture surgery    . Dilation and curettage of uterus  X 2  . Incision and drainage abscess Right 12/19/2014    Procedure: INCISION AND DRAINAGE OF ABSCESS ON RIGHT SIDE OF CHIN;  Surgeon: Monique Reading, MD;  Location: [facility] OR;  Service: ENT;  Laterality: Right;  . Ankle fusion  Right 01/19/2015    Procedure: Right Tibiotalar Fusion, Place Antibiotic Beads;  Surgeon: Monique Mustard, MD;  Location: [facility] OR;  Service: Orthopedics;  Laterality: Right;  . External fixation leg Right 01/19/2015    Procedure: EXTERNAL FIXATION Right Ankle;  Surgeon: Monique Mustard, MD;  Location: [facility] OR;  Service: Orthopedics;  Laterality: Right;   Family History  Problem Relation Age of Onset  . Cancer Mother     right hip cancer    Social History  Substance Use Topics  . Smoking status: Current Every Day Smoker -- 0.50 packs/day for 25 years    Types: Cigarettes  . Smokeless tobacco: Never Used  . Alcohol Use: Yes     Comment:  " 7 months ago " 01/18/15   OB History    No data available     Review of Systems  10 systems reviewed and all are negative for acute change except as noted in the HPI.    Allergies  Review of patient's allergies indicates no known allergies.  Home Medications   Prior to Admission medications   Medication Sig Start Date End Date Taking? Authorizing Provider  ALPRAZolam Prudy Feeler) 0.5 MG tablet Take 1 tablet (0.5 mg total) by mouth 2 (two) times daily. Take 0.5mg  by mouth in the afternoon and  take 0.5mg  by mouth at bedtime Patient not taking: Reported on 02/16/2015 12/22/14   Monique Hartshornavid Tat, MD  amitriptyline (ELAVIL) 10 MG tablet Take 1 tablet (10 mg total) by mouth at bedtime. Patient not taking: Reported on 02/16/2015 12/22/14   Monique Hartshornavid Tat, MD  buPROPion [facility](WELLBUTRIN SR) 100 MG 12 hr tablet Take 1 tablet (100 mg total) by mouth 2 (two) times daily. Patient not taking: Reported on 02/16/2015 12/22/14   Monique Hartshornavid Tat, MD  HYDROcodone-acetaminophen (NORCO/VICODIN) 5-325 MG tablet Take 1 tablet by mouth every 4 (four) hours as needed. 02/16/15   [name], MD  ibuprofen (ADVIL,MOTRIN) 800 MG tablet Take 1 tablet (800 mg total) by mouth 3 (three) times daily. Patient not taking: Reported on 02/16/2015 12/31/14   Monique FowlerKayla Rose, PA-C  methocarbamol (ROBAXIN) 500 MG  tablet Take 1 tablet (500 mg total) by mouth every 6 (six) hours as needed for muscle spasms. Patient not taking: Reported on 02/16/2015 01/20/15   Monique CopaScott Gregory Dean, MD  nicotine (NICODERM CQ - DOSED IN MG/24 HOURS) 21 mg/24hr patch Place 1 patch (21 mg total) onto the skin daily. Patient not taking: Reported on 12/16/2014 10/04/14   Monique Hartshornavid Tat, MD  ondansetron (ZOFRAN) 4 MG tablet Take 1 tablet (4 mg total) by mouth every 8 (eight) hours as needed for nausea or vomiting. Patient not taking: Reported on 02/16/2015 02/06/15   Monique Lyn Mackuen, MD  oxyCODONE (OXY IR/ROXICODONE) 5 MG immediate release tablet Take 1-2 tablets (5-10 mg total) by mouth every 3 (three) hours as needed for breakthrough pain. Patient not taking: Reported on 02/16/2015 01/20/15   Monique CopaScott Gregory Dean, MD  oxyCODONE-acetaminophen (PERCOCET/ROXICET) 5-325 MG tablet Take 1 tablet by mouth every 6 (six) hours as needed for severe pain. Patient not taking: Reported on 02/16/2015 02/06/15   Monique Lyn Mackuen, MD  sulfamethoxazole-trimethoprim (BACTRIM DS,SEPTRA DS) 800-160 MG tablet Take 1 tablet by mouth 2 (two) times daily.    Historical Provider, MD  traMADol (ULTRAM) 50 MG tablet Take 50 mg by mouth every 6 (six) hours as needed for moderate pain.    Historical Provider, MD   BP 124/79 mmHg  Pulse 85  Temp(Src) 98.4 F (36.9 C) (Oral)  Resp 18  SpO2 100%  LMP 02/16/2015 Physical Exam  Constitutional: She is oriented to person, place, and time. She appears well-developed and well-nourished. No distress.  HENT:  Head: Normocephalic and atraumatic.  Nose: Nose normal.  Mouth/Throat: Oropharynx is clear and moist. No oropharyngeal exudate.  Eyes: Conjunctivae and EOM are normal. Pupils are equal, round, and reactive to light. No scleral icterus.  Neck: Normal range of motion. Neck supple. No JVD present. No tracheal deviation present. No thyromegaly present.  Cardiovascular: Normal rate, regular rhythm and normal  heart sounds.  Exam reveals no gallop and no friction rub.   No murmur heard. Pulmonary/Chest: Effort normal and breath sounds normal. No respiratory distress. She has no wheezes. She exhibits no tenderness.  Abdominal: Soft. Bowel sounds are normal. She exhibits no distension and no mass. There is no tenderness. There is no rebound and no guarding.  Musculoskeletal: Normal range of motion. She exhibits edema and tenderness.  Right external fixator Diffusely swollen to proximal right tibia Severe tenderness to light touch nml pulse and capillary refill No erythema  Lymphadenopathy:    She has no cervical adenopathy.  Neurological: She is alert and oriented to person, place, and time. No cranial nerve deficit. She exhibits normal muscle tone.  Skin: Skin is warm and dry.  No rash noted. No erythema. No pallor.  Nursing note and vitals reviewed.   ED Course  Procedures   DIAGNOSTIC STUDIES:  Oxygen Saturation is 100% on RA, normal by my interpretation.    COORDINATION OF CARE:  12:27 AM Discussed treatment plan with pt at bedside and pt agreed to plan.  Labs Review Labs Reviewed  COMPREHENSIVE METABOLIC PANEL - Abnormal; Notable for the following:    Potassium 3.3 (*)    CO2 20 (*)    Glucose, Bld 107 (*)    Albumin 3.2 (*)    All other components within normal limits  CBC - Abnormal; Notable for the following:    Hemoglobin 9.9 (*)    HCT 32.6 (*)    MCH 24.2 (*)    RDW 17.2 (*)    Platelets 468 (*)    All other components within normal limits  I-STAT CG4 LACTIC ACID, ED - Abnormal; Notable for the following:    Lactic Acid, Venous 2.13 (*)    All other components within normal limits  I-STAT CG4 LACTIC ACID, ED    Imaging Review Dg Ankle Complete Right  03/02/2015  CLINICAL DATA:  Possible postop infection, ankle surgery last November. Diffuse foot and ankle pain. EXAM: RIGHT ANKLE - COMPLETE 3+ VIEW COMPARISON:  Most recent radiographs 02/16/2015 FINDINGS: External  fixator in place. Postsurgical change about the ankle with distal fibular resection, single fibular screw remains. Distal tibial resection with unchanged alignment of the tibial talar articulation, mild apex medial talar tilt. Probable decreased dense in the talar dome from prior exam, however diffuse bony under mineralization is seen. Fragmentation of the medial malleolus remains. Progressive soft tissue edema from prior. IMPRESSION: 1. Post distal tibial resection and tibial talar fusion with increasing lucency about the talar dome, as well as increased soft tissue edema, concerning for soft tissue infection and osteomyelitis. Distal fibular resection without complication. 2. External fixator remains in place. Electronically Signed   By: Rubye Oaks M.D.   On: 03/02/2015 01:00   Dg Foot Complete Right  03/02/2015  CLINICAL DATA:  Diffuse foot and ankle pain, possible postop infection. Surgery last November. EXAM: RIGHT FOOT COMPLETE - 3+ VIEW COMPARISON:  No prior dedicated foot exams. FINDINGS: Ankle findings described with concurrently performed ankle radiographs. Diffuse bony under mineralization. Soft tissue edema about the dorsum of the foot. No bony destructive change involving the foot. IMPRESSION: Soft tissue edema about the foot.  No acute bony abnormality. Ankle findings described on concurrently performed ankle radiographs. Electronically Signed   By: Rubye Oaks M.D.   On: 03/02/2015 01:02   I have personally reviewed and evaluated these images and lab results as part of my medical decision-making.   EKG Interpretation None      MDM   Final diagnoses:  None     patient presents emergency department for evaluation of right foot pain. She states her pin but has become more swollen and painful over the last couple days despite Percocet. X-ray reveals osteomyelitis.  Patient given dilaudid for pain control.  I will page Dr. Lajoyce [name] or on call orthopaedist for admission.  He recs  for IV abx and medical admission, Dr. Maryfrances Bunnell accepts the patient.   I personally performed the services described in this documentation, which was scribed in my presence. The recorded information has been reviewed and is accurate.      Monique Crumble, MD 03/02/15 [phone]

## 2015-03-02 NOTE — Consult Note (Signed)
Reason for Consult: Osteomyelitis cellulitis right ankle Referring Physician: Dr. Krishnan  Monique [name] is an 39 y.o. female.  HPI: Patient is a 39-year-old woman who is status post limb salvage for infection of initial right ankle fracture. Patient is undergone limb salvage and currently has an external fixator with previous placement of antibiotic beads and resection of the infected bone right ankle. Patient presents at this time with acute pain redness swelling around the right ankle. Patient presented to the emergency room last night. I recommended that she follow-up in my office yesterday  but she stated that she could not get a ride.  Past Medical History  Diagnosis Date  . Bipolar disorder (HCC)   . 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 GERD (gastroesophageal reflux disease)   . Nerve damage of right foot   . Arthritis   . MRSA infection   . Migraine     "weekly" (12/17/2014)  . Chronic back pain     "middle and lower" (12/17/2014)  . Anxiety   . Depression   . Infection right ankle  . Asthma     PHM    Past Surgical History  Procedure Laterality Date  . Tubal ligation    . Hardware removal Right 09/02/2014    Procedure: Removal Right Lateral Ankle Hardware, Place Antibiotic Bead ;  Surgeon: Marcus Duda V, MD;  Location: [facility];  Service: Orthopedics;  Laterality: Right;  . Ankle fracture surgery Right 82016  . Fracture surgery    . Dilation and curettage of uterus  X 2  . Incision and drainage abscess Right 12/19/2014    Procedure: INCISION AND DRAINAGE OF ABSCESS ON RIGHT SIDE OF CHIN;  Surgeon: Dwight Bates, MD;  Location: [facility] OR;  Service: ENT;  Laterality: Right;  . Ankle fusion Right 01/19/2015    Procedure: Right Tibiotalar Fusion, Place Antibiotic Beads;  Surgeon: Marcus Duda V, MD;  Location: [facility] OR;  Service: Orthopedics;  Laterality: Right;  . External fixation leg Right 01/19/2015    Procedure: EXTERNAL FIXATION Right Ankle;  Surgeon: Marcus Duda V,  MD;  Location: [facility] OR;  Service: Orthopedics;  Laterality: Right;    Family History  Problem Relation Age of Onset  . Lung cancer Mother   . Cancer Maternal Uncle   . Cancer Maternal Grandfather   . Cancer Paternal Grandfather     Social History:  reports that she has been smoking Cigarettes.  She has a 12.5 pack-year smoking history. She has never used smokeless tobacco. She reports that she drinks alcohol. She reports that she uses illicit drugs (Cocaine, Marijuana, and Benzodiazepines).  Allergies: No Known Allergies  Medications: I have reviewed the patient's current medications.  Results for orders placed or performed during the hospital encounter of 03/02/15 (from the past 48 hour(s))  Comprehensive metabolic panel     Status: Abnormal   Collection Time: 03/01/15  9:26 PM  Result Value Ref Range   Sodium 137 135 - 145 mmol/L   Potassium 3.3 (L) 3.5 - 5.1 mmol/L   Chloride 106 101 - 111 mmol/L   CO2 20 (L) 22 - 32 mmol/L   Glucose, Bld 107 (H) 65 - 99 mg/dL   BUN 14 6 - 20 mg/dL   Creatinine, Ser 0.55 0.44 - 1.00 mg/dL   Calcium 9.2 8.9 - 10.3 mg/dL   Total Protein 8.1 6.5 - 8.1 g/dL   Albumin 3.2 (L) 3.5 - 5.0 g/dL   AST 21 15 -   41 U/L   ALT 20 14 - 54 U/L   Alkaline Phosphatase 64 38 - 126 U/L   Total Bilirubin 0.3 0.3 - 1.2 mg/dL   GFR calc non Af Amer >60 >60 mL/min   GFR calc Af Amer >60 >60 mL/min    Comment: (NOTE) The eGFR has been calculated using the CKD EPI equation. This calculation has not been validated in all clinical situations. eGFR's persistently <60 mL/min signify possible Chronic Kidney Disease.    Anion gap 11 5 - 15  CBC     Status: Abnormal   Collection Time: 03/01/15  9:26 PM  Result Value Ref Range   WBC 7.5 4.0 - 10.5 K/uL   RBC 4.09 3.87 - 5.11 MIL/uL   Hemoglobin 9.9 (L) 12.0 - 15.0 g/dL   HCT 32.6 (L) 36.0 - 46.0 %   MCV 79.7 78.0 - 100.0 fL   MCH 24.2 (L) 26.0 - 34.0 pg   MCHC 30.4 30.0 - 36.0 g/dL   RDW 17.2 (H) 11.5 - 15.5 %    Platelets 468 (H) 150 - 400 K/uL  I-Stat CG4 Lactic Acid, ED  (not at [facility])     Status: Abnormal   Collection Time: 03/01/15  9:38 PM  Result Value Ref Range   Lactic Acid, Venous 2.13 (HH) 0.5 - 2.0 mmol/L   Comment NOTIFIED PHYSICIAN   I-Stat CG4 Lactic Acid, ED  (not at [facility]     Status: None   Collection Time: 03/02/15 12:19 AM  Result Value Ref Range   Lactic Acid, Venous 0.74 0.5 - 2.0 mmol/L  CBC     Status: Abnormal   Collection Time: 03/02/15  6:55 AM  Result Value Ref Range   WBC 6.9 4.0 - 10.5 K/uL   RBC 4.13 3.87 - 5.11 MIL/uL   Hemoglobin 9.9 (L) 12.0 - 15.0 g/dL   HCT 33.2 (L) 36.0 - 46.0 %   MCV 80.4 78.0 - 100.0 fL   MCH 24.0 (L) 26.0 - 34.0 pg   MCHC 29.8 (L) 30.0 - 36.0 g/dL   RDW 17.2 (H) 11.5 - 15.5 %   Platelets 421 (H) 150 - 400 K/uL    Dg Ankle Complete Right  03/02/2015  CLINICAL DATA:  Possible postop infection, ankle surgery last November. Diffuse foot and ankle pain. EXAM: RIGHT ANKLE - COMPLETE 3+ VIEW COMPARISON:  Most recent radiographs 02/16/2015 FINDINGS: External fixator in place. Postsurgical change about the ankle with distal fibular resection, single fibular screw remains. Distal tibial resection with unchanged alignment of the tibial talar articulation, mild apex medial talar tilt. Probable decreased dense in the talar dome from prior exam, however diffuse bony under mineralization is seen. Fragmentation of the medial malleolus remains. Progressive soft tissue edema from prior. IMPRESSION: 1. Post distal tibial resection and tibial talar fusion with increasing lucency about the talar dome, as well as increased soft tissue edema, concerning for soft tissue infection and osteomyelitis. Distal fibular resection without complication. 2. External fixator remains in place. Electronically Signed   By: Jeb Levering M.D.   On: 03/02/2015 01:00   Dg Foot Complete Right  03/02/2015  CLINICAL DATA:  Diffuse foot and ankle pain, possible postop infection. Surgery  last November. EXAM: RIGHT FOOT COMPLETE - 3+ VIEW COMPARISON:  No prior dedicated foot exams. FINDINGS: Ankle findings described with concurrently performed ankle radiographs. Diffuse bony under mineralization. Soft tissue edema about the dorsum of the foot. No bony destructive change involving the foot. IMPRESSION: Soft tissue edema  about the foot.  No acute bony abnormality. Ankle findings described on concurrently performed ankle radiographs. Electronically Signed   By: Melanie  Ehinger M.D.   On: 03/02/2015 01:02    Review of Systems  All other systems reviewed and are negative.  Blood pressure 125/67, pulse 88, temperature 98.3 F (36.8 C), temperature source Oral, resp. rate 16, last menstrual period 02/16/2015, SpO2 97 %. Physical Exam On examination patient has increased redness and swelling and pain around the right ankle. The proximal pin tracks showed no redness no cellulitis no drainage distal pin tracks show no redness no cellulitis no drainage. Patient is exquisitely tender to light touch. Review of the radiographs shows destruction of the talar dome consistent with osteomyelitis there is also destructive lytic changes of the distal tibia Assessment/Plan: Assessment: Progressive osteomyelitis right ankle.  Plan: Discussed with the patient due to failure of limb salvage we would need to proceed with a transtibial amputation. Risks and benefits were discussed including persistent pain. Patient states she understands wishes to proceed with surgery. We will have her stay on the IV antibiotics to suppress the infection. We'll plan for transtibial amputation on Friday afternoon.  DUDA,MARCUS V 03/02/2015, 7:45 AM      

## 2015-03-02 NOTE — Consult Note (Signed)
WOC consult requested for right ankle.  Pt has been followed by Dr Lajoyce Cornersuda prior to admission and has hardware in place.  X-ray indicates: Post distal tibial resection and tibial talar fusion with increasing lucency about the talar dome, as well as increased soft tissue edema, concerning for soft tissue infection and osteomyelitis. This complex medical condition is beyond WOC scope of practice.  According to progress notes, ortho service will be consulted today.  Please refer to their team for further plan of care. Dry gauze dressing ordered for the bedside nurses until further recommendations available. Please re-consult if further assistance is needed.  Thank-you,  Cammie Mcgeeawn Randall Rampersad MSN, RN, CWOCN, [place]CN-AP, CNS [phone]

## 2015-03-02 NOTE — H&P (Signed)
History and Physical  Patient Name: Monique [name]     STM:[id number]    DOB: 03-17-1975    DOA: 03/02/2015 Referring physician: Michaele Offer, MD PCP: [name] Patient      Chief Complaint: Ankle pain  HPI: Monique [name] is a 40 y.o. 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  The patient reports about a week of increased right ankle pain, redness, swelling, and drainage.  The pain is located on the lateral right ankle and heel, is aching and sharp, and constant, worse with movement. She has had chills, emesis.  In the ED, the patient was afebrile, hemodynamically stable, had an elevated lactate and hypokalemia and no leukocytosis. An x-ray suggested increased lucency of the talar dome. The patient was discussed with Dr. Sharol Given on call who recommended parenteral antibiotics and medicine admission.  History is collected from the chart, as the patient tells a conflicting/distorted story.  The patient states that she broke her ankle in August 2013, had a surgical infection immediately after requiring PICC, then was discharged from the care of her orthopedist because of inability to pay so that she had to "go to the ER every month for a year for [oral] antibiotics", until she started coming to see Dr. Sharol Given here, who operated on her ankle in August 2016 and then November 2016.    Per CareEverywhere, the patient suffered a right trimalleolar fracture and syndesmotic rupture in 10/2013 at [facility], which was complicated by premature weightbearing/non-compliance and then cellulitis treated with 6 weeks IV antibiotics by PICC.  She subsequently no-showed her orthopedics appointments, was [facility] for pain/redness/swelling/drainage of her ankle, was discharged from ortho clinic for a time for no-show  appointments, and was last seen last May at which time Dr. Linton Rump recommended surgery to remove plate/screws followed by IV antibiotics.  The patient agreed to surgery, but never followed up.  Subsequently, she was found altered and admitted to [facility] with drug overdose.  Dr. Sharol Given removed her ankle hardware at that time, and she was treated with oral IV antibiotics for 12 weeks, which she did not take and was admitted again a month later for osteomyelitis, this time discharged to SNF with PICC, which she appears to have completed.    She underwent a fusion and antibiotic bead placement in Nov. 2016.     Review of Systems:  All other systems negative except as just noted or noted in the history of present illness.  No Known Allergies  Prior to Admission medications   Medication Sig Start Date End Date Taking? Authorizing Provider  ALPRAZolam Duanne Moron) 0.5 MG tablet Take 1 tablet (0.5 mg total) by mouth 2 (two) times daily. Take 0.'5mg'$  by mouth in the afternoon and take 0.'5mg'$  by mouth at bedtime 12/22/14  Yes Orson Eva, MD  gabapentin (NEURONTIN) 300 MG capsule Take 300 mg by mouth 3 (three) times daily.   Yes Historical Provider, MD  oxyCODONE-acetaminophen (PERCOCET/ROXICET) 5-325 MG tablet Take 1 tablet by mouth every 6 (six) hours as needed for severe pain. 02/06/15  Yes [name], MD  amitriptyline (ELAVIL) 10 MG tablet Take 1 tablet (10 mg total) by mouth at bedtime. Patient not taking: Reported on 02/16/2015 12/22/14   Orson Eva, MD  bu[facility] SR) 100 MG 12 hr tablet Take 1 tablet (100 mg total)  by mouth 2 (two) times daily. Patient not taking: Reported on 02/16/2015 12/22/14   Orson Eva, MD  ibuprofen (ADVIL,MOTRIN) 800 MG tablet Take 1 tablet (800 mg total) by mouth 3 (three) times daily. Patient not taking: Reported on 02/16/2015 12/31/14   Gloriann Loan, PA-C  methocarbamol (ROBAXIN) 500 MG tablet Take 1 tablet (500 mg total) by mouth every 6 (six) hours as needed for muscle  spasms. Patient not taking: Reported on 02/16/2015 01/20/15   Meredith Pel, MD  nicotine (NICODERM CQ - DOSED IN MG/24 HOURS) 21 mg/24hr patch Place 1 patch (21 mg total) onto the skin daily. Patient not taking: Reported on 12/16/2014 10/04/14   Orson Eva, MD    Past Medical History  Diagnosis Date  . Bipolar disorder ([place])   . Osteomyelitis of ankle ([place])     right  . GERD (gastroesophageal reflux disease)   . Nerve damage of right foot   . Arthritis   . MRSA infection   . Migraine     "weekly" (12/17/2014)  . Chronic back pain     "middle and lower" (12/17/2014)  . Anxiety   . Depression   . Infection right ankle  . Asthma     PHM    Past Surgical History  Procedure Laterality Date  . Tubal ligation    . Hardware removal Right 09/02/2014    Procedure: Removal Right Lateral Ankle Hardware, Place Antibiotic Bead ;  Surgeon: Newt Minion, MD;  Location: [facility];  Service: Orthopedics;  Laterality: Right;  . Ankle fracture surgery Right 82016  . Fracture surgery    . Dilation and curettage of uterus  X 2  . Incision and drainage abscess Right 12/19/2014    Procedure: INCISION AND DRAINAGE OF ABSCESS ON RIGHT SIDE OF CHIN;  Surgeon: Melida Quitter, MD;  Location: [place];  Service: ENT;  Laterality: Right;  . Ankle fusion Right 01/19/2015    Procedure: Right Tibiotalar Fusion, Place Antibiotic Beads;  Surgeon: Newt Minion, MD;  Location: [place];  Service: Orthopedics;  Laterality: Right;  . External fixation leg Right 01/19/2015    Procedure: EXTERNAL FIXATION Right Ankle;  Surgeon: Newt Minion, MD;  Location: [place];  Service: Orthopedics;  Laterality: Right;    Family history: family history includes Cancer in her maternal grandfather, maternal uncle, and paternal grandfather; Lung cancer in her mother.  Social History: Patient lives with a friend.  She does not work.  She smokes.  She denies illicit drugs.  She states that she has "maybe" a drink per day.        Physical Exam: BP 141/89 mmHg  Pulse 89  Temp(Src) 98.4 F (36.9 C) (Oral)  Resp 18  SpO2 100%  LMP 02/16/2015 General appearance: Well-developed, adult female, alert and in moderate distress from pain.   Eyes: Anicteric, conjunctiva pink, lids and lashes normal.     ENT: No nasal deformity, discharge, or epistaxis.  OP moist without lesions.  Poor denititon. Skin: Warm and dry.  Scattered folliculitis. Cardiac: RRR, nl S1-S2, no murmurs appreciated.   Respiratory: CTAB without rales or wheezes. Abdomen: Abdomen soft without rigidity.  No TTP. No ascites, distension.   MSK: No deformities or effusions.  The right ankle has external hardware.  There is moderate redness, warmth, and swelling that is tender to palpation in the right ankle, especially in the lateral ankle, where there is a small amound of purulent drainage from a small ulcer. Neuro: Sensorium intact and responding  to questions, attention normal.  Speech is fluent.  Moves all extremities equally and with normal coordination.    Psych: Behavior appropriate.  Affect normal.  No evidence of aural or visual hallucinations or delusions.       Labs on Admission:  The metabolic panel shows hypokalemia, normal renal function. Low albumin. Lactic acid elevated, cleared with fluids. The complete blood count shows chronic normocytic anemia.   Radiological Exams on Admission: Dg Ankle Complete Right 03/02/2015  CLINICAL DATA:  Possible postop infection, ankle surgery last November. Diffuse foot and ankle pain. EXAM: RIGHT ANKLE - COMPLETE 3+ VIEW COMPARISON:  Most recent radiographs 02/16/2015 FINDINGS: External fixator in place. Postsurgical change about the ankle with distal fibular resection, single fibular screw remains. Distal tibial resection with unchanged alignment of the tibial talar articulation, mild apex medial talar tilt. Probable decreased dense in the talar dome from prior exam, however diffuse bony under  mineralization is seen. Fragmentation of the medial malleolus remains. Progressive soft tissue edema from prior. IMPRESSION: 1. Post distal tibial resection and tibial talar fusion with increasing lucency about the talar dome, as well as increased soft tissue edema, concerning for soft tissue infection and osteomyelitis. Distal fibular resection without complication. 2. External fixator remains in place. Electronically Signed   By: Jeb Levering M.D.   On: 03/02/2015 01:00   Dg Foot Complete Right 03/02/2015  CLINICAL DATA:  Diffuse foot and ankle pain, possible postop infection. Surgery last November. EXAM: RIGHT FOOT COMPLETE - 3+ VIEW COMPARISON:  No prior dedicated foot exams. FINDINGS: Ankle findings described with concurrently performed ankle radiographs. Diffuse bony under mineralization. Soft tissue edema about the dorsum of the foot. No bony destructive change involving the foot. IMPRESSION: Soft tissue edema about the foot.  No acute bony abnormality. Ankle findings described on concurrently performed ankle radiographs. Electronically Signed   By: Jeb Levering M.D.   On: 03/02/2015 01:02       Assessment/Plan 1. Cellulitis with possible osteomyelitis:  New cellulitis with purulence in patient with history of MSSA and remote history of MRSA.   Chronic osteomyelitis during the last year.  There is no WBC, tachycardia, signs of systemic infection.  Lactatemia transient.  Sepsis syndrome doubted. -Check ESR, CRP, prealbumin, and HIV -Consult to Orthopedics, appreciate recommendations -Cefazolin 1g q8hrs IV   2. Chronic pain:  Patient is not able to consistently afford and take bupropion or amitriptyline. -Continue home oxycodone-acetaminophen and gabapentin  3. Chronic anxiety: -Continue home alprazolam  4. Hypokalemia: -Supplement once. -Check magnesium  5. Anemia of chronic disease: Stable. Presumed from chronic infection.        DVT PPx: Lovenox Diet:  Regular Consultants: Orthopedics Code Status: Full Family Communication: None  Medical decision making: What exists of the patient's previous chart and CareEverywhere was reviewed in depth and the case was discussed with Dr. Claudine Mouton. Patient seen 3:03 AM on 03/02/2015.  Disposition Plan:  Admit for IV antibiotics and Orthopedics consultation.      Edwin Dada [facility] Pager [phone]

## 2015-03-03 ENCOUNTER — Other Ambulatory Visit (HOSPITAL_COMMUNITY): Payer: Self-pay | Admitting: Orthopedic Surgery

## 2015-03-03 DIAGNOSIS — M86161 Other acute osteomyelitis, right tibia and fibula: Secondary | ICD-10-CM

## 2015-03-03 DIAGNOSIS — M8610 Other acute osteomyelitis, unspecified site: Secondary | ICD-10-CM

## 2015-03-03 DIAGNOSIS — R5082 Postprocedural fever: Secondary | ICD-10-CM

## 2015-03-03 DIAGNOSIS — M86171 Other acute osteomyelitis, right ankle and foot: Secondary | ICD-10-CM | POA: Insufficient documentation

## 2015-03-03 LAB — CBC
HCT: 30.5 % — ABNORMAL LOW (ref 36.0–46.0)
Hemoglobin: 9 g/dL — ABNORMAL LOW (ref 12.0–15.0)
MCH: 24 pg — AB (ref 26.0–34.0)
MCHC: 29.5 g/dL — AB (ref 30.0–36.0)
MCV: 81.3 fL (ref 78.0–100.0)
PLATELETS: 345 10*3/uL (ref 150–400)
RBC: 3.75 MIL/uL — AB (ref 3.87–5.11)
RDW: 17.4 % — AB (ref 11.5–15.5)
WBC: 4.5 10*3/uL (ref 4.0–10.5)

## 2015-03-03 LAB — BASIC METABOLIC PANEL
Anion gap: 8 (ref 5–15)
BUN: 10 mg/dL (ref 6–20)
CO2: 24 mmol/L (ref 22–32)
CREATININE: 0.44 mg/dL (ref 0.44–1.00)
Calcium: 8.9 mg/dL (ref 8.9–10.3)
Chloride: 103 mmol/L (ref 101–111)
GFR calc Af Amer: >60 mL/min (ref >60)
GLUCOSE: 152 mg/dL — AB (ref 65–99)
POTASSIUM: 3.9 mmol/L (ref 3.5–5.1)
Sodium: 135 mmol/L (ref 135–145)

## 2015-03-03 LAB — HEMOGLOBIN A1C
HEMOGLOBIN A1C: 5.6 % (ref 4.8–5.6)
Mean Plasma Glucose: 114 mg/dL

## 2015-03-03 MED ORDER — OXYCODONE HCL ER 10 MG PO T12A
10.0000 mg | EXTENDED_RELEASE_TABLET | Freq: Two times a day (BID) | ORAL | Status: DC
  Administered 2015-03-03 – 2015-03-04 (×4): 10 mg via ORAL
  Filled 2015-03-03 (×4): qty 1

## 2015-03-03 MED ORDER — HYDROMORPHONE HCL 1 MG/ML IJ SOLN
2.0000 mg | INTRAMUSCULAR | Status: DC | PRN
Start: 2015-03-03 — End: 2015-03-05
  Administered 2015-03-03 – 2015-03-05 (×10): 2 mg via INTRAVENOUS
  Filled 2015-03-03 (×9): qty 2

## 2015-03-03 NOTE — Progress Notes (Signed)
Informed pt to stay NPO p MN for possible OR tomorrow

## 2015-03-03 NOTE — Progress Notes (Signed)
[facility] PROGRESS NOTE  Monique [name] ZOX:[id number] DOB: May 12, 1975 DOA: 03/02/2015 PCP: [name] Patient  Length of stay: 1   Assessment/Plan: Principal Problem:   Osteomyelitis (HCC) Active Problems:   Cellulitis   Acute osteomyelitis of right foot (HCC)    40 y.o. 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 The patient reports about a week of increased right ankle pain, redness, swelling, and drainage. The pain is located on the lateral right ankle and heel, is aching and sharp, and constant, worse with movement. She has had chills, emesis.  The patient states that she broke her ankle in August 2013, had a surgical infection immediately after requiring PICC, then was discharged from the care of her orthopedist because of inability to pay so that she had to "go to the ER every month for a year for [oral] antibiotics", until she started coming to see Dr. [name] here, who operated on her ankle in August 2016 and then November 2016.   Patient suffered a right trimalleolar fracture and syndesmotic rupture in 10/2013 at [facility], which was complicated by premature weightbearing/non-compliance and then cellulitis treated with 6 weeks IV antibiotics by PICC. She subsequently no-showed her orthopedics appointments, was [facility] several [facility] jail for pain/redness/swelling/drainage of her ankle, was discharged from ortho clinic for a time for no-show appointments, and was last seen last May at which time Dr. Samuel Bouche recommended surgery to remove plate/screws followed by IV antibiotics. The patient agreed to surgery, but never followed up. Subsequently, she was found altered and admitted to [facility] with drug overdose.  Dr. [name] removed her ankle hardware at that time, and she was treated with oral IV  antibiotics for 12 weeks, which she did not take and was admitted again a month later for osteomyelitis, this time discharged to SNF with PICC, which she appears to have completed. She underwent a fusion and antibiotic bead placement in Nov. 2016. Patient evaluated by Dr. [name] on 03/02/15 and schedule for trans-tibial amputation on Friday afternoon.  Assessment and plan  1. Cellulitis with progressive osteomyelitis of the right ankle:  Has a history of MSSA and remote history of MRSA. Chronic osteomyelitis during the last year. There is no WBC, tachycardia, signs of systemic infection. Lactatemia transient. Sepsis syndrome doubted. Lactic acid 2.13> 0.74, CRP 1.7, HIV nonreactive Dr. [name] to perform transtibial amputation tomorrow afternoon -Continue Cefazolin 1g q8hrs IV   2. Chronic pain:  Patient is not able to consistently afford and take bupropion or amitriptyline. -Continue home oxycodone-acetaminophen and gabapentin  3. Chronic anxiety: -Continue home alprazolam  4. Hypokalemia: -Supplement once. -Check magnesium  5. Anemia of chronic disease: Stable. Presumed from chronic infection.     DVT prophylaxsis   Code Status:      Code Status Orders        Start     Ordered   03/02/15 0416  Full code   Continuous     03/02/15 0415      Family Communication: Discussed in detail with the patient, all imaging results, lab results explained to the patient   Disposition Plan: Transtibial amputation on Friday     Consultants:  Orthopedics  Procedures:  None  Antibiotics: Anti-infectives    Start     Dose/Rate Route Frequency Ordered Stop   03/03/15 0300  ceFAZolin (ANCEF)  IVPB 1 g/50 mL premix     1 g 100 mL/hr over 30 Minutes Intravenous 3 times per day 03/02/15 0415     03/02/15 0230  vancomycin (VANCOCIN) 1,500 mg in sodium chloride 0.9 % 500 mL IVPB  Status:  Discontinued     1,500 mg 250 mL/hr over 120 Minutes Intravenous  Once 03/02/15 0225  03/02/15 0415   03/02/15 0230  cefTRIAXone (ROCEPHIN) 2 g in dextrose 5 % 50 mL IVPB     2 g 100 mL/hr over 30 Minutes Intravenous  Once 03/02/15 0225 03/02/15 0334         HPI/Subjective: Afebrile, hemodynamically stable, surgery tomorrow  Objective: Filed Vitals:   03/02/15 0800 03/02/15 1352 03/02/15 2143 03/03/15 0628  BP:  125/91 146/84 122/72  Pulse:  92 86 85  Temp:  97.9 F (36.6 C) 98.8 F (37.1 C) 98.5 F (36.9 C)  TempSrc:  Oral Oral Oral  Resp:  20 18 18   Height: 5\' 4"  (1.626 m)     Weight: 88.451 kg (195 lb)     SpO2:  100% 99% 98%    Intake/Output Summary (Last 24 hours) at 03/03/15 1205 Last data filed at 03/03/15 0651  Gross per 24 hour  Intake 3800.83 ml  Output   1600 ml  Net 2200.83 ml    Exam:  General: No acute respiratory distress Lungs: Clear to auscultation bilaterally without wheezes or crackles Cardiovascular: Regular rate and rhythm without murmur gallop or rub normal S1 and S2 Abdomen: Nontender, nondistended, soft, bowel sounds positive, no rebound, no ascites, no appreciable mass On examination patient has increased redness and swelling and pain around the right ankle. The proximal pin tracks showed no redness no cellulitis no drainage distal pin tracks show no redness no cellulitis no drainage. Patient is exquisitely tender to light touch. Review of the radiographs shows destruction of the talar dome consistent with osteomyelitis there is also destructive lytic changes of the distal tibia     Data Review   Micro Results No results found for this or any previous visit (from the past 240 hour(s)).  Radiology Reports Dg Ankle Complete Right  03/02/2015  CLINICAL DATA:  Possible postop infection, ankle surgery last November. Diffuse foot and ankle pain. EXAM: RIGHT ANKLE - COMPLETE 3+ VIEW COMPARISON:  Most recent radiographs 02/16/2015 FINDINGS: External fixator in place. Postsurgical change about the ankle with distal fibular  resection, single fibular screw remains. Distal tibial resection with unchanged alignment of the tibial talar articulation, mild apex medial talar tilt. Probable decreased dense in the talar dome from prior exam, however diffuse bony under mineralization is seen. Fragmentation of the medial malleolus remains. Progressive soft tissue edema from prior. IMPRESSION: 1. Post distal tibial resection and tibial talar fusion with increasing lucency about the talar dome, as well as increased soft tissue edema, concerning for soft tissue infection and osteomyelitis. Distal fibular resection without complication. 2. External fixator remains in place. Electronically Signed   By: Rubye Oaks M.D.   On: 03/02/2015 01:00   Dg Ankle Complete Right  02/16/2015  CLINICAL DATA:  Pain at operative site, status post external fixation one month ago EXAM: RIGHT ANKLE - COMPLETE 3+ VIEW COMPARISON:  02/06/2015 FINDINGS: Three views of the right ankle submitted. Again noted postsurgical changes in distal fibula with partial resection. Stable postsurgical changes question resection distal tibia. Again noted external fixation material with 2 proximal screws in tibial shaft. A transverse screw is noted passing through calcaneus. There is  no significant change in alignment from prior exam. Diffuse soft tissue swelling around the ankle without change from prior exam. Stable bony fragments just medial to distal tibia and anterior to distal tibia seen on lateral view. Lateral view shows irregularity and erosion superior articular surface of the talus. Osteomyelitis cannot be excluded. Clinical correlation is necessary. IMPRESSION: Again noted postsurgical changes in distal fibula with partial resection. Stable postsurgical changes question resection distal tibia. Again noted external fixation material with 2 proximal screws in tibial shaft. A transverse screw is noted passing through calcaneus. There is no significant change in alignment  from prior exam. Diffuse soft tissue swelling around the ankle without change from prior exam. Stable bony fragments just medial to distal tibia and anterior to distal tibia seen on lateral view. Lateral view shows irregularity and erosion superior articular surface of the talus. Osteomyelitis cannot be excluded. Clinical correlation is necessary. Electronically Signed   By: Natasha Mead M.D.   On: 02/16/2015 19:46   Dg Ankle Complete Right  02/06/2015  CLINICAL DATA:  Right ankle pain after falling. EXAM: RIGHT ANKLE - COMPLETE 3+ VIEW COMPARISON:  December 31, 2014. FINDINGS: Status post internal fixation of of right lower leg, with proximal screws seen in the mid tibial shaft, and distal screw passing through calcaneus. Severely comminuted fracture of the distal right tibia is noted. There has been surgical resection of the distal fibula. Old healed fracture of distal right fibula is noted. IMPRESSION: Status post internal fixation of severely comminuted distal right tibial fracture. Status post surgical resection of distal fibula. Electronically Signed   By: Lupita Raider, M.D.   On: 02/06/2015 15:25   Dg Foot Complete Right  03/02/2015  CLINICAL DATA:  Diffuse foot and ankle pain, possible postop infection. Surgery last November. EXAM: RIGHT FOOT COMPLETE - 3+ VIEW COMPARISON:  No prior dedicated foot exams. FINDINGS: Ankle findings described with concurrently performed ankle radiographs. Diffuse bony under mineralization. Soft tissue edema about the dorsum of the foot. No bony destructive change involving the foot. IMPRESSION: Soft tissue edema about the foot.  No acute bony abnormality. Ankle findings described on concurrently performed ankle radiographs. Electronically Signed   By: Rubye Oaks M.D.   On: 03/02/2015 01:02     CBC  Recent Labs Lab 03/01/15 2126 03/02/15 0655 03/03/15 0630  WBC 7.5 6.9 4.5  HGB 9.9* 9.9* 9.0*  HCT 32.6* 33.2* 30.5*  PLT 468* 421* 345  MCV 79.7 80.4 81.3   MCH 24.2* 24.0* 24.0*  MCHC 30.4 29.8* 29.5*  RDW 17.2* 17.2* 17.4*    Chemistries   Recent Labs Lab 03/01/15 2126 03/02/15 0655 03/03/15 0630  NA 137 137 135  K 3.3* 3.5 3.9  CL 106 107 103  CO2 20* 22 24  GLUCOSE 107* 140* 152*  BUN 14 12 10   CREATININE 0.55 0.51 0.44  CALCIUM 9.2 8.7* 8.9  MG  --  1.8  --   AST 21  --   --   ALT 20  --   --   ALKPHOS 64  --   --   BILITOT 0.3  --   --    ------------------------------------------------------------------------------------------------------------------ estimated creatinine clearance is 101.6 mL/min (by C-G formula based on Cr of 0.44). ------------------------------------------------------------------------------------------------------------------  Recent Labs  03/02/15 0655  HGBA1C 5.6   ------------------------------------------------------------------------------------------------------------------ No results for input(s): CHOL, HDL, LDLCALC, TRIG, CHOLHDL, LDLDIRECT in the last 72 hours. ------------------------------------------------------------------------------------------------------------------ No results for input(s): TSH, T4TOTAL, T3FREE, THYROIDAB in the last 72 hours.  Invalid input(s): FREET3 ------------------------------------------------------------------------------------------------------------------ No results for input(s): VITAMINB12, FOLATE, FERRITIN, TIBC, IRON, RETICCTPCT in the last 72 hours.  Coagulation profile No results for input(s): INR, PROTIME in the last 168 hours.  No results for input(s): DDIMER in the last 72 hours.  Cardiac Enzymes No results for input(s): CKMB, TROPONINI, MYOGLOBIN in the last 168 hours.  Invalid input(s): CK ------------------------------------------------------------------------------------------------------------------ Invalid input(s): POCBNP   CBG: No results for input(s): GLUCAP in the last 168 hours.     Studies: Dg Ankle Complete  Right  03/02/2015  CLINICAL DATA:  Possible postop infection, ankle surgery last November. Diffuse foot and ankle pain. EXAM: RIGHT ANKLE - COMPLETE 3+ VIEW COMPARISON:  Most recent radiographs 02/16/2015 FINDINGS: External fixator in place. Postsurgical change about the ankle with distal fibular resection, single fibular screw remains. Distal tibial resection with unchanged alignment of the tibial talar articulation, mild apex medial talar tilt. Probable decreased dense in the talar dome from prior exam, however diffuse bony under mineralization is seen. Fragmentation of the medial malleolus remains. Progressive soft tissue edema from prior. IMPRESSION: 1. Post distal tibial resection and tibial talar fusion with increasing lucency about the talar dome, as well as increased soft tissue edema, concerning for soft tissue infection and osteomyelitis. Distal fibular resection without complication. 2. External fixator remains in place. Electronically Signed   By: Rubye OaksMelanie  Ehinger M.D.   On: 03/02/2015 01:00   Dg Foot Complete Right  03/02/2015  CLINICAL DATA:  Diffuse foot and ankle pain, possible postop infection. Surgery last November. EXAM: RIGHT FOOT COMPLETE - 3+ VIEW COMPARISON:  No prior dedicated foot exams. FINDINGS: Ankle findings described with concurrently performed ankle radiographs. Diffuse bony under mineralization. Soft tissue edema about the dorsum of the foot. No bony destructive change involving the foot. IMPRESSION: Soft tissue edema about the foot.  No acute bony abnormality. Ankle findings described on concurrently performed ankle radiographs. Electronically Signed   By: Rubye OaksMelanie  Ehinger M.D.   On: 03/02/2015 01:02      Lab Results  Component Value Date   HGBA1C 5.6 03/02/2015   Lab Results  Component Value Date   [facility]  12/10/2007    87        Total Cholesterol/HDL:CHD Risk Coronary Heart Disease Risk Table                     Men   Women  1/2 Average Risk   3.4   3.3    CREATININE 0.44 03/03/2015       Scheduled Meds: . ALPRAZolam  0.5 mg Oral BID  .  ceFAZolin (ANCEF) IV  1 g Intravenous 3 times per day  . enoxaparin (LOVENOX) injection  40 mg Subcutaneous Q24H  . gabapentin  300 mg Oral TID   Continuous Infusions: . sodium chloride 75 mL/hr at 03/02/15 1943    Principal Problem:   Osteomyelitis [facility](HCC) Active Problems:   Cellulitis   Acute osteomyelitis of right foot (HCC)    Time spent: 45 minutes   Blake Medical CenterBROL,Jasean Ambrosia  [facility] Pager [phone]. If 7PM-7AM, please contact night-coverage at [email or web address], password [email or web address] 03/03/2015, 12:05 PM  LOS: 1 day

## 2015-03-04 ENCOUNTER — Inpatient Hospital Stay (HOSPITAL_COMMUNITY): Payer: Medicaid Other | Admitting: Anesthesiology

## 2015-03-04 ENCOUNTER — Encounter (HOSPITAL_COMMUNITY): Payer: Self-pay | Admitting: Certified Registered Nurse Anesthetist

## 2015-03-04 ENCOUNTER — Encounter (HOSPITAL_COMMUNITY)
Admission: EM | Disposition: A | Discharge status: Home Health | Payer: Self-pay | Source: Home / Self Care | Attending: Internal Medicine

## 2015-03-04 HISTORY — PX: EXTERNAL FIXATION REMOVAL: SHX5040

## 2015-03-04 HISTORY — PX: AMPUTATION: SHX166

## 2015-03-04 LAB — COMPREHENSIVE METABOLIC PANEL
ALBUMIN: 3.1 g/dL — AB (ref 3.5–5.0)
ALT: 25 U/L (ref 14–54)
AST: 26 U/L (ref 15–41)
Alkaline Phosphatase: 56 U/L (ref 38–126)
Anion gap: 8 (ref 5–15)
BILIRUBIN TOTAL: 0.2 mg/dL — AB (ref 0.3–1.2)
BUN: 10 mg/dL (ref 6–20)
CHLORIDE: 98 mmol/L — AB (ref 101–111)
CO2: 29 mmol/L (ref 22–32)
CREATININE: 0.56 mg/dL (ref 0.44–1.00)
Calcium: 9.1 mg/dL (ref 8.9–10.3)
GFR calc Af Amer: >60 mL/min (ref >60)
GLUCOSE: 108 mg/dL — AB (ref 65–99)
POTASSIUM: 4.3 mmol/L (ref 3.5–5.1)
Sodium: 135 mmol/L (ref 135–145)
TOTAL PROTEIN: 7.8 g/dL (ref 6.5–8.1)

## 2015-03-04 LAB — I-STAT BETA HCG BLOOD, ED (NOT ORDERABLE)

## 2015-03-04 LAB — CBC
HEMATOCRIT: 29.8 % — AB (ref 36.0–46.0)
Hemoglobin: 8.8 g/dL — ABNORMAL LOW (ref 12.0–15.0)
MCH: 24 pg — ABNORMAL LOW (ref 26.0–34.0)
MCHC: 29.5 g/dL — ABNORMAL LOW (ref 30.0–36.0)
MCV: 81.2 fL (ref 78.0–100.0)
Platelets: 335 10*3/uL (ref 150–400)
RBC: 3.67 MIL/uL — AB (ref 3.87–5.11)
RDW: 17.1 % — AB (ref 11.5–15.5)
WBC: 4.4 10*3/uL (ref 4.0–10.5)

## 2015-03-04 SURGERY — AMPUTATION BELOW KNEE
Anesthesia: General | Site: Leg Lower | Laterality: Right

## 2015-03-04 MED ORDER — MEPERIDINE HCL 25 MG/ML IJ SOLN: 6.2500 mg | INTRAMUSCULAR | Status: DC | PRN

## 2015-03-04 MED ORDER — METHOCARBAMOL 1000 MG/10ML IJ SOLN
500.0000 mg | Freq: Four times a day (QID) | INTRAVENOUS | Status: DC | PRN
  Filled 2015-03-04: qty 5

## 2015-03-04 MED ORDER — HYDROMORPHONE HCL 1 MG/ML IJ SOLN
0.2500 mg | INTRAMUSCULAR | Status: DC | PRN
  Administered 2015-03-04 (×2): 0.5 mg via INTRAVENOUS

## 2015-03-04 MED ORDER — ACETAMINOPHEN 650 MG RE SUPP: 650.0000 mg | Freq: Four times a day (QID) | RECTAL | Status: DC | PRN

## 2015-03-04 MED ORDER — MIDAZOLAM HCL 2 MG/2ML IJ SOLN
0.5000 mg | Freq: Once | INTRAMUSCULAR | Status: AC | PRN
  Administered 2015-03-04: 2 mg via INTRAVENOUS

## 2015-03-04 MED ORDER — FENTANYL CITRATE (PF) 100 MCG/2ML IJ SOLN
INTRAMUSCULAR | Status: DC | PRN
  Administered 2015-03-04 (×2): 50 ug via INTRAVENOUS
  Administered 2015-03-04: 150 ug via INTRAVENOUS

## 2015-03-04 MED ORDER — OXYCODONE-ACETAMINOPHEN 5-325 MG PO TABS
ORAL_TABLET | ORAL | Status: AC
  Filled 2015-03-04: qty 2

## 2015-03-04 MED ORDER — HYDROMORPHONE HCL 1 MG/ML IJ SOLN
INTRAMUSCULAR | Status: AC
  Filled 2015-03-04: qty 2

## 2015-03-04 MED ORDER — MIDAZOLAM HCL 5 MG/5ML IJ SOLN
INTRAMUSCULAR | Status: DC | PRN
  Administered 2015-03-04: 2 mg via INTRAVENOUS

## 2015-03-04 MED ORDER — CEFAZOLIN SODIUM-DEXTROSE 2-3 GM-% IV SOLR: 2.0000 g | Freq: Four times a day (QID) | INTRAVENOUS | Status: DC

## 2015-03-04 MED ORDER — 0.9 % SODIUM CHLORIDE (POUR BTL) OPTIME
TOPICAL | Status: DC | PRN
  Administered 2015-03-04: 1000 mL

## 2015-03-04 MED ORDER — PHENYLEPHRINE HCL 10 MG/ML IJ SOLN
INTRAMUSCULAR | Status: DC | PRN
  Administered 2015-03-04: 80 ug via INTRAVENOUS

## 2015-03-04 MED ORDER — ONDANSETRON HCL 4 MG PO TABS: 4.0000 mg | ORAL_TABLET | Freq: Four times a day (QID) | ORAL | Status: DC | PRN

## 2015-03-04 MED ORDER — PROPOFOL 10 MG/ML IV BOLUS
INTRAVENOUS | Status: DC | PRN
  Administered 2015-03-04: 200 mg via INTRAVENOUS

## 2015-03-04 MED ORDER — SODIUM CHLORIDE 0.9 % IV SOLN
INTRAVENOUS | Status: DC
Start: 2015-03-04 — End: 2015-03-08
  Administered 2015-03-04 – 2015-03-06 (×2): via INTRAVENOUS

## 2015-03-04 MED ORDER — LIDOCAINE HCL (CARDIAC) 20 MG/ML IV SOLN
INTRAVENOUS | Status: DC | PRN
  Administered 2015-03-04: 60 mg via INTRAVENOUS

## 2015-03-04 MED ORDER — METOCLOPRAMIDE HCL 5 MG/ML IJ SOLN: 5.0000 mg | Freq: Three times a day (TID) | INTRAMUSCULAR | Status: DC | PRN

## 2015-03-04 MED ORDER — METOCLOPRAMIDE HCL 10 MG PO TABS: 5.0000 mg | ORAL_TABLET | Freq: Three times a day (TID) | ORAL | Status: DC | PRN

## 2015-03-04 MED ORDER — ONDANSETRON HCL 4 MG/2ML IJ SOLN: 4.0000 mg | Freq: Four times a day (QID) | INTRAMUSCULAR | Status: DC | PRN

## 2015-03-04 MED ORDER — LORAZEPAM 2 MG/ML IJ SOLN
1.0000 mg | INTRAMUSCULAR | Status: DC | PRN
  Administered 2015-03-04 – 2015-03-05 (×3): 1 mg via INTRAVENOUS
  Filled 2015-03-04 (×3): qty 1

## 2015-03-04 MED ORDER — METHOCARBAMOL 500 MG PO TABS
500.0000 mg | ORAL_TABLET | Freq: Four times a day (QID) | ORAL | Status: DC | PRN
  Administered 2015-03-04 – 2015-03-05 (×3): 500 mg via ORAL
  Filled 2015-03-04 (×3): qty 1

## 2015-03-04 MED ORDER — FENTANYL CITRATE (PF) 250 MCG/5ML IJ SOLN
INTRAMUSCULAR | Status: AC
  Filled 2015-03-04: qty 5

## 2015-03-04 MED ORDER — HYDROMORPHONE HCL 1 MG/ML IJ SOLN
1.0000 mg | INTRAMUSCULAR | Status: DC | PRN
  Administered 2015-03-04 – 2015-03-05 (×3): 1 mg via INTRAVENOUS
  Filled 2015-03-04 (×3): qty 1

## 2015-03-04 MED ORDER — LACTATED RINGERS IV SOLN
INTRAVENOUS | Status: DC | PRN
Start: 2015-03-04 — End: 2015-03-04
  Administered 2015-03-04: 13:00:00 via INTRAVENOUS

## 2015-03-04 MED ORDER — ONDANSETRON HCL 4 MG/2ML IJ SOLN
INTRAMUSCULAR | Status: DC | PRN
  Administered 2015-03-04: 4 mg via INTRAVENOUS

## 2015-03-04 MED ORDER — HYDROMORPHONE HCL 1 MG/ML IJ SOLN
INTRAMUSCULAR | Status: AC
  Administered 2015-03-04: 0.5 mg via INTRAVENOUS
  Filled 2015-03-04: qty 1

## 2015-03-04 MED ORDER — ACETAMINOPHEN 325 MG PO TABS
650.0000 mg | ORAL_TABLET | Freq: Four times a day (QID) | ORAL | Status: DC | PRN
  Administered 2015-03-05 – 2015-03-07 (×2): 650 mg via ORAL
  Filled 2015-03-04 (×3): qty 2

## 2015-03-04 MED ORDER — LACTATED RINGERS IV SOLN: INTRAVENOUS | Status: DC

## 2015-03-04 MED ORDER — GABAPENTIN 300 MG PO CAPS
600.0000 mg | ORAL_CAPSULE | Freq: Three times a day (TID) | ORAL | Status: DC
  Administered 2015-03-04 – 2015-03-08 (×11): 600 mg via ORAL
  Filled 2015-03-04 (×11): qty 2

## 2015-03-04 MED ORDER — PROMETHAZINE HCL 25 MG/ML IJ SOLN: 6.2500 mg | INTRAMUSCULAR | Status: DC | PRN

## 2015-03-04 MED ORDER — ENOXAPARIN SODIUM 40 MG/0.4ML ~~LOC~~ SOLN
40.0000 mg | SUBCUTANEOUS | Status: DC
  Administered 2015-03-05 – 2015-03-08 (×4): 40 mg via SUBCUTANEOUS
  Filled 2015-03-04 (×4): qty 0.4

## 2015-03-04 MED ORDER — MIDAZOLAM HCL 2 MG/2ML IJ SOLN
INTRAMUSCULAR | Status: AC
  Administered 2015-03-04: 2 mg via INTRAVENOUS
  Filled 2015-03-04: qty 2

## 2015-03-04 MED ORDER — MIDAZOLAM HCL 2 MG/2ML IJ SOLN
INTRAMUSCULAR | Status: AC
  Filled 2015-03-04: qty 2

## 2015-03-04 SURGICAL SUPPLY — 38 items
BLADE SAW RECIP 87.9 MT (BLADE) ×3 IMPLANT
BLADE SURG 21 STRL SS (BLADE) ×3 IMPLANT
BNDG COHESIVE 6X5 TAN STRL LF (GAUZE/BANDAGES/DRESSINGS) ×4 IMPLANT
BNDG GAUZE ELAST 4 BULKY (GAUZE/BANDAGES/DRESSINGS) ×4 IMPLANT
COVER SURGICAL LIGHT HANDLE (MISCELLANEOUS) ×6 IMPLANT
CUFF TOURNIQUET SINGLE 34IN LL (TOURNIQUET CUFF) IMPLANT
CUFF TOURNIQUET SINGLE 44IN (TOURNIQUET CUFF) IMPLANT
DRAPE EXTREMITY T 121X128X90 (DRAPE) ×3 IMPLANT
DRAPE PROXIMA HALF (DRAPES) ×6 IMPLANT
DRAPE U-SHAPE 47X51 STRL (DRAPES) ×3 IMPLANT
DRSG ADAPTIC 3X8 NADH LF (GAUZE/BANDAGES/DRESSINGS) ×3 IMPLANT
DRSG PAD ABDOMINAL 8X10 ST (GAUZE/BANDAGES/DRESSINGS) ×3 IMPLANT
DURAPREP 26ML APPLICATOR (WOUND CARE) ×3 IMPLANT
ELECT REM PT RETURN 9FT ADLT (ELECTROSURGICAL) ×3
ELECTRODE REM PT RTRN 9FT ADLT (ELECTROSURGICAL) ×1 IMPLANT
GAUZE SPONGE 4X4 12PLY STRL (GAUZE/BANDAGES/DRESSINGS) ×3 IMPLANT
GLOVE BIOGEL PI IND STRL 9 (GLOVE) ×1 IMPLANT
GLOVE BIOGEL PI INDICATOR 9 (GLOVE) ×2
GLOVE SURG ORTHO 9.0 STRL STRW (GLOVE) ×3 IMPLANT
GOWN STRL REUS W/ TWL XL LVL3 (GOWN DISPOSABLE) ×2 IMPLANT
GOWN STRL REUS W/TWL XL LVL3 (GOWN DISPOSABLE) ×6
KIT BASIN OR (CUSTOM PROCEDURE TRAY) ×3 IMPLANT
KIT ROOM TURNOVER OR (KITS) ×3 IMPLANT
MANIFOLD NEPTUNE II (INSTRUMENTS) ×3 IMPLANT
NS IRRIG 1000ML POUR BTL (IV SOLUTION) ×3 IMPLANT
PACK GENERAL/GYN (CUSTOM PROCEDURE TRAY) ×3 IMPLANT
PAD ARMBOARD 7.5X6 YLW CONV (MISCELLANEOUS) ×6 IMPLANT
SPONGE GAUZE 4X4 12PLY STER LF (GAUZE/BANDAGES/DRESSINGS) ×2 IMPLANT
SPONGE LAP 18X18 X RAY DECT (DISPOSABLE) IMPLANT
STAPLER VISISTAT 35W (STAPLE) IMPLANT
STOCKINETTE IMPERVIOUS LG (DRAPES) ×3 IMPLANT
SUT ETHILON 2 0 PSLX (SUTURE) ×2 IMPLANT
SUT SILK 2 0 (SUTURE) ×3
SUT SILK 2-0 18XBRD TIE 12 (SUTURE) ×1 IMPLANT
SUT VIC AB 1 CTX 27 (SUTURE) IMPLANT
TOWEL OR 17X24 6PK STRL BLUE (TOWEL DISPOSABLE) ×3 IMPLANT
TOWEL OR 17X26 10 PK STRL BLUE (TOWEL DISPOSABLE) ×3 IMPLANT
WATER STERILE IRR 1000ML POUR (IV SOLUTION) ×3 IMPLANT

## 2015-03-04 NOTE — Anesthesia Postprocedure Evaluation (Signed)
Anesthesia Post Note  Patient: Monique [name]  Procedure(s) Performed: Procedure(s) (LRB): Right Below Knee Amputation (Right) REMOVAL EXTERNAL FIXATION Right Lower Leg (Right)  Patient location during evaluation: PACU Anesthesia Type: General Level of consciousness: awake and alert Pain management: pain level controlled Vital Signs Assessment: post-procedure vital signs reviewed and stable Respiratory status: spontaneous breathing, nonlabored ventilation, respiratory function stable and patient connected to nasal cannula oxygen Cardiovascular status: blood pressure returned to baseline and stable Postop Assessment: no signs of nausea or vomiting Anesthetic complications: no    Last Vitals:  Filed Vitals:   03/04/15 1415 03/04/15 1532  BP:  145/86  Pulse: 111 122  Temp:  36.9 C  Resp:  20    Last Pain:  Filed Vitals:   03/04/15 1635  PainSc: 9                  Yaseen Gilberg,W. EDMOND

## 2015-03-04 NOTE — Transfer of Care (Signed)
Immediate Anesthesia Transfer of Care Note  Patient: Monique [name]  Procedure(s) Performed: Procedure(s): Right Below Knee Amputation (Right) REMOVAL EXTERNAL FIXATION Right Lower Leg (Right)  Patient Location: PACU  Anesthesia Type:General  Level of Consciousness: awake and alert   Airway & Oxygen Therapy: Patient Spontanous Breathing and Patient connected to nasal cannula oxygen  Post-op Assessment: Report given to RN and Post -op Vital signs reviewed and stable  Post vital signs: Reviewed and stable  Last Vitals:  Filed Vitals:   03/04/15 1409 03/04/15 1415  BP: 173/103   Pulse: 122 111  Temp:    Resp: 19     Complications: No apparent anesthesia complications

## 2015-03-04 NOTE — Progress Notes (Signed)
In to monitor pt,Quiet w/ eyes closed,pulse ox on RA 86-89% resp 10-12 placed on 2L [place]. Opened her eyes and requested pain med.I informed her based on my assessment i cannot give any narcotics at this time.began to scream louder.was offered other pain relieving methods but continued to yell out.Skin warm and dry.RLE on pillow and FOB elevated.

## 2015-03-04 NOTE — Anesthesia Preprocedure Evaluation (Addendum)
Anesthesia Evaluation  Patient identified by MRN, date of birth, ID band Patient awake    Reviewed: Allergy & Precautions, NPO status , Patient's Chart, lab work & pertinent test results  History of Anesthesia Complications Negative for: history of anesthetic complications  Airway Mallampati: II  TM Distance: >3 FB Neck ROM: Full    Dental  (+) Poor Dentition, Chipped, Missing, Dental Advisory Given   Pulmonary COPD, Current Smoker,    breath sounds clear to auscultation       Cardiovascular negative cardio ROS   Rhythm:Regular Rate:Normal     Neuro/Psych Anxiety Depression Bipolar Disorder negative neurological ROS     GI/Hepatic GERD  Controlled,(+)     substance abuse  cocaine use and marijuana use,   Endo/Other  Morbid obesity  Renal/GU negative Renal ROS     Musculoskeletal   Abdominal (+) + obese,   Peds  Hematology  (+) Blood dyscrasia (Hb 8.8), ,   Anesthesia Other Findings   Reproductive/Obstetrics BTL                            Anesthesia Physical Anesthesia Plan  ASA: III  Anesthesia Plan: General   Post-op Pain Management:    Induction: Intravenous  Airway Management Planned: LMA  Additional Equipment:   Intra-op Plan:   Post-operative Plan:   Informed Consent: I have reviewed the patients History and Physical, chart, labs and discussed the procedure including the risks, benefits and alternatives for the proposed anesthesia with the patient or authorized representative who has indicated his/her understanding and acceptance.   Dental advisory given  Plan Discussed with: CRNA and Surgeon  Anesthesia Plan Comments: (Plan routine monitors, GA- LMA OK)        Anesthesia Quick Evaluation

## 2015-03-04 NOTE — Op Note (Signed)
   Date of Surgery: 03/04/2015  INDICATIONS: Ms. Monique [name] is a 40 y.o.-year-old female who has undergone limb salvage intervention for the left lower extremity who presents with persistent osteomyelitis abscess and ulceration of the right ankle.  PREOPERATIVE DIAGNOSIS: Abscess ulceration osteomyelitis right ankle with retained external fixator  POSTOPERATIVE DIAGNOSIS: Same.  PROCEDURE: Transtibial amputation on the right Removal of external fixator  SURGEON: Lajoyce Cornersuda, M.D.  ANESTHESIA:  general  IV FLUIDS AND URINE: See anesthesia.  ESTIMATED BLOOD LOSS: min mL.  COMPLICATIONS: None.  DESCRIPTION OF PROCEDURE: The patient was brought to the operating room and underwent a general anesthetic. After adequate levels of anesthesia were obtained patient's lower extremity was prepped using DuraPrep draped into a sterile field. A timeout was called.   The external fixator was removed from the right lower extremity. The leg was then again prepped using DuraPrep draped into a sterile field  A transverse incision was made 11 cm distal to the tibial tubercle. This curved proximally and a large posterior flap was created. The tibia was transected 1 cm proximal to the skin incision. The fibula was transected just proximal to the tibial incision. The tibia was beveled anteriorly. A large posterior flap was created. The sciatic nerve was pulled cut and allowed to retract. The vascular bundles were suture ligated with 2-0 silk. The deep and superficial fascial layers were closed using #1 Vicryl. The skin was closed using staples and 2-0 nylon. The wound was covered with Adaptic orthopedic sponges AB dressing Kerlix and Coban. Patient was extubated taken to the PACU in stable condition.  Aldean BakerMarcus Duda, MD [facility] 1:52 PM

## 2015-03-04 NOTE — Progress Notes (Signed)
[facility] PROGRESS NOTE  Monique [name] VWU:[id number] DOB: 04-21-1975 DOA: 03/02/2015 PCP: [name] Patient  Length of stay: 2   Assessment/Plan: Principal Problem:   Osteomyelitis (HCC) Active Problems:   Cellulitis   Acute osteomyelitis of right foot (HCC)   Brief summary 40 y.o. 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 The patient reports about a week of increased right ankle pain, redness, swelling, and drainage. The pain is located on the lateral right ankle and heel, is aching and sharp, and constant, worse with movement. She has had chills, emesis.  The patient states that she broke her ankle in August 2013, had a surgical infection immediately after requiring PICC, then was discharged from the care of her orthopedist because of inability to pay so that she had to "go to the ER every month for a year for [oral] antibiotics", until she started coming to see Dr. Lajoyce [name] here, who operated on her ankle in August 2016 and then November 2016.   Patient suffered a right trimalleolar fracture and syndesmotic rupture in 10/2013 at [facility], which was complicated by premature weightbearing/non-compliance and then cellulitis treated with 6 weeks IV antibiotics by PICC. She subsequently no-showed her orthopedics appointments, was treated several times with oral antibiotics from jail for pain/redness/swelling/drainage of her ankle, was discharged from ortho clinic for a time for no-show appointments, and was last seen last May at which time Dr. Samuel Bouche recommended surgery to remove plate/screws followed by IV antibiotics. The patient agreed to surgery, but never followed up. Subsequently, she was found altered and admitted to [facility] with drug overdose.  Dr. Lajoyce [name] removed her ankle hardware at that time, and she was treated with oral  IV antibiotics for 12 weeks, which she did not take and was admitted again a month later for osteomyelitis, this time discharged to SNF with PICC, which she appears to have completed. She underwent a fusion and antibiotic bead placement in Nov. 2016. Patient evaluated by Dr. Lajoyce [name] on 03/02/15 and schedule for trans-tibial amputation on Friday afternoon.  Assessment and plan  1. Cellulitis with progressive osteomyelitis of the right ankle:  Has a history of MSSA and remote history of MRSA. Chronic osteomyelitis during the last year. There is no WBC, tachycardia, signs of systemic infection. Lactatemia transient. Sepsis syndrome doubted. Lactic acid 2.13> 0.74, CRP 1.7, HIV nonreactive Dr. Lajoyce [name] to perform transtibial amputation today, patient to OR -Continue Cefazolin 1g q8hrs IV Follow-up tissue culture, anticipate will be able to discontinue antibiotics after surgery  2. Chronic pain:  Patient is not able to consistently afford and take bupropion or amitriptyline. -Continue home oxycodone-acetaminophen and gabapentin  3. Chronic anxiety: -Continue home alprazolam  4. Hypokalemia: -Supplement once. -Check magnesium  5. Anemia of chronic disease: Stable. Presumed from chronic infection.     DVT prophylaxsis   Code Status:      Code Status Orders        Start     Ordered   03/02/15 0416  Full code   Continuous     03/02/15 0415      Family Communication: Discussed in detail with the patient, all imaging results, lab results explained to the patient   Disposition Plan: Transtibial amputation on Friday, SNF vs cir?     Consultants:  Orthopedics  Procedures:  None  Antibiotics: Anti-infectives  Start     Dose/Rate Route Frequency Ordered Stop   03/03/15 0300  [MAR Hold]  ceFAZolin (ANCEF) IVPB 1 g/50 mL premix     (MAR Hold since 03/04/15 1133)   1 g 100 mL/hr over 30 Minutes Intravenous 3 times per day 03/02/15 0415     03/02/15 0230  vancomycin  (VANCOCIN) 1,500 mg in sodium chloride 0.9 % 500 mL IVPB  Status:  Discontinued     1,500 mg 250 mL/hr over 120 Minutes Intravenous  Once 03/02/15 0225 03/02/15 0415   03/02/15 0230  cefTRIAXone (ROCEPHIN) 2 g in dextrose 5 % 50 mL IVPB     2 g 100 mL/hr over 30 Minutes Intravenous  Once 03/02/15 0225 03/02/15 0334         HPI/Subjective: Stable overnight, pain better controlled today than yesterday  Objective: Filed Vitals:   03/03/15 0628 03/03/15 1356 03/03/15 2257 03/04/15 0559  BP: 122/72 126/71 125/63 117/69  Pulse: 85 94 80 83  Temp: 98.5 F (36.9 C)  97.7 F (36.5 C) 97.8 F (36.6 C)  TempSrc: Oral  Oral Oral  Resp: 18 20 18 18   Height:      Weight:      SpO2: 98% 98% 99% 100%    Intake/Output Summary (Last 24 hours) at 03/04/15 1321 Last data filed at 03/04/15 1610  Gross per 24 hour  Intake    480 ml  Output   1150 ml  Net   -670 ml    Exam:  General: No acute respiratory distress Lungs: Clear to auscultation bilaterally without wheezes or crackles Cardiovascular: Regular rate and rhythm without murmur gallop or rub normal S1 and S2 Abdomen: Nontender, nondistended, soft, bowel sounds positive, no rebound, no ascites, no appreciable mass On examination patient has increased redness and swelling and pain around the right ankle. The proximal pin tracks showed no redness no cellulitis no drainage distal pin tracks show no redness no cellulitis no drainage. Patient is exquisitely tender to light touch. Review of the radiographs shows destruction of the talar dome consistent with osteomyelitis there is also destructive lytic changes of the distal tibia     Data Review   Micro Results No results found for this or any previous visit (from the past 240 hour(s)).  Radiology Reports Dg Ankle Complete Right  03/02/2015  CLINICAL DATA:  Possible postop infection, ankle surgery last November. Diffuse foot and ankle pain. EXAM: RIGHT ANKLE - COMPLETE 3+ VIEW  COMPARISON:  Most recent radiographs 02/16/2015 FINDINGS: External fixator in place. Postsurgical change about the ankle with distal fibular resection, single fibular screw remains. Distal tibial resection with unchanged alignment of the tibial talar articulation, mild apex medial talar tilt. Probable decreased dense in the talar dome from prior exam, however diffuse bony under mineralization is seen. Fragmentation of the medial malleolus remains. Progressive soft tissue edema from prior. IMPRESSION: 1. Post distal tibial resection and tibial talar fusion with increasing lucency about the talar dome, as well as increased soft tissue edema, concerning for soft tissue infection and osteomyelitis. Distal fibular resection without complication. 2. External fixator remains in place. Electronically Signed   By: Rubye Oaks M.D.   On: 03/02/2015 01:00   Dg Ankle Complete Right  02/16/2015  CLINICAL DATA:  Pain at operative site, status post external fixation one month ago EXAM: RIGHT ANKLE - COMPLETE 3+ VIEW COMPARISON:  02/06/2015 FINDINGS: Three views of the right ankle submitted. Again noted postsurgical changes in distal fibula with partial resection.  Stable postsurgical changes question resection distal tibia. Again noted external fixation material with 2 proximal screws in tibial shaft. A transverse screw is noted passing through calcaneus. There is no significant change in alignment from prior exam. Diffuse soft tissue swelling around the ankle without change from prior exam. Stable bony fragments just medial to distal tibia and anterior to distal tibia seen on lateral view. Lateral view shows irregularity and erosion superior articular surface of the talus. Osteomyelitis cannot be excluded. Clinical correlation is necessary. IMPRESSION: Again noted postsurgical changes in distal fibula with partial resection. Stable postsurgical changes question resection distal tibia. Again noted external fixation  material with 2 proximal screws in tibial shaft. A transverse screw is noted passing through calcaneus. There is no significant change in alignment from prior exam. Diffuse soft tissue swelling around the ankle without change from prior exam. Stable bony fragments just medial to distal tibia and anterior to distal tibia seen on lateral view. Lateral view shows irregularity and erosion superior articular surface of the talus. Osteomyelitis cannot be excluded. Clinical correlation is necessary. Electronically Signed   By: Natasha Mead M.D.   On: 02/16/2015 19:46   Dg Ankle Complete Right  02/06/2015  CLINICAL DATA:  Right ankle pain after falling. EXAM: RIGHT ANKLE - COMPLETE 3+ VIEW COMPARISON:  December 31, 2014. FINDINGS: Status post internal fixation of of right lower leg, with proximal screws seen in the mid tibial shaft, and distal screw passing through calcaneus. Severely comminuted fracture of the distal right tibia is noted. There has been surgical resection of the distal fibula. Old healed fracture of distal right fibula is noted. IMPRESSION: Status post internal fixation of severely comminuted distal right tibial fracture. Status post surgical resection of distal fibula. Electronically Signed   By: Lupita Raider, M.D.   On: 02/06/2015 15:25   Dg Foot Complete Right  03/02/2015  CLINICAL DATA:  Diffuse foot and ankle pain, possible postop infection. Surgery last November. EXAM: RIGHT FOOT COMPLETE - 3+ VIEW COMPARISON:  No prior dedicated foot exams. FINDINGS: Ankle findings described with concurrently performed ankle radiographs. Diffuse bony under mineralization. Soft tissue edema about the dorsum of the foot. No bony destructive change involving the foot. IMPRESSION: Soft tissue edema about the foot.  No acute bony abnormality. Ankle findings described on concurrently performed ankle radiographs. Electronically Signed   By: Rubye Oaks M.D.   On: 03/02/2015 01:02     CBC  Recent Labs Lab  03/01/15 2126 03/02/15 0655 03/03/15 0630 03/04/15 0755  WBC 7.5 6.9 4.5 4.4  HGB 9.9* 9.9* 9.0* 8.8*  HCT 32.6* 33.2* 30.5* 29.8*  PLT 468* 421* 345 335  MCV 79.7 80.4 81.3 81.2  MCH 24.2* 24.0* 24.0* 24.0*  MCHC 30.4 29.8* 29.5* 29.5*  RDW 17.2* 17.2* 17.4* 17.1*    Chemistries   Recent Labs Lab 03/01/15 2126 03/02/15 0655 03/03/15 0630 03/04/15 0755  NA 137 137 135 135  K 3.3* 3.5 3.9 4.3  CL 106 107 103 98*  CO2 20* 22 24 29   GLUCOSE 107* 140* 152* 108*  BUN 14 12 10 10   CREATININE 0.55 0.51 0.44 0.56  CALCIUM 9.2 8.7* 8.9 9.1  MG  --  1.8  --   --   AST 21  --   --  26  ALT 20  --   --  25  ALKPHOS 64  --   --  56  BILITOT 0.3  --   --  0.2*   ------------------------------------------------------------------------------------------------------------------  estimated creatinine clearance is 101.6 mL/min (by C-G formula based on Cr of 0.56). ------------------------------------------------------------------------------------------------------------------  Recent Labs  03/02/15 0655  HGBA1C 5.6   ------------------------------------------------------------------------------------------------------------------ No results for input(s): CHOL, HDL, LDLCALC, TRIG, CHOLHDL, LDLDIRECT in the last 72 hours. ------------------------------------------------------------------------------------------------------------------ No results for input(s): TSH, T4TOTAL, T3FREE, THYROIDAB in the last 72 hours.  Invalid input(s): FREET3 ------------------------------------------------------------------------------------------------------------------ No results for input(s): VITAMINB12, FOLATE, FERRITIN, TIBC, IRON, RETICCTPCT in the last 72 hours.  Coagulation profile No results for input(s): INR, PROTIME in the last 168 hours.  No results for input(s): DDIMER in the last 72 hours.  Cardiac Enzymes No results for input(s): CKMB, TROPONINI, MYOGLOBIN in the last 168  hours.  Invalid input(s): CK ------------------------------------------------------------------------------------------------------------------ Invalid input(s): POCBNP   CBG: No results for input(s): GLUCAP in the last 168 hours.     Studies: No results found.    Lab Results  Component Value Date   HGBA1C 5.6 03/02/2015   Lab Results  Component Value Date   [facility]  12/10/2007    87        Total Cholesterol/HDL:CHD Risk Coronary Heart Disease Risk Table                     Men   Women  1/2 Average Risk   3.4   3.3   CREATININE 0.56 03/04/2015       Scheduled Meds: . [MAR Hold] ALPRAZolam  0.5 mg Oral BID  . [MAR Hold]  ceFAZolin (ANCEF) IV  1 g Intravenous 3 times per day  . [MAR Hold] enoxaparin (LOVENOX) injection  40 mg Subcutaneous Q24H  . [MAR Hold] gabapentin  300 mg Oral TID  . [MAR Hold] oxyCODONE  10 mg Oral Q12H   Continuous Infusions: . sodium chloride 75 mL/hr at 03/03/15 2216  . lactated ringers      Principal Problem:   Osteomyelitis (HCC) Active Problems:   Cellulitis   Acute osteomyelitis of right foot (HCC)    Time spent: 45 minutes   [facility] HospitalBROL,Cannon Quinton  [facility] Pager [phone]. If 7PM-7AM, please contact night-coverage at [email or web address], password [email or web address] 03/04/2015, 1:21 PM  LOS: 2 days

## 2015-03-04 NOTE — Interval H&P Note (Signed)
History and Physical Interval Note:  03/04/2015 6:32 AM  Monique [name]  has presented today for surgery, with the diagnosis of Osteomyelitis Right Ankle  The various methods of treatment have been discussed with the patient and family. After consideration of risks, benefits and other options for treatment, the patient has consented to  Procedure(s): Right Below Knee Amputation (Right) as a surgical intervention .  The patient's history has been reviewed, patient examined, no change in status, stable for surgery.  I have reviewed the patient's chart and labs.  Questions were answered to the patient's satisfaction.     Dwon Sky V

## 2015-03-04 NOTE — H&P (View-Only) (Signed)
Reason for Consult: Osteomyelitis cellulitis right ankle Referring Physician: Dr. Krishnan  Monique [name] is an 39 y.o. female.  HPI: Patient is a 39-year-old woman who is status post limb salvage for infection of initial right ankle fracture. Patient is undergone limb salvage and currently has an external fixator with previous placement of antibiotic beads and resection of the infected bone right ankle. Patient presents at this time with acute pain redness swelling around the right ankle. Patient presented to the emergency room last night. I recommended that she follow-up in my office yesterday  but she stated that she could not get a ride.  Past Medical History  Diagnosis Date  . Bipolar disorder (HCC)   . 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 GERD (gastroesophageal reflux disease)   . Nerve damage of right foot   . Arthritis   . MRSA infection   . Migraine     "weekly" (12/17/2014)  . Chronic back pain     "middle and lower" (12/17/2014)  . Anxiety   . Depression   . Infection right ankle  . Asthma     PHM    Past Surgical History  Procedure Laterality Date  . Tubal ligation    . Hardware removal Right 09/02/2014    Procedure: Removal Right Lateral Ankle Hardware, Place Antibiotic Bead ;  Surgeon:   V, MD;  Location: [facility] ORS;  Service: Orthopedics;  Laterality: Right;  . Ankle fracture surgery Right 82016  . Fracture surgery    . Dilation and curettage of uterus  X 2  . Incision and drainage abscess Right 12/19/2014    Procedure: INCISION AND DRAINAGE OF ABSCESS ON RIGHT SIDE OF CHIN;  Surgeon: Dwight Bates, MD;  Location: [facility] OR;  Service: ENT;  Laterality: Right;  . Ankle fusion Right 01/19/2015    Procedure: Right Tibiotalar Fusion, Place Antibiotic Beads;  Surgeon:   V, MD;  Location: [facility] OR;  Service: Orthopedics;  Laterality: Right;  . External fixation leg Right 01/19/2015    Procedure: EXTERNAL FIXATION Right Ankle;  Surgeon:   V,  MD;  Location: [facility] OR;  Service: Orthopedics;  Laterality: Right;    Family History  Problem Relation Age of Onset  . Lung cancer Mother   . Cancer Maternal Uncle   . Cancer Maternal Grandfather   . Cancer Paternal Grandfather     Social History:  reports that she has been smoking Cigarettes.  She has a 12.5 pack-year smoking history. She has never used smokeless tobacco. She reports that she drinks alcohol. She reports that she uses illicit drugs (Cocaine, Marijuana, and Benzodiazepines).  Allergies: No Known Allergies  Medications: I have reviewed the patient's current medications.  Results for orders placed or performed during the hospital encounter of 03/02/15 (from the past 48 hour(s))  Comprehensive metabolic panel     Status: Abnormal   Collection Time: 03/01/15  9:26 PM  Result Value Ref Range   Sodium 137 135 - 145 mmol/L   Potassium 3.3 (L) 3.5 - 5.1 mmol/L   Chloride 106 101 - 111 mmol/L   CO2 20 (L) 22 - 32 mmol/L   Glucose, Bld 107 (H) 65 - 99 mg/dL   BUN 14 6 - 20 mg/dL   Creatinine, Ser 0.55 0.44 - 1.00 mg/dL   Calcium 9.2 8.9 - 10.3 mg/dL   Total Protein 8.1 6.5 - 8.1 g/dL   Albumin 3.2 (L) 3.5 - 5.0 g/dL   AST 21 15 -   41 U/L   ALT 20 14 - 54 U/L   Alkaline Phosphatase 64 38 - 126 U/L   Total Bilirubin 0.3 0.3 - 1.2 mg/dL   GFR calc non Af Amer >60 >60 mL/min   GFR calc Af Amer >60 >60 mL/min    Comment: (NOTE) The eGFR has been calculated using the CKD EPI equation. This calculation has not been validated in all clinical situations. eGFR's persistently <60 mL/min signify possible Chronic Kidney Disease.    Anion gap 11 5 - 15  CBC     Status: Abnormal   Collection Time: 03/01/15  9:26 PM  Result Value Ref Range   WBC 7.5 4.0 - 10.5 K/uL   RBC 4.09 3.87 - 5.11 MIL/uL   Hemoglobin 9.9 (L) 12.0 - 15.0 g/dL   HCT 32.6 (L) 36.0 - 46.0 %   MCV 79.7 78.0 - 100.0 fL   MCH 24.2 (L) 26.0 - 34.0 pg   MCHC 30.4 30.0 - 36.0 g/dL   RDW 17.2 (H) 11.5 - 15.5 %    Platelets 468 (H) 150 - 400 K/uL  I-Stat CG4 Lactic Acid, ED  (not at [facility])     Status: Abnormal   Collection Time: 03/01/15  9:38 PM  Result Value Ref Range   Lactic Acid, Venous 2.13 (HH) 0.5 - 2.0 mmol/L   Comment NOTIFIED PHYSICIAN   I-Stat CG4 Lactic Acid, ED  (not at [facility])     Status: None   Collection Time: 03/02/15 12:19 AM  Result Value Ref Range   Lactic Acid, Venous 0.74 0.5 - 2.0 mmol/L  CBC     Status: Abnormal   Collection Time: 03/02/15  6:55 AM  Result Value Ref Range   WBC 6.9 4.0 - 10.5 K/uL   RBC 4.13 3.87 - 5.11 MIL/uL   Hemoglobin 9.9 (L) 12.0 - 15.0 g/dL   HCT 33.2 (L) 36.0 - 46.0 %   MCV 80.4 78.0 - 100.0 fL   MCH 24.0 (L) 26.0 - 34.0 pg   MCHC 29.8 (L) 30.0 - 36.0 g/dL   RDW 17.2 (H) 11.5 - 15.5 %   Platelets 421 (H) 150 - 400 K/uL    Dg Ankle Complete Right  03/02/2015  CLINICAL DATA:  Possible postop infection, ankle surgery last November. Diffuse foot and ankle pain. EXAM: RIGHT ANKLE - COMPLETE 3+ VIEW COMPARISON:  Most recent radiographs 02/16/2015 FINDINGS: External fixator in place. Postsurgical change about the ankle with distal fibular resection, single fibular screw remains. Distal tibial resection with unchanged alignment of the tibial talar articulation, mild apex medial talar tilt. Probable decreased dense in the talar dome from prior exam, however diffuse bony under mineralization is seen. Fragmentation of the medial malleolus remains. Progressive soft tissue edema from prior. IMPRESSION: 1. Post distal tibial resection and tibial talar fusion with increasing lucency about the talar dome, as well as increased soft tissue edema, concerning for soft tissue infection and osteomyelitis. Distal fibular resection without complication. 2. External fixator remains in place. Electronically Signed   By: Jeb Levering M.D.   On: 03/02/2015 01:00   Dg Foot Complete Right  03/02/2015  CLINICAL DATA:  Diffuse foot and ankle pain, possible postop infection. Surgery  last November. EXAM: RIGHT FOOT COMPLETE - 3+ VIEW COMPARISON:  No prior dedicated foot exams. FINDINGS: Ankle findings described with concurrently performed ankle radiographs. Diffuse bony under mineralization. Soft tissue edema about the dorsum of the foot. No bony destructive change involving the foot. IMPRESSION: Soft tissue edema  about the foot.  No acute bony abnormality. Ankle findings described on concurrently performed ankle radiographs. Electronically Signed   By: Melanie  Ehinger M.D.   On: 03/02/2015 01:02    Review of Systems  All other systems reviewed and are negative.  Blood pressure 125/67, pulse 88, temperature 98.3 F (36.8 C), temperature source Oral, resp. rate 16, last menstrual period 02/16/2015, SpO2 97 %. Physical Exam On examination patient has increased redness and swelling and pain around the right ankle. The proximal pin tracks showed no redness no cellulitis no drainage distal pin tracks show no redness no cellulitis no drainage. Patient is exquisitely tender to light touch. Review of the radiographs shows destruction of the talar dome consistent with osteomyelitis there is also destructive lytic changes of the distal tibia Assessment/Plan: Assessment: Progressive osteomyelitis right ankle.  Plan: Discussed with the patient due to failure of limb salvage we would need to proceed with a transtibial amputation. Risks and benefits were discussed including persistent pain. Patient states she understands wishes to proceed with surgery. We will have her stay on the IV antibiotics to suppress the infection. We'll plan for transtibial amputation on Friday afternoon.  , V 03/02/2015, 7:45 AM      

## 2015-03-04 NOTE — Anesthesia Procedure Notes (Signed)
**Note De-[name] via Obfuscation** Procedure Name: LMA Insertion Date/Time: 03/04/2015 1:17 PM [name] by: Sarita HaverFLOWERS, Monique [name], Monique [name], Monique [name], Monique [name] Patient Re-evaluated:Patient Re-evaluated prior to inductionOxygen Delivery Method: Circle system utilized and Simple face mask Preoxygenation: Pre-oxygenation with 100% oxygen Intubation Type: IV induction Ventilation: Mask ventilation without difficulty LMA: LMA inserted LMA Size: 4.0 Number of attempts: 1 Airway Equipment and Method: Patient positioned with wedge pillow Placement Confirmation: positive ETCO2 and breath sounds checked- equal and bilateral Tube secured with: Tape Dental Injury: Teeth and Oropharynx as per pre-operative assessment

## 2015-03-05 LAB — COMPREHENSIVE METABOLIC PANEL
ALBUMIN: 3.1 g/dL — AB (ref 3.5–5.0)
ALK PHOS: 70 U/L (ref 38–126)
ALT: 29 U/L (ref 14–54)
ANION GAP: 8 (ref 5–15)
AST: 45 U/L — ABNORMAL HIGH (ref 15–41)
BUN: 6 mg/dL (ref 6–20)
CO2: 29 mmol/L (ref 22–32)
Calcium: 8.8 mg/dL — ABNORMAL LOW (ref 8.9–10.3)
Chloride: 96 mmol/L — ABNORMAL LOW (ref 101–111)
Creatinine, Ser: 0.56 mg/dL (ref 0.44–1.00)
GFR calc Af Amer: >60 mL/min (ref >60)
GFR calc non Af Amer: >60 mL/min (ref >60)
GLUCOSE: 123 mg/dL — AB (ref 65–99)
POTASSIUM: 4.7 mmol/L (ref 3.5–5.1)
SODIUM: 133 mmol/L — AB (ref 135–145)
Total Bilirubin: 1.2 mg/dL (ref 0.3–1.2)
Total Protein: 8 g/dL (ref 6.5–8.1)

## 2015-03-05 LAB — CBC
HCT: 30.9 % — ABNORMAL LOW (ref 36.0–46.0)
HEMOGLOBIN: 9.1 g/dL — AB (ref 12.0–15.0)
MCH: 23.4 pg — ABNORMAL LOW (ref 26.0–34.0)
MCHC: 29.4 g/dL — AB (ref 30.0–36.0)
MCV: 79.4 fL (ref 78.0–100.0)
Platelets: 422 10*3/uL — ABNORMAL HIGH (ref 150–400)
RBC: 3.89 MIL/uL (ref 3.87–5.11)
RDW: 16.8 % — AB (ref 11.5–15.5)
WBC: 7.5 10*3/uL (ref 4.0–10.5)

## 2015-03-05 MED ORDER — HYDROMORPHONE HCL 1 MG/ML IJ SOLN
2.0000 mg | INTRAMUSCULAR | Status: DC | PRN
  Administered 2015-03-05 – 2015-03-08 (×21): 2 mg via INTRAVENOUS
  Filled 2015-03-05 (×23): qty 2

## 2015-03-05 MED ORDER — LORAZEPAM 2 MG/ML IJ SOLN
1.0000 mg | Freq: Four times a day (QID) | INTRAMUSCULAR | Status: DC | PRN
  Administered 2015-03-05 – 2015-03-08 (×9): 1 mg via INTRAVENOUS
  Filled 2015-03-05 (×8): qty 1

## 2015-03-05 MED ORDER — OXYCODONE HCL 5 MG PO TABS
10.0000 mg | ORAL_TABLET | ORAL | Status: DC | PRN
  Administered 2015-03-05 – 2015-03-08 (×13): 10 mg via ORAL
  Filled 2015-03-05 (×14): qty 2

## 2015-03-05 MED ORDER — OXYCODONE HCL ER 10 MG PO T12A
20.0000 mg | EXTENDED_RELEASE_TABLET | Freq: Two times a day (BID) | ORAL | Status: AC
  Administered 2015-03-05 – 2015-03-06 (×3): 20 mg via ORAL
  Filled 2015-03-05 (×3): qty 2

## 2015-03-05 NOTE — Progress Notes (Signed)
Patient ID: Monique AgeeKimberly E XXXFlinchum, female   DOB: 04/27/1975, 40 y.o.   MRN: [id number] Left BKA stump dressing clean and intact.  I did loosen it at the top for her.  Otherwise, no acute changes.  Will need therapy for mobility.

## 2015-03-05 NOTE — Progress Notes (Addendum)
[facility] PROGRESS NOTE  Monique AgeeKimberly E XXXFlinchum MWU:[id number]:[id number] DOB: 12/04/1975 DOA: 03/02/2015 PCP: [name] Patient  Length of stay: 3   Assessment/Plan: Principal Problem:   Osteomyelitis (HCC) Active Problems:   Cellulitis   Acute osteomyelitis of right foot (HCC)   Brief summary 40 y.o. 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 The patient reports about a week of increased right ankle pain, redness, swelling, and drainage. The pain is located on the lateral right ankle and heel, is aching and sharp, and constant, worse with movement. She has had chills, emesis.  The patient states that she broke her ankle in August 2013, had a surgical infection immediately after requiring PICC, then was discharged from the care of her orthopedist because of inability to pay so that she had to "go to the ER every month for a year for [oral] antibiotics", until she started coming to see Dr. Lajoyce Cornersuda here, who operated on her ankle in August 2016 and then November 2016.   Patient suffered a right trimalleolar fracture and syndesmotic rupture in 10/2013 at [facility], which was complicated by premature weightbearing/non-compliance and then cellulitis treated with 6 weeks IV antibiotics by PICC. She subsequently no-showed her orthopedics appointments, was treated several times with [facility] antibiotics from jail for pain/redness/swelling/drainage of her ankle, was discharged from ortho clinic for a time for no-show appointments, and was last seen last May at which time Dr. Samuel BoucheLucas recommended surgery to remove plate/screws followed by IV antibiotics. The patient agreed to surgery, but never followed up. Subsequently, she was found altered and admitted to [facility] with [facility] overdose.  Dr. Lajoyce Cornersuda removed her ankle hardware at that time, and she was treated with oral  IV antibiotics for 12 weeks, which she did not take and was admitted again a month later for osteomyelitis, this time discharged to SNF with PICC, which she appears to have completed. She underwent a fusion and antibiotic bead placement in Nov. 2016. Patient evaluated by Dr. 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 Cellulitis with progressive osteomyelitis of the right ankle:  Has a history of MSSA and remote history of MRSA. Chronic osteomyelitis during the last year. There is no WBC, tachycardia, signs of systemic infection. Lactatemia transient. Sepsis syndrome doubted. Lactic acid 2.13> 0.74, CRP 1.7, HIV nonreactive Dr. Lajoyce Cornersuda performed transtibial amputation 1/6 -Continue Cefazolin 1g q8hrs IV Continue to follow tissue culture, anticipate will be able to discontinue antibiotics after surgery?  2. Chronic pain:  Takes bupropion or amitriptyline at home. -Percocet change to oxycodone 10 mg every 4 hours prn, and gabapentin Also started on OxyContin during this admission for uncontrolled pain, increase OxyContin to 20 mg every 12 hours, Dilaudid 2 mg IV q 3 hrs Written instructions about her current pain regimen given to the patient upon request   3. Chronic anxiety: -Continue home alprazolam  4. Hypokalemia: Repleted  5. Anemia of chronic disease: Stable. Presumed from chronic infection. Hemoglobin stable     DVT prophylaxsis Lovenox  Code Status:      Code Status Orders        Start     Ordered   03/02/15 0416  Full code   Continuous     03/02/15 0415      Family Communication: Discussed in detail with the patient, all  imaging results, lab results explained to the patient   Disposition Plan: Transtibial amputation on Friday, SNF vs cir?     Consultants:  Orthopedics  Procedures: Transtibial amputation on the right Removal of external fixator  Antibiotics: Anti-infectives    Start     Dose/Rate  Route Frequency Ordered Stop   03/04/15 1530  ceFAZolin (ANCEF) IVPB 2 g/50 mL premix  Status:  Discontinued     2 g 100 mL/hr over 30 Minutes Intravenous Every 6 hours 03/04/15 1522 03/04/15 1526   03/03/15 0300  ceFAZolin (ANCEF) IVPB 1 g/50 mL premix     1 g 100 mL/hr over 30 Minutes Intravenous 3 times per day 03/02/15 0415     03/02/15 0230  vancomycin (VANCOCIN) 1,500 mg in sodium chloride 0.9 % 500 mL IVPB  Status:  Discontinued     1,500 mg 250 mL/hr over 120 Minutes Intravenous  Once 03/02/15 0225 03/02/15 0415   03/02/15 0230  cefTRIAXone (ROCEPHIN) 2 g in dextrose 5 % 50 mL IVPB     2 g 100 mL/hr over 30 Minutes Intravenous  Once 03/02/15 0225 03/02/15 0334         HPI/Subjective: Noted to be desaturating into 86-89 range, unlabored breathing, anxious tearful about uncontrolled pain  Objective: Filed Vitals:   03/04/15 1415 03/04/15 1532 03/04/15 2226 03/05/15 0514  BP:  145/86 168/76 158/89  Pulse: 111 122 98 98  Temp:  98.4 F (36.9 C) 98.2 F (36.8 C) 98.6 F (37 C)  TempSrc:  Oral Oral Oral  Resp:  20 18 18   Height:      Weight:      SpO2: 98% 98% 98% 98%    Intake/Output Summary (Last 24 hours) at 03/05/15 0915 Last data filed at 03/05/15 0910  Gross per 24 hour  Intake   1301 ml  Output   2500 ml  Net  -1199 ml    Exam:  General: No acute respiratory distress Lungs: Clear to auscultation bilaterally without wheezes or crackles Cardiovascular: Regular rate and rhythm without murmur gallop or rub normal S1 and S2 Abdomen: Nontender, nondistended, soft, bowel sounds positive, no rebound, no ascites, no appreciable mass Extremities status post RTLE amputation, dressing in place   Data Review   Micro Results No results found for this or any previous visit (from the past 240 hour(s)).  Radiology Reports Dg Ankle Complete Right  03/02/2015  CLINICAL DATA:  Possible postop infection, ankle surgery last November. Diffuse foot and ankle pain. EXAM:  RIGHT ANKLE - COMPLETE 3+ VIEW COMPARISON:  Most recent radiographs 02/16/2015 FINDINGS: External fixator in place. Postsurgical change about the ankle with distal fibular resection, single fibular screw remains. Distal tibial resection with unchanged alignment of the tibial talar articulation, mild apex medial talar tilt. Probable decreased dense in the talar dome from prior exam, however diffuse bony under mineralization is seen. Fragmentation of the medial malleolus remains. Progressive soft tissue edema from prior. IMPRESSION: 1. Post distal tibial resection and tibial talar fusion with increasing lucency about the talar dome, as well as increased soft tissue edema, concerning for soft tissue infection and osteomyelitis. Distal fibular resection without complication. 2. External fixator remains in place. Electronically Signed   By: Rubye Oaks M.D.   On: 03/02/2015 01:00   Dg Ankle Complete Right  02/16/2015  CLINICAL DATA:  Pain at operative site, status post external fixation one month ago EXAM: RIGHT ANKLE - COMPLETE 3+ VIEW COMPARISON:  02/06/2015 FINDINGS: Three views of the  right ankle submitted. Again noted postsurgical changes in distal fibula with partial resection. Stable postsurgical changes question resection distal tibia. Again noted external fixation material with 2 proximal screws in tibial shaft. A transverse screw is noted passing through calcaneus. There is no significant change in alignment from prior exam. Diffuse soft tissue swelling around the ankle without change from prior exam. Stable bony fragments just medial to distal tibia and anterior to distal tibia seen on lateral view. Lateral view shows irregularity and erosion superior articular surface of the talus. Osteomyelitis cannot be excluded. Clinical correlation is necessary. IMPRESSION: Again noted postsurgical changes in distal fibula with partial resection. Stable postsurgical changes question resection distal tibia. Again  noted external fixation material with 2 proximal screws in tibial shaft. A transverse screw is noted passing through calcaneus. There is no significant change in alignment from prior exam. Diffuse soft tissue swelling around the ankle without change from prior exam. Stable bony fragments just medial to distal tibia and anterior to distal tibia seen on lateral view. Lateral view shows irregularity and erosion superior articular surface of the talus. Osteomyelitis cannot be excluded. Clinical correlation is necessary. Electronically Signed   By: Natasha Mead M.D.   On: 02/16/2015 19:46   Dg Ankle Complete Right  02/06/2015  CLINICAL DATA:  Right ankle pain after falling. EXAM: RIGHT ANKLE - COMPLETE 3+ VIEW COMPARISON:  December 31, 2014. FINDINGS: Status post internal fixation of of right lower leg, with proximal screws seen in the mid tibial shaft, and distal screw passing through calcaneus. Severely comminuted fracture of the distal right tibia is noted. There has been surgical resection of the distal fibula. Old healed fracture of distal right fibula is noted. IMPRESSION: Status post internal fixation of severely comminuted distal right tibial fracture. Status post surgical resection of distal fibula. Electronically Signed   By: Lupita Raider, M.D.   On: 02/06/2015 15:25   Dg Foot Complete Right  03/02/2015  CLINICAL DATA:  Diffuse foot and ankle pain, possible postop infection. Surgery last November. EXAM: RIGHT FOOT COMPLETE - 3+ VIEW COMPARISON:  No prior dedicated foot exams. FINDINGS: Ankle findings described with concurrently performed ankle radiographs. Diffuse bony under mineralization. Soft tissue edema about the dorsum of the foot. No bony destructive change involving the foot. IMPRESSION: Soft tissue edema about the foot.  No acute bony abnormality. Ankle findings described on concurrently performed ankle radiographs. Electronically Signed   By: Rubye Oaks M.D.   On: 03/02/2015 01:02      CBC  Recent Labs Lab 03/01/15 2126 03/02/15 0655 03/03/15 0630 03/04/15 0755 03/05/15 0705  WBC 7.5 6.9 4.5 4.4 7.5  HGB 9.9* 9.9* 9.0* 8.8* 9.1*  HCT 32.6* 33.2* 30.5* 29.8* 30.9*  PLT 468* 421* 345 335 422*  MCV 79.7 80.4 81.3 81.2 79.4  MCH 24.2* 24.0* 24.0* 24.0* 23.4*  MCHC 30.4 29.8* 29.5* 29.5* 29.4*  RDW 17.2* 17.2* 17.4* 17.1* 16.8*    Chemistries   Recent Labs Lab 03/01/15 2126 03/02/15 0655 03/03/15 0630 03/04/15 0755 03/05/15 0705  NA 137 137 135 135 133*  K 3.3* 3.5 3.9 4.3 4.7  CL 106 107 103 98* 96*  CO2 20* 22 24 29 29   GLUCOSE 107* 140* 152* 108* 123*  BUN 14 12 10 10 6   CREATININE 0.55 0.51 0.44 0.56 0.56  CALCIUM 9.2 8.7* 8.9 9.1 8.8*  MG  --  1.8  --   --   --   AST 21  --   --  26 45*  ALT 20  --   --  25 29  ALKPHOS 64  --   --  56 70  BILITOT 0.3  --   --  0.2* 1.2   ------------------------------------------------------------------------------------------------------------------ estimated creatinine clearance is 101.6 mL/min (by C-G formula based on Cr of 0.56). ------------------------------------------------------------------------------------------------------------------ No results for input(s): HGBA1C in the last 72 hours. ------------------------------------------------------------------------------------------------------------------ No results for input(s): CHOL, HDL, LDLCALC, TRIG, CHOLHDL, LDLDIRECT in the last 72 hours. ------------------------------------------------------------------------------------------------------------------ No results for input(s): TSH, T4TOTAL, T3FREE, THYROIDAB in the last 72 hours.  Invalid input(s): FREET3 ------------------------------------------------------------------------------------------------------------------ No results for input(s): VITAMINB12, FOLATE, FERRITIN, TIBC, IRON, RETICCTPCT in the last 72 hours.  Coagulation profile No results for input(s): INR, PROTIME in the last 168  hours.  No results for input(s): DDIMER in the last 72 hours.  Cardiac Enzymes No results for input(s): CKMB, TROPONINI, MYOGLOBIN in the last 168 hours.  Invalid input(s): CK ------------------------------------------------------------------------------------------------------------------ Invalid input(s): POCBNP   CBG: No results for input(s): GLUCAP in the last 168 hours.     Studies: No results found.    Lab Results  Component Value Date   HGBA1C 5.6 03/02/2015   Lab Results  Component Value Date   [facility]  12/10/2007    87        Total Cholesterol/HDL:CHD Risk Coronary Heart Disease Risk Table                     Men   Women  1/2 Average Risk   3.4   3.3   CREATININE 0.56 03/05/2015       Scheduled Meds: . ALPRAZolam  0.5 mg Oral BID  .  ceFAZolin (ANCEF) IV  1 g Intravenous 3 times per day  . enoxaparin (LOVENOX) injection  40 mg Subcutaneous Q24H  . gabapentin  600 mg Oral TID  . oxyCODONE  10 mg Oral Q12H   Continuous Infusions: . sodium chloride 75 mL/hr at 03/03/15 2216  . sodium chloride 10 mL/hr at 03/04/15 1554  . lactated ringers      Principal Problem:   Osteomyelitis (HCC) Active Problems:   Cellulitis   Acute osteomyelitis of right foot (HCC)    Time spent: 45 minutes   [facility]  [facility] Pager [phone]. If 7PM-7AM, please contact night-coverage at [email or web address], password [facility] 03/05/2015, 9:15 AM  LOS: 3 days

## 2015-03-05 NOTE — Clinical Social Work Note (Signed)
Clinical Social Worker received referral for possible ST-SNF placement.  Chart reviewed.  PT/OT pending with patient, however per chart, patient has stated that she will refuse SNF and plans to return home at discharge.  Spoke with RN Case Manager who has already made arrangements for home health at discharge.    CSW signing off - please re consult if social work needs arise.  Monique [name], [place] [phone]

## 2015-03-05 NOTE — Evaluation (Signed)
Physical Therapy Evaluation Patient Details Name: Monique [name] MRN: [id number] DOB: 11-25-1975 Today's Date: 03/05/2015   History of Present Illness  Pt is a 40 y.o. 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  Clinical Impression  Pt admitted with above diagnosis. Pt currently with functional limitations due to the deficits listed below (see PT Problem List). Mobility limited on eval by pain. Pt will benefit from skilled PT to increase their independence and safety with mobility to allow discharge to the venue listed below.  Pt is refusing SNF at d/c for further therapy. Therefore, recommending HHPT. Pt has DME from previous hospitalization.     Follow Up Recommendations Home health PT;Supervision for mobility/OOB    Equipment Recommendations  None recommended by PT    Recommendations for Other Services       Precautions / Restrictions Precautions Precautions: Fall Restrictions RUE Weight Bearing: Non weight bearing      Mobility  Bed Mobility Overal bed mobility: Needs Assistance Bed Mobility: Supine to Sit     Supine to sit: Min guard     General bed mobility comments: use of bed rails  Transfers Overall transfer level: Needs assistance Equipment used: None Transfers: Sit to/from [employer] Sit to Stand: Min guard Stand pivot transfers: Min guard       General transfer comment: verbal cues for sequencing and safety  Ambulation/Gait             General Gait Details: Unable to progress with gait due to pain.  Stairs            Wheelchair Mobility    Modified Rankin (Stroke Patients Only)       Balance                                             Pertinent Vitals/Pain Pain Assessment: 0-10 Pain Score: 10-Worst pain ever Pain Location: RLE Pain Descriptors /  Indicators: Aching Pain Intervention(s): Monitored during session;Patient requesting pain meds-RN notified;Repositioned;Limited activity within patient's tolerance    Home Living Family/patient expects to be discharged to:: Private residence Living Arrangements: Spouse/significant other Available Help at Discharge: Family;Available PRN/intermittently Type of Home: House Home Access: Stairs to enter Entrance Stairs-Rails: Right;Left;Can reach both Entrance Stairs-Number of Steps: 3 Home Layout: One level Home Equipment: Wheelchair - [employer] - 2 wheels;Other (comment) (1 crutch)      Prior Function Level of Independence: Independent with assistive device(s)               Hand Dominance   Dominant Hand: Right    Extremity/Trunk Assessment                         Communication   Communication: No difficulties  Cognition Arousal/Alertness: Lethargic;Suspect due to medications Behavior During Therapy: Agitated;Anxious Overall Cognitive Status: Within Functional Limits for tasks assessed                      General Comments      Exercises        Assessment/Plan    PT Assessment Patient needs continued PT services  PT Diagnosis Difficulty walking;Acute pain   PT Problem List Decreased activity tolerance;Decreased balance;Decreased mobility;Pain;Decreased knowledge of precautions;Decreased safety awareness  PT Treatment Interventions DME instruction;Gait training;Stair training;Functional mobility training;Therapeutic activities;Therapeutic exercise;Patient/family education;Wheelchair mobility training;Balance training   PT Goals (Current goals can be found in the Care Plan section) Acute Rehab PT Goals Patient Stated Goal: home PT Goal Formulation: With patient Time For Goal Achievement: 03/19/15 Potential to Achieve Goals: Good    Frequency Min 3X/week   Barriers to discharge        Co-evaluation               End of Session  Equipment Utilized During Treatment: Gait belt Activity Tolerance: Patient limited by pain Patient left: in chair;with call bell/phone within reach Nurse Communication: Mobility status;Patient requests pain meds         Time: 1219-1242 PT Time Calculation (min) (ACUTE ONLY): 23 min   Charges:   PT Evaluation $PT Eval Moderate Complexity: 1 Procedure PT Treatments $Therapeutic Activity: 8-22 mins   PT G Codes:        Ilda FoilGarrow, Dynasty Holquin Rene 03/05/2015, 2:04 PM

## 2015-03-05 NOTE — Care Management Note (Signed)
Case Management Note  Patient Details  Name: Monique AgeeKimberly E XXXFlinchum MRN: [id number] Date of Birth: 07/30/1975  Subjective/Objective:                  Transtibial amputation on the right  Action/Plan: NCM spoke with patient. She reports she does not live alone but the person(s) who live with her work. She will not have a lot of assistance at home. States she does not want to go to rehab when she is discharged. She would like to return home with home care. She requests [facility] for home health needs. She has used them in the past. [facility]. Patient does not have insurance. She states has applied or disability and Medicaid. She has a wheelchair at home. Had a walker but does not know where it is. Will continue to follow for needs. PT evaluation is pending.   Expected Discharge Date:     unknown             Expected Discharge Plan:  Home w Home Health Services  In-House Referral:     Discharge planning Services  CM Consult  Post Acute Care Choice:  Home Health Choice offered to:  Patient  DME Arranged:    DME Agency:     HH Arranged:   pending  HH Agency:  [facility]  Status of Service:  In process, will continue to follow  Medicare Important Message Given:    Date Medicare IM Given:    Medicare IM give by:    Date Additional Medicare IM Given:    Additional Medicare Important Message give by:     If discussed at Long Length of Stay Meetings, dates discussed:    Additional Comments:  Antony HasteBennett, Giorgi Debruin Harris, RN 03/05/2015, 8:49 AM

## 2015-03-05 NOTE — Progress Notes (Signed)
03/05/15 Patient refuses to wait for staff to place [facility], stated turn off all the bed and chair alarms.

## 2015-03-06 ENCOUNTER — Inpatient Hospital Stay (HOSPITAL_COMMUNITY): Payer: Medicaid Other

## 2015-03-06 DIAGNOSIS — R509 Fever, unspecified: Secondary | ICD-10-CM | POA: Insufficient documentation

## 2015-03-06 LAB — CBC
HCT: 30.2 % — ABNORMAL LOW (ref 36.0–46.0)
HEMOGLOBIN: 9.1 g/dL — AB (ref 12.0–15.0)
MCH: 23.8 pg — AB (ref 26.0–34.0)
MCHC: 30.1 g/dL (ref 30.0–36.0)
MCV: 79.1 fL (ref 78.0–100.0)
Platelets: 367 10*3/uL (ref 150–400)
RBC: 3.82 MIL/uL — AB (ref 3.87–5.11)
RDW: 17 % — ABNORMAL HIGH (ref 11.5–15.5)
WBC: 7.2 10*3/uL (ref 4.0–10.5)

## 2015-03-06 LAB — BASIC METABOLIC PANEL
ANION GAP: 9 (ref 5–15)
BUN: 6 mg/dL (ref 6–20)
CALCIUM: 8.5 mg/dL — AB (ref 8.9–10.3)
CO2: 27 mmol/L (ref 22–32)
Chloride: 95 mmol/L — ABNORMAL LOW (ref 101–111)
Creatinine, Ser: 0.5 mg/dL (ref 0.44–1.00)
GFR calc Af Amer: >60 mL/min (ref >60)
Glucose, Bld: 115 mg/dL — ABNORMAL HIGH (ref 65–99)
POTASSIUM: 4.3 mmol/L (ref 3.5–5.1)
Sodium: 131 mmol/L — ABNORMAL LOW (ref 135–145)

## 2015-03-06 MED ORDER — OXYCODONE HCL 10 MG PO TABS: 10.0000 mg | ORAL_TABLET | Freq: Four times a day (QID) | ORAL | Status: DC | PRN

## 2015-03-06 MED ORDER — SENNOSIDES-DOCUSATE SODIUM 8.6-50 MG PO TABS: 1.0000 | ORAL_TABLET | Freq: Every evening | ORAL | Status: DC | PRN

## 2015-03-06 MED ORDER — OXYCODONE HCL ER 10 MG PO T12A
20.0000 mg | EXTENDED_RELEASE_TABLET | Freq: Two times a day (BID) | ORAL | Status: AC
  Administered 2015-03-06 – 2015-03-07 (×3): 20 mg via ORAL
  Filled 2015-03-06 (×3): qty 2

## 2015-03-06 MED ORDER — ALPRAZOLAM 0.5 MG PO TABS: 0.5000 mg | ORAL_TABLET | Freq: Two times a day (BID) | ORAL | Status: DC

## 2015-03-06 NOTE — Progress Notes (Addendum)
Pt has a lot of pain medicine and antianxiety medicine scheduled and as needed. She had some during the night. Her morning vitals showed HR to be in the 120's, O2 sat to be 86 on room air. Sh e was put on 2L of O2, and her O2 sat went up to 95. Pt still wants Oxy IR within 30mins of 2mg  of Dilaudid in the morning. MD/NP Claiborne BillingsCallahan was notified. He advised to hold off more pain meds, he ordered cont. Pulse oximetry monitoring and continuous 2L of O2 therapy.   Pt's MRSA PCR was negative, the MRSA precaution was discontinued per protocol.

## 2015-03-06 NOTE — Discharge Instructions (Signed)
Keep your right leg dressing clean and dry

## 2015-03-06 NOTE — Progress Notes (Signed)
Physician Discharge Summary  Monique [name] MRN: [id number] DOB/AGE: October 01, 1975 40 y.o.  PCP: [name] Patient   Admit date: 03/02/2015 Discharge date: 03/06/2015  Discharge Diagnoses:     Principal Problem:   Osteomyelitis [facility]) Active Problems:   Cellulitis   Acute osteomyelitis of right foot ([place])   Fever  Addendum-discharge held due to patient now considering SNF  Follow-up recommendations Follow-up with PCP in 3-5 days , including all  additional recommended appointments as below Follow-up CBC, CMP in 3-5 days        Medication List    STOP taking these medications        amitriptyline 10 MG tablet  Commonly known as:  ELAVIL     ibuprofen 800 MG tablet  Commonly known as:  ADVIL,MOTRIN     oxyCODONE-acetaminophen 5-325 MG tablet  Commonly known as:  PERCOCET/ROXICET      TAKE these medications        ALPRAZolam 0.5 MG tablet  Commonly known as:  XANAX  Take 1 tablet (0.5 mg total) by mouth 2 (two) times daily. Take 0.55m by mouth in the afternoon and take 0.525mby mouth at bedtime     buPROPion 100 MG 12 hr tablet  Commonly known as:  WELLBUTRIN SR  Take 1 tablet (100 mg total) by mouth 2 (two) times daily.     gabapentin 300 MG capsule  Commonly known as:  NEURONTIN  Take 300 mg by mouth 3 (three) times daily.     methocarbamol 500 MG tablet  Commonly known as:  ROBAXIN  Take 1 tablet (500 mg total) by mouth every 6 (six) hours as needed for muscle spasms.     nicotine 21 mg/24hr patch  Commonly known as:  NICODERM CQ - dosed in mg/24 hours  Place 1 patch (21 mg total) onto the skin daily.     Oxycodone HCl 10 MG Tabs  Take 1 tablet (10 mg total) by mouth every 6 (six) hours as needed for moderate pain.     senna-docusate 8.6-50 MG tablet  Commonly known as:  Senokot-S  Take 1 tablet by mouth at bedtime as needed for mild constipation.         Discharge Condition: Stable   Discharge Instructions     No Known  Allergies    Disposition: 01-Home or Self Care   Consults:  Orthopedics     Significant Diagnostic Studies:  Dg Ankle Complete Right  03/02/2015  CLINICAL DATA:  Possible postop infection, ankle surgery last November. Diffuse foot and ankle pain. EXAM: RIGHT ANKLE - COMPLETE 3+ VIEW COMPARISON:  Most recent radiographs 02/16/2015 FINDINGS: External fixator in place. Postsurgical change about the ankle with distal fibular resection, single fibular screw remains. Distal tibial resection with unchanged alignment of the tibial talar articulation, mild apex medial talar tilt. Probable decreased dense in the talar dome from prior exam, however diffuse bony under mineralization is seen. Fragmentation of the medial malleolus remains. Progressive soft tissue edema from prior. IMPRESSION: 1. Post distal tibial resection and tibial talar fusion with increasing lucency about the talar dome, as well as increased soft tissue edema, concerning for soft tissue infection and osteomyelitis. Distal fibular resection without complication. 2. External fixator remains in place. Electronically Signed   By: MeJeb Levering.D.   On: 03/02/2015 01:00   Dg Ankle Complete Right  02/16/2015  CLINICAL DATA:  Pain at operative site, status post external fixation one month ago EXAM: RIGHT ANKLE - COMPLETE 3+  VIEW COMPARISON:  02/06/2015 FINDINGS: Three views of the right ankle submitted. Again noted postsurgical changes in distal fibula with partial resection. Stable postsurgical changes question resection distal tibia. Again noted external fixation material with 2 proximal screws in tibial shaft. A transverse screw is noted passing through calcaneus. There is no significant change in alignment from prior exam. Diffuse soft tissue swelling around the ankle without change from prior exam. Stable bony fragments just medial to distal tibia and anterior to distal tibia seen on lateral view. Lateral view shows irregularity and  erosion superior articular surface of the talus. Osteomyelitis cannot be excluded. Clinical correlation is necessary. IMPRESSION: Again noted postsurgical changes in distal fibula with partial resection. Stable postsurgical changes question resection distal tibia. Again noted external fixation material with 2 proximal screws in tibial shaft. A transverse screw is noted passing through calcaneus. There is no significant change in alignment from prior exam. Diffuse soft tissue swelling around the ankle without change from prior exam. Stable bony fragments just medial to distal tibia and anterior to distal tibia seen on lateral view. Lateral view shows irregularity and erosion superior articular surface of the talus. Osteomyelitis cannot be excluded. Clinical correlation is necessary. Electronically Signed   By: Lahoma Crocker M.D.   On: 02/16/2015 19:46   Dg Ankle Complete Right  02/06/2015  CLINICAL DATA:  Right ankle pain after falling. EXAM: RIGHT ANKLE - COMPLETE 3+ VIEW COMPARISON:  December 31, 2014. FINDINGS: Status post internal fixation of of right lower leg, with proximal screws seen in the mid tibial shaft, and distal screw passing through calcaneus. Severely comminuted fracture of the distal right tibia is noted. There has been surgical resection of the distal fibula. Old healed fracture of distal right fibula is noted. IMPRESSION: Status post internal fixation of severely comminuted distal right tibial fracture. Status post surgical resection of distal fibula. Electronically Signed   By: Marijo Conception, M.D.   On: 02/06/2015 15:25   Dg Foot Complete Right  03/02/2015  CLINICAL DATA:  Diffuse foot and ankle pain, possible postop infection. Surgery last November. EXAM: RIGHT FOOT COMPLETE - 3+ VIEW COMPARISON:  No prior dedicated foot exams. FINDINGS: Ankle findings described with concurrently performed ankle radiographs. Diffuse bony under mineralization. Soft tissue edema about the dorsum of the foot.  No bony destructive change involving the foot. IMPRESSION: Soft tissue edema about the foot.  No acute bony abnormality. Ankle findings described on concurrently performed ankle radiographs. Electronically Signed   By: Jeb Levering M.D.   On: 03/02/2015 01:02        Filed Weights   03/02/15 0800  Weight: 88.451 kg (195 lb)     Microbiology: No results found for this or any previous visit (from the past 240 hour(s)).     Blood Culture    Component Value Date/Time   SDES ABSCESS 12/19/2014 2147   SDES ABSCESS 12/19/2014 2147   SPECREQUEST RIGHT CHIN PT ON VANCOMYCIN 12/19/2014 2147   SPECREQUEST RIGHT CHIN PT ON VANCOMYCIN 12/19/2014 2147   CULT  12/19/2014 2147    NO ANAEROBES ISOLATED Performed at [facility]  12/19/2014 2147    MODERATE STAPHYLOCOCCUS AUREUS Note: RIFAMPIN AND GENTAMICIN SHOULD NOT BE USED AS SINGLE DRUGS FOR TREATMENT OF STAPH INFECTIONS. This organism DOES NOT demonstrate inducible Clindamycin resistance in vitro. Performed at [facility] 12/24/2014 FINAL 12/19/2014 2147   REPTSTATUS 12/22/2014 FINAL 12/19/2014 2147  Labs: Results for orders placed or performed during the hospital encounter of 03/02/15 (from the past 48 hour(s))  I-Stat beta hCG blood, ED     Status: None   Collection Time: 03/04/15 12:03 PM  Result Value Ref Range   I-stat hCG, quantitative <5.0 <5 mIU/mL   Comment 3            Comment:   GEST. AGE      CONC.  (mIU/mL)   <=1 WEEK        5 - 50     2 WEEKS       50 - 500     3 WEEKS       100 - 10,000     4 WEEKS     1,000 - 30,000        FEMALE AND NON-PREGNANT FEMALE:     LESS THAN 5 mIU/mL   CBC     Status: Abnormal   Collection Time: 03/05/15  7:05 AM  Result Value Ref Range   WBC 7.5 4.0 - 10.5 K/uL   RBC 3.89 3.87 - 5.11 MIL/uL   Hemoglobin 9.1 (L) 12.0 - 15.0 g/dL   HCT 30.9 (L) 36.0 - 46.0 %   MCV 79.4 78.0 - 100.0 fL   MCH 23.4 (L) 26.0 - 34.0 pg   MCHC 29.4 (L)  30.0 - 36.0 g/dL   RDW 16.8 (H) 11.5 - 15.5 %   Platelets 422 (H) 150 - 400 K/uL  Comprehensive metabolic panel     Status: Abnormal   Collection Time: 03/05/15  7:05 AM  Result Value Ref Range   Sodium 133 (L) 135 - 145 mmol/L   Potassium 4.7 3.5 - 5.1 mmol/L   Chloride 96 (L) 101 - 111 mmol/L   CO2 29 22 - 32 mmol/L   Glucose, Bld 123 (H) 65 - 99 mg/dL   BUN 6 6 - 20 mg/dL   Creatinine, Ser 0.56 0.44 - 1.00 mg/dL   Calcium 8.8 (L) 8.9 - 10.3 mg/dL   Total Protein 8.0 6.5 - 8.1 g/dL   Albumin 3.1 (L) 3.5 - 5.0 g/dL   AST 45 (H) 15 - 41 U/L   ALT 29 14 - 54 U/L   Alkaline Phosphatase 70 38 - 126 U/L   Total Bilirubin 1.2 0.3 - 1.2 mg/dL   GFR calc non Af Amer >60 >60 mL/min   GFR calc Af Amer >60 >60 mL/min    Comment: (NOTE) The eGFR has been calculated using the CKD EPI equation. This calculation has not been validated in all clinical situations. eGFR's persistently <60 mL/min signify possible Chronic Kidney Disease.    Anion gap 8 5 - 15     Lipid Panel     Component Value Date/Time   CHOL  12/10/2007 0720    157        ATP III CLASSIFICATION:  <200     mg/dL   Desirable  200-239  mg/dL   Borderline High  >=240    mg/dL   High   TRIG 155* 12/10/2007 0720   HDL * 12/10/2007 0720    39 QA FLAGS MODIFIED BY DEMOGRAPHIC UPDATE ON 10/14 AT 1115   CHOLHDL 4.0 12/10/2007 0720   VLDL 31 12/10/2007 0720   LDLCALC  12/10/2007 0720    87        Total Cholesterol/HDL:CHD Risk Coronary Heart Disease Risk Table  Men   Women  1/2 Average Risk   3.4   3.3     Lab Results  Component Value Date   HGBA1C 5.6 03/02/2015     Lab Results  Component Value Date   [facility] [place]  12/10/2007    87        Total Cholesterol/HDL:CHD Risk Coronary Heart Disease Risk Table                     Men   Women  1/2 Average Risk   3.4   3.3   CREATININE 0.56 03/05/2015     Brief summary 40 y.o. 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 The patient reports about a week of increased right ankle pain, redness, swelling, and drainage. The pain is located on the lateral right ankle and heel, is aching and sharp, and constant, worse with movement. She has had chills, emesis. The patient states that she broke her ankle in August 2013, had a surgical infection immediately after requiring PICC, then was discharged from the care of her orthopedist because of inability to pay so that she had to "go to the ER every month for a year for [oral] antibiotics", until she started coming to see Dr. Sharol Given here, who operated on her ankle in August 2016 and then November 2016.   Patient suffered a right trimalleolar fracture and syndesmotic rupture in 10/2013 at [facility], which was complicated by premature weightbearing/non-compliance and then cellulitis treated with 6 weeks IV antibiotics by PICC. She subsequently no-showed her orthopedics appointments, was treated several times with oral antibiotics from jail for pain/redness/swelling/drainage of her ankle, was discharged from ortho clinic for a time for no-show appointments, and was last seen last May at which time Dr. Linton Rump recommended surgery to remove plate/screws followed by IV antibiotics. The patient agreed to surgery, but never followed up. Subsequently, she was found altered and admitted to [facility] with drug overdose.  Dr. Sharol Given removed her ankle hardware at that time, and she was treated with oral IV antibiotics for 12 weeks, which she did not take and was admitted again a month later for osteomyelitis, this time discharged to SNF with PICC, which she appears to have completed. She underwent a fusion and antibiotic bead placement in Nov. 2016. Patient evaluated by Dr. 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 Cellulitis with  progressive osteomyelitis of the right ankle:  Has a history of MSSA and remote history of MRSA. Chronic osteomyelitis during the last year. There is no WBC, tachycardia, signs of systemic infection upon admission.   Sepsis syndrome doubted. Initially started on cefazolin Lactic acid 2.13> 0.74, CRP 1.7, HIV nonreactive Dr. Sharol Given performed transtibial amputation 1/6, indication was Abscess ulceration osteomyelitis right ankle with retained external fixator On  Cefazolin 1g q8hrs IV, for 4 days , antibiotics discontinued prior to discharge post amputation Patient refused SNF and would like to go home with home health   2. Chronic pain:  Takes bupropion or amitriptyline at home. Amitriptyline held because of somnolence -Percocet change to oxycodone 10 mg every 6 hours prn [40 tablets provided], continue gabapentin Also started on OxyContin during this admission for uncontrolled pain, not continued at discharge  Written instructions about her current pain regimen given to the patient upon request Noted to have significant narcotic dependence  requiring escalating doses  3. Chronic anxiety: -Continue home alprazolam, received excessive amounts of Ativan upon request  4. Hypokalemia: Repleted  5. Anemia of chronic disease: Stable. Presumed from chronic infection. Hemoglobin stable postoperatively     Discharge Exam:    Blood pressure 130/69, pulse 122, temperature 99.2 F (37.3 C), temperature source Oral, resp. rate 18, height '5\' 4"'  (1.626 m), weight 88.451 kg (195 lb), last menstrual period 02/16/2015, SpO2 96 %.  General: No acute respiratory distress Lungs: Clear to auscultation bilaterally without wheezes or crackles Cardiovascular: Regular rate and rhythm without murmur gallop or rub normal S1 and S2 Abdomen: Nontender, nondistended, soft, bowel sounds positive, no rebound, no ascites, no appreciable mass Extremities status post RTLE  amputation, dressing in place          Follow-up Information    Follow up with [facility] On 03/24/2015.   Specialty:  Internal Medicine   Why:  2:15 for hospital follow up.  , You will be seeing [facility] information:   [place] [phone]      Follow up with DUDA,MARCUS V, MD In 3 weeks.   Specialty:  Orthopedic Surgery   Contact information:   [address] [place] [address] [phone]       Signed: Reyne Dumas 03/06/2015, 8:48 AM        Time spent >45 mins

## 2015-03-06 NOTE — Care Management (Addendum)
CM [facility]. Patient does not qualify for charity PT,OT or HHA. Per [facility], she does not have a qualifying diagnosis. Per last MD note, patient is considering SNF. CSW notified [facility]. Physicist, medicalCrystal Champagne Paletta RN BSN CCM

## 2015-03-06 NOTE — Progress Notes (Signed)
Pt now for SNF, noted.  Will need LOG of payment as pt is uninsured.  CSW to assess and begin LOG bed search.  Pollyann SavoyJody Shantel Wesely, LCSW Weekend Coverage  [phone]

## 2015-03-06 NOTE — Progress Notes (Signed)
Physical Therapy Progress Note  Per MD note, pt is now agreeable to SNF. D/C recommendations updated.  Aida RaiderWendy Jeyden Coffelt, PT  Office # [phone] Pager [phone]

## 2015-03-06 NOTE — Clinical Social Work Note (Signed)
Clinical Social Work Assessment  Patient Details  Name: KHAMYA TOPP MRN: [id number] Date of Birth: 12-05-1975  Date of referral:  03/06/15               Reason for consult:  Facility Placement                Permission sought to share information with:  Chartered certified accountant granted to share information::  No  Name::        Agency::     Relationship::     Contact Information:     Housing/Transportation Living arrangements for the past 2 months:  Single Family Home Source of Information:  Patient Patient Interpreter Needed:  None Criminal Activity/Legal Involvement Pertinent to Current Situation/Hospitalization:  No - Comment as needed Significant Relationships:  Other Family Members, Friend Lives with:  Friends, Roommate Do you feel safe going back to the place where you live?  No Need for family participation in patient care:  Yes (Comment)  Care giving concerns: Pt does not feel comfortable asking the people with whom she lives to help take care of her at d/c.  When asked about the relationship between pt and "the people" pt describes them as being roommates. Pt thinks that her grandmother may let her stay with her at d/c if she has [place].  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  Pt would like to talk to [facility] re: reason for her denial for [place].   Social Worker assessment / plan:  CSW met with pt to discuss SNF placement at d/c.  Pt has previously been to [facility] and doesn't want to return there because there were"may different kinds of people there" and "it scared her."  Explained that pt that she would require an LOG and because of this, her choice of facility will be quite limited.  Pt indicated that she understood this and wishes  to proceed with bed search, which CSW will initiate.  Employment status:  Disabled (Comment on whether or not currently receiving Disability) (disability and medicaid are pending, per pt) Insurance information:    PT Recommendations:  [place] / Referral to community resources:  [place]  Patient/Family's Response to care: Agreeable to [facility] SNF search as pt has been denied charity HHC through [facility]. Patient/Family's Understanding of and Emotional Response to Diagnosis, Current Treatment, and Prognosis:  Pt very lethargic during meeting with CSW, often nodding off during conversation.  Pt was c/o "hurting really bad" had slurred speech, and trouble finding her words.   Emotional Assessment Appearance:  Appears stated age Attitude/Demeanor/Rapport:  Lethargic Affect (typically observed):  Appropriate Orientation:  Oriented to Self, Oriented to Place, Oriented to  Time, Oriented to Situation Alcohol / Substance use:  Not Applicable Psych involvement (Current and /or in the community):  No (Comment)  Discharge Needs  Concerns to be addressed:  Financial / Insurance Concerns, Lack of Support Readmission within the last 30 days:  No Current discharge risk:  Dependent with Mobility, Inadequate Financial Supports, Lack of support system Barriers to Discharge:  Inadequate or no insurance   Yoshi Vicencio M, LCSW 03/06/2015, 3:13 PM

## 2015-03-06 NOTE — Progress Notes (Addendum)
Pt wanted xanax. Xanax was pulled and taken to the room. Pt was asleep and the xanax was returned to the Pyxis. Pt had said to let her sleep if she was sleeping, that she does not want to be woken up to take the Xanax med if she was sleeping. Pt requested when she woke up at around 4am. Medication was given.

## 2015-03-07 ENCOUNTER — Encounter (HOSPITAL_COMMUNITY): Payer: Self-pay | Admitting: Orthopedic Surgery

## 2015-03-07 MED ORDER — ALPRAZOLAM 0.5 MG PO TABS: 0.5000 mg | ORAL_TABLET | Freq: Two times a day (BID) | ORAL | Status: DC

## 2015-03-07 MED ORDER — OXYCODONE HCL ER 20 MG PO T12A: 20.0000 mg | EXTENDED_RELEASE_TABLET | Freq: Two times a day (BID) | ORAL | Status: DC

## 2015-03-07 MED ORDER — OXYCODONE HCL 10 MG PO TABS: 10.0000 mg | ORAL_TABLET | Freq: Four times a day (QID) | ORAL | Status: DC | PRN

## 2015-03-07 NOTE — Care Management Note (Addendum)
Case Management Note  Patient Details  Name: Monique [name] MRN: [id number] Date of Birth: 12/13/1975  Subjective/Objective:    Patient is s/p  R BKA amputation, not been able to ambulate with physical therapy due to pain, pt eval recs snf, awaiting passar per CSW,plan for dc to snf when passar received.  Patient [place] she [place]  [place] and [place] other family members, patient does not have insurance , will not be able to get hhpt, which she needs.  NCM called [facility] to see why patient was denied, Judeth CornfieldStephanie with [facility] states she would not qualify for charity for hhpt but [facility] and Child psychotherapistocial Worker.   Patient has agreed to go to snf to get the care that she will need per patient, NCM gave patient pain clinic resources with phone numbers.                   Action/Plan:   Expected Discharge Date:                  Expected Discharge Plan:  Skilled Nursing Facility  In-House Referral:  Clinical Social Work  Discharge planning Services  CM Consult  Post Acute Care Choice:  Home Health Choice offered to:  Patient  DME Arranged:    DME Agency:     HH Arranged:    HH Agency:  [facility]  Status of Service:  Completed, signed off  Medicare Important Message Given:    Date Medicare IM Given:    Medicare IM give by:    Date Additional Medicare IM Given:    Additional Medicare Important Message give by:     If discussed at Long Length of Stay Meetings, dates discussed:    Additional Comments:  Leone Havenaylor, Savaughn Karwowski [place], RN 03/07/2015, 7:54 PM

## 2015-03-07 NOTE — Progress Notes (Addendum)
Physician Discharge Summary  Monique [name] MRN: [id number] DOB/AGE: 1975-10-28 40 y.o.  PCP: [name] Patient   Admit date: 03/02/2015 Discharge date: 03/07/2015  Discharge Diagnoses:     Principal Problem:   Osteomyelitis ([place]) Active Problems:   Cellulitis   Acute osteomyelitis of right foot ([place])   Fever osteomyelitis right ankle with retained external fixator status post trans-tibial amputation    Follow-up recommendations Follow-up with PCP in 3-5 days , including all  additional recommended appointments as below Follow-up CBC, CMP in 3-5 days Patient now considering SNF  placement      Medication List    STOP taking these medications        amitriptyline 10 MG tablet  Commonly known as:  ELAVIL     ibuprofen 800 MG tablet  Commonly known as:  ADVIL,MOTRIN     oxyCODONE-acetaminophen 5-325 MG tablet  Commonly known as:  PERCOCET/ROXICET      TAKE these medications        ALPRAZolam 0.5 MG tablet  Commonly known as:  XANAX  Take 1 tablet (0.5 mg total) by mouth 2 (two) times daily. Take 0.25m by mouth in the afternoon and take 0.555mby mouth at bedtime     buPROPion 100 MG 12 hr tablet  Commonly known as:  WELLBUTRIN SR  Take 1 tablet (100 mg total) by mouth 2 (two) times daily.     gabapentin 300 MG capsule  Commonly known as:  NEURONTIN  Take 300 mg by mouth 3 (three) times daily.     methocarbamol 500 MG tablet  Commonly known as:  ROBAXIN  Take 1 tablet (500 mg total) by mouth every 6 (six) hours as needed for muscle spasms.     nicotine 21 mg/24hr patch  Commonly known as:  NICODERM CQ - dosed in mg/24 hours  Place 1 patch (21 mg total) onto the skin daily.     oxyCODONE 20 mg 12 hr tablet  Commonly known as:  OXYCONTIN  Take 1 tablet (20 mg total) by mouth every 12 (twelve) hours.     Oxycodone HCl 10 MG Tabs  Take 1 tablet (10 mg total) by mouth every 6 (six) hours as needed.     senna-docusate 8.6-50 MG tablet  Commonly known  as:  Senokot-S  Take 1 tablet by mouth at bedtime as needed for mild constipation.         Discharge Condition: Stable   Discharge Instructions   Discharge Instructions    Diet - low sodium heart healthy    Complete by:  As directed      Increase activity slowly    Complete by:  As directed            No Known Allergies    Disposition: 01-Home or Self Care   Consults:  Orthopedics     Significant Diagnostic Studies:  Dg Ankle Complete Right  03/02/2015  CLINICAL DATA:  Possible postop infection, ankle surgery last November. Diffuse foot and ankle pain. EXAM: RIGHT ANKLE - COMPLETE 3+ VIEW COMPARISON:  Most recent radiographs 02/16/2015 FINDINGS: External fixator in place. Postsurgical change about the ankle with distal fibular resection, single fibular screw remains. Distal tibial resection with unchanged alignment of the tibial talar articulation, mild apex medial talar tilt. Probable decreased dense in the talar dome from prior exam, however diffuse bony under mineralization is seen. Fragmentation of the medial malleolus remains. Progressive soft tissue edema from prior. IMPRESSION: 1. Post distal tibial resection and  tibial talar fusion with increasing lucency about the talar dome, as well as increased soft tissue edema, concerning for soft tissue infection and osteomyelitis. Distal fibular resection without complication. 2. External fixator remains in place. Electronically Signed   By: Jeb Levering M.D.   On: 03/02/2015 01:00   Dg Ankle Complete Right  02/16/2015  CLINICAL DATA:  Pain at operative site, status post external fixation one month ago EXAM: RIGHT ANKLE - COMPLETE 3+ VIEW COMPARISON:  02/06/2015 FINDINGS: Three views of the right ankle submitted. Again noted postsurgical changes in distal fibula with partial resection. Stable postsurgical changes question resection distal tibia. Again noted external fixation material with 2 proximal screws in tibial shaft.  A transverse screw is noted passing through calcaneus. There is no significant change in alignment from prior exam. Diffuse soft tissue swelling around the ankle without change from prior exam. Stable bony fragments just medial to distal tibia and anterior to distal tibia seen on lateral view. Lateral view shows irregularity and erosion superior articular surface of the talus. Osteomyelitis cannot be excluded. Clinical correlation is necessary. IMPRESSION: Again noted postsurgical changes in distal fibula with partial resection. Stable postsurgical changes question resection distal tibia. Again noted external fixation material with 2 proximal screws in tibial shaft. A transverse screw is noted passing through calcaneus. There is no significant change in alignment from prior exam. Diffuse soft tissue swelling around the ankle without change from prior exam. Stable bony fragments just medial to distal tibia and anterior to distal tibia seen on lateral view. Lateral view shows irregularity and erosion superior articular surface of the talus. Osteomyelitis cannot be excluded. Clinical correlation is necessary. Electronically Signed   By: Lahoma Crocker M.D.   On: 02/16/2015 19:46   Dg Ankle Complete Right  02/06/2015  CLINICAL DATA:  Right ankle pain after falling. EXAM: RIGHT ANKLE - COMPLETE 3+ VIEW COMPARISON:  December 31, 2014. FINDINGS: Status post internal fixation of of right lower leg, with proximal screws seen in the mid tibial shaft, and distal screw passing through calcaneus. Severely comminuted fracture of the distal right tibia is noted. There has been surgical resection of the distal fibula. Old healed fracture of distal right fibula is noted. IMPRESSION: Status post internal fixation of severely comminuted distal right tibial fracture. Status post surgical resection of distal fibula. Electronically Signed   By: Marijo Conception, M.D.   On: 02/06/2015 15:25   Dg Chest Port 1 View  03/06/2015  CLINICAL  DATA:  Fever. EXAM: PORTABLE CHEST 1 VIEW COMPARISON:  Chest x-rays dated 08/29/2014 and 06/02/2012. FINDINGS: Study is hypoinspiratory with crowding of the perihilar and bibasilar bronchovascular markings. Suspect associated mild atelectasis at each lung base. No significant change compared to the earlier plain film exam of 06/02/2012, therefore, no evidence of pneumonia. Cardiomediastinal silhouette is stable in size and configuration. Osseous and soft tissue structures about the chest are unremarkable. IMPRESSION: Hypoinspiratory changes as described above. No evidence of acute cardiopulmonary abnormality seen. No evidence of pneumonia. Electronically Signed   By: Franki Cabot M.D.   On: 03/06/2015 09:46   Dg Foot Complete Right  03/02/2015  CLINICAL DATA:  Diffuse foot and ankle pain, possible postop infection. Surgery last November. EXAM: RIGHT FOOT COMPLETE - 3+ VIEW COMPARISON:  No prior dedicated foot exams. FINDINGS: Ankle findings described with concurrently performed ankle radiographs. Diffuse bony under mineralization. Soft tissue edema about the dorsum of the foot. No bony destructive change involving the foot. IMPRESSION: Soft tissue edema about  the foot.  No acute bony abnormality. Ankle findings described on concurrently performed ankle radiographs. Electronically Signed   By: Jeb Levering M.D.   On: 03/02/2015 01:02        Filed Weights   03/02/15 0800  Weight: 88.451 kg (195 lb)     Microbiology: No results found for this or any previous visit (from the past 240 hour(s)).     Blood Culture    Component Value Date/Time   SDES ABSCESS 12/19/2014 2147   SDES ABSCESS 12/19/2014 2147   SPECREQUEST RIGHT CHIN PT ON VANCOMYCIN 12/19/2014 2147   SPECREQUEST RIGHT CHIN PT ON VANCOMYCIN 12/19/2014 2147   CULT  12/19/2014 2147    NO ANAEROBES ISOLATED Performed at [facility]  12/19/2014 2147    MODERATE STAPHYLOCOCCUS AUREUS Note: RIFAMPIN AND GENTAMICIN  SHOULD NOT BE USED AS SINGLE DRUGS FOR TREATMENT OF STAPH INFECTIONS. This organism DOES NOT demonstrate inducible Clindamycin resistance in vitro. Performed at [facility] 12/24/2014 FINAL 12/19/2014 2147   REPTSTATUS 12/22/2014 FINAL 12/19/2014 2147      Labs: Results for orders placed or performed during the hospital encounter of 03/02/15 (from the past 48 hour(s))  CBC     Status: Abnormal   Collection Time: 03/06/15  9:45 AM  Result Value Ref Range   WBC 7.2 4.0 - 10.5 K/uL   RBC 3.82 (L) 3.87 - 5.11 MIL/uL   Hemoglobin 9.1 (L) 12.0 - 15.0 g/dL   HCT 30.2 (L) 36.0 - 46.0 %   MCV 79.1 78.0 - 100.0 fL   MCH 23.8 (L) 26.0 - 34.0 pg   MCHC 30.1 30.0 - 36.0 g/dL   RDW 17.0 (H) 11.5 - 15.5 %   Platelets 367 150 - 400 K/uL  Basic metabolic panel     Status: Abnormal   Collection Time: 03/06/15  9:45 AM  Result Value Ref Range   Sodium 131 (L) 135 - 145 mmol/L   Potassium 4.3 3.5 - 5.1 mmol/L   Chloride 95 (L) 101 - 111 mmol/L   CO2 27 22 - 32 mmol/L   Glucose, Bld 115 (H) 65 - 99 mg/dL   BUN 6 6 - 20 mg/dL   Creatinine, Ser 0.50 0.44 - 1.00 mg/dL   Calcium 8.5 (L) 8.9 - 10.3 mg/dL   GFR calc non Af Amer >60 >60 mL/min   GFR calc Af Amer >60 >60 mL/min    Comment: (NOTE) The eGFR has been calculated using the CKD EPI equation. This calculation has not been validated in all clinical situations. eGFR's persistently <60 mL/min signify possible Chronic Kidney Disease.    Anion gap 9 5 - 15     Lipid Panel     Component Value Date/Time   CHOL  12/10/2007 0720    157        ATP III CLASSIFICATION:  <200     mg/dL   Desirable  200-239  mg/dL   Borderline High  >=240    mg/dL   High   TRIG 155* 12/10/2007 0720   HDL * 12/10/2007 0720    39 QA FLAGS MODIFIED BY DEMOGRAPHIC UPDATE ON 10/14 AT 1115   CHOLHDL 4.0 12/10/2007 0720   VLDL 31 12/10/2007 0720   LDLCALC  12/10/2007 0720    87        Total Cholesterol/HDL:CHD Risk Coronary Heart Disease  Risk Table  Men   Women  1/2 Average Risk   3.4   3.3     Lab Results  Component Value Date   HGBA1C 5.6 03/02/2015     Lab Results  Component Value Date   [facility]  12/10/2007    87        Total Cholesterol/HDL:CHD Risk Coronary Heart Disease Risk Table                     Men   Women  1/2 Average Risk   3.4   3.3   CREATININE 0.50 03/06/2015     Brief summary 40 y.o. 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 The patient reports about a week of increased right ankle pain, redness, swelling, and drainage. The pain is located on the lateral right ankle and heel, is aching and sharp, and constant, worse with movement. She has had chills, emesis. The patient states that she broke her ankle in August 2013, had a surgical infection immediately after requiring PICC, then was discharged from the care of her orthopedist because of inability to pay so that she had to "go to the ER every month for a year for [oral] antibiotics", until she started coming to see Dr. Sharol Given here, who operated on her ankle in August 2016 and then November 2016.   Patient suffered a right trimalleolar fracture and syndesmotic rupture in 10/2013 at [facility], which was complicated by premature weightbearing/non-compliance and then cellulitis treated with 6 weeks IV antibiotics by PICC. She subsequently no-showed her orthopedics appointments, was [facility] for pain/redness/swelling/drainage of her ankle, was discharged from ortho clinic for a time for no-show appointments, and was last seen last May at which time Dr. Linton Rump recommended surgery to remove plate/screws followed by IV antibiotics. The patient agreed to surgery, but never followed up. Subsequently, she was found altered and admitted to [facility] with drug  overdose.  Dr. Sharol Given removed her ankle hardware at that time, and she was treated with oral IV antibiotics for 12 weeks, which she did not take and was admitted again a month later for osteomyelitis, this time discharged to SNF with PICC, which she appears to have completed. She underwent a fusion and antibiotic bead placement in Nov. 2016. Patient evaluated by Dr. 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 Cellulitis with progressive osteomyelitis of the right ankle:  Has a history of MSSA and remote history of MRSA. Chronic osteomyelitis during the last year. There is no WBC, tachycardia, signs of systemic infection upon admission.   Sepsis syndrome doubted. Initially started on cefazolin Lactic acid 2.13> 0.74, CRP 1.7, HIV nonreactive Dr. Sharol Given performed transtibial amputation 1/6, indication was Abscess ulceration osteomyelitis right ankle with retained external fixator On  Cefazolin 1g q8hrs IV, for 4 days , antibiotics discontinued prior to discharge post amputation Now considering SNF placement    2. Chronic pain:  Takes bupropion or amitriptyline at home. Amitriptyline held because of somnolence -Percocet change to oxycodone 10 mg every 6 hours prn [40 tablets provided], continue gabapentin Also started on OxyContin during this admission for uncontrolled pain,   Written instructions about her current pain regimen given to the patient upon request Noted to have significant narcotic dependence requiring escalating doses  3. Chronic anxiety: -Continue home alprazolam, received  excessive amounts of Ativan upon request  4. Hypokalemia: Repleted  5. Anemia of chronic disease: Stable. Presumed from chronic infection. Hemoglobin stable postoperatively     Discharge Exam:    Blood pressure 129/69, pulse 99, temperature 98.4 F (36.9 C), temperature source Oral, resp. rate 18, height '5\' 4"'  (1.626 m), weight 88.451 kg (195 lb),  last menstrual period 02/16/2015, SpO2 98 %.  General: No acute respiratory distress Lungs: Clear to auscultation bilaterally without wheezes or crackles Cardiovascular: Regular rate and rhythm without murmur gallop or rub normal S1 and S2 Abdomen: Nontender, nondistended, soft, bowel sounds positive, no rebound, no ascites, no appreciable mass Extremities status post RTLE  amputation, dressing in place     Follow-up Information    Follow up with [facility] On 03/24/2015.   Specialty:  Internal Medicine   Why:  2:15 for hospital follow up.  , You will be seeing [facility] information:   [place] [phone]      Follow up with Newt Minion, MD In 2 weeks.   Specialty:  Orthopedic Surgery   Contact information:   [place] [address] [phone]       Signed: Reyne Dumas 03/07/2015, 10:44 AM        Time spent >45 mins

## 2015-03-07 NOTE — Progress Notes (Signed)
Patient ID: Monique [name], female   DOB: 01/23/1976, 40 y.o.   MRN: [id number] Patient is alert and oriented this morning. Patient states she apologizes for being difficult. Patient states that she would like to look at the brochures for skilled nursing facilities. If patient can independently ambulate with a walker she may be a candidate for discharge to home with home health therapy and home health care. Anticipate skilled nursing care is her best option.  Patient will be disabled for over a year.

## 2015-03-07 NOTE — Progress Notes (Signed)
CSW following patient for SNF placement. Patient could not be found on [facility] website. CSW called [facility] Help Desk, but message says they are closed.  Osborne Cascoadia Deonne Rooks LCSWA [phone]

## 2015-03-07 NOTE — Evaluation (Signed)
Occupational Therapy Evaluation Patient Details Name: Monique [name] MRN: [id number] DOB: 1975-03-13 Today's Date: 03/07/2015    History of Present Illness Pt is a 40 y.o. 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   Clinical Impression   Patient presenting with decreased ADL and functional mobility independence secondary to above. Patient mod I PTA. Patient currently functioning at an overall min to mod assist level. Patient will benefit from acute OT to increase overall independence in the areas of ADLs, functional mobility, and overall safety in order to safely discharge to venue listed below.     Follow Up Recommendations  SNF;Supervision/Assistance - 24 hour    Equipment Recommendations  Other (comment) (TBD; suspect a tub transfer bench and BSC)    Recommendations for Other Services  None at this time   Precautions / Restrictions Precautions Precautions: Fall Restrictions Weight Bearing Restrictions: Yes RUE Weight Bearing: Non weight bearing    Mobility Bed Mobility Overal bed mobility: Needs Assistance Bed Mobility: Supine to Sit     Supine to sit: Min guard     General bed mobility comments: use of bed rails  Transfers Overall transfer level: Needs assistance Equipment used: Rolling walker (2 wheeled) Transfers: Sit to/from Stand Sit to Stand: Mod assist         General transfer comment: verbal cues for sequencing and safety, increased pain during transfer    Balance Overall balance assessment: Needs assistance Sitting-balance support: No upper extremity supported;Single extremity supported Sitting balance-Leahy Scale: Fair     Standing balance support: Bilateral upper extremity supported;During functional activity Standing balance-Leahy Scale: Poor    ADL Overall ADL's : Needs assistance/impaired Eating/Feeding:  Set up;Bed level   Grooming: Set up;Bed level   Upper Body Bathing: Min guard;Sitting   Lower Body Bathing: Moderate assistance;Sitting/lateral leans   Upper Body Dressing : Min guard;Sitting   Lower Body Dressing: Moderate assistance;Sitting/lateral leans Functional mobility during ADLs: Moderate assistance;Rolling walker;Cueing for safety;Cueing for sequencing       Vision Additional Comments: Pt really tired during eval and barely able to keep eyes open          Pertinent Vitals/Pain Pain Assessment: Faces Faces Pain Scale: Hurts worst Pain Location: RLE Pain Descriptors / Indicators: Aching;Guarding;Grimacing Pain Intervention(s): Limited activity within patient's tolerance;Monitored during session;Repositioned     Hand Dominance Right   Extremity/Trunk Assessment Upper Extremity Assessment Upper Extremity Assessment: Overall WFL for tasks assessed   Lower Extremity Assessment Lower Extremity Assessment: Defer to PT evaluation   Cervical / Trunk Assessment Cervical / Trunk Assessment: Normal   Communication Communication Communication: No difficulties   Cognition Arousal/Alertness: Lethargic;Suspect due to medications Behavior During Therapy: Agitated;Anxious Overall Cognitive Status: Within Functional Limits for tasks assessed              Home Living Family/patient expects to be discharged to:: Private residence Living Arrangements: Spouse/significant other Available Help at Discharge: Family;Available PRN/intermittently Type of Home: House Home Access: Stairs to enter [employer] of Steps: 3 Entrance Stairs-Rails: Right;Left;Can reach both Home Layout: One level     Bathroom Shower/Tub: Tub/shower unit;Curtain Shower/tub characteristics: Engineer, building services: Standard     Home Equipment: Wheelchair - [employer] - 2 wheels;Other (comment) (1 crutch)   Prior Functioning/Environment Level of Independence: Independent with  assistive device(s)  Comments: has been using a RW since july    OT Diagnosis: Generalized weakness;Acute pain  OT Problem List: Decreased strength;Decreased range of motion;Decreased activity tolerance;Impaired balance (sitting and/or standing);Decreased safety awareness;Decreased knowledge of use of DME or AE;Decreased knowledge of precautions;Pain   OT Treatment/Interventions: Self-care/ADL training;Therapeutic exercise;Energy conservation;DME and/or AE instruction;Therapeutic activities;Patient/family education;Balance training    OT Goals(Current goals can be found in the care plan section) Acute Rehab OT Goals Patient Stated Goal: go to SNF OT Goal Formulation: With patient Time For Goal Achievement: 03/21/15 Potential to Achieve Goals: Good ADL Goals Pt Will Perform Grooming: with modified independence;sitting Pt Will Perform Lower Body Bathing: sit to/from stand;with supervision Pt Will Perform Lower Body Dressing: sit to/from stand;with supervision Pt Will Transfer to Toilet: with supervision;stand pivot transfer Pt/caregiver will Perform Home Exercise Program: Increased strength;With Supervision;Both right and left upper extremity;With written HEP provided;With theraband  OT Frequency: Min 3X/week   Barriers to D/C: Decreased caregiver support   End of Session Equipment Utilized During Treatment: Rolling walker  Activity Tolerance: Patient limited by lethargy;Patient limited by pain Patient left: in bed;with call bell/phone within reach;with bed alarm set   Time: [id number] OT Time Calculation (min): 16 min Charges:  OT General Charges $OT Visit: 1 Procedure OT Evaluation $OT Eval Moderate Complexity: 1 Procedure  Edwin CapPatricia Hilarie Sinha , MS, OTR/L, CLT Pager: [phone]  03/07/2015, 3:58 PM

## 2015-03-08 MED ORDER — OXYCODONE HCL ER 10 MG PO T12A
20.0000 mg | EXTENDED_RELEASE_TABLET | Freq: Two times a day (BID) | ORAL | Status: DC
  Administered 2015-03-08: 20 mg via ORAL
  Filled 2015-03-08: qty 2

## 2015-03-08 NOTE — Progress Notes (Signed)
NURSING PROGRESS NOTE  Monique [name] [id number] Discharge Data: 03/08/2015 2:27 PM Attending Provider: Richarda OverlieNayana Abrol, MD PCP:[name] Patient     [name] to be D/C'd Home per MD order.  Discussed with the patient the After Visit Summary and all questions fully answered. All IV's discontinued with no bleeding noted. All belongings sent with patient to take home.   Last Vital Signs:  Blood pressure 104/53, pulse 91, temperature 98.5 F (36.9 C), temperature source Oral, resp. rate 18, height 5\' 4"  (1.626 m), weight 88.451 kg (195 lb), last menstrual period 02/16/2015, SpO2 93 %.  Discharge Medication List   Medication List    STOP taking these medications        amitriptyline 10 MG tablet  Commonly known as:  ELAVIL     ibuprofen 800 MG tablet  Commonly known as:  ADVIL,MOTRIN     oxyCODONE-acetaminophen 5-325 MG tablet  Commonly known as:  PERCOCET/ROXICET      TAKE these medications        ALPRAZolam 0.5 MG tablet  Commonly known as:  XANAX  Take 1 tablet (0.5 mg total) by mouth 2 (two) times daily. Take 0.5mg  by mouth in the afternoon and take 0.5mg  by mouth at bedtime     buPROPion 100 MG 12 hr tablet  Commonly known as:  WELLBUTRIN SR  Take 1 tablet (100 mg total) by mouth 2 (two) times daily.     gabapentin 300 MG capsule  Commonly known as:  NEURONTIN  Take 300 mg by mouth 3 (three) times daily.     methocarbamol 500 MG tablet  Commonly known as:  ROBAXIN  Take 1 tablet (500 mg total) by mouth every 6 (six) hours as needed for muscle spasms.     nicotine 21 mg/24hr patch  Commonly known as:  NICODERM CQ - dosed in mg/24 hours  Place 1 patch (21 mg total) onto the skin daily.     oxyCODONE 20 mg 12 hr tablet  Commonly known as:  OXYCONTIN  Take 1 tablet (20 mg total) by mouth every 12 (twelve) hours.     Oxycodone HCl 10 MG Tabs  Take 1 tablet (10 mg total) by mouth every 6 (six) hours as needed.     senna-docusate 8.6-50 MG tablet   Commonly known as:  Senokot-S  Take 1 tablet by mouth at bedtime as needed for mild constipation.         Roma KayserStephanie Auden Tatar, RN

## 2015-03-08 NOTE — Progress Notes (Signed)
Spoke with Dr. Lajoyce Cornersuda, states he will be unable to see patient before she discharges today and to make appointment for patient to see him in his office this week. Appointment made for tomorrow at 2:30 pm.

## 2015-03-08 NOTE — [place] FL2 (Signed)
[place] MEDICAID FL2 LEVEL OF CARE SCREENING TOOL     IDENTIFICATION  Patient Name: Monique [name] Birthdate: 04/28/1975 Sex: female Admission Date (Current Location): 03/02/2015  [facility] [place]   Facility and Address:  [facility] [place], [address], [place], [place] [address]      Provider Number: [id number]  Attending Physician Name and Address:  Richarda OverlieNayana Abrol, MD  Relative Name and Phone Number:  N/A    Current Level of Care: Hospital Recommended Level of Care: Skilled Nursing Facility Prior Approval Number:    Date Approved/Denied:   PASRR Number:   [id number]    Discharge Plan: SNF    Current Diagnoses: Patient Active Problem List   Diagnosis Date Noted  . Fever   . Acute osteomyelitis of right foot (HCC)   . Osteomyelitis (HCC) 03/02/2015  . Cellulitis 03/02/2015  . 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 Cellulitis of face   . Cellulitis and abscess 12/17/2014  . Nicotine abuse 12/17/2014  . Anemia, chronic disease 12/17/2014  . Septic arthritis of right ankle (HCC) 10/01/2014  . Septic arthritis of left ankle (HCC) 09/30/2014  . Conjunctivitis, left eye   . Wound infection after surgery 09/29/2014  . Irritation of left eye 09/29/2014  . Wound, surgical, infected 09/29/2014  . Osteomyelitis of right ankle (HCC)   . Acute encephalopathy 08/29/2014  . Drug abuse 08/29/2014  . Cocaine abuse 08/29/2014  . Acute respiratory failure with hypoxia (HCC) 08/29/2014    Orientation RESPIRATION BLADDER Height & Weight    Self, Time, Situation, Place  Normal Continent   195 lbs.  BEHAVIORAL SYMPTOMS/MOOD NEUROLOGICAL BOWEL NUTRITION STATUS   (N/A)  (N/A) Continent  (Please See DC Summary)  AMBULATORY STATUS COMMUNICATION OF NEEDS Skin   Extensive Assist Verbally Surgical wounds (Amputation)                       Personal Care Assistance Level of Assistance  Bathing, Feeding, Dressing Bathing Assistance:  Limited assistance Feeding assistance: Independent Dressing Assistance: Limited assistance     Functional Limitations Info             SPECIAL CARE FACTORS FREQUENCY  PT (By licensed PT)     PT Frequency: 5x/week              Contractures      Additional Factors Info  Code Status, Allergies, Isolation Precautions, Psychotropic Code Status Info: Full Allergies Info: NKA Psychotropic Info: Xanax   Isolation Precautions Info: MRSA     Current Medications (03/08/2015):  This is the current hospital active medication list Current Facility-Administered Medications  Medication Dose Route Frequency Provider Last Rate Last Dose  . acetaminophen (TYLENOL) tablet 650 mg  650 mg Oral Q6H PRN Nadara MustardMarcus Duda V, MD   650 mg at 03/07/15 1630   Or  . acetaminophen (TYLENOL) suppository 650 mg  650 mg Rectal Q6H PRN Nadara MustardMarcus Duda V, MD      . ALPRAZolam Prudy Feeler(XANAX) tablet 0.5 mg  0.5 mg Oral BID Alberteen Samhristopher P Danford, MD   0.5 mg at 03/08/15 0925  . alum & mag hydroxide-simeth (MAALOX/MYLANTA) 200-200-20 MG/5ML suspension 15 mL  15 mL Oral Q4H PRN Osvaldo ShipperGokul Krishnan, MD   15 mL at 03/08/15 0320  . enoxaparin (LOVENOX) injection 40 mg  40 mg Subcutaneous Q24H Richarda OverlieNayana Abrol, MD   40 mg at 03/08/15 0924  . gabapentin (NEURONTIN) capsule 600 mg  600 mg Oral TID  [name], MD   600 mg at 03/08/15 0925  . lactated ringers infusion   Intravenous Continuous Jairo Ben, MD      . methocarbamol (ROBAXIN) tablet 500 mg  500 mg Oral Q6H PRN [name], MD   500 mg at 03/05/15 1312  . metoCLOPramide (REGLAN) tablet 5-10 mg  5-10 mg Oral Q8H PRN [name], MD      . ondansetron [facility]) tablet 4 mg  4 mg Oral Q6H PRN [name], MD       Or  . ondansetron [facility]) injection 4 mg  4 mg Intravenous Q6H PRN [name], MD      . oxyCODONE (Oxy IR/ROXICODONE) immediate release tablet 10 mg  10 mg Oral Q4H PRN Richarda Overlie, MD   10 mg at 03/08/15 0925  . oxyCODONE (OXYCONTIN) 12 hr tablet 20 mg   20 mg Oral Q12H Richarda Overlie, MD   20 mg at 03/08/15 1132  . senna-docusate (Senokot-S) tablet 1 tablet  1 tablet Oral QHS PRN Alberteen Sam, MD         Discharge Medications: Please see discharge summary for a list of discharge medications.  Relevant Imaging Results:  Relevant Lab Results:   Additional Information SSN: [id number] [place], [place]

## 2015-03-08 NOTE — Progress Notes (Signed)
Informed Dr. Lajoyce Cornersuda of patient's complaints of blisters to right stump.

## 2015-03-08 NOTE — Care Management Note (Addendum)
Case Management Note  Patient Details  Name: Monique [name] MRN: [id number] Date of Birth: 11/24/1975  Subjective/Objective:   Patient chose not to go to the SNF in [place] , she wants to go home, [facility], referral called to Lup[facility]Donna [facility] and [facility], soc will begin 24-48 hrs post dc.  Patient will also need rolling walker, [facility].  And ortho tech will bring crutches per order.   Patient was given hospital follow up information, informed her that she could not miss this appt at the [facility] clinic because she was a no show for previous appts.  Patient states she understands.  Patient was discharged.  NCM received call back after patient was discharged and patient asked how she was going to get her meds.  NCM informed her that the three scripts she has are for narcotics and could not assist her with those, if she had some other meds she could have gotten them from [facility] which she has the brochure for.  Patient states she does not have the money to get her narcotics and would like to go to SNF now so that she could get her meds.  NCM informed her of the conversation we had previously which was told to the patient the best option for her would be to go to SNF, she states she knows and would like to go now.  NCM informed her that she was set up with a Child psychotherapistocial Worker [facility] and she will be able to help facilitate this for her.  Patient states she understands.               Action/Plan:   Expected Discharge Date:                  Expected Discharge Plan:  Home w Home Health Services  In-House Referral:  Clinical Social Work  Discharge planning Services  CM Consult  Post Acute Care Choice:  Home Health Choice offered to:  Patient  DME Arranged:    DME Agency:     HH Arranged:    HH Agency:  [facility]  Status of Service:  Completed, signed off  Medicare Important Message Given:    Date Medicare IM  Given:    Medicare IM give by:    Date Additional Medicare IM Given:    Additional Medicare Important Message give by:     If discussed at Long Length of Stay Meetings, dates discussed:    Additional Comments:  Leone Havenaylor, Izea Livolsi Clinton, RN 03/08/2015, 1:54 PM

## 2015-03-08 NOTE — Progress Notes (Signed)
CSW received consult regarding PT recommendation of SNF at discharge.  Patient is refusing SNF.  CSW signing off.   Sonda Coppens LCSWA [phone]   

## 2015-03-08 NOTE — Progress Notes (Signed)
Patient requested pain medication. IV Dilaudid 53m due and upon administration through the side port of primary line, resistance was met on right upper arm IV. RN changed sites and administered the Dilaudid. Patient complained that medication was wasted and she did not feel it going through her IV. RN reassured patient that the medication was administered. Patient also requested for PRN Ativan. RN left room to get medication. Upon returning to patient room, patient was found to be very drowsy, head falling back and talking with slurred speech. RN administered PRN Ativan and left patient to rest. RN will continue to monitor patient.   MErmalinda Memos RN

## 2015-03-08 NOTE — [place] FL2 (Signed)
[place] MEDICAID FL2 LEVEL OF CARE SCREENING TOOL     IDENTIFICATION  Patient Name: Monique [name] Birthdate: 04/01/1975 Sex: female Admission Date (Current Location): 03/02/2015  [facility] [place]   Facility and Address:  [facility] [place], [address], [place], [place] [address]      Provider Number: [id number]  Attending Physician Name and Address:  Richarda OverlieNayana Abrol, MD  Relative Name and Phone Number:  N/A    Current Level of Care: Hospital Recommended Level of Care: Skilled Nursing Facility Prior Approval Number:    Date Approved/Denied:   PASRR Number:    Discharge Plan: SNF    Current Diagnoses: Patient Active Problem List   Diagnosis Date Noted  . Fever   . Acute osteomyelitis of right foot (HCC)   . Osteomyelitis (HCC) 03/02/2015  . Cellulitis 03/02/2015  . 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 Cellulitis of face   . Cellulitis and abscess 12/17/2014  . Nicotine abuse 12/17/2014  . Anemia, chronic disease 12/17/2014  . Septic arthritis of right ankle (HCC) 10/01/2014  . Septic arthritis of left ankle (HCC) 09/30/2014  . Conjunctivitis, left eye   . Wound infection after surgery 09/29/2014  . Irritation of left eye 09/29/2014  . Wound, surgical, infected 09/29/2014  . Osteomyelitis of right ankle (HCC)   . Acute encephalopathy 08/29/2014  . Drug abuse 08/29/2014  . Cocaine abuse 08/29/2014  . Acute respiratory failure with hypoxia (HCC) 08/29/2014    Orientation RESPIRATION BLADDER Height & Weight    Self, Time, Situation, Place  Normal Continent   195 lbs.  BEHAVIORAL SYMPTOMS/MOOD NEUROLOGICAL BOWEL NUTRITION STATUS   (N/A)  (N/A) Continent  (Please See DC Summary)  AMBULATORY STATUS COMMUNICATION OF NEEDS Skin   Extensive Assist Verbally Surgical wounds (Amputation)                       Personal Care Assistance Level of Assistance  Bathing, Feeding, Dressing Bathing Assistance: Limited  assistance Feeding assistance: Independent Dressing Assistance: Limited assistance     Functional Limitations Info             SPECIAL CARE FACTORS FREQUENCY  PT (By licensed PT)     PT Frequency: 5x/week              Contractures      Additional Factors Info  Code Status, Allergies, Isolation Precautions, Psychotropic Code Status Info: Full Allergies Info: NKA Psychotropic Info: Xanax   Isolation Precautions Info: MRSA     Current Medications (03/08/2015):  This is the current hospital active medication list Current Facility-Administered Medications  Medication Dose Route Frequency Provider Last Rate Last Dose  . acetaminophen (TYLENOL) tablet 650 mg  650 mg Oral Q6H PRN Nadara MustardMarcus Duda V, MD   650 mg at 03/07/15 1630   Or  . acetaminophen (TYLENOL) suppository 650 mg  650 mg Rectal Q6H PRN Nadara MustardMarcus Duda V, MD      . ALPRAZolam Prudy Feeler(XANAX) tablet 0.5 mg  0.5 mg Oral BID Alberteen Samhristopher P Danford, MD   0.5 mg at 03/08/15 0925  . alum & mag hydroxide-simeth (MAALOX/MYLANTA) 200-200-20 MG/5ML suspension 15 mL  15 mL Oral Q4H PRN Osvaldo ShipperGokul Krishnan, MD   15 mL at 03/08/15 0320  . enoxaparin (LOVENOX) injection 40 mg  40 mg Subcutaneous Q24H Richarda OverlieNayana Abrol, MD   40 mg at 03/08/15 0924  . gabapentin (NEURONTIN) capsule 600 mg  600 mg Oral TID Nadara MustardMarcus Duda V,  MD   600 mg at 03/08/15 0925  . lactated ringers infusion   Intravenous Continuous Jairo Ben, MD      . methocarbamol (ROBAXIN) tablet 500 mg  500 mg Oral Q6H PRN [name], MD   500 mg at 03/05/15 1312  . metoCLOPramide (REGLAN) tablet 5-10 mg  5-10 mg Oral Q8H PRN [name], MD      . ondansetron [facility]) tablet 4 mg  4 mg Oral Q6H PRN [name], MD       Or  . ondansetron [facility]) injection 4 mg  4 mg Intravenous Q6H PRN [name], MD      . oxyCODONE (Oxy IR/ROXICODONE) immediate release tablet 10 mg  10 mg Oral Q4H PRN Richarda Overlie, MD   10 mg at 03/08/15 0925  . oxyCODONE (OXYCONTIN) 12 hr tablet 20 mg  20 mg  Oral Q12H Richarda Overlie, MD      . senna-docusate (Senokot-S) tablet 1 tablet  1 tablet Oral QHS PRN Alberteen Sam, MD         Discharge Medications: Please see discharge summary for a list of discharge medications.  Relevant Imaging Results:  Relevant Lab Results:   Additional Information SSN: [id number] [place], [place]

## 2015-03-08 NOTE — Discharge Summary (Signed)
Physician Discharge Summary  Monique [name] MRN: [id number] DOB/AGE: 40/10/1975 40 y.o.  PCP: [name] Patient   Admit date: 03/02/2015 Discharge date: 03/08/2015  Discharge Diagnoses:     Principal Problem:   Osteomyelitis (HCC) Active Problems:   Cellulitis   Acute osteomyelitis of right foot (HCC)   Fever osteomyelitis right ankle with retained external fixator status post trans-tibial amputation    Follow-up recommendations Follow-up with PCP in 3-5 days , including all  additional recommended appointments as below Follow-up CBC, CMP in 3-5 days   addendum-patient awaiting SNF placement , as patient does not qualify for home health PT,     Medication List    STOP taking these medications        amitriptyline 10 MG tablet  Commonly known as:  ELAVIL     ibuprofen 800 MG tablet  Commonly known as:  ADVIL,MOTRIN     oxyCODONE-acetaminophen 5-325 MG tablet  Commonly known as:  PERCOCET/ROXICET      TAKE these medications        ALPRAZolam 0.5 MG tablet  Commonly known as:  XANAX  Take 1 tablet (0.5 mg total) by mouth 2 (two) times daily. Take 0.5mg  by mouth in the afternoon and take 0.5mg  by mouth at bedtime     buPROPion 100 MG 12 hr tablet  Commonly known as:  WELLBUTRIN SR  Take 1 tablet (100 mg total) by mouth 2 (two) times daily.     gabapentin 300 MG capsule  Commonly known as:  NEURONTIN  Take 300 mg by mouth 3 (three) times daily.     methocarbamol 500 MG tablet  Commonly known as:  ROBAXIN  Take 1 tablet (500 mg total) by mouth every 6 (six) hours as needed for muscle spasms.     nicotine 21 mg/24hr patch  Commonly known as:  NICODERM CQ - dosed in mg/24 hours  Place 1 patch (21 mg total) onto the skin daily.     oxyCODONE 20 mg 12 hr tablet  Commonly known as:  OXYCONTIN  Take 1 tablet (20 mg total) by mouth every 12 (twelve) hours.     Oxycodone HCl 10 MG Tabs  Take 1 tablet (10 mg total) by mouth every 6 (six) hours as needed.      senna-docusate 8.6-50 MG tablet  Commonly known as:  Senokot-S  Take 1 tablet by mouth at bedtime as needed for mild constipation.         Discharge Condition: Stable   Discharge Instructions   Discharge Instructions    Diet - low sodium heart healthy    Complete by:  As directed      Increase activity slowly    Complete by:  As directed            No Known Allergies    Disposition: 01-Home or Self Care   Consults:  Orthopedics     Significant Diagnostic Studies:  Dg Ankle Complete Right  03/02/2015  CLINICAL DATA:  Possible postop infection, ankle surgery last November. Diffuse foot and ankle pain. EXAM: RIGHT ANKLE - COMPLETE 3+ VIEW COMPARISON:  Most recent radiographs 02/16/2015 FINDINGS: External fixator in place. Postsurgical change about the ankle with distal fibular resection, single fibular screw remains. Distal tibial resection with unchanged alignment of the tibial talar articulation, mild apex medial talar tilt. Probable decreased dense in the talar dome from prior exam, however diffuse bony under mineralization is seen. Fragmentation of the medial malleolus remains. Progressive soft tissue edema  from prior. IMPRESSION: 1. Post distal tibial resection and tibial talar fusion with increasing lucency about the talar dome, as well as increased soft tissue edema, concerning for soft tissue infection and osteomyelitis. Distal fibular resection without complication. 2. External fixator remains in place. Electronically Signed   By: Rubye Oaks M.D.   On: 03/02/2015 01:00   Dg Ankle Complete Right  02/16/2015  CLINICAL DATA:  Pain at operative site, status post external fixation one month ago EXAM: RIGHT ANKLE - COMPLETE 3+ VIEW COMPARISON:  02/06/2015 FINDINGS: Three views of the right ankle submitted. Again noted postsurgical changes in distal fibula with partial resection. Stable postsurgical changes question resection distal tibia. Again noted external  fixation material with 2 proximal screws in tibial shaft. A transverse screw is noted passing through calcaneus. There is no significant change in alignment from prior exam. Diffuse soft tissue swelling around the ankle without change from prior exam. Stable bony fragments just medial to distal tibia and anterior to distal tibia seen on lateral view. Lateral view shows irregularity and erosion superior articular surface of the talus. Osteomyelitis cannot be excluded. Clinical correlation is necessary. IMPRESSION: Again noted postsurgical changes in distal fibula with partial resection. Stable postsurgical changes question resection distal tibia. Again noted external fixation material with 2 proximal screws in tibial shaft. A transverse screw is noted passing through calcaneus. There is no significant change in alignment from prior exam. Diffuse soft tissue swelling around the ankle without change from prior exam. Stable bony fragments just medial to distal tibia and anterior to distal tibia seen on lateral view. Lateral view shows irregularity and erosion superior articular surface of the talus. Osteomyelitis cannot be excluded. Clinical correlation is necessary. Electronically Signed   By: Natasha Mead M.D.   On: 02/16/2015 19:46   Dg Ankle Complete Right  02/06/2015  CLINICAL DATA:  Right ankle pain after falling. EXAM: RIGHT ANKLE - COMPLETE 3+ VIEW COMPARISON:  December 31, 2014. FINDINGS: Status post internal fixation of of right lower leg, with proximal screws seen in the mid tibial shaft, and distal screw passing through calcaneus. Severely comminuted fracture of the distal right tibia is noted. There has been surgical resection of the distal fibula. Old healed fracture of distal right fibula is noted. IMPRESSION: Status post internal fixation of severely comminuted distal right tibial fracture. Status post surgical resection of distal fibula. Electronically Signed   By: Lupita Raider, M.D.   On:  02/06/2015 15:25   Dg Chest Port 1 View  03/06/2015  CLINICAL DATA:  Fever. EXAM: PORTABLE CHEST 1 VIEW COMPARISON:  Chest x-rays dated 08/29/2014 and 06/02/2012. FINDINGS: Study is hypoinspiratory with crowding of the perihilar and bibasilar bronchovascular markings. Suspect associated mild atelectasis at each lung base. No significant change compared to the earlier plain film exam of 06/02/2012, therefore, no evidence of pneumonia. Cardiomediastinal silhouette is stable in size and configuration. Osseous and soft tissue structures about the chest are unremarkable. IMPRESSION: Hypoinspiratory changes as described above. No evidence of acute cardiopulmonary abnormality seen. No evidence of pneumonia. Electronically Signed   By: Bary Richard M.D.   On: 03/06/2015 09:46   Dg Foot Complete Right  03/02/2015  CLINICAL DATA:  Diffuse foot and ankle pain, possible postop infection. Surgery last November. EXAM: RIGHT FOOT COMPLETE - 3+ VIEW COMPARISON:  No prior dedicated foot exams. FINDINGS: Ankle findings described with concurrently performed ankle radiographs. Diffuse bony under mineralization. Soft tissue edema about the dorsum of the foot. No bony destructive  change involving the foot. IMPRESSION: Soft tissue edema about the foot.  No acute bony abnormality. Ankle findings described on concurrently performed ankle radiographs. Electronically Signed   By: Rubye Oaks M.D.   On: 03/02/2015 01:02        Filed Weights   03/02/15 0800  Weight: 88.451 kg (195 lb)     Microbiology: No results found for this or any previous visit (from the past 240 hour(s)).     Blood Culture    Component Value Date/Time   SDES ABSCESS 12/19/2014 2147   SDES ABSCESS 12/19/2014 2147   SPECREQUEST RIGHT CHIN PT ON VANCOMYCIN 12/19/2014 2147   SPECREQUEST RIGHT CHIN PT ON VANCOMYCIN 12/19/2014 2147   CULT  12/19/2014 2147    NO ANAEROBES ISOLATED Performed at [facility]    CULT  12/19/2014 2147     MODERATE STAPHYLOCOCCUS AUREUS Note: RIFAMPIN AND GENTAMICIN SHOULD NOT BE USED AS SINGLE DRUGS FOR TREATMENT OF STAPH INFECTIONS. This organism DOES NOT demonstrate inducible Clindamycin resistance in vitro. Performed at [facility]    REPTSTATUS 12/24/2014 FINAL 12/19/2014 2147   REPTSTATUS 12/22/2014 FINAL 12/19/2014 2147      Labs: No results found for this or any previous visit (from the past 48 hour(s)).   Lipid Panel     Component Value Date/Time   CHOL  12/10/2007 0720    157        ATP III CLASSIFICATION:  <200     mg/dL   Desirable  161-096  mg/dL   Borderline High  >=045    mg/dL   High   TRIG 409* 81/19/1478 0720   HDL * 12/10/2007 0720    39 QA FLAGS MODIFIED BY DEMOGRAPHIC UPDATE ON 10/14 AT 1115   CHOLHDL 4.0 12/10/2007 0720   VLDL 31 12/10/2007 0720   LDLCALC  12/10/2007 0720    87        Total Cholesterol/HDL:CHD Risk Coronary Heart Disease Risk Table                     Men   Women  1/2 Average Risk   3.4   3.3     Lab Results  Component Value Date   HGBA1C 5.6 03/02/2015     Lab Results  Component Value Date   [facility]  12/10/2007    87        Total Cholesterol/HDL:CHD Risk Coronary Heart Disease Risk Table                     Men   Women  1/2 Average Risk   3.4   3.3   CREATININE 0.50 03/06/2015     Brief summary 40 y.o. 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 The patient reports about a week of increased right ankle pain, redness, swelling, and drainage. The pain is located on the lateral right ankle and heel, is aching and sharp, and constant, worse with movement. She has had chills, emesis. The patient states that she broke her ankle in August 2013, had a surgical infection immediately after requiring PICC, then was discharged from the care of her orthopedist because of inability to  pay so that she had to "go to the ER every month for a year for [oral] antibiotics", until she started coming to see Dr. [name] here, who operated on her ankle  in August 2016 and then November 2016.   Patient suffered a right trimalleolar fracture and syndesmotic rupture in 10/2013 at [facility], which was complicated by premature weightbearing/non-compliance and then cellulitis treated with 6 weeks IV antibiotics by PICC. She subsequently no-showed her orthopedics appointments, was [facility] for pain/redness/swelling/drainage of her ankle, was discharged from ortho clinic for a time for no-show appointments, and was last seen last May at which time Dr. Samuel Bouche recommended surgery to remove plate/screws followed by IV antibiotics. The patient agreed to surgery, but never followed up. Subsequently, she was found altered and admitted to [facility] with drug overdose.  Dr. [name] removed her ankle hardware at that time, and she was treated with oral IV antibiotics for 12 weeks, which she did not take and was admitted again a month later for osteomyelitis, this time discharged to SNF with PICC, which she appears to have completed. She underwent a fusion and antibiotic bead placement in Nov. 2016. Patient evaluated by Dr. 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 Cellulitis with progressive osteomyelitis of the right ankle:  Has a history of MSSA and remote history of MRSA. Chronic osteomyelitis during the last year. There is no WBC, tachycardia, signs of systemic infection upon admission.   Sepsis syndrome doubted. Initially started on cefazolin Lactic acid 2.13> 0.74, CRP 1.7, HIV nonreactive Dr. [name] performed transtibial amputation 1/6, indication was Abscess ulceration osteomyelitis right ankle with retained external fixator On  Cefazolin 1g q8hrs IV,  Day #6, antibiotics will be discontinued today, as  source of infection removed post amputation Now considering SNF placement    2. Chronic pain:  Takes bupropion or amitriptyline at home. Amitriptyline held because of somnolence -Percocet change to oxycodone 10 mg every 6 hours prn [40 tablets provided], continue gabapentin Also started on OxyContin during this admission for uncontrolled pain,   Written instructions about her current pain regimen given to the patient upon request Noted to have significant narcotic dependence requiring escalating doses resulting in intermittent hypoxia that quickly resolves withincentive spirometry  3. Chronic anxiety: -Continue home alprazolam, received excessive amounts of Ativan upon request  4. Hypokalemia: Repleted  5. Anemia of chronic disease: Stable. Presumed from chronic infection. Hemoglobin stable postoperatively     Discharge Exam:    Blood pressure 104/53, pulse 91, temperature 98.5 F (36.9 C), temperature source Oral, resp. rate 18, height 5\' 4"  (1.626 m), weight 88.451 kg (195 lb), last menstrual period 02/16/2015, SpO2 93 %.  General: No acute respiratory distress Lungs: Clear to auscultation bilaterally without wheezes or crackles Cardiovascular: Regular rate and rhythm without murmur gallop or rub normal S1 and S2 Abdomen: Nontender, nondistended, soft, bowel sounds positive, no rebound, no ascites, no appreciable mass Extremities status post RTLE  amputation, dressing in place     Follow-up Information    Follow up with [facility] On 03/24/2015.   Specialty:  Internal Medicine   Why:  2:15 for hospital follow up.  , You will be seeing [facility] information:   [address] [place] [address] [place] [address] [phone]      Follow up with [name], MD In 2 weeks.   Specialty:  Orthopedic Surgery   Contact information:   [address] [place] [address] [phone]       Signed: Richarda Overlie 03/08/2015,  10:26 AM  Time spent >45 mins   

## 2015-03-08 NOTE — Progress Notes (Signed)
Physical Therapy Treatment Patient Details Name: Monique [name] MRN: [id number] DOB: 12/13/1975 Today's Date: 03/08/2015    History of Present Illness Pt is a 40 y.o. 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    PT Comments    Encouragement needed for patient to participate in PT session. At this time the patient was only willing to attempt standing and that was for limited time. Repeated sit/stand with rwX2, each for approximately 10 seconds. Currently the patient is refusing to attempt ambulation, stating that she is in too much pain. Discussion was had with patient regarding need to progress mobility and that focus on next session will be on ambulation. Patient is in agreement.   Follow Up Recommendations  SNF;Supervision for mobility/OOB     Equipment Recommendations  None recommended by PT    Recommendations for Other Services       Precautions / Restrictions Precautions Precautions: Fall Precaution Comments: discussed knee extension and avoiding prolonged knee flexion while sitting or in bed Restrictions Weight Bearing Restrictions: Yes RLE Weight Bearing: Non weight bearing    Mobility  Bed Mobility Overal bed mobility: Independent Bed Mobility: Supine to Sit     Supine to sit: Independent        Transfers Overall transfer level: Needs assistance Equipment used: Rolling walker (2 wheeled) Transfers: Sit to/from Stand Sit to Stand: Mod assist         General transfer comment: repeat X2, tolerating standing approximately 10 seconds. Encouragement needed to repeat.   Ambulation/Gait             General Gait Details: Patient refusing to attempt, states being in too much pain.    Stairs            Wheelchair Mobility    Modified Rankin (Stroke Patients Only)       Balance Overall balance assessment:  Needs assistance Sitting-balance support: No upper extremity supported Sitting balance-Leahy Scale: Good     Standing balance support: Bilateral upper extremity supported Standing balance-Leahy Scale: Poor Standing balance comment: using rw                    Cognition Arousal/Alertness: Awake/alert Behavior During Therapy: Anxious Overall Cognitive Status: Within Functional Limits for tasks assessed                      Exercises      General Comments        Pertinent Vitals/Pain Pain Assessment: 0-10 Pain Score: 8  Pain Location: RLE Pain Descriptors / Indicators:  (just pain) Pain Intervention(s): Limited activity within patient's tolerance;Monitored during session    Home Living                      Prior Function            PT Goals (current goals can now be found in the care plan section) Acute Rehab PT Goals Patient Stated Goal: go to SNF PT Goal Formulation: With patient Time For Goal Achievement: 03/19/15 Potential to Achieve Goals: Good Progress towards PT goals: Progressing toward goals    Frequency  Min 3X/week    PT Plan Current plan remains appropriate    Co-evaluation             End of Session Equipment Utilized During Treatment: Gait belt Activity Tolerance: Patient limited  by pain Patient left: in bed;with call bell/phone within reach     Time: 1052-1105 PT Time Calculation (min) (ACUTE ONLY): 13 min  Charges:  $Therapeutic Activity: 8-22 mins                    G Codes:      Christiane Ha, PT, CSCS Pager [phone] Office [phone]  03/08/2015, 1:30 PM

## 2015-03-15 ENCOUNTER — Emergency Department (HOSPITAL_COMMUNITY)
Admission: EM | Admit: 2015-03-15 | Discharge: 2015-03-15 | Disposition: A | Discharge status: Home / Self Care | Payer: Medicaid Other | Attending: Emergency Medicine | Admitting: Emergency Medicine

## 2015-03-15 ENCOUNTER — Encounter (HOSPITAL_COMMUNITY): Payer: Self-pay

## 2015-03-15 ENCOUNTER — Emergency Department (HOSPITAL_COMMUNITY): Payer: Medicaid Other

## 2015-03-15 DIAGNOSIS — Z79891 Long term (current) use of opiate analgesic: Secondary | ICD-10-CM | POA: Insufficient documentation

## 2015-03-15 DIAGNOSIS — Z79899 Other long term (current) drug therapy: Secondary | ICD-10-CM | POA: Insufficient documentation

## 2015-03-15 DIAGNOSIS — Y658 Other specified misadventures during surgical and medical care: Secondary | ICD-10-CM | POA: Insufficient documentation

## 2015-03-15 DIAGNOSIS — G546 Phantom limb syndrome with pain: Secondary | ICD-10-CM | POA: Diagnosis present

## 2015-03-15 DIAGNOSIS — Z3202 Encounter for pregnancy test, result negative: Secondary | ICD-10-CM | POA: Insufficient documentation

## 2015-03-15 DIAGNOSIS — J45909 Unspecified asthma, uncomplicated: Secondary | ICD-10-CM | POA: Insufficient documentation

## 2015-03-15 DIAGNOSIS — F17210 Nicotine dependence, cigarettes, uncomplicated: Secondary | ICD-10-CM | POA: Insufficient documentation

## 2015-03-15 DIAGNOSIS — F1910 Other psychoactive substance abuse, uncomplicated: Secondary | ICD-10-CM | POA: Diagnosis present

## 2015-03-15 DIAGNOSIS — T879 Unspecified complications of amputation stump: Secondary | ICD-10-CM | POA: Insufficient documentation

## 2015-03-15 DIAGNOSIS — Z89521 Acquired absence of right knee: Secondary | ICD-10-CM

## 2015-03-15 DIAGNOSIS — F419 Anxiety disorder, unspecified: Secondary | ICD-10-CM | POA: Diagnosis not present

## 2015-03-15 DIAGNOSIS — E872 Acidosis: Secondary | ICD-10-CM

## 2015-03-15 DIAGNOSIS — G43909 Migraine, unspecified, not intractable, without status migrainosus: Secondary | ICD-10-CM | POA: Insufficient documentation

## 2015-03-15 DIAGNOSIS — Z8739 Personal history of other diseases of the musculoskeletal system and connective tissue: Secondary | ICD-10-CM | POA: Diagnosis not present

## 2015-03-15 DIAGNOSIS — G8929 Other chronic pain: Secondary | ICD-10-CM | POA: Insufficient documentation

## 2015-03-15 DIAGNOSIS — Z8619 Personal history of other infectious and parasitic diseases: Secondary | ICD-10-CM | POA: Diagnosis not present

## 2015-03-15 DIAGNOSIS — Z89511 Acquired absence of right leg below knee: Secondary | ICD-10-CM

## 2015-03-15 DIAGNOSIS — F319 Bipolar disorder, unspecified: Secondary | ICD-10-CM | POA: Diagnosis not present

## 2015-03-15 DIAGNOSIS — T8789 Other complications of amputation stump: Secondary | ICD-10-CM | POA: Diagnosis not present

## 2015-03-15 DIAGNOSIS — Z8614 Personal history of Methicillin resistant Staphylococcus aureus infection: Secondary | ICD-10-CM | POA: Insufficient documentation

## 2015-03-15 DIAGNOSIS — R7989 Other specified abnormal findings of blood chemistry: Secondary | ICD-10-CM | POA: Diagnosis present

## 2015-03-15 DIAGNOSIS — D638 Anemia in other chronic diseases classified elsewhere: Secondary | ICD-10-CM | POA: Diagnosis present

## 2015-03-15 DIAGNOSIS — R52 Pain, unspecified: Secondary | ICD-10-CM | POA: Diagnosis present

## 2015-03-15 DIAGNOSIS — Z8719 Personal history of other diseases of the digestive system: Secondary | ICD-10-CM | POA: Insufficient documentation

## 2015-03-15 LAB — CBC WITH DIFFERENTIAL/PLATELET
BASOS PCT: 0 %
Basophils Absolute: 0 10*3/uL (ref 0.0–0.1)
EOS ABS: 0 10*3/uL (ref 0.0–0.7)
EOS PCT: 0 %
HCT: 30.5 % — ABNORMAL LOW (ref 36.0–46.0)
HEMOGLOBIN: 8.7 g/dL — AB (ref 12.0–15.0)
Lymphocytes Relative: 41 %
Lymphs Abs: 2.1 10*3/uL (ref 0.7–4.0)
MCH: 22.6 pg — ABNORMAL LOW (ref 26.0–34.0)
MCHC: 28.5 g/dL — AB (ref 30.0–36.0)
MCV: 79.2 fL (ref 78.0–100.0)
MONOS PCT: 9 %
Monocytes Absolute: 0.5 10*3/uL (ref 0.1–1.0)
NEUTROS PCT: 50 %
Neutro Abs: 2.6 10*3/uL (ref 1.7–7.7)
PLATELETS: 444 10*3/uL — AB (ref 150–400)
RBC: 3.85 MIL/uL — ABNORMAL LOW (ref 3.87–5.11)
RDW: 16.3 % — AB (ref 11.5–15.5)
WBC: 5.2 10*3/uL (ref 4.0–10.5)

## 2015-03-15 LAB — I-STAT BETA HCG BLOOD, ED (MC, WL, AP ONLY): I-stat hCG, quantitative: <5 m[IU]/mL (ref <5)

## 2015-03-15 LAB — COMPREHENSIVE METABOLIC PANEL
ALK PHOS: 59 U/L (ref 38–126)
ALT: 16 U/L (ref 14–54)
AST: 21 U/L (ref 15–41)
Albumin: 3.2 g/dL — ABNORMAL LOW (ref 3.5–5.0)
Anion gap: 10 (ref 5–15)
BUN: 7 mg/dL (ref 6–20)
CALCIUM: 9.2 mg/dL (ref 8.9–10.3)
CO2: 23 mmol/L (ref 22–32)
CREATININE: 0.64 mg/dL (ref 0.44–1.00)
Chloride: 106 mmol/L (ref 101–111)
Glucose, Bld: 156 mg/dL — ABNORMAL HIGH (ref 65–99)
Potassium: 3.4 mmol/L — ABNORMAL LOW (ref 3.5–5.1)
SODIUM: 139 mmol/L (ref 135–145)
Total Bilirubin: 0.5 mg/dL (ref 0.3–1.2)
Total Protein: 8.7 g/dL — ABNORMAL HIGH (ref 6.5–8.1)

## 2015-03-15 LAB — I-STAT CG4 LACTIC ACID, ED
LACTIC ACID, VENOUS: 2.67 mmol/L — AB (ref 0.5–2.0)
LACTIC ACID, VENOUS: 0.92 mmol/L (ref 0.5–2.0)

## 2015-03-15 MED ORDER — HYDROMORPHONE HCL 1 MG/ML IJ SOLN
1.0000 mg | Freq: Once | INTRAMUSCULAR | Status: AC
  Administered 2015-03-15: 1 mg via INTRAVENOUS
  Filled 2015-03-15: qty 1

## 2015-03-15 MED ORDER — VANCOMYCIN HCL IN DEXTROSE 1-5 GM/200ML-% IV SOLN
1000.0000 mg | Freq: Once | INTRAVENOUS | Status: AC
  Administered 2015-03-15: 1000 mg via INTRAVENOUS
  Filled 2015-03-15: qty 200

## 2015-03-15 MED ORDER — SODIUM CHLORIDE 0.9 % IV BOLUS (SEPSIS)
30.0000 mL/kg | Freq: Once | INTRAVENOUS | Status: AC
  Administered 2015-03-15: 2655 mL via INTRAVENOUS

## 2015-03-15 MED ORDER — OXYCODONE HCL ER 20 MG PO T12A: 20.0000 mg | EXTENDED_RELEASE_TABLET | Freq: Two times a day (BID) | ORAL | Status: DC

## 2015-03-15 NOTE — ED Provider Notes (Signed)
  Face-to-face evaluation   History: She complains of pain and swelling in the stump of her right lower leg.  Physical exam: Right lower leg, stump from BKA surgery, has sutures intact in the wound, mild necrosis of the wound edges, with mild swelling of the distal aspect, distal, associated with some superficial blisters. No proximal streaking.  Medical screening examination/treatment/procedure(s) were conducted as a shared visit with non-physician practitioner(s) and myself.  I personally evaluated the patient during the encounter  Mancel Bale, MD 03/18/15 1530

## 2015-03-15 NOTE — Discharge Instructions (Signed)
You were seen today for pain around the site of your wound. No current infection found. Take pain medication as prescribed. Keep limb elevated. Contact Dr. Audrie Lia office to be seen this week. Return to the ED if you experience severe worsening of your symptoms, fever, increased drainage, redness or swelling around wound, vomiting.

## 2015-03-15 NOTE — ED Notes (Signed)
Patient transported to X-ray 

## 2015-03-15 NOTE — ED Notes (Signed)
Patient here with right BKA of right leg 1 week ago, now having chills and sweats. Drainage from wound and patient reports taking antibiotics

## 2015-03-15 NOTE — ED Provider Notes (Signed)
CSN: [id number]     Arrival date & time 03/15/15  1056 History   First MD Initiated Contact with Patient 03/15/15 1154     Chief Complaint  Patient presents with  . post amputation/fever and chills      (Consider location/radiation/quality/duration/timing/severity/associated sxs/prior Treatment) HPI   Monique [name] is a 40 y.o F with a pmhx of  recent right BKA, osteomyelitis, who presents to the ED today c/o subjective fevers, chills and wound drainage. Pt had R BKA performed on 03/04/15 secondary to osteomyelitis of right ankle. Pt acquired osteomyelitis in her R ankle after an eternal fixator was placed to correct a trimalleolar fracture. Pt was non-compliant with taking antibiotics at that time. Pt states that she has been doing well since her surgery until 2 days ago when she began experiencing subjective fevers, night sweats and chills. Pt is also having increased drainage from wound and phantom pain. Pt has been taking home oxycontin with no relief. Denies dizziness, syncope, abdominal pain, vomiting, back pain. Denies recreational drug use.   Past Medical History  Diagnosis Date  . Bipolar disorder (HCC)   . 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 GERD (gastroesophageal reflux disease)   . Nerve damage of right foot   . Arthritis   . MRSA infection   . Migraine     "weekly" (12/17/2014)  . Chronic back pain     "middle and lower" (12/17/2014)  . Anxiety   . Depression   . Infection right ankle  . Asthma     PHM   Past Surgical History  Procedure Laterality Date  . Tubal ligation    . Hardware removal Right 09/02/2014    Procedure: Removal Right Lateral Ankle Hardware, Place Antibiotic Bead ;  Surgeon: [name], MD;  Location: [facility];  Service: Orthopedics;  Laterality: Right;  . Ankle fracture surgery Right 82016  . Fracture surgery    . Dilation and curettage of uterus  X 2  . Incision and drainage abscess Right 12/19/2014    Procedure: INCISION AND DRAINAGE  OF ABSCESS ON RIGHT SIDE OF CHIN;  Surgeon: [name], MD;  Location: [facility] OR;  Service: ENT;  Laterality: Right;  . Ankle fusion Right 01/19/2015    Procedure: Right Tibiotalar Fusion, Place Antibiotic Beads;  Surgeon: [name], MD;  Location: [facility] OR;  Service: Orthopedics;  Laterality: Right;  . External fixation leg Right 01/19/2015    Procedure: EXTERNAL FIXATION Right Ankle;  Surgeon: [name], MD;  Location: [facility] OR;  Service: Orthopedics;  Laterality: Right;  . Amputation Right 03/04/2015    Procedure: Right Below Knee Amputation;  Surgeon: [name], MD;  Location: [facility] OR;  Service: Orthopedics;  Laterality: Right;  . External fixation removal Right 03/04/2015    Procedure: REMOVAL EXTERNAL FIXATION Right Lower Leg;  Surgeon: [name], MD;  Location: [facility] OR;  Service: Orthopedics;  Laterality: Right;   Family History  Problem Relation Age of Onset  . Lung cancer Mother   . Cancer Maternal Uncle   . Cancer Maternal Grandfather   . Cancer Paternal Grandfather    Social History  Substance Use Topics  . Smoking status: Current Every Day Smoker -- 0.50 packs/day for 25 years    Types: Cigarettes  . Smokeless tobacco: Never Used  . Alcohol Use: Yes     Comment:  " 7 months ago " 01/18/15   OB History    No data available  Review of Systems  All other systems reviewed and are negative.     Allergies  Review of patient's allergies indicates no known allergies.  Home Medications   Prior to Admission medications   Medication Sig Start Date End Date Taking? Authorizing Provider  ALPRAZolam Prudy Feeler) 0.5 MG tablet Take 1 tablet (0.5 mg total) by mouth 2 (two) times daily. Take 0.5mg  by mouth in the afternoon and take 0.5mg  by mouth at bedtime 03/07/15   Richarda Overlie, MD  buPROPion (WELLBUTRIN SR) 100 MG 12 hr tablet Take 1 tablet (100 mg total) by mouth 2 (two) times daily. Patient not taking: Reported on 02/16/2015 12/22/14   Catarina Hartshorn, MD  gabapentin (NEURONTIN)  300 MG capsule Take 300 mg by mouth 3 (three) times daily.    Historical Provider, MD  methocarbamol (ROBAXIN) 500 MG tablet Take 1 tablet (500 mg total) by mouth every 6 (six) hours as needed for muscle spasms. Patient not taking: Reported on 02/16/2015 01/20/15   Cammy Copa, MD  nicotine (NICODERM CQ - DOSED IN MG/24 HOURS) 21 mg/24hr patch Place 1 patch (21 mg total) onto the skin daily. Patient not taking: Reported on 12/16/2014 10/04/14   Catarina Hartshorn, MD  oxyCODONE (OXYCONTIN) 20 mg 12 hr tablet Take 1 tablet (20 mg total) by mouth every 12 (twelve) hours. 03/07/15   Richarda Overlie, MD  Oxycodone HCl 10 MG TABS Take 1 tablet (10 mg total) by mouth every 6 (six) hours as needed. 03/07/15   Richarda Overlie, MD  senna-docusate (SENOKOT-S) 8.6-50 MG tablet Take 1 tablet by mouth at bedtime as needed for mild constipation. 03/06/15   Richarda Overlie, MD   BP 129/88 mmHg  Pulse 96  Temp(Src) 99.5 F (37.5 C) (Rectal)  Resp 18  SpO2 100%  LMP 02/16/2015 Physical Exam  Constitutional: She is oriented to person, place, and time. She appears well-developed and well-nourished. No distress.  HENT:  Head: Normocephalic and atraumatic.  Mouth/Throat: No oropharyngeal exudate.  Eyes: Conjunctivae and EOM are normal. Pupils are equal, round, and reactive to light. Right eye exhibits no discharge. Left eye exhibits no discharge. No scleral icterus.  Cardiovascular: Normal rate, regular rhythm, normal heart sounds and intact distal pulses.  Exam reveals no gallop and no friction rub.   No murmur heard. Pulmonary/Chest: Effort normal and breath sounds normal. No respiratory distress. She has no wheezes. She has no rales. She exhibits no tenderness.  Abdominal: Soft. She exhibits no distension. There is no tenderness. There is no guarding.  Musculoskeletal: Normal range of motion. She exhibits edema and tenderness.  Right BKA. Soft tissue necrosis present around incision site. Superficial blistering present and  edema. Warm to the touch. No proximal streaking. Significant TTP.   Neurological: She is alert and oriented to person, place, and time.  Skin: Skin is warm and dry. No rash noted. She is not diaphoretic. No erythema. No pallor.  Psychiatric: She has a normal mood and affect. Her behavior is normal.  Nursing note and vitals reviewed.   ED Course  Procedures (including critical care time) Labs Review Labs Reviewed  COMPREHENSIVE METABOLIC PANEL - Abnormal; Notable for the following:    Potassium 3.4 (*)    Glucose, Bld 156 (*)    Total Protein 8.7 (*)    Albumin 3.2 (*)    All other components within normal limits  CBC WITH DIFFERENTIAL/PLATELET - Abnormal; Notable for the following:    RBC 3.85 (*)    Hemoglobin 8.7 (*)  HCT 30.5 (*)    MCH 22.6 (*)    MCHC 28.5 (*)    RDW 16.3 (*)    Platelets 444 (*)    All other components within normal limits  I-STAT CG4 LACTIC ACID, ED - Abnormal; Notable for the following:    Lactic Acid, Venous 2.67 (*)    All other components within normal limits  URINE CULTURE  CULTURE, BLOOD (ROUTINE X 2)  CULTURE, BLOOD (ROUTINE X 2)  URINALYSIS, ROUTINE W REFLEX MICROSCOPIC (NOT AT [facility])  I-STAT BETA HCG BLOOD, ED ([facility], [facility], [facility] ONLY)    Imaging Review Dg Chest 2 View  03/15/2015  CLINICAL DATA:  Fever and cough for 2 days. EXAM: CHEST  2 VIEW COMPARISON:  March 06, 2015 FINDINGS: The heart size and mediastinal contours are within normal limits. Both lungs are clear. The visualized skeletal structures are unremarkable. IMPRESSION: No active cardiopulmonary disease. Electronically Signed   By: Gerome Sam III M.D   On: 03/15/2015 12:23   I have personally reviewed and evaluated these images and lab results as part of my medical decision-making.   EKG Interpretation None      MDM   Final diagnoses:  BKA stump complication (HCC)    40 y.o F with a pmhx of recent BKA on 03/04/15, polysubstance abuse presents for post op subjective fevers,  chills and phantom pain. On presentation, pt is afebrile. Otherwise appears well. Soft tissue necrosis present around incision site with superficial blistering. No proximal streaking. Pt given 1g vancomycin. Pt states that she completed her home abx. No leukocytosis present. Lactic acid elevated at 2.67. Pt given 38ml/kg IV fluids. Blood cultures collected. Spoke with Dr. [name] who performed pts amputation. He recommends admission for IV abx. He is currently out of town and will be back on Friday. He states that he may need to take her back to the OR at that time. Pain managed in ED.   Spoke with hospitalist who will consult pt in ED.   Hospitalist consulted pt in ED and spoke with Dr. Ophelia Charter who is on call for Dr. [name]. They do not feel that pt meets inpatient criteria at this time. Pt has follow up appointment with Dr. [name] on 03/21/15. She is instructed to follow up with Dr. Ophelia Charter sooner if symptoms worsen. See hospitalist note for reference. Will refill pts short term pain medication as recommended by hospitalist.   Repeat lactic acid wnl after fluids.  Pt discharged in stable condition. Patient was discussed with and seen by Dr. Effie Shy who agrees with the treatment plan.      Lester Kinsman [place], PA-C 03/17/15 1754  Mancel Bale, MD 03/18/15 1530

## 2015-03-15 NOTE — Consult Note (Signed)
[facility] Consultation Note                                                                                    Monique [name], is a 40 y.o. female  MRN: [id number]   DOB - [birth year]  Admit Date - 03/15/2015  Outpatient Primary MD for the patient is [name] Patient  Referring MD: Effie Shy / ER  PMH: Past Medical History  Diagnosis Date  . Bipolar disorder (HCC)   . 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 GERD (gastroesophageal reflux disease)   . Nerve damage of right foot   . Arthritis   . MRSA infection   . Migraine     "weekly" (12/17/2014)  . Chronic back pain     "middle and lower" (12/17/2014)  . Anxiety   . Depression   . Infection right ankle  . Asthma     PHM      PSH: Past Surgical History  Procedure Laterality Date  . Tubal ligation    . Hardware removal Right 09/02/2014    Procedure: Removal Right Lateral Ankle Hardware, Place Antibiotic Bead ;  Surgeon: [name], MD;  Location: [facility] ORS;  Service: Orthopedics;  Laterality: Right;  . Ankle fracture surgery Right 82016  . Fracture surgery    . Dilation and curettage of uterus  X 2  . Incision and drainage abscess Right 12/19/2014    Procedure: INCISION AND DRAINAGE OF ABSCESS ON RIGHT SIDE OF CHIN;  Surgeon: [name], MD;  Location: [facility] OR;  Service: ENT;  Laterality: Right;  . Ankle fusion Right 01/19/2015    Procedure: Right Tibiotalar Fusion, Place Antibiotic Beads;  Surgeon: [name], MD;  Location: [facility] OR;  Service: Orthopedics;  Laterality: Right;  . External fixation leg Right 01/19/2015    Procedure: EXTERNAL FIXATION Right Ankle;  Surgeon: [name], MD;  Location: [facility] OR;  Service: Orthopedics;  Laterality: Right;  . Amputation Right 03/04/2015    Procedure: Right Below Knee Amputation;  Surgeon: [name], MD;  Location: [facility] OR;  Service: Orthopedics;  Laterality: Right;  . External fixation removal Right 03/04/2015    Procedure: REMOVAL EXTERNAL FIXATION Right Lower  Leg;  Surgeon: [name], MD;  Location: [facility] OR;  Service: Orthopedics;  Laterality: Right;     CC:  Chief Complaint  Patient presents with  . post amputation/fever and chills      HPI: 40 year old female patient recently discharged on 1/10 after admission for osteomyelitis right ankle with retained external fixator status post transtibial amputation last admission. Skilled nursing facility was recommended at discharge but patient refused. She presented to the ER today with complaints of uncontrolled pain and concerns for possible infection and drainage at the amputation site. In review of discharge medications patient was not prescribed antibiotics at time of discharge by the [facility] although apparently the patient was given a separate prescription by Dr. [name] prior to discharge. She describes these as large blue pills. In addition she reports she had been prescribed a "little pink pill whose name ends in HCL and was 10 mg. I  googled it and it said it supposed to be for neuropathic pain". In review of patient's discharge medication sheet the only medication that ends in HCL was oxycodone HCl 10 mg tablets. Patient reports she is nearly out of this medication. She reports that her gabapentin that she takes has not been helping her phantom limb pain; Lyrica has worked well in the past but she cannot afford it. She reports she is still not completed the Medicaid application process. She reports home health PT has been ordered and states they told her that she would need additional rehabilitative therapies and Dr. Audrie Lia office is in the process of assisting. They are primarily come into the home for wound assessment and management.  ER Evaluation and treatment: Afebrile, vital signs stable, room air saturations 100% 2 view chest x-ray: No active disease 2 view right tibia and fibula: No evidence of osteomyelitis Initial lactic acid was 2.67 and decreased to 0.92 after  hydration Potassium slightly low at 3.4 Functional function normal Glucose 156 WBCs 5200 with neutrophils 50% Hemoglobin 8.7 Platelets 444,000 Blood cultures obtained Per ER report they were able to express some clear yellow fluid from one of the vesicular formations on the wound Normal saline IV bolus 2655 mL Dilaudid 1 mg IV 1 Vancomycin thousand grams IV 1 Dilaudid 1 mg IV 1  Review of Systems   In addition to the HPI above,  No Fever, myalgias or other constitutional symptoms No Headache, changes with Vision or hearing, new weakness, tingling, numbness in any extremity, No problems swallowing food or Liquids, indigestion/reflux No Chest pain, Cough or Shortness of Breath, palpitations, orthopnea or DOE No Abdominal pain, N/V; no melena or hematochezia, no dark tarry stools, Bowel movements are regular, No dysuria, hematuria or flank pain No new joints pains-aches No recent weight gain or loss No polyuria, polydypsia or polyphagia,  *A full 10 point Review of Systems was done, except as stated above, all other Review of Systems were negative.  Social History Social History  Substance Use Topics  . Smoking status: Current Every Day Smoker -- 0.50 packs/day for 25 years    Types: Cigarettes  . Smokeless tobacco: Never Used  . Alcohol Use: Yes     Comment:  " 7 months ago " 01/18/15    Resides at: Private residence  Lives with: Alone  Ambulatory status: During the last hospitalization physical therapy recommended skilled nursing facility patient refused; she was discharged with home health PT and a rolling walker   Family History Family History  Problem Relation Age of Onset  . Lung cancer Mother   . Cancer Maternal Uncle   . Cancer Maternal Grandfather   . Cancer Paternal Grandfather      Prior to Admission medications   Medication Sig Start Date End Date Taking? Authorizing Provider  ALPRAZolam Prudy Feeler) 0.5 MG tablet Take 1 tablet (0.5 mg total) by mouth 2  (two) times daily. Take 0.5mg  by mouth in the afternoon and take 0.5mg  by mouth at bedtime 03/07/15  Yes Richarda Overlie, MD  gabapentin (NEURONTIN) 300 MG capsule Take 300 mg by mouth 3 (three) times daily.   Yes Historical Provider, MD  Oxycodone HCl 10 MG TABS Take 1 tablet (10 mg total) by mouth every 6 (six) hours as needed. 03/07/15  Yes Richarda Overlie, MD  buPROPion (WELLBUTRIN SR) 100 MG 12 hr tablet Take 1 tablet (100 mg total) by mouth 2 (two) times daily. Patient not taking: Reported on 02/16/2015 12/22/14   Catarina Hartshorn,  MD  methocarbamol (ROBAXIN) 500 MG tablet Take 1 tablet (500 mg total) by mouth every 6 (six) hours as needed for muscle spasms. Patient not taking: Reported on 02/16/2015 01/20/15   Cammy Copa, MD  nicotine (NICODERM CQ - DOSED IN MG/24 HOURS) 21 mg/24hr patch Place 1 patch (21 mg total) onto the skin daily. Patient not taking: Reported on 12/16/2014 10/04/14   Catarina Hartshorn, MD  oxyCODONE (OXYCONTIN) 20 mg 12 hr tablet Take 1 tablet (20 mg total) by mouth every 12 (twelve) hours. Patient not taking: Reported on 03/15/2015 03/07/15   Richarda Overlie, MD  senna-docusate (SENOKOT-S) 8.6-50 MG tablet Take 1 tablet by mouth at bedtime as needed for mild constipation. Patient not taking: Reported on 03/15/2015 03/06/15   Richarda Overlie, MD    No Known Allergies  Physical Exam  Vitals  Blood pressure 107/50, pulse 85, temperature 98.5 F (36.9 C), temperature source Rectal, resp. rate 24, last menstrual period 02/16/2015, SpO2 99 %.   General:  In no acute distress, appears chronically ill  Psych:  Normal affect, Denies Suicidal or Homicidal ideations, Awake Alert, Oriented X 3. Speech and thought patterns are clear and appropriate, no apparent short term memory deficits  Neuro:   No focal neurological deficits, CN II through XII intact, Strength 5/5 all 4 extremities suffered decreased strength in right lower extremity in setting of recent amputation, Sensation intact all 4  extremities.  ENT:  Ears and Eyes appear Normal, Conjunctivae clear, PER. Moist oral mucosa without erythema or exudates.  Neck:  Supple, No lymphadenopathy appreciated  Respiratory:  Symmetrical chest wall movement, Good air movement bilaterally, CTAB. Room Air  Cardiac:  RRR, No Murmurs, no LE edema noted, no JVD, No carotid bruits, peripheral pulses palpable at 2+  Abdomen:  Positive bowel sounds, Soft, Non tender, Non distended,  No masses appreciated, no obvious hepatosplenomegaly  Skin:  No Cyanosis, Normal Skin Turgor, several areas of eschars and vesicular formation postoperatively (see attached pictures)  Extremities: Asymmetrical with recent right transtibial amputation-see photos below for details-I was unable to express any fluid from the wound during my examination       Data Review  CBC  Recent Labs Lab 03/15/15 1112  WBC 5.2  HGB 8.7*  HCT 30.5*  PLT 444*  MCV 79.2  MCH 22.6*  MCHC 28.5*  RDW 16.3*  LYMPHSABS 2.1  MONOABS 0.5  EOSABS 0.0  BASOSABS 0.0    Chemistries   Recent Labs Lab 03/15/15 1112  NA 139  K 3.4*  CL 106  CO2 23  GLUCOSE 156*  BUN 7  CREATININE 0.64  CALCIUM 9.2  AST 21  ALT 16  ALKPHOS 59  BILITOT 0.5    estimated creatinine clearance is 101.6 mL/min (by C-G formula based on Cr of 0.64).  No results for input(s): TSH, T4TOTAL, T3FREE, THYROIDAB in the last 72 hours.  Invalid input(s): FREET3  Coagulation profile No results for input(s): INR, PROTIME in the last 168 hours.  No results for input(s): DDIMER in the last 72 hours.  Cardiac Enzymes No results for input(s): CKMB, TROPONINI, MYOGLOBIN in the last 168 hours.  Invalid input(s): CK  Invalid input(s): POCBNP  Urinalysis    Component Value Date/Time   COLORURINE AMBER* 08/29/2014 1445   APPEARANCEUR TURBID* 08/29/2014 1445   LABSPEC 1.029 08/29/2014 1445   PHURINE 5.5 08/29/2014 1445   GLUCOSEU NEGATIVE 08/29/2014 1445   HGBUR NEGATIVE  08/29/2014 1445   BILIRUBINUR SMALL* 08/29/2014 1445   KETONESUR  NEGATIVE 08/29/2014 1445   PROTEINUR 100* 08/29/2014 1445   UROBILINOGEN 1.0 08/29/2014 1445   NITRITE POSITIVE* 08/29/2014 1445   LEUKOCYTESUR SMALL* 08/29/2014 1445    Imaging results:   Dg Chest 2 View  03/15/2015  CLINICAL DATA:  Fever and cough for 2 days. EXAM: CHEST  2 VIEW COMPARISON:  March 06, 2015 FINDINGS: The heart size and mediastinal contours are within normal limits. Both lungs are clear. The visualized skeletal structures are unremarkable. IMPRESSION: No active cardiopulmonary disease. Electronically Signed   By: Gerome Sam III M.D   On: 03/15/2015 12:23   Dg Tibia/fibula Right  03/15/2015  CLINICAL DATA:  Patient status post below the knee amputation. Patient pain and fever post amputation. Evaluate for osteomyelitis. EXAM: RIGHT TIBIA AND FIBULA - 2 VIEW COMPARISON:  None. FINDINGS: And PTA margins of the proximal tibia and fibular shafts are smooth no radiographic evidence of osteomyelitis. There is edema in the subcutaneous soft tissues the amputation stump and adjacent visualized leg. Small amount of soft tissue air lies along the incision along the anterior stump. Knee joint is normally spaced and aligned. IMPRESSION: 1. No evidence of osteomyelitis. Electronically Signed   By: Amie Portland M.D.   On: 03/15/2015 13:32   Dg Ankle Complete Right  03/02/2015  CLINICAL DATA:  Possible postop infection, ankle surgery last November. Diffuse foot and ankle pain. EXAM: RIGHT ANKLE - COMPLETE 3+ VIEW COMPARISON:  Most recent radiographs 02/16/2015 FINDINGS: External fixator in place. Postsurgical change about the ankle with distal fibular resection, single fibular screw remains. Distal tibial resection with unchanged alignment of the tibial talar articulation, mild apex medial talar tilt. Probable decreased dense in the talar dome from prior exam, however diffuse bony under mineralization is seen. Fragmentation of  the medial malleolus remains. Progressive soft tissue edema from prior. IMPRESSION: 1. Post distal tibial resection and tibial talar fusion with increasing lucency about the talar dome, as well as increased soft tissue edema, concerning for soft tissue infection and osteomyelitis. Distal fibular resection without complication. 2. External fixator remains in place. Electronically Signed   By: Rubye Oaks M.D.   On: 03/02/2015 01:00   Dg Ankle Complete Right  02/16/2015  CLINICAL DATA:  Pain at operative site, status post external fixation one month ago EXAM: RIGHT ANKLE - COMPLETE 3+ VIEW COMPARISON:  02/06/2015 FINDINGS: Three views of the right ankle submitted. Again noted postsurgical changes in distal fibula with partial resection. Stable postsurgical changes question resection distal tibia. Again noted external fixation material with 2 proximal screws in tibial shaft. A transverse screw is noted passing through calcaneus. There is no significant change in alignment from prior exam. Diffuse soft tissue swelling around the ankle without change from prior exam. Stable bony fragments just medial to distal tibia and anterior to distal tibia seen on lateral view. Lateral view shows irregularity and erosion superior articular surface of the talus. Osteomyelitis cannot be excluded. Clinical correlation is necessary. IMPRESSION: Again noted postsurgical changes in distal fibula with partial resection. Stable postsurgical changes question resection distal tibia. Again noted external fixation material with 2 proximal screws in tibial shaft. A transverse screw is noted passing through calcaneus. There is no significant change in alignment from prior exam. Diffuse soft tissue swelling around the ankle without change from prior exam. Stable bony fragments just medial to distal tibia and anterior to distal tibia seen on lateral view. Lateral view shows irregularity and erosion superior articular surface of the talus.  Osteomyelitis cannot be  excluded. Clinical correlation is necessary. Electronically Signed   By: Natasha Mead M.D.   On: 02/16/2015 19:46   Dg Chest Port 1 View  03/06/2015  CLINICAL DATA:  Fever. EXAM: PORTABLE CHEST 1 VIEW COMPARISON:  Chest x-rays dated 08/29/2014 and 06/02/2012. FINDINGS: Study is hypoinspiratory with crowding of the perihilar and bibasilar bronchovascular markings. Suspect associated mild atelectasis at each lung base. No significant change compared to the earlier plain film exam of 06/02/2012, therefore, no evidence of pneumonia. Cardiomediastinal silhouette is stable in size and configuration. Osseous and soft tissue structures about the chest are unremarkable. IMPRESSION: Hypoinspiratory changes as described above. No evidence of acute cardiopulmonary abnormality seen. No evidence of pneumonia. Electronically Signed   By: Bary Richard M.D.   On: 03/06/2015 09:46   Dg Foot Complete Right  03/02/2015  CLINICAL DATA:  Diffuse foot and ankle pain, possible postop infection. Surgery last November. EXAM: RIGHT FOOT COMPLETE - 3+ VIEW COMPARISON:  No prior dedicated foot exams. FINDINGS: Ankle findings described with concurrently performed ankle radiographs. Diffuse bony under mineralization. Soft tissue edema about the dorsum of the foot. No bony destructive change involving the foot. IMPRESSION: Soft tissue edema about the foot.  No acute bony abnormality. Ankle findings described on concurrently performed ankle radiographs. Electronically Signed   By: Rubye Oaks M.D.   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 Ophelia Charter of orthopedics (on-call for Dr. [name]) -He has also reviewed the attached photographs of the wound -His recommendation was to elevate the stump above the level of the heart to prevent further edema which is contributing to patient's skin  changes postoperatively; he asked me to inform the patient that if she continues to not elevate the leg this will lead to eventual true wound infection that can lead to further amputations -If patient taking antibiotics prior to current evaluation in ER she is to continue -Continue other preadmission medications; recommend refill short-acting pain medications that she is nearly out of -Recommend soft dressing to stop to aid in pain management prevent further skin trauma -Patient is to call Dr. Audrie Lia office and arrange to be seen this week-Dr. [name] to to return from vacation this Friday-patient instructed to cancel February 23 appointment and instead range above recommended appointment -All of the above explained in detail to the patient at the bedside  Active Problems:   Elevated lactic acid level -Resolved after adequate fluid hydration -Patient instructed to increase fluid intake at home    Polysubstance abuse    Anemia, chronic disease    Discharge meds/recommendations: 1) resume preadmission medications including antibiotics patient reports was prescribed by Dr. [name] prior to last discharge on 1/10 2) recommend give patient a prescription refill oxycodone 10 mg 1 tablet every 6 hours prn pain, #30 , 0 RF 3) elevate right stump above the level of heart at all times 4) soft dressing to stump-may use stockinette dressing to minimize further skin trauma from tape 5) call Dr. Audrie Lia office to arrange to be seen this week in follow-up  Discharge disposition: Back to home environment  Time spent in minutes : 60      Maeli Spacek L. ANP on 03/15/2015 at 3:32 PM  You may contact me by going to [email or web address] - password [email or web address]  I am available from 7a-7p but please confirm I am on the schedule by  going to Amion as above.   After 7p please contact night coverage person covering me after hours  [facility]

## 2015-03-20 LAB — CULTURE, BLOOD (ROUTINE X 2)
CULTURE: NO GROWTH
CULTURE: NO GROWTH

## 2015-03-24 ENCOUNTER — Ambulatory Visit (INDEPENDENT_AMBULATORY_CARE_PROVIDER_SITE_OTHER): Payer: Self-pay | Admitting: Family Medicine

## 2015-03-24 ENCOUNTER — Encounter: Payer: Self-pay | Admitting: Family Medicine

## 2015-03-24 VITALS — BP 118/66 | HR 91 | Temp 98.2°F | Ht 63.0 in | Wt 184.0 lb

## 2015-03-24 DIAGNOSIS — F411 Generalized anxiety disorder: Secondary | ICD-10-CM

## 2015-03-24 DIAGNOSIS — Z89511 Acquired absence of right leg below knee: Secondary | ICD-10-CM

## 2015-03-24 DIAGNOSIS — Z0000 Encounter for general adult medical examination without abnormal findings: Secondary | ICD-10-CM

## 2015-03-24 LAB — CBC WITH DIFFERENTIAL/PLATELET
Basophils Absolute: 0 10*3/uL (ref 0.0–0.1)
Basophils Relative: 0 % (ref 0–1)
EOS ABS: 0.1 10*3/uL (ref 0.0–0.7)
EOS PCT: 1 % (ref 0–5)
HEMATOCRIT: 31.6 % — AB (ref 36.0–46.0)
Hemoglobin: 9.4 g/dL — ABNORMAL LOW (ref 12.0–15.0)
LYMPHS ABS: 3.4 10*3/uL (ref 0.7–4.0)
LYMPHS PCT: 49 % — AB (ref 12–46)
MCH: 21.6 pg — AB (ref 26.0–34.0)
MCHC: 29.7 g/dL — AB (ref 30.0–36.0)
MCV: 72.6 fL — AB (ref 78.0–100.0)
MONO ABS: 0.6 10*3/uL (ref 0.1–1.0)
MPV: 9.9 fL (ref 8.6–12.4)
Monocytes Relative: 8 % (ref 3–12)
Neutro Abs: 2.9 10*3/uL (ref 1.7–7.7)
Neutrophils Relative %: 42 % — ABNORMAL LOW (ref 43–77)
PLATELETS: 452 10*3/uL — AB (ref 150–400)
RBC: 4.35 MIL/uL (ref 3.87–5.11)
RDW: 16 % — AB (ref 11.5–15.5)
WBC: 6.9 10*3/uL (ref 4.0–10.5)

## 2015-03-24 LAB — COMPLETE METABOLIC PANEL WITH GFR
ALT: 19 U/L (ref 6–29)
AST: 16 U/L (ref 10–30)
Albumin: 3.8 g/dL (ref 3.6–5.1)
Alkaline Phosphatase: 57 U/L (ref 33–115)
BILIRUBIN TOTAL: 0.4 mg/dL (ref 0.2–1.2)
BUN: 13 mg/dL (ref 7–25)
CALCIUM: 9.2 mg/dL (ref 8.6–10.2)
CHLORIDE: 101 mmol/L (ref 98–110)
CO2: 27 mmol/L (ref 20–31)
CREATININE: 0.69 mg/dL (ref 0.50–1.10)
GFR, Est Non African American: >89 mL/min (ref >=60)
Glucose, Bld: 109 mg/dL — ABNORMAL HIGH (ref 65–99)
Potassium: 3.8 mmol/L (ref 3.5–5.3)
Sodium: 136 mmol/L (ref 135–146)
TOTAL PROTEIN: 7.5 g/dL (ref 6.1–8.1)

## 2015-03-24 LAB — LIPID PANEL
Cholesterol: 154 mg/dL (ref 125–200)
HDL: 25 mg/dL — ABNORMAL LOW (ref >=46)
LDL CALC: 79 mg/dL (ref <130)
Total CHOL/HDL Ratio: 6.2 Ratio — ABNORMAL HIGH (ref <=5.0)
Triglycerides: 251 mg/dL — ABNORMAL HIGH (ref <150)
VLDL: 50 mg/dL — AB (ref <30)

## 2015-03-24 LAB — TSH: TSH: 0.598 u[IU]/mL (ref 0.350–4.500)

## 2015-03-24 MED ORDER — GABAPENTIN 300 MG PO CAPS: 300.0000 mg | ORAL_CAPSULE | Freq: Three times a day (TID) | ORAL | Status: DC

## 2015-03-24 MED ORDER — ALPRAZOLAM 0.5 MG PO TABS: 0.5000 mg | ORAL_TABLET | Freq: Two times a day (BID) | ORAL | Status: DC

## 2015-03-24 MED ORDER — METHOCARBAMOL 500 MG PO TABS: 500.0000 mg | ORAL_TABLET | Freq: Four times a day (QID) | ORAL | Status: AC | PRN

## 2015-03-24 MED ORDER — BUPROPION HCL ER (SR) 100 MG PO TB12: 100.0000 mg | ORAL_TABLET | Freq: Two times a day (BID) | ORAL | Status: AC

## 2015-03-24 NOTE — Patient Instructions (Signed)
Contact [facility] about your Xanax and Wellbutrin. Call Dr. [name] this afternoon and let him know I would like for them to take a quick look to see about infection in your stump.

## 2015-03-24 NOTE — Progress Notes (Signed)
Patient ID: Monique [name], female   DOB: [birth year], 40 y.o.   MRN: [id number]   Monique [name], is a 40 y.o. female  [id number]:[id number]  [id number]:[id number]  DOB - [birth year]  CC:  Chief Complaint  Patient presents with  . Establish Care    f/u hospital visit,  needs reflls on meds, especially pain meds.       HPI: Monique [name] is a 40 y.o. female here to establish care. 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 She has a history of polysubstance abuse, particularly cocaine. She reports being off drugs for at least 2 years. She reports no other chronic health problems. Her immunizations are up to date. She needs 2nd pneumonia in august. She reports smoking 1 pk/day. She is not ready to quit due to her current anxiety. She is concerned as she feels the area around her wound is redder and more tender over the last couple of days. She does not have an appointment with Dr. [name] fpr 2-3 weeks.  No Known Allergies Past Medical History  Diagnosis Date  . Bipolar disorder (HCC)   . 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 GERD (gastroesophageal reflux disease)   . Nerve damage of right foot   . Arthritis   . MRSA infection   . Migraine     "weekly" (12/17/2014)  . Chronic back pain     "middle and lower" (12/17/2014)  . Anxiety   . Depression   . Infection right ankle  . Asthma     PHM   Current Outpatient Prescriptions on File Prior to Visit  Medication Sig Dispense Refill  . oxyCODONE (OXYCONTIN) 20 mg 12 hr tablet Take 1 tablet (20 mg total) by mouth every 12 (twelve) hours. 30 tablet 0  . Oxycodone HCl 10 MG TABS Take 1 tablet (10 mg total) by mouth every 6 (six) hours as needed. 40 tablet 0  . senna-docusate (SENOKOT-S) 8.6-50 MG tablet Take 1 tablet by mouth at bedtime as needed for mild constipation. 60 tablet 0  . nicotine (NICODERM CQ - DOSED IN MG/24 HOURS) 21 mg/24hr patch Place 1 patch (21 mg total) onto the skin  daily. (Patient not taking: Reported on 12/16/2014) 28 patch 0   No current facility-administered medications on file prior to visit.   Family History  Problem Relation Age of Onset  . Lung cancer Mother   . Cancer Maternal Uncle   . Cancer Maternal Grandfather   . Cancer Paternal Grandfather    Social History   Social History  . Marital Status: Legally Separated    Spouse Name: N/A  . Number of Children: N/A  . Years of Education: N/A   Occupational History  . Not on file.   Social History Main Topics  . Smoking status: Current Every Day Smoker -- 0.50 packs/day for 25 years    Types: Cigarettes  . Smokeless tobacco: Never Used  . Alcohol Use: Yes     Comment:  " 7 months ago " 01/18/15  . Drug Use: Yes    Special: Cocaine, Marijuana, Benzodiazepines     Comment: drug screen +  benzos,cocaine, marijuana pt denies using ( denies curent use 12/17/14  . Sexual Activity: Yes    Birth Control/ Protection: None, Surgical   Other Topics Concern  . Not on file   Social History Narrative   ** Merged History Encounter **  Review of Systems: Constitutional: Negative for fever, chills, appetite change, weight loss,  Fatigue. Skin: Negative for rashes or lesions of concern. She reports occassional small abcess vs. Pimple formation on her buttocks, none at present HENT: Negative for ear pain, ear discharge.nose bleeds Eyes: Negative for pain, discharge, redness, itching and visual disturbance. Neck: Negative for pain, stiffness Respiratory: Negative for cough, shortness of breath,   Cardiovascular: Negative for chest pain, palpitations and leg swelling. Gastrointestinal: Negative for abdominal pain,  vomiting, diarrhea, constipations. Positive for occassional vomiting, occassional constipation and heartburn Genitourinary: Negative for dysuria, urgency, frequency, hematuria,  Musculoskeletal: Negative for back pain, joint pain, joint  swelling, and gait problem.Negative  for weakness.Only pain is the pain in her right stump Neurological: Negative for dizziness, tremors, seizures, syncope,   light-headedness, numbness. Positive for occassional headache Hematological: Negative for easy bruising or bleeding Psychiatric/Behavioral: Negative for depression. Positive for anxiety   Objective:   Filed Vitals:   03/24/15 1427  BP: 118/66  Pulse: 91  Temp: 98.2 F (36.8 C)    Physical Exam: Constitutional: Patient appears well-developed and well-nourished. No distress. HENT: Normocephalic, atraumatic, External right and left ear normal. Oropharynx is clear and moist.  Eyes: Conjunctivae and EOM are normal. PERRLA, no scleral icterus. Neck: Normal ROM. Neck supple. No lymphadenopathy, No thyromegaly. CVS: RRR, S1/S2 +, no murmurs, no gallops, no rubs Pulmonary: Effort and breath sounds normal, no stridor, rhonchi, wheezes, rales.  Abdominal: Soft. Normoactive BS,, no distension, tenderness, rebound or guarding.  Musculoskeletal: Normal range of motion. There is swelling, redness and tenderness around the incision on the stump. Neuro: Alert.Normal muscle tone coordination. Non-focal Skin: Skin is warm and dry. No rash noted. Not diaphoretic. No erythema. No pallor. Psychiatric: Normal mood and affect. Behavior, judgment, thought content normal.  Lab Results  Component Value Date   WBC 5.2 03/15/2015   HGB 8.7* 03/15/2015   HCT 30.5* 03/15/2015   MCV 79.2 03/15/2015   PLT 444* 03/15/2015   Lab Results  Component Value Date   CREATININE 0.64 03/15/2015   BUN 7 03/15/2015   NA 139 03/15/2015   K 3.4* 03/15/2015   CL 106 03/15/2015   CO2 23 03/15/2015    Lab Results  Component Value Date   HGBA1C 5.6 03/02/2015   Lipid Panel     Component Value Date/Time   CHOL  12/10/2007 0720    157        ATP III CLASSIFICATION:  <200     mg/dL   Desirable  960-454  mg/dL   Borderline High  >=098    mg/dL   High   TRIG 119* 14/78/2956 0720   HDL *  12/10/2007 0720    39 QA FLAGS MODIFIED BY DEMOGRAPHIC UPDATE ON 10/14 AT 1115   CHOLHDL 4.0 12/10/2007 0720   VLDL 31 12/10/2007 0720   LDLCALC  12/10/2007 0720    87        Total Cholesterol/HDL:CHD Risk Coronary Heart Disease Risk Table                     Men   Women  1/2 Average Risk   3.4   3.3       Assessment and plan:   1. Healthcare maintenance  - COMPLETE METABOLIC PANEL WITH GFR - CBC w/Diff - TSH - Lipid panel  2. Generalized anxiety disorder -Refil of Xanax  .5 mg, #30 one po bid prn. I have explained I will only be able to  do this once  3. Depression -Refill of Wellbutrin SR 100 mg, one po bid, # 90 with no refills. I prefer that mental health manage this also.  3. Status post below knee amputation of right lower extremity (HCC) - Follow-up with Dr. [name] tomorrow to assess for wound infection  (The appointment is actually with Dr. August Saucer)   Return in about 3 months (around 06/22/2015).  The patient was given clear instructions to go to ER or return to medical center if symptoms don't improve, worsen or new problems develop. The patient verbalized understanding.    Henrietta Hoover FNP  03/24/2015, 3:01 PM

## 2015-03-28 ENCOUNTER — Other Ambulatory Visit (HOSPITAL_COMMUNITY): Payer: Self-pay | Admitting: Family Medicine

## 2015-03-28 MED ORDER — FERROUS SULFATE 325 (65 FE) MG PO TABS: 325.0000 mg | ORAL_TABLET | Freq: Every day | ORAL | Status: AC

## 2015-04-13 ENCOUNTER — Other Ambulatory Visit: Payer: Self-pay | Admitting: Orthopedic Surgery

## 2015-04-13 ENCOUNTER — Encounter (HOSPITAL_COMMUNITY): Payer: Self-pay | Admitting: *Deleted

## 2015-04-13 NOTE — Progress Notes (Signed)
Pt denies cardiac history, chest pain or sob. Denies any recent recreational drug use.

## 2015-04-14 ENCOUNTER — Ambulatory Visit (HOSPITAL_COMMUNITY)
Admission: RE | Admit: 2015-04-14 | Payer: No Typology Code available for payment source | Source: Ambulatory Visit | Admitting: Specialist

## 2015-04-14 ENCOUNTER — Ambulatory Visit (HOSPITAL_COMMUNITY): Payer: Medicaid Other | Admitting: Anesthesiology

## 2015-04-14 ENCOUNTER — Ambulatory Visit (HOSPITAL_COMMUNITY)
Admission: RE | Admit: 2015-04-14 | Discharge: 2015-04-15 | Disposition: A | Discharge status: Home / Self Care | Payer: Medicaid Other | Source: Ambulatory Visit | Attending: Orthopedic Surgery | Admitting: Orthopedic Surgery

## 2015-04-14 ENCOUNTER — Encounter (HOSPITAL_COMMUNITY)
Admission: RE | Disposition: A | Discharge status: Home / Self Care | Payer: Self-pay | Source: Ambulatory Visit | Attending: Orthopedic Surgery

## 2015-04-14 ENCOUNTER — Encounter (HOSPITAL_COMMUNITY): Payer: Self-pay | Admitting: Certified Registered"

## 2015-04-14 ENCOUNTER — Encounter (HOSPITAL_COMMUNITY): Admission: RE | Payer: Self-pay | Source: Ambulatory Visit

## 2015-04-14 DIAGNOSIS — M545 Low back pain: Secondary | ICD-10-CM | POA: Insufficient documentation

## 2015-04-14 DIAGNOSIS — F319 Bipolar disorder, unspecified: Secondary | ICD-10-CM | POA: Diagnosis not present

## 2015-04-14 DIAGNOSIS — Z86718 Personal history of other venous thrombosis and embolism: Secondary | ICD-10-CM | POA: Insufficient documentation

## 2015-04-14 DIAGNOSIS — T8781 Dehiscence of amputation stump: Secondary | ICD-10-CM | POA: Diagnosis present

## 2015-04-14 DIAGNOSIS — G43909 Migraine, unspecified, not intractable, without status migrainosus: Secondary | ICD-10-CM | POA: Diagnosis not present

## 2015-04-14 DIAGNOSIS — G8929 Other chronic pain: Secondary | ICD-10-CM | POA: Diagnosis not present

## 2015-04-14 DIAGNOSIS — F17210 Nicotine dependence, cigarettes, uncomplicated: Secondary | ICD-10-CM | POA: Insufficient documentation

## 2015-04-14 DIAGNOSIS — K219 Gastro-esophageal reflux disease without esophagitis: Secondary | ICD-10-CM | POA: Insufficient documentation

## 2015-04-14 DIAGNOSIS — Z8614 Personal history of Methicillin resistant Staphylococcus aureus infection: Secondary | ICD-10-CM | POA: Diagnosis not present

## 2015-04-14 DIAGNOSIS — L928 Other granulomatous disorders of the skin and subcutaneous tissue: Secondary | ICD-10-CM | POA: Diagnosis not present

## 2015-04-14 DIAGNOSIS — M1990 Unspecified osteoarthritis, unspecified site: Secondary | ICD-10-CM | POA: Insufficient documentation

## 2015-04-14 DIAGNOSIS — Z89511 Acquired absence of right leg below knee: Secondary | ICD-10-CM | POA: Insufficient documentation

## 2015-04-14 DIAGNOSIS — Y835 Amputation of limb(s) as the cause of abnormal reaction of the patient, or of later complication, without mention of misadventure at the time of the procedure: Secondary | ICD-10-CM | POA: Insufficient documentation

## 2015-04-14 DIAGNOSIS — T8740 Infection of amputation stump, unspecified extremity: Secondary | ICD-10-CM | POA: Diagnosis present

## 2015-04-14 DIAGNOSIS — J45909 Unspecified asthma, uncomplicated: Secondary | ICD-10-CM | POA: Diagnosis not present

## 2015-04-14 DIAGNOSIS — Z6832 Body mass index (BMI) 32.0-32.9, adult: Secondary | ICD-10-CM | POA: Insufficient documentation

## 2015-04-14 DIAGNOSIS — M549 Dorsalgia, unspecified: Secondary | ICD-10-CM | POA: Diagnosis not present

## 2015-04-14 DIAGNOSIS — F419 Anxiety disorder, unspecified: Secondary | ICD-10-CM | POA: Diagnosis not present

## 2015-04-14 HISTORY — PX: STUMP REVISION: SHX6102

## 2015-04-14 HISTORY — DX: Personal history of other venous thrombosis and embolism: Z86.718

## 2015-04-14 HISTORY — DX: Anemia, unspecified: D64.9

## 2015-04-14 LAB — CBC
HEMATOCRIT: 34.4 % — AB (ref 36.0–46.0)
HEMOGLOBIN: 9.8 g/dL — AB (ref 12.0–15.0)
MCH: 21.2 pg — AB (ref 26.0–34.0)
MCHC: 28.5 g/dL — AB (ref 30.0–36.0)
MCV: 74.5 fL — AB (ref 78.0–100.0)
Platelets: 442 10*3/uL — ABNORMAL HIGH (ref 150–400)
RBC: 4.62 MIL/uL (ref 3.87–5.11)
RDW: 16.2 % — ABNORMAL HIGH (ref 11.5–15.5)
WBC: 6.8 10*3/uL (ref 4.0–10.5)

## 2015-04-14 LAB — GLUCOSE, CAPILLARY: GLUCOSE-CAPILLARY: 87 mg/dL (ref 65–99)

## 2015-04-14 LAB — HCG, SERUM, QUALITATIVE: Preg, Serum: NEGATIVE

## 2015-04-14 SURGERY — REVISION, AMPUTATION SITE
Anesthesia: General | Laterality: Right

## 2015-04-14 MED ORDER — OXYCODONE HCL 5 MG/5ML PO SOLN: 5.0000 mg | Freq: Once | ORAL | Status: AC | PRN

## 2015-04-14 MED ORDER — CEFAZOLIN SODIUM 1-5 GM-% IV SOLN
1.0000 g | Freq: Four times a day (QID) | INTRAVENOUS | Status: DC
  Administered 2015-04-15 (×2): 1 g via INTRAVENOUS
  Filled 2015-04-14 (×3): qty 50

## 2015-04-14 MED ORDER — ONDANSETRON HCL 4 MG/2ML IJ SOLN
INTRAMUSCULAR | Status: DC | PRN
Start: 2015-04-14 — End: 2015-04-14
  Administered 2015-04-14: 4 mg via INTRAVENOUS

## 2015-04-14 MED ORDER — LIDOCAINE HCL (CARDIAC) 20 MG/ML IV SOLN
INTRAVENOUS | Status: DC | PRN
  Administered 2015-04-14: 50 mg via INTRAVENOUS

## 2015-04-14 MED ORDER — KETOROLAC TROMETHAMINE 15 MG/ML IJ SOLN
15.0000 mg | Freq: Four times a day (QID) | INTRAMUSCULAR | Status: DC
Start: 2015-04-15 — End: 2015-04-15
  Administered 2015-04-15: 15 mg via INTRAVENOUS
  Filled 2015-04-14 (×2): qty 1

## 2015-04-14 MED ORDER — OXYCODONE HCL ER 10 MG PO T12A
20.0000 mg | EXTENDED_RELEASE_TABLET | Freq: Two times a day (BID) | ORAL | Status: DC
Start: 2015-04-14 — End: 2015-04-15
  Administered 2015-04-14 – 2015-04-15 (×2): 20 mg via ORAL
  Filled 2015-04-14 (×2): qty 2

## 2015-04-14 MED ORDER — PROMETHAZINE HCL 25 MG/ML IJ SOLN: 6.2500 mg | INTRAMUSCULAR | Status: DC | PRN

## 2015-04-14 MED ORDER — HYDROMORPHONE HCL 1 MG/ML IJ SOLN
0.5000 mg | INTRAMUSCULAR | Status: DC | PRN
  Administered 2015-04-14 (×2): 0.5 mg via INTRAVENOUS

## 2015-04-14 MED ORDER — HYDROMORPHONE HCL 1 MG/ML IJ SOLN
INTRAMUSCULAR | Status: AC
Start: 2015-04-14 — End: 2015-04-14
  Filled 2015-04-14: qty 1

## 2015-04-14 MED ORDER — HYDROMORPHONE HCL 1 MG/ML IJ SOLN
INTRAMUSCULAR | Status: AC
  Filled 2015-04-14: qty 1

## 2015-04-14 MED ORDER — PROPOFOL 10 MG/ML IV BOLUS
INTRAVENOUS | Status: DC | PRN
  Administered 2015-04-14: 50 mg via INTRAVENOUS
  Administered 2015-04-14: 150 mg via INTRAVENOUS

## 2015-04-14 MED ORDER — METOCLOPRAMIDE HCL 5 MG PO TABS: 5.0000 mg | ORAL_TABLET | Freq: Three times a day (TID) | ORAL | Status: DC | PRN

## 2015-04-14 MED ORDER — CEFAZOLIN SODIUM-DEXTROSE 2-3 GM-% IV SOLR
INTRAVENOUS | Status: AC
  Filled 2015-04-14: qty 50

## 2015-04-14 MED ORDER — ACETAMINOPHEN 650 MG RE SUPP: 650.0000 mg | Freq: Four times a day (QID) | RECTAL | Status: DC | PRN

## 2015-04-14 MED ORDER — SENNOSIDES-DOCUSATE SODIUM 8.6-50 MG PO TABS: 1.0000 | ORAL_TABLET | Freq: Every evening | ORAL | Status: DC | PRN

## 2015-04-14 MED ORDER — GABAPENTIN 300 MG PO CAPS
300.0000 mg | ORAL_CAPSULE | Freq: Three times a day (TID) | ORAL | Status: DC
  Administered 2015-04-14 – 2015-04-15 (×2): 300 mg via ORAL
  Filled 2015-04-14 (×2): qty 1

## 2015-04-14 MED ORDER — METHOCARBAMOL 1000 MG/10ML IJ SOLN
500.0000 mg | Freq: Four times a day (QID) | INTRAVENOUS | Status: DC | PRN
  Filled 2015-04-14: qty 5

## 2015-04-14 MED ORDER — OXYCODONE HCL 5 MG PO TABS
15.0000 mg | ORAL_TABLET | Freq: Once | ORAL | Status: AC | PRN
  Administered 2015-04-14: 15 mg via ORAL

## 2015-04-14 MED ORDER — CEFAZOLIN SODIUM-DEXTROSE 2-3 GM-% IV SOLR
2.0000 g | INTRAVENOUS | Status: AC
  Administered 2015-04-14: 2 g via INTRAVENOUS

## 2015-04-14 MED ORDER — ACETAMINOPHEN 325 MG PO TABS: 650.0000 mg | ORAL_TABLET | Freq: Four times a day (QID) | ORAL | Status: DC | PRN

## 2015-04-14 MED ORDER — HYDROMORPHONE HCL 1 MG/ML IJ SOLN
1.0000 mg | INTRAMUSCULAR | Status: DC | PRN
  Administered 2015-04-14: 1 mg via INTRAVENOUS
  Filled 2015-04-14: qty 1

## 2015-04-14 MED ORDER — OXYCODONE HCL 5 MG PO TABS
10.0000 mg | ORAL_TABLET | Freq: Four times a day (QID) | ORAL | Status: DC | PRN
  Administered 2015-04-15: 10 mg via ORAL
  Filled 2015-04-14 (×3): qty 2

## 2015-04-14 MED ORDER — 0.9 % SODIUM CHLORIDE (POUR BTL) OPTIME
TOPICAL | Status: DC | PRN
  Administered 2015-04-14: 1000 mL

## 2015-04-14 MED ORDER — FENTANYL CITRATE (PF) 100 MCG/2ML IJ SOLN
INTRAMUSCULAR | Status: DC | PRN
  Administered 2015-04-14 (×3): 50 ug via INTRAVENOUS
  Administered 2015-04-14: 100 ug via INTRAVENOUS

## 2015-04-14 MED ORDER — METHOCARBAMOL 500 MG PO TABS: 500.0000 mg | ORAL_TABLET | Freq: Four times a day (QID) | ORAL | Status: DC | PRN

## 2015-04-14 MED ORDER — ONDANSETRON HCL 4 MG/2ML IJ SOLN: 4.0000 mg | Freq: Four times a day (QID) | INTRAMUSCULAR | Status: DC | PRN

## 2015-04-14 MED ORDER — METOCLOPRAMIDE HCL 5 MG/ML IJ SOLN: 5.0000 mg | Freq: Three times a day (TID) | INTRAMUSCULAR | Status: DC | PRN

## 2015-04-14 MED ORDER — BUPROPION HCL ER (SR) 100 MG PO TB12
100.0000 mg | ORAL_TABLET | Freq: Two times a day (BID) | ORAL | Status: DC
  Administered 2015-04-15 (×2): 100 mg via ORAL
  Filled 2015-04-14 (×4): qty 1

## 2015-04-14 MED ORDER — LACTATED RINGERS IV SOLN
INTRAVENOUS | Status: DC | PRN
  Administered 2015-04-14: 18:00:00 via INTRAVENOUS

## 2015-04-14 MED ORDER — OXYCODONE HCL 5 MG PO TABS
ORAL_TABLET | ORAL | Status: AC
  Filled 2015-04-14: qty 1

## 2015-04-14 MED ORDER — MIDAZOLAM HCL 5 MG/5ML IJ SOLN
INTRAMUSCULAR | Status: DC | PRN
  Administered 2015-04-14: 2 mg via INTRAVENOUS

## 2015-04-14 MED ORDER — ONDANSETRON HCL 4 MG PO TABS: 4.0000 mg | ORAL_TABLET | Freq: Four times a day (QID) | ORAL | Status: DC | PRN

## 2015-04-14 MED ORDER — SODIUM CHLORIDE 0.9 % IV SOLN
INTRAVENOUS | Status: DC
  Administered 2015-04-14: 22:00:00 via INTRAVENOUS

## 2015-04-14 MED ORDER — ALPRAZOLAM 0.5 MG PO TABS
0.5000 mg | ORAL_TABLET | Freq: Two times a day (BID) | ORAL | Status: DC
  Administered 2015-04-14 – 2015-04-15 (×3): 0.5 mg via ORAL
  Filled 2015-04-14 (×3): qty 1

## 2015-04-14 MED ORDER — HYDROMORPHONE HCL 1 MG/ML IJ SOLN
0.5000 mg | INTRAMUSCULAR | Status: DC | PRN
  Administered 2015-04-14 (×4): 0.5 mg via INTRAVENOUS

## 2015-04-14 SURGICAL SUPPLY — 40 items
BANDAGE ESMARK 6X9 LF (GAUZE/BANDAGES/DRESSINGS) ×1 IMPLANT
BLADE SAW RECIP 87.9 MT (BLADE) ×2 IMPLANT
BNDG CMPR 9X6 STRL LF SNTH (GAUZE/BANDAGES/DRESSINGS) ×1
BNDG COHESIVE 6X5 TAN STRL LF (GAUZE/BANDAGES/DRESSINGS) ×6 IMPLANT
BNDG ESMARK 6X9 LF (GAUZE/BANDAGES/DRESSINGS) ×3
COVER SURGICAL LIGHT HANDLE (MISCELLANEOUS) ×6 IMPLANT
DRAIN PENROSE 1/2X12 LTX STRL (WOUND CARE) IMPLANT
DRAPE EXTREMITY T 121X128X90 (DRAPE) ×3 IMPLANT
DRAPE PROXIMA HALF (DRAPES) ×6 IMPLANT
DRAPE U-SHAPE 47X51 STRL (DRAPES) ×6 IMPLANT
DRSG ADAPTIC 3X8 NADH LF (GAUZE/BANDAGES/DRESSINGS) ×3 IMPLANT
DURAPREP 26ML APPLICATOR (WOUND CARE) ×3 IMPLANT
ELECT REM PT RETURN 9FT ADLT (ELECTROSURGICAL) ×3
ELECTRODE REM PT RTRN 9FT ADLT (ELECTROSURGICAL) ×1 IMPLANT
EVACUATOR 1/8 PVC DRAIN (DRAIN) IMPLANT
GAUZE SPONGE 4X4 12PLY STRL (GAUZE/BANDAGES/DRESSINGS) ×3 IMPLANT
GLOVE BIOGEL PI IND STRL 9 (GLOVE) ×1 IMPLANT
GLOVE BIOGEL PI INDICATOR 9 (GLOVE) ×2
GLOVE SURG ORTHO 9.0 STRL STRW (GLOVE) ×3 IMPLANT
GOWN STRL REUS W/ TWL XL LVL3 (GOWN DISPOSABLE) ×2 IMPLANT
GOWN STRL REUS W/TWL XL LVL3 (GOWN DISPOSABLE) ×6
KIT BASIN OR (CUSTOM PROCEDURE TRAY) ×3 IMPLANT
KIT ROOM TURNOVER OR (KITS) ×3 IMPLANT
MANIFOLD NEPTUNE II (INSTRUMENTS) ×3 IMPLANT
NS IRRIG 1000ML POUR BTL (IV SOLUTION) ×3 IMPLANT
PACK GENERAL/GYN (CUSTOM PROCEDURE TRAY) ×3 IMPLANT
PAD ARMBOARD 7.5X6 YLW CONV (MISCELLANEOUS) ×6 IMPLANT
PAD NEG PRESSURE SENSATRAC (MISCELLANEOUS) ×2 IMPLANT
PREVENA INCISION MGT 90 150 (MISCELLANEOUS) ×2 IMPLANT
SPONGE LAP 18X18 X RAY DECT (DISPOSABLE) IMPLANT
STAPLER VISISTAT 35W (STAPLE) IMPLANT
STOCKINETTE IMPERVIOUS LG (DRAPES) IMPLANT
SUT PDS AB 1 CT  36 (SUTURE)
SUT PDS AB 1 CT 36 (SUTURE) IMPLANT
SUT SILK 2 0 (SUTURE) ×3
SUT SILK 2-0 18XBRD TIE 12 (SUTURE) ×1 IMPLANT
SUT VIC AB 1 CTX 36 (SUTURE)
SUT VIC AB 1 CTX36XBRD ANBCTR (SUTURE) IMPLANT
TOWEL OR 17X24 6PK STRL BLUE (TOWEL DISPOSABLE) ×3 IMPLANT
TOWEL OR 17X26 10 PK STRL BLUE (TOWEL DISPOSABLE) ×3 IMPLANT

## 2015-04-14 NOTE — Transfer of Care (Signed)
Immediate Anesthesia Transfer of Care Note  Patient: Monique [name]  Procedure(s) Performed: Procedure(s): RIGHT BELOW KNEE AMPUTATION REVISION (Right)  Patient Location: PACU  Anesthesia Type:General  Level of Consciousness: awake  Airway & Oxygen Therapy: Patient Spontanous Breathing and Patient connected to nasal cannula oxygen  Post-op Assessment: Report given to RN and Post -op Vital signs reviewed and stable  Post vital signs: Reviewed and stable  Last Vitals:  Filed Vitals:   04/14/15 1505  BP: 116/69  Pulse: 89  Temp: 36.8 C  Resp: 18    Complications: No apparent anesthesia complications

## 2015-04-14 NOTE — H&P (Signed)
Monique [name] is an 40 y.o. female.   Chief Complaint: dehiscence BKA HPI: patient has had progressive dehiscence of BKA despite wound care  Past Medical History  Diagnosis Date  . Bipolar disorder (HCC)   . 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 GERD (gastroesophageal reflux disease)   . Nerve damage of right foot   . Arthritis   . MRSA infection   . Migraine     "weekly" (12/17/2014)  . Chronic back pain     "middle and lower" (12/17/2014)  . Anxiety   . Depression   . Infection right ankle  . Asthma     PHM  . H/O blood clots   . Anemia     Past Surgical History  Procedure Laterality Date  . Tubal ligation    . Hardware removal Right 09/02/2014    Procedure: Removal Right Lateral Ankle Hardware, Place Antibiotic Bead ;  Surgeon: [name], MD;  Location: [facility] ORS;  Service: Orthopedics;  Laterality: Right;  . Ankle fracture surgery Right 82016  . Fracture surgery    . Dilation and curettage of uterus  X 2  . Incision and drainage abscess Right 12/19/2014    Procedure: INCISION AND DRAINAGE OF ABSCESS ON RIGHT SIDE OF CHIN;  Surgeon: [name], MD;  Location: [facility] OR;  Service: ENT;  Laterality: Right;  . Ankle fusion Right 01/19/2015    Procedure: Right Tibiotalar Fusion, Place Antibiotic Beads;  Surgeon: [name], MD;  Location: [facility] OR;  Service: Orthopedics;  Laterality: Right;  . External fixation leg Right 01/19/2015    Procedure: EXTERNAL FIXATION Right Ankle;  Surgeon: [name], MD;  Location: [facility] OR;  Service: Orthopedics;  Laterality: Right;  . Amputation Right 03/04/2015    Procedure: Right Below Knee Amputation;  Surgeon: [name], MD;  Location[facility];  Service: Orthopedics;  Laterality: Right;  . External fixation removal Right 03/04/2015    Procedure: REMOVAL EXTERNAL FIXATION Right Lower Leg;  Surgeon: [name], MD;  Location: [facility] OR;  Service: Orthopedics;  Laterality: Right;    Family History  Problem Relation Age of Onset  .  Lung cancer Mother   . Cancer Maternal Uncle   . Cancer Maternal Grandfather   . Cancer Paternal Grandfather    Social History:  reports that she has been smoking Cigarettes.  She has a 25 pack-year smoking history. She has never used smokeless tobacco. She reports that she drinks alcohol. She reports that she uses illicit drugs (Cocaine, Marijuana, and Benzodiazepines).  Allergies: No Known Allergies  Medications Prior to Admission  Medication Sig Dispense Refill  . ALPRAZolam (XANAX) 0.5 MG tablet Take 1 tablet (0.5 mg total) by mouth 2 (two) times daily. Take 0.5mg  by mouth in the afternoon and take 0.5mg  by mouth at bedtime 30 tablet 0  . buPROPion (WELLBUTRIN SR) 100 MG 12 hr tablet Take 1 tablet (100 mg total) by mouth 2 (two) times daily. 90 tablet 0  . ferrous sulfate 325 (65 FE) MG tablet Take 1 tablet (325 mg total) by mouth daily with breakfast. 90 tablet 1  . gabapentin (NEURONTIN) 300 MG capsule Take 1 capsule (300 mg total) by mouth 3 (three) times daily. 90 capsule 1  . methocarbamol (ROBAXIN) 500 MG tablet Take 1 tablet (500 mg total) by mouth every 6 (six) hours as needed for muscle spasms. 30 tablet 0  . oxyCODONE (OXYCONTIN) 20 mg 12 hr tablet Take 1  tablet (20 mg total) by mouth every 12 (twelve) hours. 30 tablet 0  . Oxycodone HCl 10 MG TABS Take 1 tablet (10 mg total) by mouth every 6 (six) hours as needed. 40 tablet 0  . senna-docusate (SENOKOT-S) 8.6-50 MG tablet Take 1 tablet by mouth at bedtime as needed for mild constipation. 60 tablet 0    Results for orders placed or performed during the hospital encounter of 04/14/15 (from the past 48 hour(s))  CBC     Status: Abnormal   Collection Time: 04/14/15  3:12 PM  Result Value Ref Range   WBC 6.8 4.0 - 10.5 K/uL   RBC 4.62 3.87 - 5.11 MIL/uL   Hemoglobin 9.8 (L) 12.0 - 15.0 g/dL   HCT 16.1 (L) 09.6 - 04.5 %   MCV 74.5 (L) 78.0 - 100.0 fL   MCH 21.2 (L) 26.0 - 34.0 pg   MCHC 28.5 (L) 30.0 - 36.0 g/dL   RDW 40.9  (H) 81.1 - 15.5 %   Platelets 442 (H) 150 - 400 K/uL  hCG, serum, qualitative     Status: None   Collection Time: 04/14/15  3:12 PM  Result Value Ref Range   Preg, Serum NEGATIVE NEGATIVE    Comment:        THE SENSITIVITY OF THIS METHODOLOGY IS >10 mIU/mL.    No results found.  Review of Systems  All other systems reviewed and are negative.   Blood pressure 116/69, pulse 89, temperature 98.2 F (36.8 C), temperature source Oral, resp. rate 18, height  (1.6 m), weight 83.462 kg (184 lb), last menstrual period 03/23/2015, SpO2 97 %. Physical Exam  dehiscence right BKA Assessment/Plan Dehiscence right BKA Plan for revision right BKA  DUDA,MARCUS V, MD 04/14/2015, 5:05 PM

## 2015-04-14 NOTE — Anesthesia Postprocedure Evaluation (Signed)
Anesthesia Post Note  Patient: Monique [name]  Procedure(s) Performed: Procedure(s) (LRB): RIGHT BELOW KNEE AMPUTATION REVISION (Right)  Patient location during evaluation: PACU Anesthesia Type: General Level of consciousness: awake and alert Pain management: pain level controlled Vital Signs Assessment: post-procedure vital signs reviewed and stable Respiratory status: spontaneous breathing, nonlabored ventilation, respiratory function stable and patient connected to nasal cannula oxygen Cardiovascular status: blood pressure returned to baseline and stable Postop Assessment: no signs of nausea or vomiting Anesthetic complications: no    Last Vitals:  Filed Vitals:   04/14/15 2030 04/14/15 2120  BP: 128/68 124/71  Pulse: 96   Temp: 36.7 C 37 C  Resp:  16    Last Pain:  Filed Vitals:   04/14/15 2121  PainSc: 7                  Senaya Dicenso DAVID

## 2015-04-14 NOTE — Anesthesia Preprocedure Evaluation (Signed)
Anesthesia Evaluation  Patient identified by MRN, date of birth, ID band Patient awake    Reviewed: Allergy & Precautions, NPO status , Patient's Chart, lab work & pertinent test results  History of Anesthesia Complications Negative for: history of anesthetic complications  Airway Mallampati: II  TM Distance: >3 FB Neck ROM: Full    Dental  (+) Poor Dentition, Chipped, Missing, Dental Advisory Given   Pulmonary COPD, Current Smoker,    breath sounds clear to auscultation       Cardiovascular negative cardio ROS   Rhythm:Regular Rate:Normal     Neuro/Psych Anxiety Depression Bipolar Disorder negative neurological ROS     GI/Hepatic GERD  Controlled,(+)     substance abuse  cocaine use and marijuana use,   Endo/Other  Morbid obesity  Renal/GU negative Renal ROS     Musculoskeletal   Abdominal (+) + obese,   Peds  Hematology  (+) Blood dyscrasia (Hb 8.8), ,   Anesthesia Other Findings   Reproductive/Obstetrics BTL                             Anesthesia Physical  Anesthesia Plan  ASA: III  Anesthesia Plan: General   Post-op Pain Management:    Induction: Intravenous  Airway Management Planned: LMA  Additional Equipment:   Intra-op Plan:   Post-operative Plan:   Informed Consent: I have reviewed the patients History and Physical, chart, labs and discussed the procedure including the risks, benefits and alternatives for the proposed anesthesia with the patient or authorized representative who has indicated his/her understanding and acceptance.   Dental advisory given  Plan Discussed with: CRNA and Surgeon  Anesthesia Plan Comments: (Plan routine monitors, GA- LMA OK)        Anesthesia Quick Evaluation

## 2015-04-14 NOTE — Op Note (Signed)
04/14/2015  7:02 PM  PATIENT:  Monique [name]    PRE-OPERATIVE DIAGNOSIS:  Dehiscence right below knee amputation  POST-OPERATIVE DIAGNOSIS:  Same  PROCEDURE:  RIGHT BELOW KNEE AMPUTATION REVISION Application of Prevena wound VAC  SURGEON:  Veronique Warga V, MD  PHYSICIAN ASSISTANT:None ANESTHESIA:   General  PREOPERATIVE INDICATIONS:  Monique [name] is a  40 y.o. female with a diagnosis of Dehiscence right below knee amputation who failed conservative measures and elected for surgical management.    The risks benefits and alternatives were discussed with the patient preoperatively including but not limited to the risks of infection, bleeding, nerve injury, cardiopulmonary complications, the need for revision surgery, among others, and the patient was willing to proceed.  OPERATIVE IMPLANTS: Prevena wound VAC set at 100 mm of suction level intensity  OPERATIVE FINDINGS: Deep tissue with good granulation tissue severely calcified vessels.  OPERATIVE PROCEDURE: Patient brought to the operating room and underwent a general anesthetic. After adequate levels anesthesia obtained patient's right lower extremity was prepped using DuraPrep draped into a sterile field. A timeout was called. An elliptical incision was made around the necrotic soft tissue. This incision was carried down to bone the distal 2 cm of the tibia and fibula were resected with the necrotic tissue. All tissue at the wound bed was healthy and viable. Electrocautery was used for hemostasis. The wound was irrigated with normal saline. The deep and superficial fascial layers and skin was closed using 2-0 nylon. A Prevena wound VAC was applied this had a good suction fit patient was extubated taken to the PACU in stable condition.

## 2015-04-14 NOTE — Anesthesia Procedure Notes (Signed)
Procedure Name: LMA Insertion Date/Time: 04/14/2015 6:29 PM Performed by: Gavin Pound, Nicie Milan J Pre-anesthesia Checklist: Patient identified, Timeout performed, Emergency Drugs available, Patient being monitored and Suction available Patient Re-evaluated:Patient Re-evaluated prior to inductionOxygen Delivery Method: Circle system utilized Preoxygenation: Pre-oxygenation with 100% oxygen Intubation Type: IV induction Ventilation: Mask ventilation without difficulty LMA: LMA inserted Number of attempts: 1 Placement Confirmation: ETT inserted through vocal cords under direct vision and breath sounds checked- equal and bilateral Tube secured with: Tape Dental Injury: Teeth and Oropharynx as per pre-operative assessment

## 2015-04-15 DIAGNOSIS — T8781 Dehiscence of amputation stump: Secondary | ICD-10-CM | POA: Diagnosis not present

## 2015-04-15 LAB — MRSA PCR SCREENING: MRSA BY PCR: NEGATIVE

## 2015-04-15 MED ORDER — ALPRAZOLAM 0.5 MG PO TABS: 0.5000 mg | ORAL_TABLET | Freq: Two times a day (BID) | ORAL | Status: DC | PRN

## 2015-04-15 NOTE — Evaluation (Signed)
Physical Therapy Evaluation Patient Details Name: Monique [name] MRN: [id number] DOB: [birth year] Today's Date: 04/15/2015   History of Present Illness  Pt is a 40 y.o. female now s/p revision of transtibial amputation due to dehiscence. 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   Clinical Impression  Patient seen for initial eval/treatment. Patient able to perform safe bed to chair transfer and back with squat pivot technique. Patient refused to attempt stairs at this time but states that she can bump up then like she normally does or her brother can bump her up in her w/c. At this time the patient states that she is feeling good about her mobility but still having pain. PT to follow to progress mobility acutely.     Follow Up Recommendations No PT follow up;Supervision for mobility/OOB    Equipment Recommendations  None recommended by PT    Recommendations for Other Services       Precautions / Restrictions Precautions Precautions: None Restrictions Weight Bearing Restrictions: Yes RLE Weight Bearing: Non weight bearing      Mobility  Bed Mobility Overal bed mobility: Independent             General bed mobility comments: supine<>sit  Transfers Overall transfer level: Modified independent Equipment used: None             General transfer comment: Patient using squat pivot turn from bed to chair and back. No physical assist needed.   Ambulation/Gait             General Gait Details: Patient refused attempting ambulation, states that she only transfers in/out of her w/c but does not ambulate.   Stairs Stairs:  (Refused attempt, states she feels confident. )          Wheelchair Mobility    Modified Rankin (Stroke Patients Only)       Balance Overall balance assessment: Needs assistance Sitting-balance support: No upper extremity  supported Sitting balance-Leahy Scale: Normal                                       Pertinent Vitals/Pain Pain Assessment: Faces Faces Pain Scale: Hurts little more Pain Location: Rt LE Pain Descriptors / Indicators:  (reports that it just hurts) Pain Intervention(s): Limited activity within patient's tolerance;Monitored during session;Patient requesting pain meds-RN notified    Home Living Family/patient expects to be discharged to:: Private residence Living Arrangements: Non-relatives/Friends Available Help at Discharge: Friend(s);Available 24 hours/day Type of Home: House Home Access: Stairs to enter Entrance Stairs-Rails: Right;Left;Can reach both Entrance Stairs-Number of Steps: 3 Home Layout: One level Home Equipment: Walker - 2 wheels;Wheelchair - manual Additional Comments: patient states she has been bumping up the steps to enter/exit.     Prior Function Level of Independence: Independent with assistive device(s)               Hand Dominance        Extremity/Trunk Assessment   Upper Extremity Assessment: Overall WFL for tasks assessed           Lower Extremity Assessment: LLE deficits/detail   LLE Deficits / Details: WFL, able to use to bear weight with transfers     Communication   Communication: No difficulties  Cognition Arousal/Alertness: Awake/alert Behavior During Therapy: WFL for tasks assessed/performed (patient repeatedly asking for more medication. ) Overall  Cognitive Status: Within Functional Limits for tasks assessed                      General Comments General comments (skin integrity, edema, etc.): Patient requesting more pain medication, nursing entering room and discussing with patient that she has already received her pain meds. Educated pt on need for rt knee extension and elevation.     Exercises        Assessment/Plan    PT Assessment Patient needs continued PT services  PT Diagnosis Difficulty  walking   PT Problem List Decreased strength;Decreased activity tolerance;Decreased mobility;Decreased knowledge of use of DME;Pain  PT Treatment Interventions DME instruction;Gait training;Stair training;Functional mobility training;Therapeutic activities;Therapeutic exercise   PT Goals (Current goals can be found in the Care Plan section) Acute Rehab PT Goals Patient Stated Goal: get home PT Goal Formulation: With patient Time For Goal Achievement: 04/29/15 Potential to Achieve Goals: Good    Frequency Min 3X/week   Barriers to discharge        Co-evaluation               End of Session Equipment Utilized During Treatment:  (refused gait belt) Activity Tolerance: Patient tolerated treatment well Patient left: in bed;with call bell/phone within reach;with bed alarm set;with SCD's reapplied (refused to sit in chair) Nurse Communication: Mobility status;Patient requests pain meds         Time: 0811-0840 PT Time Calculation (min) (ACUTE ONLY): 29 min   Charges:   PT Evaluation $PT Eval Moderate Complexity: 1 Procedure PT Treatments $Therapeutic Activity: 8-22 mins   PT G Codes:        Christiane Ha, PT, CSCS Pager [phone] Office [phone]  04/15/2015, 8:59 AM

## 2015-04-15 NOTE — Care Management Note (Signed)
Case Management Note  Patient Details  Name: Monique [name] MRN: [id number] Date of Birth: 05-30-1975  Subjective/Objective:           S/p right BKA revision         Action/Plan: No home therapy or equipment needs identified by PT. Spoke with patient, she lives with a friend and is working on [employer]. Gave patient discount pharmacy card.  Expected Discharge Date:                  Expected Discharge Plan:  Home w Home Health Services  In-House Referral:  Clinical Social Work, Museum/gallery exhibitions officer  CM Consult  Post Acute Care Choice:  NA Choice offered to:  NA  DME Arranged:  N/A DME Agency:  NA  HH Arranged:  NA HH Agency:  NA  Status of Service:  Completed, signed off  Medicare Important Message Given:    Date Medicare IM Given:    Medicare IM give by:    Date Additional Medicare IM Given:    Additional Medicare Important Message give by:     If discussed at Long Length of Stay Meetings, dates discussed:    Additional Comments:  [name], RN 04/15/2015, 10:52 AM

## 2015-04-15 NOTE — Discharge Summary (Signed)
Physician Discharge Summary  Patient ID: MIKAYELA DEATS MRN: [id number] DOB/AGE: 40-Sep-1977 40 y.o.  Admit date: 04/14/2015 Discharge date: 04/15/2015  Admission Diagnoses: Dehiscence transtibial amputation  Discharge Diagnoses:  Active Problems:   Infection of below knee amputation stump [facility])   Discharged Condition: stable  Hospital Course: Patient's hospital course was essentially unremarkable. She underwent revision transtibial amputation was placed on a Prevena wound VAC. The regular canister was changed over to the portable Prevena canister this morning and patient was discharged to home in stable condition.  Consults: None  Significant Diagnostic Studies: labs: Routine labs  Treatments: surgery: See operative note  Discharge Exam: Blood pressure 104/60, pulse 92, temperature 98.7 F (37.1 C), temperature source Oral, resp. rate 16, height  (1.6 m), weight 83.462 kg (184 lb), last menstrual period 03/23/2015, SpO2 100 %. Incision/Wound: wound VAC functioning well  Disposition: 01-Home or Self Care     Medication List    ASK your doctor about these medications        ALPRAZolam 0.5 MG tablet  Commonly known as:  XANAX  Take 1 tablet (0.5 mg total) by mouth 2 (two) times daily. Take 0.5mg  by mouth in the afternoon and take 0.5mg  by mouth at bedtime     buPROPion 100 MG 12 hr tablet  Commonly known as:  WELLBUTRIN SR  Take 1 tablet (100 mg total) by mouth 2 (two) times daily.     ferrous sulfate 325 (65 FE) MG tablet  Take 1 tablet (325 mg total) by mouth daily with breakfast.     gabapentin 300 MG capsule  Commonly known as:  NEURONTIN  Take 1 capsule (300 mg total) by mouth 3 (three) times daily.     methocarbamol 500 MG tablet  Commonly known as:  ROBAXIN  Take 1 tablet (500 mg total) by mouth every 6 (six) hours as needed for muscle spasms.     Oxycodone HCl 10 MG Tabs  Take 1 tablet (10 mg total) by mouth every 6 (six) hours as needed.     oxyCODONE 20 mg 12 hr tablet  Commonly known as:  OXYCONTIN  Take 1 tablet (20 mg total) by mouth every 12 (twelve) hours.     senna-docusate 8.6-50 MG tablet  Commonly known as:  Senokot-S  Take 1 tablet by mouth at bedtime as needed for mild constipation.           Follow-up Information    Follow up with DUDA,MARCUS V, MD In 1 week.   Specialty:  Orthopedic Surgery   Contact information:   [address] [place] [address] [phone]       Signed: [name] 04/15/2015, 6:13 AM

## 2015-04-17 ENCOUNTER — Encounter (HOSPITAL_COMMUNITY): Payer: Self-pay | Admitting: *Deleted

## 2015-04-17 ENCOUNTER — Emergency Department (HOSPITAL_COMMUNITY)
Admission: EM | Admit: 2015-04-17 | Discharge: 2015-04-18 | Disposition: A | Discharge status: Home / Self Care | Payer: Medicaid Other | Attending: Emergency Medicine | Admitting: Emergency Medicine

## 2015-04-17 DIAGNOSIS — M79604 Pain in right leg: Secondary | ICD-10-CM

## 2015-04-17 DIAGNOSIS — D649 Anemia, unspecified: Secondary | ICD-10-CM | POA: Diagnosis not present

## 2015-04-17 DIAGNOSIS — Z86718 Personal history of other venous thrombosis and embolism: Secondary | ICD-10-CM | POA: Insufficient documentation

## 2015-04-17 DIAGNOSIS — F319 Bipolar disorder, unspecified: Secondary | ICD-10-CM | POA: Diagnosis not present

## 2015-04-17 DIAGNOSIS — F17210 Nicotine dependence, cigarettes, uncomplicated: Secondary | ICD-10-CM | POA: Diagnosis not present

## 2015-04-17 DIAGNOSIS — Z8719 Personal history of other diseases of the digestive system: Secondary | ICD-10-CM | POA: Diagnosis not present

## 2015-04-17 DIAGNOSIS — G8929 Other chronic pain: Secondary | ICD-10-CM | POA: Insufficient documentation

## 2015-04-17 DIAGNOSIS — M1990 Unspecified osteoarthritis, unspecified site: Secondary | ICD-10-CM | POA: Insufficient documentation

## 2015-04-17 DIAGNOSIS — F419 Anxiety disorder, unspecified: Secondary | ICD-10-CM | POA: Diagnosis not present

## 2015-04-17 DIAGNOSIS — G43909 Migraine, unspecified, not intractable, without status migrainosus: Secondary | ICD-10-CM | POA: Diagnosis not present

## 2015-04-17 DIAGNOSIS — Z79899 Other long term (current) drug therapy: Secondary | ICD-10-CM | POA: Diagnosis not present

## 2015-04-17 DIAGNOSIS — Z89511 Acquired absence of right leg below knee: Secondary | ICD-10-CM | POA: Insufficient documentation

## 2015-04-17 DIAGNOSIS — J45909 Unspecified asthma, uncomplicated: Secondary | ICD-10-CM | POA: Diagnosis not present

## 2015-04-17 DIAGNOSIS — M79661 Pain in right lower leg: Secondary | ICD-10-CM | POA: Insufficient documentation

## 2015-04-17 DIAGNOSIS — Z8614 Personal history of Methicillin resistant Staphylococcus aureus infection: Secondary | ICD-10-CM | POA: Insufficient documentation

## 2015-04-17 DIAGNOSIS — Z8619 Personal history of other infectious and parasitic diseases: Secondary | ICD-10-CM | POA: Insufficient documentation

## 2015-04-17 MED ORDER — OXYCODONE-ACETAMINOPHEN 5-325 MG PO TABS
1.0000 | ORAL_TABLET | Freq: Once | ORAL | Status: AC
  Administered 2015-04-18: 1 via ORAL
  Filled 2015-04-17: qty 1

## 2015-04-17 NOTE — ED Notes (Signed)
Pt was recently discharged after a rt bka. Pt states that she has pain and her drainage system is not working properly. Pt assisted to step up and vacuum appears to be working.

## 2015-04-17 NOTE — ED Provider Notes (Signed)
CSN: [id number]     Arrival date & time 04/17/15  1753 History  By signing my name below, I, Monique [name], attest that this documentation has been prepared under the direction and in the presence of Geoffery Lyons, MD.   Electronically Signed: Iona [name], ED Scribe. 04/17/2015. 2:21 PM    Chief Complaint  Patient presents with  . Leg Pain     The history is provided by the patient. No language interpreter was used.   HPI Comments: Monique [name] is a 40 y.o. 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 Pt states that she is worried that the leg is infected and that her blood flow is being effected. She also complains of numbness and tingling in the leg, chills, and hot flashes. No worsening or alleviating factors noted. Pt denies nausea, vomiting, or any other pertinent symptoms. Pt is currently on tramadol to treat her pain. Pt tried to follow up with her surgeon but states that he is out until next week.      Past Medical History  Diagnosis Date  . Bipolar disorder (HCC)   . 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 GERD (gastroesophageal reflux disease)   . Nerve damage of right foot   . Arthritis   . MRSA infection   . Migraine     "weekly" (12/17/2014)  . Chronic back pain     "middle and lower" (12/17/2014)  . Anxiety   . Depression   . Infection right ankle  . Asthma     PHM  . H/O blood clots   . Anemia    Past Surgical History  Procedure Laterality Date  . Tubal ligation    . Hardware removal Right 09/02/2014    Procedure: Removal Right Lateral Ankle Hardware, Place Antibiotic Bead ;  Surgeon: [name], MD;  Location: [facility];  Service: Orthopedics;  Laterality: Right;  . Ankle fracture surgery Right 82016  . Fracture surgery    . Dilation and curettage of uterus  X  2  . Incision and drainage abscess Right 12/19/2014    Procedure: INCISION AND DRAINAGE OF ABSCESS ON RIGHT SIDE OF CHIN;  Surgeon: [name], MD;  Location: [facility] OR;  Service: ENT;  Laterality: Right;  . Ankle fusion Right 01/19/2015    Procedure: Right Tibiotalar Fusion, Place Antibiotic Beads;  Surgeon: [name], MD;  Location: [facility] OR;  Service: Orthopedics;  Laterality: Right;  . External fixation leg Right 01/19/2015    Procedure: EXTERNAL FIXATION Right Ankle;  Surgeon: [name], MD;  Location: [facility] OR;  Service: Orthopedics;  Laterality: Right;  . Amputation Right 03/04/2015    Procedure: Right Below Knee Amputation;  Surgeon: [name], MD;  Location: [facility] OR;  Service: Orthopedics;  Laterality: Right;  . External fixation removal Right 03/04/2015    Procedure: REMOVAL EXTERNAL FIXATION Right Lower Leg;  Surgeon: [name], MD;  Location: [facility] OR;  Service: Orthopedics;  Laterality: Right;   Family History  Problem Relation Age of Onset  . Lung cancer Mother   . Cancer Maternal Uncle   . Cancer Maternal Grandfather   . Cancer Paternal Grandfather    Social History  Substance Use Topics  . Smoking status: Current Every Day Smoker -- 1.00 packs/day for 25 years  Types: Cigarettes  . Smokeless tobacco: Never Used  . Alcohol Use: Yes     Comment:  " 7 months ago " 01/18/15   OB History    No data available     Review of Systems  Gastrointestinal: Negative for nausea and vomiting.  Musculoskeletal: Positive for arthralgias.  All other systems reviewed and are negative.     Allergies  Review of patient's allergies indicates no known allergies.  Home Medications   Prior to Admission medications   Medication Sig Start Date End Date Taking? Authorizing Provider  ALPRAZolam Prudy Feeler) 0.5 MG tablet Take 1 tablet (0.5 mg total) by mouth 2 (two) times daily. Take 0.5mg  by mouth in the afternoon and take 0.5mg  by mouth at bedtime 03/24/15   Henrietta Hoover, NP   ALPRAZolam Prudy Feeler) 0.5 MG tablet Take 1 tablet (0.5 mg total) by mouth 2 (two) times daily as needed for anxiety. 04/15/15   [name], MD  buPROPion (WELLBUTRIN SR) 100 MG 12 hr tablet Take 1 tablet (100 mg total) by mouth 2 (two) times daily. 03/24/15   Henrietta Hoover, NP  ferrous sulfate 325 (65 FE) MG tablet Take 1 tablet (325 mg total) by mouth daily with breakfast. 03/28/15   Henrietta Hoover, NP  gabapentin (NEURONTIN) 300 MG capsule Take 1 capsule (300 mg total) by mouth 3 (three) times daily. 03/24/15   Henrietta Hoover, NP  methocarbamol (ROBAXIN) 500 MG tablet Take 1 tablet (500 mg total) by mouth every 6 (six) hours as needed for muscle spasms. 03/24/15   Henrietta Hoover, NP  oxyCODONE (OXYCONTIN) 20 mg 12 hr tablet Take 1 tablet (20 mg total) by mouth every 12 (twelve) hours. 03/15/15   Samantha Tripp Dowless, PA-C  Oxycodone HCl 10 MG TABS Take 1 tablet (10 mg total) by mouth every 6 (six) hours as needed. 03/07/15   Richarda Overlie, MD  senna-docusate (SENOKOT-S) 8.6-50 MG tablet Take 1 tablet by mouth at bedtime as needed for mild constipation. 03/06/15   Richarda Overlie, MD   BP 105/62 mmHg  Pulse 91  Temp(Src) 98 F (36.7 C) (Oral)  Resp 20  SpO2 100%  LMP 03/23/2015 Physical Exam  Constitutional: She is oriented to person, place, and time. She appears well-developed and well-nourished. No distress.  HENT:  Head: Normocephalic and atraumatic.  Eyes: EOM are normal.  Neck: Normal range of motion.  Cardiovascular: Normal rate, regular rhythm and normal heart sounds.   No murmur heard. Pulmonary/Chest: Effort normal and breath sounds normal. No respiratory distress. She has no wheezes. She has no rales.  Abdominal: Soft. She exhibits no distension. There is no tenderness.  Musculoskeletal: Normal range of motion. She exhibits no edema or tenderness.       Right knee: She exhibits no erythema.  Status post a right BKA. Wound dressing with drain in place. No erythema or warmth  surrounding the dressing site.    Neurological: She is alert and oriented to person, place, and time.  Skin: Skin is warm and dry.  Psychiatric: She has a normal mood and affect. Judgment normal.  Nursing note and vitals reviewed.   ED Course  Procedures (including critical care time) DIAGNOSTIC STUDIES: Oxygen Saturation is 100% on RA, normal by my interpretation.    COORDINATION OF CARE: 11:28 PM-Discussed treatment plan which includes CBC with differential, BMP, and percocet/roxicet with pt at bedside and pt agreed to plan.   Labs Review Labs Reviewed - No data to display  Imaging Review No results found. I have personally reviewed and evaluated these lab results as part of my medical decision-making.    MDM   Final diagnoses:  None    Patient with recent below the knee amputation. She presents with pain in this area. To my exam, the leg appears unremarkable. The dressing is clean and dry and the drain is in proper position. Her laboratory studies are essentially unremarkable and I believe she is appropriate for discharge. She does have a hemoglobin of 7.8 which is down somewhat from her baseline. She is denying any melena or bloody stool and I highly doubt any GI blood loss. I suspect this is related to her previous surgery do not feel as though further workup is indicated into this. She will be discharged with instructions to follow-up with Dr. [name] in the office in the next 1-2 days. She has multiple questions regarding her leg which I do not feel as though I have enough experience with her situation to answer and have advised her to present these questions to Dr. [name].   I personally performed the services described in this documentation, which was scribed in my presence. The recorded information has been reviewed and is accurate.       Geoffery Lyons, MD 04/18/15 [phone]

## 2015-04-18 ENCOUNTER — Encounter (HOSPITAL_COMMUNITY): Payer: Self-pay | Admitting: Orthopedic Surgery

## 2015-04-18 LAB — BASIC METABOLIC PANEL
Anion gap: 10 (ref 5–15)
BUN: 12 mg/dL (ref 6–20)
CHLORIDE: 101 mmol/L (ref 101–111)
CO2: 28 mmol/L (ref 22–32)
CREATININE: 0.59 mg/dL (ref 0.44–1.00)
Calcium: 8.9 mg/dL (ref 8.9–10.3)
GFR calc Af Amer: >60 mL/min (ref >60)
GFR calc non Af Amer: >60 mL/min (ref >60)
GLUCOSE: 97 mg/dL (ref 65–99)
POTASSIUM: 3.3 mmol/L — AB (ref 3.5–5.1)
SODIUM: 139 mmol/L (ref 135–145)

## 2015-04-18 LAB — CBC WITH DIFFERENTIAL/PLATELET
BASOS PCT: 0 %
Basophils Absolute: 0 10*3/uL (ref 0.0–0.1)
EOS PCT: 2 %
Eosinophils Absolute: 0.1 10*3/uL (ref 0.0–0.7)
HEMATOCRIT: 27.9 % — AB (ref 36.0–46.0)
HEMOGLOBIN: 7.6 g/dL — AB (ref 12.0–15.0)
LYMPHS ABS: 3.2 10*3/uL (ref 0.7–4.0)
LYMPHS PCT: 56 %
MCH: 20.4 pg — AB (ref 26.0–34.0)
MCHC: 27.2 g/dL — AB (ref 30.0–36.0)
MCV: 74.8 fL — AB (ref 78.0–100.0)
MONOS PCT: 7 %
Monocytes Absolute: 0.4 10*3/uL (ref 0.1–1.0)
NEUTROS ABS: 2 10*3/uL (ref 1.7–7.7)
Neutrophils Relative %: 35 %
Platelets: 508 10*3/uL — ABNORMAL HIGH (ref 150–400)
RBC: 3.73 MIL/uL — ABNORMAL LOW (ref 3.87–5.11)
RDW: 16.7 % — ABNORMAL HIGH (ref 11.5–15.5)
WBC: 5.7 10*3/uL (ref 4.0–10.5)

## 2015-04-18 LAB — CBG MONITORING, ED: GLUCOSE-CAPILLARY: 94 mg/dL (ref 65–99)

## 2015-04-18 NOTE — Discharge Instructions (Signed)
Call Dr. Audrie Lia office tomorrow to arrange a follow-up appointment.  Continue your medications as before.   Musculoskeletal Pain Musculoskeletal pain is muscle and boney aches and pains. These pains can occur in any part of the body. Your caregiver may treat you without knowing the cause of the pain. They may treat you if blood or urine tests, X-rays, and other tests were normal.  CAUSES There is often not a definite cause or reason for these pains. These pains may be caused by a type of germ (virus). The discomfort may also come from overuse. Overuse includes working out too hard when your body is not fit. Boney aches also come from weather changes. Bone is sensitive to atmospheric pressure changes. HOME CARE INSTRUCTIONS   Ask when your test results will be ready. Make sure you get your test results.  Only take over-the-counter or prescription medicines for pain, discomfort, or fever as directed by your caregiver. If you were given medications for your condition, do not drive, operate machinery or power tools, or sign legal documents for 24 hours. Do not drink alcohol. Do not take sleeping pills or other medications that may interfere with treatment.  Continue all activities unless the activities cause more pain. When the pain lessens, slowly resume normal activities. Gradually increase the intensity and duration of the activities or exercise.  During periods of severe pain, bed rest may be helpful. Lay or sit in any position that is comfortable.  Putting ice on the injured area.  Put ice in a bag.  Place a towel between your skin and the bag.  Leave the ice on for 15 to 20 minutes, 3 to 4 times a day.  Follow up with your caregiver for continued problems and no reason can be found for the pain. If the pain becomes worse or does not go away, it may be necessary to repeat tests or do additional testing. Your caregiver may need to look further for a possible cause. SEEK IMMEDIATE MEDICAL  CARE IF:  You have pain that is getting worse and is not relieved by medications.  You develop chest pain that is associated with shortness or breath, sweating, feeling sick to your stomach (nauseous), or throw up (vomit).  Your pain becomes localized to the abdomen.  You develop any new symptoms that seem different or that concern you. MAKE SURE YOU:   Understand these instructions.  Will watch your condition.  Will get help right away if you are not doing well or get worse.   This information is not intended to replace advice given to you by your health care provider. Make sure you discuss any questions you have with your health care provider.   Document Released: 02/12/2005 Document Revised: 05/07/2011 Document Reviewed: 10/17/2012 Elsevier Interactive Patient Education [employer]

## 2015-04-18 NOTE — ED Notes (Signed)
Received call from lab - need another light green top. Reordered so it could be drawn.

## 2015-04-19 NOTE — Progress Notes (Signed)
Late entry for missing G-code   04-18-2015 0903  PT G-Codes **NOT FOR INPATIENT CLASS**  Functional Assessment Tool Used clinical judgment  Functional Limitation Mobility: Walking and moving around  Mobility: Walking and Moving Around Current Status [phone]) CJ  Mobility: Walking and Moving Around Goal Status (U0454) CI  Christiane Ha, PT, CSCS Pager [phone] Office [phone]

## 2015-04-28 ENCOUNTER — Emergency Department (HOSPITAL_BASED_OUTPATIENT_CLINIC_OR_DEPARTMENT_OTHER)
Admission: EM | Admit: 2015-04-28 | Discharge: 2015-04-28 | Disposition: A | Discharge status: Home / Self Care | Payer: Medicaid Other | Attending: Emergency Medicine | Admitting: Emergency Medicine

## 2015-04-28 ENCOUNTER — Encounter (HOSPITAL_BASED_OUTPATIENT_CLINIC_OR_DEPARTMENT_OTHER): Payer: Self-pay | Admitting: *Deleted

## 2015-04-28 DIAGNOSIS — Z8614 Personal history of Methicillin resistant Staphylococcus aureus infection: Secondary | ICD-10-CM | POA: Insufficient documentation

## 2015-04-28 DIAGNOSIS — G8929 Other chronic pain: Secondary | ICD-10-CM | POA: Insufficient documentation

## 2015-04-28 DIAGNOSIS — Z79899 Other long term (current) drug therapy: Secondary | ICD-10-CM | POA: Diagnosis not present

## 2015-04-28 DIAGNOSIS — M79604 Pain in right leg: Secondary | ICD-10-CM | POA: Insufficient documentation

## 2015-04-28 DIAGNOSIS — Z86718 Personal history of other venous thrombosis and embolism: Secondary | ICD-10-CM | POA: Insufficient documentation

## 2015-04-28 DIAGNOSIS — F419 Anxiety disorder, unspecified: Secondary | ICD-10-CM | POA: Insufficient documentation

## 2015-04-28 DIAGNOSIS — J45909 Unspecified asthma, uncomplicated: Secondary | ICD-10-CM | POA: Diagnosis not present

## 2015-04-28 DIAGNOSIS — F17210 Nicotine dependence, cigarettes, uncomplicated: Secondary | ICD-10-CM | POA: Diagnosis not present

## 2015-04-28 DIAGNOSIS — Z89511 Acquired absence of right leg below knee: Secondary | ICD-10-CM | POA: Diagnosis not present

## 2015-04-28 DIAGNOSIS — F319 Bipolar disorder, unspecified: Secondary | ICD-10-CM | POA: Diagnosis not present

## 2015-04-28 DIAGNOSIS — D649 Anemia, unspecified: Secondary | ICD-10-CM | POA: Insufficient documentation

## 2015-04-28 DIAGNOSIS — G43909 Migraine, unspecified, not intractable, without status migrainosus: Secondary | ICD-10-CM | POA: Diagnosis not present

## 2015-04-28 LAB — CBC WITH DIFFERENTIAL/PLATELET
BASOS ABS: 0 10*3/uL (ref 0.0–0.1)
Basophils Relative: 0 %
EOS PCT: 0 %
Eosinophils Absolute: 0 10*3/uL (ref 0.0–0.7)
HEMATOCRIT: 31.2 % — AB (ref 36.0–46.0)
Hemoglobin: 8.7 g/dL — ABNORMAL LOW (ref 12.0–15.0)
LYMPHS ABS: 2.5 10*3/uL (ref 0.7–4.0)
Lymphocytes Relative: 32 %
MCH: 19.9 pg — ABNORMAL LOW (ref 26.0–34.0)
MCHC: 27.9 g/dL — ABNORMAL LOW (ref 30.0–36.0)
MCV: 71.2 fL — AB (ref 78.0–100.0)
MONOS PCT: 6 %
Monocytes Absolute: 0.5 10*3/uL (ref 0.1–1.0)
NEUTROS PCT: 62 %
Neutro Abs: 4.9 10*3/uL (ref 1.7–7.7)
PLATELETS: 469 10*3/uL — AB (ref 150–400)
RBC: 4.38 MIL/uL (ref 3.87–5.11)
RDW: 16.7 % — AB (ref 11.5–15.5)
WBC: 7.9 10*3/uL (ref 4.0–10.5)

## 2015-04-28 LAB — BASIC METABOLIC PANEL
Anion gap: 8 (ref 5–15)
BUN: 12 mg/dL (ref 6–20)
CALCIUM: 9.3 mg/dL (ref 8.9–10.3)
CHLORIDE: 106 mmol/L (ref 101–111)
CO2: 24 mmol/L (ref 22–32)
CREATININE: 0.54 mg/dL (ref 0.44–1.00)
Glucose, Bld: 111 mg/dL — ABNORMAL HIGH (ref 65–99)
Potassium: 3.9 mmol/L (ref 3.5–5.1)
SODIUM: 138 mmol/L (ref 135–145)

## 2015-04-28 MED ORDER — OXYCODONE-ACETAMINOPHEN 10-325 MG PO TABS: 1.0000 | ORAL_TABLET | ORAL | Status: DC | PRN

## 2015-04-28 MED ORDER — SILVER SULFADIAZINE 1 % EX CREA
TOPICAL_CREAM | Freq: Once | CUTANEOUS | Status: AC
  Administered 2015-04-28: 15:00:00 via TOPICAL
  Filled 2015-04-28: qty 85

## 2015-04-28 MED ORDER — OXYCODONE-ACETAMINOPHEN 5-325 MG PO TABS
1.0000 | ORAL_TABLET | Freq: Once | ORAL | Status: AC
  Administered 2015-04-28: 1 via ORAL
  Filled 2015-04-28: qty 1

## 2015-04-28 MED ORDER — KETOROLAC TROMETHAMINE 60 MG/2ML IM SOLN
60.0000 mg | Freq: Once | INTRAMUSCULAR | Status: AC
  Administered 2015-04-28: 60 mg via INTRAMUSCULAR
  Filled 2015-04-28: qty 2

## 2015-04-28 MED ORDER — DOXYCYCLINE HYCLATE 100 MG PO CAPS: 100.0000 mg | ORAL_CAPSULE | Freq: Two times a day (BID) | ORAL | Status: DC

## 2015-04-28 NOTE — ED Provider Notes (Signed)
CSN: [id number]     Arrival date & time 04/28/15  1153 History   First MD Initiated Contact with Patient 04/28/15 1253     Chief Complaint  Patient presents with  . Leg Pain     (Consider location/radiation/quality/duration/timing/severity/associated sxs/prior Treatment) HPI   Monique [name] is a 40 y.o. female with PMH significant for right BKA (03/04/15 and revision 04/14/15 by Dr. [name]) who presents with 3 day history of right leg pain, pressure, burning, and drainage (green).  Patient reports she ran out of Percocet and has been taking OTC pain medications without relief.  She has a follow up appointment with Dr. [name] in 1-2 weeks.  Denies fever, chills, N/V, or abdominal pain.   Past Medical History  Diagnosis Date  . Bipolar disorder (HCC)   . 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 GERD (gastroesophageal reflux disease)   . Nerve damage of right foot   . Arthritis   . MRSA infection   . Migraine     "weekly" (12/17/2014)  . Chronic back pain     "middle and lower" (12/17/2014)  . Anxiety   . Depression   . Infection right ankle  . Asthma     PHM  . H/O blood clots   . Anemia    Past Surgical History  Procedure Laterality Date  . Tubal ligation    . Hardware removal Right 09/02/2014    Procedure: Removal Right Lateral Ankle Hardware, Place Antibiotic Bead ;  Surgeon: [name], MD;  Location: [facility];  Service: Orthopedics;  Laterality: Right;  . Ankle fracture surgery Right 82016  . Fracture surgery    . Dilation and curettage of uterus  X 2  . Incision and drainage abscess Right 12/19/2014    Procedure: INCISION AND DRAINAGE OF ABSCESS ON RIGHT SIDE OF CHIN;  Surgeon: [name], MD;  Location: [facility] OR;  Service: ENT;  Laterality: Right;  . Ankle fusion Right 01/19/2015    Procedure: Right Tibiotalar Fusion, Place Antibiotic Beads;  Surgeon: [name], MD;  Location: [facility] OR;  Service: Orthopedics;  Laterality: Right;  . External fixation leg Right  01/19/2015    Procedure: EXTERNAL FIXATION Right Ankle;  Surgeon: [name], MD;  Location: [facility] OR;  Service: Orthopedics;  Laterality: Right;  . Amputation Right 03/04/2015    Procedure: Right Below Knee Amputation;  Surgeon: [name], MD;  Location[facility] OR;  Service: Orthopedics;  Laterality: Right;  . External fixation removal Right 03/04/2015    Procedure: REMOVAL EXTERNAL FIXATION Right Lower Leg;  Surgeon: [name], MD;  Location: [facility] OR;  Service: Orthopedics;  Laterality: Right;  . Stump revision Right 04/14/2015    Procedure: RIGHT BELOW KNEE AMPUTATION REVISION;  Surgeon: [name], MD;  Location: [facility] OR;  Service: Orthopedics;  Laterality: Right;   Family History  Problem Relation Age of Onset  . Lung cancer Mother   . Cancer Maternal Uncle   . Cancer Maternal Grandfather   . Cancer Paternal Grandfather    Social History  Substance Use Topics  . Smoking status: Current Every Day Smoker -- 1.00 packs/day for 25 years    Types: Cigarettes  . Smokeless tobacco: Never Used  . Alcohol Use: Yes     Comment:  " 7 months ago " 01/18/15   OB History    No data available     Review of Systems All other systems negative unless otherwise stated  in HPI      Allergies  Review of patient's allergies indicates no known allergies.  Home Medications   Prior to Admission medications   Medication Sig Start Date End Date Taking? Authorizing Provider  ALPRAZolam Prudy Feeler) 0.5 MG tablet Take 1 tablet (0.5 mg total) by mouth 2 (two) times daily as needed for anxiety. 04/15/15  Yes [name], MD  buPROPion (WELLBUTRIN SR) 100 MG 12 hr tablet Take 1 tablet (100 mg total) by mouth 2 (two) times daily. 03/24/15  Yes Henrietta Hoover, NP  cyclobenzaprine (FLEXERIL) 10 MG tablet Take 10 mg by mouth 3 (three) times daily as needed for muscle spasms.   Yes Historical Provider, MD  ferrous sulfate 325 (65 FE) MG tablet Take 1 tablet (325 mg total) by mouth daily with breakfast.  03/28/15  Yes Henrietta Hoover, NP  gabapentin (NEURONTIN) 300 MG capsule Take 1 capsule (300 mg total) by mouth 3 (three) times daily. 03/24/15  Yes Henrietta Hoover, NP  senna-docusate (SENOKOT-S) 8.6-50 MG tablet Take 1 tablet by mouth at bedtime as needed for mild constipation. 03/06/15  Yes Richarda Overlie, MD  ALPRAZolam Prudy Feeler) 0.5 MG tablet Take 1 tablet (0.5 mg total) by mouth 2 (two) times daily. Take 0.5mg  by mouth in the afternoon and take 0.5mg  by mouth at bedtime 03/24/15   Henrietta Hoover, NP  methocarbamol (ROBAXIN) 500 MG tablet Take 1 tablet (500 mg total) by mouth every 6 (six) hours as needed for muscle spasms. 03/24/15   Henrietta Hoover, NP  oxyCODONE (OXYCONTIN) 20 mg 12 hr tablet Take 1 tablet (20 mg total) by mouth every 12 (twelve) hours. 03/15/15   Samantha Tripp Dowless, PA-C  Oxycodone HCl 10 MG TABS Take 1 tablet (10 mg total) by mouth every 6 (six) hours as needed. 03/07/15   Richarda Overlie, MD   BP 128/74 mmHg  Pulse 78  Temp(Src) 98.1 F (36.7 C) (Oral)  Resp 18  Ht  (1.6 m)  Wt 81.647 kg  BMI 31.89 kg/m2  SpO2 100%  LMP 03/23/2015 Physical Exam  Constitutional: She is oriented to person, place, and time. She appears well-developed and well-nourished.  Non-toxic appearance. She does not have a sickly appearance. She does not appear ill.  HENT:  Head: Normocephalic and atraumatic.  Mouth/Throat: Oropharynx is clear and moist.  Eyes: Conjunctivae are normal. Pupils are equal, round, and reactive to light.  Neck: Normal range of motion. Neck supple.  Cardiovascular: Normal rate, regular rhythm and normal heart sounds.   No murmur heard. Pulmonary/Chest: Effort normal and breath sounds normal. No accessory muscle usage or stridor. No respiratory distress. She has no wheezes. She has no rhonchi. She has no rales.  Abdominal: Soft. Bowel sounds are normal. She exhibits no distension. There is no tenderness.  Musculoskeletal: Normal range of motion.    Lymphadenopathy:    She has no cervical adenopathy.  Neurological: She is alert and oriented to person, place, and time.  Speech clear without dysarthria.  Skin: Skin is warm and dry.  Status post right BKA.  Mild surrounding erythema. No warmth, fluctuance, or induration. Mild drainage. Sutures in place. See photos below.  Psychiatric: She has a normal mood and affect. Her behavior is normal.          ED Course  Procedures (including critical care time) Labs Review Labs Reviewed  CBC WITH DIFFERENTIAL/PLATELET - Abnormal; Notable for the following:    Hemoglobin 8.7 (*)    HCT 31.2 (*)  MCV 71.2 (*)    MCH 19.9 (*)    MCHC 27.9 (*)    RDW 16.7 (*)    Platelets 469 (*)    All other components within normal limits  BASIC METABOLIC PANEL - Abnormal; Notable for the following:    Glucose, Bld 111 (*)    All other components within normal limits    Imaging Review No results found. I have personally reviewed and evaluated these images and lab results as part of my medical decision-making.   EKG Interpretation None      MDM   Final diagnoses:  Right leg pain  Status post below knee amputation of right lower extremity (HCC)   Patient s/p right BKA presents with pain and drainage from this area.  Plan to obtain labs and consult orthopedics.  Patient given Percocet and Toradol in ED. Spoke with Dr. Roda Shutters, recommend Doxycycline, Silvadene, Percocet, and close follow up with Dr. [name].  Labs without acute abnormality, hgb improved since previous visit and likely related to recent revision procedure.  Plan to discharge home with doxycycline, silvadene, and percocet.  Follow up with Dr. [name].  Discussed return precautions.  Patient agrees and acknowledges the above plan for discharge. Case has been discussed with Dr. Verdie Mosher who agrees with the above plan for discharge.      Cheri Fowler, PA-C 04/28/15 1508  Lavera Guise, MD 04/28/15 [phone]

## 2015-04-28 NOTE — ED Notes (Signed)
Pt had right BKA in January. Reports recent revision 2 weeks ago. C/O increasing pain and pressure x 3 days

## 2015-04-28 NOTE — ED Notes (Signed)
Silver sulfadene cream applied as ordered, wound covered with sterile 4x4's, wrapped with kerlix. Pt tolerated well, states 'It feels fine".

## 2015-04-28 NOTE — Discharge Instructions (Signed)
Stump and Prosthesis Care When an arm or leg is removed, it is important to care for the artificial body part that replaces it (prosthesis) and for the remaining end of the arm or leg (stump). Caring for the stump and prosthesis will help you to be comfortable, active, and healthy. HOW TO CARE FOR YOUR STUMP Cleaning Your Skin  Wash your stump with a mild antibacterial soap at least once per day.  Wash your stump after getting dirty or sweaty.  After washing your stump, pat it dry and let it air-dry for 5-10 minutes.  Do not soak your stump in a warm or hot bath for longer than 20 minutes at a time.  Avoid shaving hair on the stump. Hair that grows out after being shaved is more easily irritated by the prosthesis. Using Skin Care Products  Apply ointment to your surgical scar if your health care provider instructed you to do so. This can keep the scar soft and help it heal.  Do not put creams and lotions on your stump unless your health care provider says it is okay. If your health care provider says it is okay to put creams and lotions on your stump, do not use lotions that contain petroleum jelly.  Do not use skin care products with an alcohol base. These products can be harmful to your skin. They can also damage the lining of the prosthesis.  Consider using an antiperspirant spray on the skin of the stump if you are prone to sweating. Other Instructions  Every day, look closely at the skin on your stump. Use a mirror with a long handle to check areas you cannot see, or ask a friend or family member to check those areas. Look for areas that appear reddish, swollen, or irritated. Pay extra attention to places where the stump and prosthesis rub together. HOW TO CARE FOR YOUR PROSTHESIS Cleaning Your Prosthesis  Use hot water and antibacterial soap to wash your prosthesis. Attaching Your Prosthesis  Make sure your prosthesis is clean before you attach it to your stump. All the parts  that touch your skin should be clean and dry.  Be sure you understand how to attach the prosthesis. A prosthetic specialist (prosthetist) can show you how to do this. It is a good idea to practice several times while he or she watches.  If you were given wraps or socks to wear under the prosthesis, make sure to wear them. Other Instructions  Exercise and move your prosthesis as recommended by your physical therapist.  Follow your health care provider's instructions about the length of time you should wear your prosthesis. You will likely need to limit the amount of time you wear your prosthesis at first. You may be instructed to increase the time you wear your prosthesis a little bit each day. SEEK MEDICAL CARE IF:  The prosthesis does not seem to fit correctly.  You have an itchy rash or a sore on your stump.  Sweating between the stump and the prosthesis is heavy and efforts to control the sweating do not work. SEEK IMMEDIATE MEDICAL CARE IF:  Your stump is red, swollen, painful to the touch, or hot.  A bad smell develops around the stump.  There is a sore on your stump that is not healing.  Your stump is colder than the upper part of the limb.  Skin on your stump turns gray or black.  There is any drainage coming from your stump.   This information   is not intended to replace advice given to you by your health care provider. Make sure you discuss any questions you have with your health care provider.   Document Released: 05/09/2009 Document Revised: 06/29/2014 Document Reviewed: 02/08/2014 Elsevier Interactive Patient Education 2016 [employer]  

## 2015-06-23 ENCOUNTER — Ambulatory Visit: Payer: Self-pay | Admitting: Family Medicine

## 2015-06-24 ENCOUNTER — Ambulatory Visit: Payer: Self-pay | Admitting: Family Medicine

## 2015-11-08 ENCOUNTER — Encounter (HOSPITAL_BASED_OUTPATIENT_CLINIC_OR_DEPARTMENT_OTHER): Payer: Self-pay | Admitting: Emergency Medicine

## 2015-11-08 ENCOUNTER — Emergency Department (HOSPITAL_BASED_OUTPATIENT_CLINIC_OR_DEPARTMENT_OTHER)
Admission: EM | Admit: 2015-11-08 | Discharge: 2015-11-09 | Disposition: A | Discharge status: Home / Self Care | Payer: Medicaid Other | Attending: Emergency Medicine | Admitting: Emergency Medicine

## 2015-11-08 DIAGNOSIS — R5383 Other fatigue: Secondary | ICD-10-CM | POA: Diagnosis not present

## 2015-11-08 DIAGNOSIS — J45909 Unspecified asthma, uncomplicated: Secondary | ICD-10-CM | POA: Diagnosis not present

## 2015-11-08 DIAGNOSIS — R531 Weakness: Secondary | ICD-10-CM | POA: Diagnosis not present

## 2015-11-08 DIAGNOSIS — N739 Female pelvic inflammatory disease, unspecified: Secondary | ICD-10-CM | POA: Insufficient documentation

## 2015-11-08 DIAGNOSIS — N898 Other specified noninflammatory disorders of vagina: Secondary | ICD-10-CM | POA: Diagnosis present

## 2015-11-08 DIAGNOSIS — F17210 Nicotine dependence, cigarettes, uncomplicated: Secondary | ICD-10-CM | POA: Insufficient documentation

## 2015-11-08 DIAGNOSIS — N730 Acute parametritis and pelvic cellulitis: Secondary | ICD-10-CM
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
## 2015-11-09 LAB — URINE MICROSCOPIC-ADD ON

## 2015-11-09 LAB — URINALYSIS, ROUTINE W REFLEX MICROSCOPIC
GLUCOSE, UA: NEGATIVE mg/dL
KETONES UR: NEGATIVE mg/dL
Nitrite: NEGATIVE
PH: 5.5 (ref 5.0–8.0)
PROTEIN: 100 mg/dL — AB
Specific Gravity, Urine: 1.023 (ref 1.005–1.030)

## 2015-11-09 LAB — WET PREP, GENITAL
SPERM: NONE SEEN
Yeast Wet Prep HPF POC: NONE SEEN

## 2015-11-09 LAB — GC/CHLAMYDIA PROBE AMP (~~LOC~~) NOT AT ARMC
Chlamydia: NEGATIVE
Neisseria Gonorrhea: NEGATIVE

## 2015-11-09 LAB — PREGNANCY, URINE: PREG TEST UR: NEGATIVE

## 2015-11-09 MED ORDER — CEFTRIAXONE SODIUM 250 MG IJ SOLR
INTRAMUSCULAR | Status: AC
  Filled 2015-11-09: qty 250

## 2015-11-09 MED ORDER — METRONIDAZOLE 500 MG PO TABS
ORAL_TABLET | ORAL | Status: AC
  Filled 2015-11-09: qty 4

## 2015-11-09 MED ORDER — ACETAMINOPHEN 325 MG PO TABS
ORAL_TABLET | ORAL | Status: AC
  Filled 2015-11-09: qty 2

## 2015-11-09 MED ORDER — DOXYCYCLINE HYCLATE 100 MG PO CAPS: 100.0000 mg | ORAL_CAPSULE | Freq: Two times a day (BID) | ORAL | 0 refills | Status: DC

## 2015-11-09 MED ORDER — DOXYCYCLINE HYCLATE 100 MG PO TABS
ORAL_TABLET | ORAL | Status: AC
  Filled 2015-11-09: qty 1

## 2015-11-09 NOTE — ED Notes (Signed)
Pt verbalizes understanding of d/c instructions and denies any further needs at this time. 

## 2015-11-09 NOTE — Discharge Instructions (Signed)
Ibuprofen and tylenol for pain. Take doxycyline as prescribed until all gone. Follow up with your doctor, return if worsening.

## 2015-11-10 LAB — RPR: RPR: NONREACTIVE

## 2015-11-10 LAB — HIV ANTIBODY (ROUTINE TESTING W REFLEX): HIV Screen 4th Generation wRfx: NONREACTIVE

## 2015-11-18 ENCOUNTER — Encounter (HOSPITAL_COMMUNITY): Payer: Self-pay | Admitting: *Deleted

## 2015-11-18 ENCOUNTER — Emergency Department (HOSPITAL_COMMUNITY)
Admission: EM | Admit: 2015-11-18 | Discharge: 2015-11-18 | Disposition: A | Discharge status: Home / Self Care | Payer: Medicaid Other | Attending: Emergency Medicine | Admitting: Emergency Medicine

## 2015-11-18 DIAGNOSIS — F17210 Nicotine dependence, cigarettes, uncomplicated: Secondary | ICD-10-CM | POA: Insufficient documentation

## 2015-11-18 DIAGNOSIS — J45909 Unspecified asthma, uncomplicated: Secondary | ICD-10-CM | POA: Insufficient documentation

## 2015-11-18 DIAGNOSIS — F329 Major depressive disorder, single episode, unspecified: Secondary | ICD-10-CM | POA: Diagnosis present

## 2015-11-18 DIAGNOSIS — F10920 Alcohol use, unspecified with intoxication, uncomplicated: Secondary | ICD-10-CM

## 2015-11-18 DIAGNOSIS — F10120 Alcohol abuse with intoxication, uncomplicated: Secondary | ICD-10-CM | POA: Insufficient documentation

## 2015-11-18 LAB — CBC
HCT: 40.6 % (ref 36.0–46.0)
Hemoglobin: 11.9 g/dL — ABNORMAL LOW (ref 12.0–15.0)
MCH: 21.8 pg — AB (ref 26.0–34.0)
MCHC: 29.3 g/dL — AB (ref 30.0–36.0)
MCV: 74.2 fL — ABNORMAL LOW (ref 78.0–100.0)
PLATELETS: 583 10*3/uL — AB (ref 150–400)
RBC: 5.47 MIL/uL — AB (ref 3.87–5.11)
RDW: 16.9 % — ABNORMAL HIGH (ref 11.5–15.5)
WBC: 9 10*3/uL (ref 4.0–10.5)

## 2015-11-18 LAB — COMPREHENSIVE METABOLIC PANEL
ALBUMIN: 3.9 g/dL (ref 3.5–5.0)
ALK PHOS: 61 U/L (ref 38–126)
ALT: 28 U/L (ref 14–54)
ANION GAP: 12 (ref 5–15)
AST: 23 U/L (ref 15–41)
BILIRUBIN TOTAL: 0.2 mg/dL — AB (ref 0.3–1.2)
BUN: 7 mg/dL (ref 6–20)
CALCIUM: 8.7 mg/dL — AB (ref 8.9–10.3)
CO2: 17 mmol/L — ABNORMAL LOW (ref 22–32)
Chloride: 110 mmol/L (ref 101–111)
Creatinine, Ser: 0.72 mg/dL (ref 0.44–1.00)
GLUCOSE: 113 mg/dL — AB (ref 65–99)
Potassium: 3.6 mmol/L (ref 3.5–5.1)
Sodium: 139 mmol/L (ref 135–145)
TOTAL PROTEIN: 8 g/dL (ref 6.5–8.1)

## 2015-11-18 LAB — ETHANOL: ALCOHOL ETHYL (B): 90 mg/dL — AB (ref <5)

## 2015-11-18 NOTE — ED Triage Notes (Addendum)
Pt requesting detox. Reports feeling depression for a while worsening last night, will not disclose information. Pt tearful in triage. Has been drinking large amounts of alcohol since last night. Denies drug use. Denies SI/HI

## 2015-11-18 NOTE — ED Notes (Signed)
Dr. Oni at bedside. 

## 2015-11-18 NOTE — ED Notes (Signed)
Lunch was ordered for patient. 

## 2015-11-18 NOTE — ED Notes (Signed)
Pt placed in paper scrubs.

## 2015-11-18 NOTE — ED Provider Notes (Signed)
[facility] DEPT Provider Note   CSN: [id number] Arrival date & time: 11/18/15  0202     History   Chief Complaint Chief Complaint  Patient presents with  . Depression  . Medical Clearance    HPI Monique [name] is a 40 y.o. female PMH of bipolar, chronic back pain presenting today with alcohol abuse.  Patient states she drink " a lot of liquor" last night and she is here for detox.  She denies drinking alcohol every day.  She has never had withdrawal seizures.  She states her entire body aches.  She tells me that she was brought her by the police, but she does not know where she was. She denies any other illicit substances.  She denies any SI or HI.  There are no further complaints.  10 Systems reviewed and are negative for acute change except as noted in the HPI.   HPI  Past Medical History:  Diagnosis Date  . Anemia   . Anxiety   . Arthritis   . Asthma    PHM  . Bipolar disorder (HCC)   . Chronic back pain    "middle and lower" (12/17/2014)  . Depression   . GERD (gastroesophageal reflux disease)   . H/O blood clots   . Infection right ankle  . Migraine    "weekly" (12/17/2014)  . MRSA infection   . Nerve damage of right foot   . Osteomyelitis of ankle [facility])    right    Patient Active Problem List   Diagnosis Date Noted  . 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 Polysubstance abuse 03/15/2015  . 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 Phantom pain (HCC) 03/15/2015  . Elevated lactic acid level 03/15/2015  . BKA stump complication (HCC)   . Fever   . Acute osteomyelitis of right foot (HCC)   . Osteomyelitis (HCC) 03/02/2015  . Cellulitis 03/02/2015  . 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 Cellulitis of face   . Cellulitis and abscess 12/17/2014  . Nicotine abuse 12/17/2014  . Anemia, chronic disease 12/17/2014  . Septic arthritis of right ankle (HCC) 10/01/2014  . Septic arthritis of left ankle (HCC)  09/30/2014  . Conjunctivitis, left eye   . Wound infection after surgery 09/29/2014  . Irritation of left eye 09/29/2014  . Wound, surgical, infected 09/29/2014  . Osteomyelitis of right ankle (HCC)   . Acute encephalopathy 08/29/2014  . Drug abuse 08/29/2014  . Cocaine abuse 08/29/2014  . Acute respiratory failure with hypoxia (HCC) 08/29/2014    Past Surgical History:  Procedure Laterality Date  . AMPUTATION Right 03/04/2015   Procedure: Right Below Knee Amputation;  Surgeon: [name], MD;  Location: [facility] OR;  Service: Orthopedics;  Laterality: Right;  . ANKLE FRACTURE SURGERY Right 82016  . ANKLE FUSION Right 01/19/2015   Procedure: Right Tibiotalar Fusion, Place Antibiotic Beads;  Surgeon: [name], MD;  Location: [facility] OR;  Service: Orthopedics;  Laterality: Right;  . DILATION AND CURETTAGE OF UTERUS  X 2  . EXTERNAL FIXATION LEG Right 01/19/2015   Procedure: EXTERNAL FIXATION Right Ankle;  Surgeon: [name], MD;  Location: [facility] OR;  Service: Orthopedics;  Laterality: Right;  . EXTERNAL FIXATION REMOVAL Right 03/04/2015   Procedure: REMOVAL EXTERNAL FIXATION Right Lower Leg;  Surgeon: [name], MD;  Location: [facility] OR;  Service: Orthopedics;  Laterality: Right;  . FRACTURE SURGERY    . HARDWARE REMOVAL Right  09/02/2014   Procedure: Removal Right Lateral Ankle Hardware, Place Antibiotic Bead ;  Surgeon: [name], MD;  Location: [facility] ORS;  Service: Orthopedics;  Laterality: Right;  . INCISION AND DRAINAGE ABSCESS Right 12/19/2014   Procedure: INCISION AND DRAINAGE OF ABSCESS ON RIGHT SIDE OF CHIN;  Surgeon: [name], MD;  Location: [facility] OR;  Service: ENT;  Laterality: Right;  . STUMP REVISION Right 04/14/2015   Procedure: RIGHT BELOW KNEE AMPUTATION REVISION;  Surgeon: [name], MD;  Location: [facility] OR;  Service: Orthopedics;  Laterality: Right;  . TUBAL LIGATION      OB History    No data available       Home Medications    Prior to Admission medications     Medication Sig Start Date End Date Taking? Authorizing Provider  ALPRAZolam Prudy Feeler) 0.5 MG tablet Take 1 tablet (0.5 mg total) by mouth 2 (two) times daily. Take 0.5mg  by mouth in the afternoon and take 0.5mg  by mouth at bedtime 03/24/15   Henrietta Hoover, NP  ALPRAZolam Prudy Feeler) 0.5 MG tablet Take 1 tablet (0.5 mg total) by mouth 2 (two) times daily as needed for anxiety. 04/15/15   [name], MD  buPROPion (WELLBUTRIN SR) 100 MG 12 hr tablet Take 1 tablet (100 mg total) by mouth 2 (two) times daily. 03/24/15   Henrietta Hoover, NP  cyclobenzaprine (FLEXERIL) 10 MG tablet Take 10 mg by mouth 3 (three) times daily as needed for muscle spasms.    Historical Provider, MD  doxycycline (VIBRAMYCIN) 100 MG capsule Take 1 capsule (100 mg total) by mouth 2 (two) times daily. 11/09/15   Tatyana Kirichenko, PA-C  ferrous sulfate 325 (65 FE) MG tablet Take 1 tablet (325 mg total) by mouth daily with breakfast. 03/28/15   Henrietta Hoover, NP  gabapentin (NEURONTIN) 300 MG capsule Take 1 capsule (300 mg total) by mouth 3 (three) times daily. 03/24/15   Henrietta Hoover, NP  methocarbamol (ROBAXIN) 500 MG tablet Take 1 tablet (500 mg total) by mouth every 6 (six) hours as needed for muscle spasms. 03/24/15   Henrietta Hoover, NP  oxyCODONE (OXYCONTIN) 20 mg 12 hr tablet Take 1 tablet (20 mg total) by mouth every 12 (twelve) hours. 03/15/15   Samantha Tripp Dowless, PA-C  Oxycodone HCl 10 MG TABS Take 1 tablet (10 mg total) by mouth every 6 (six) hours as needed. 03/07/15   Richarda Overlie, MD  oxyCODONE-acetaminophen (PERCOCET) 10-325 MG tablet Take 1 tablet by mouth every 4 (four) hours as needed for pain. 04/28/15   Kayla Rose, PA-C  senna-docusate (SENOKOT-S) 8.6-50 MG tablet Take 1 tablet by mouth at bedtime as needed for mild constipation. 03/06/15   Richarda Overlie, MD    Family History Family History  Problem Relation Age of Onset  . Lung cancer Mother   . Cancer Maternal Uncle   . Cancer Maternal Grandfather    . Cancer Paternal Grandfather     Social History Social History  Substance Use Topics  . Smoking status: Current Every Day Smoker    Packs/day: 1.00    Years: 25.00    Types: Cigarettes  . Smokeless tobacco: Never Used  . Alcohol use Yes     Comment:  " 7 months ago " 01/18/15     Allergies   Review of patient's allergies indicates no known allergies.   Review of Systems Review of Systems   Physical Exam Updated Vital Signs BP 118/71   Pulse 87  Temp 98.2 F (36.8 C)   Resp 16   LMP 11/14/2015   SpO2 95%   Physical Exam  Constitutional: She is oriented to person, place, and time. She appears well-developed and well-nourished. No distress.  HENT:  Head: Normocephalic and atraumatic.  Nose: Nose normal.  Mouth/Throat: Oropharynx is clear and moist. No oropharyngeal exudate.  No tongue fasculation seen.   Eyes: Conjunctivae and EOM are normal. Pupils are equal, round, and reactive to light. No scleral icterus.  Neck: Normal range of motion. Neck supple. No JVD present. No tracheal deviation present. No thyromegaly present.  Cardiovascular: Normal rate, regular rhythm and normal heart sounds.  Exam reveals no gallop and no friction rub.   No murmur heard. Pulmonary/Chest: Effort normal and breath sounds normal. No respiratory distress. She has no wheezes. She exhibits no tenderness.  Abdominal: Soft. Bowel sounds are normal. She exhibits no distension and no mass. There is no tenderness. There is no rebound and no guarding.  Musculoskeletal: Normal range of motion. She exhibits no edema or tenderness.  S/p RLE BKA  Lymphadenopathy:    She has no cervical adenopathy.  Neurological: She is alert and oriented to person, place, and time. No cranial nerve deficit. She exhibits normal muscle tone.  Normal strength and sensation in all extremities.  Skin: Skin is warm and dry. No rash noted. No erythema. No pallor.  Nursing note and vitals reviewed.    ED Treatments  / Results  Labs (all labs ordered are listed, but only abnormal results are displayed) Labs Reviewed  COMPREHENSIVE METABOLIC PANEL - Abnormal; Notable for the following:       Result Value   CO2 17 (*)    Glucose, Bld 113 (*)    Calcium 8.7 (*)    Total Bilirubin 0.2 (*)    All other components within normal limits  ETHANOL - Abnormal; Notable for the following:    Alcohol, Ethyl (B) 90 (*)    All other components within normal limits  CBC - Abnormal; Notable for the following:    RBC 5.47 (*)    Hemoglobin 11.9 (*)    MCV 74.2 (*)    MCH 21.8 (*)    MCHC 29.3 (*)    RDW 16.9 (*)    Platelets 583 (*)    All other components within normal limits  URINE RAPID DRUG SCREEN, HOSP PERFORMED    EKG  EKG Interpretation None       Radiology No results found.  Procedures Procedures (including critical care time)  Medications Ordered in ED Medications - No data to display   Initial Impression / Assessment and Plan / ED Course  I have reviewed the triage vital signs and the nursing notes.  Pertinent labs & imaging results that were available during my care of the patient were reviewed by me and considered in my medical decision making (see chart for details).  Clinical Course    Patient presents to the ED for alcohol intoxication, she is requesting detox.  There are no signs of withdrawal on exam.  VS are also normal.  Labs drawn several hrs ago reveal etoh level of 90, currently patient is clinically sober.  She was given multiple resources for outpatient help with detox from alcohol.  She advised to seek help first thing this morning.  She continues to deny any SI or HI.  She appears well and in NAD. Vs remain within her normal limits and she is safe for DC.  Final Clinical  Impressions(s) / ED Diagnoses   Final diagnoses:  Alcohol intoxication, uncomplicated (HCC)    New Prescriptions New Prescriptions   No medications on file     [name], MD 11/18/15  [phone]

## 2015-12-15 ENCOUNTER — Ambulatory Visit: Payer: Self-pay | Admitting: Physical Therapy

## 2015-12-19 ENCOUNTER — Ambulatory Visit: Payer: Medicaid Other | Attending: Orthopedic Surgery | Admitting: Physical Therapy

## 2015-12-21 ENCOUNTER — Ambulatory Visit (INDEPENDENT_AMBULATORY_CARE_PROVIDER_SITE_OTHER): Payer: Medicaid Other | Admitting: Family

## 2015-12-21 ENCOUNTER — Telehealth (INDEPENDENT_AMBULATORY_CARE_PROVIDER_SITE_OTHER): Payer: Self-pay | Admitting: *Deleted

## 2015-12-21 VITALS — Ht 64.0 in | Wt 180.0 lb

## 2015-12-21 DIAGNOSIS — Z89511 Acquired absence of right leg below knee: Secondary | ICD-10-CM

## 2015-12-21 DIAGNOSIS — F1910 Other psychoactive substance abuse, uncomplicated: Secondary | ICD-10-CM

## 2015-12-21 MED ORDER — OXYCODONE-ACETAMINOPHEN 10-325 MG PO TABS: 1.0000 | ORAL_TABLET | Freq: Three times a day (TID) | ORAL | 0 refills | Status: DC | PRN

## 2015-12-21 MED ORDER — CYCLOBENZAPRINE HCL 10 MG PO TABS: 10.0000 mg | ORAL_TABLET | Freq: Three times a day (TID) | ORAL | 0 refills | Status: DC | PRN

## 2015-12-21 NOTE — Telephone Encounter (Signed)
Pt[facility] [place] at [address]. Call back number [phone]

## 2015-12-21 NOTE — Progress Notes (Signed)
Office Visit Note   Patient: Monique [name]           Date of Birth: [birth year]           MRN: [id number] Visit Date: 12/21/2015              Requested by: No referring provider defined for this encounter. PCP: [name] Patient   Assessment & Plan: Visit Diagnoses:  1. Hx of BKA, right (HCC)   2. Polysubstance abuse     Plan:  We will continue to work on her pain management referral. In the meantime provide refills for her Percocet as well as Flexeril. She will continue to follow with physical therapy for gait training. Follow-up in office in 4 more weeks.  Follow-Up Instructions: Return in about 4 weeks (around 01/18/2016).   Orders:  No orders of the defined types were placed in this encounter.  Meds ordered this encounter  Medications  . oxyCODONE-acetaminophen (PERCOCET) 10-325 MG tablet    Sig: Take 1 tablet by mouth every 8 (eight) hours as needed for pain.    Dispense:  30 tablet    Refill:  0  . cyclobenzaprine (FLEXERIL) 10 MG tablet    Sig: Take 1 tablet (10 mg total) by mouth 3 (three) times daily as needed for muscle spasms.    Dispense:  30 tablet    Refill:  0      Procedures: No procedures performed   Clinical Data: No additional findings.   Subjective: Chief Complaint  Patient presents with  . Right Leg - Pain    S/p right BKA 04/04/15    Pt follow up for right BKA 04/04/15 she is walking today with crutches in her prosthetic. This was fabricated by [employer] and the pt states that even after many adjustments that it still feels like the load bearing tip of the limp is in pain from improper fit or padding. She states that today is the first day that she has " really been walking with it" due to the needed adjustments. Pt states that she is still waiting for the pain management referral to [facility]. I have told the pt the referral was placed the end of August and we have called and faxd multiple times. Per the pt report she states "  they are having issues with insurance fraud" and that she probably need to look elsewhere for her pain management.  Pt has had a rect rx for the following Percocet 10/325 #30 on 12/01/15, xanax 0.5mg  # 90 on 12/12/15 andultram 50mg  # 90 on 12/01/15    Review of Systems  Constitutional: Negative for chills and fever.     Objective: Vital Signs: Ht 5\' 4"  (1.626 m)   Wt 180 lb (81.6 kg)   BMI 30.90 kg/m   Physical Exam  Ortho Exam  Does have an antalgic gait. Ambulatory with crutches today. The right residual limb is well consolidated and well-healed. No open areas. no ulcers or sign of infection.  Specialty Comments:  No specialty comments available.  Imaging: No results found.   PMFS History: Patient Active Problem List   Diagnosis Date Noted  . 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 Polysubstance abuse 03/15/2015  . 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 Phantom pain 03/15/2015  . Elevated lactic acid level 03/15/2015  . BKA stump complication (HCC)   . Fever   . Acute osteomyelitis of right foot (  HCC)   . Osteomyelitis (HCC) 03/02/2015  . Cellulitis 03/02/2015  . 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 Cellulitis of face   . Cellulitis and abscess 12/17/2014  . Nicotine abuse 12/17/2014  . Anemia, chronic disease 12/17/2014  . Septic arthritis of right ankle (HCC) 10/01/2014  . Septic arthritis of left ankle (HCC) 09/30/2014  . Conjunctivitis, left eye   . Wound infection after surgery 09/29/2014  . Irritation of left eye 09/29/2014  . Wound, surgical, infected 09/29/2014  . Osteomyelitis of right ankle (HCC)   . Acute encephalopathy 08/29/2014  . Drug abuse 08/29/2014  . Cocaine abuse 08/29/2014  . Acute respiratory failure with hypoxia (HCC) 08/29/2014   Past Medical History:  Diagnosis Date  . Anemia   . Anxiety   . Arthritis   . Asthma    PHM  . Bipolar disorder (HCC)   . Chronic back pain    "middle and  lower" (12/17/2014)  . Depression   . GERD (gastroesophageal reflux disease)   . H/O blood clots   . Infection right ankle  . Migraine    "weekly" (12/17/2014)  . MRSA infection   . Nerve damage of right foot   . Osteomyelitis of ankle (HCC)    right    Family History  Problem Relation Age of Onset  . Lung cancer Mother   . Cancer Maternal Uncle   . Cancer Maternal Grandfather   . Cancer Paternal Grandfather     Past Surgical History:  Procedure Laterality Date  . AMPUTATION Right 03/04/2015   Procedure: Right Below Knee Amputation;  Surgeon: Nadara MustardMarcus Duda V, MD;  [facility];  Service: Orthopedics;  Laterality: Right;  . ANKLE FRACTURE SURGERY Right 82016  . ANKLE FUSION Right 01/19/2015   Procedure: Right Tibiotalar Fusion, Place Antibiotic Beads;  Surgeon: Nadara MustardMarcus Duda V, MD;  Location: [facility] OR;  Service: Orthopedics;  Laterality: Right;  . DILATION AND CURETTAGE OF UTERUS  X 2  . EXTERNAL FIXATION LEG Right 01/19/2015   Procedure: EXTERNAL FIXATION Right Ankle;  Surgeon: Nadara MustardMarcus Duda V, MD;  [facility];  Service: Orthopedics;  Laterality: Right;  . EXTERNAL FIXATION REMOVAL Right 03/04/2015   Procedure: REMOVAL EXTERNAL FIXATION Right Lower Leg;  Surgeon: Nadara MustardMarcus Duda V, MD;  [facility] OR;  Service: Orthopedics;  Laterality: Right;  . FRACTURE SURGERY    . HARDWARE REMOVAL Right 09/02/2014   Procedure: Removal Right Lateral Ankle Hardware, Place Antibiotic Bead ;  Surgeon: Nadara MustardMarcus Duda V, MD;  Location: [facility] OR[facility];  Service: Orthopedics;  Laterality: Right;  . INCISION AND DRAINAGE ABSCESS Right 12/19/2014   Procedure: INCISION AND DRAINAGE OF ABSCESS ON RIGHT SIDE OF CHIN;  Surgeon: Christia Readingwight Bates, MD;  [facility];  Service: ENT;  Laterality: Right;  . STUMP REVISION Right 04/14/2015   Procedure: RIGHT BELOW KNEE AMPUTATION REVISION;  Surgeon: Nadara MustardMarcus Duda V, MD;  Location: [facility] OR;  Service: Orthopedics;  Laterality: Right;  . TUBAL LIGATION     Social History   Occupational  History  . Not on file.   Social History Main Topics  . Smoking status: Current Every Day Smoker    Packs/day: 1.00    Years: 25.00    Types: Cigarettes  . Smokeless tobacco: Never Used  . Alcohol use Yes     Comment:  " 7 months ago " 01/18/15  . Drug use:     Types: Cocaine, Marijuana, Benzodiazepines     Comment: drug screen +  benzos,cocaine, marijuana pt  denies using ( denies curent use 12/17/14  . Sexual activity: Yes    Birth control/ protection: None, Surgical

## 2015-12-22 ENCOUNTER — Other Ambulatory Visit (INDEPENDENT_AMBULATORY_CARE_PROVIDER_SITE_OTHER): Payer: Self-pay | Admitting: Radiology

## 2015-12-22 MED ORDER — CYCLOBENZAPRINE HCL 10 MG PO TABS: 10.0000 mg | ORAL_TABLET | Freq: Three times a day (TID) | ORAL | 0 refills | Status: DC | PRN

## 2015-12-22 NOTE — Telephone Encounter (Signed)
I called patient to advise flex[facility]il [facility] [address] [facility] [place].

## 2016-01-05 ENCOUNTER — Telehealth (INDEPENDENT_AMBULATORY_CARE_PROVIDER_SITE_OTHER): Payer: Self-pay | Admitting: *Deleted

## 2016-01-05 NOTE — Telephone Encounter (Signed)
Pt. Called stating she is waiting to hear from [facility]. Pain MD called and needs a referral from [facility]. Pt is still in pain and is almost out of medication now. Pt. Call back number is [phone]. Pt stated she is taking 2-3 a day because 1 a day is not enough

## 2016-01-09 ENCOUNTER — Telehealth (INDEPENDENT_AMBULATORY_CARE_PROVIDER_SITE_OTHER): Payer: Self-pay | Admitting: Orthopedic Surgery

## 2016-01-09 NOTE — Telephone Encounter (Signed)
Sw Monique [name] about medications. Pt had advised the last two refill requests that she was going to pain management and it would be the last time to refill. She is awaiting her PCP to be changed on her medicaid card and states that this is the reason she is not able to get an appt. I advised that we can not refill her medications at this time. She has an appt with Dr. Lajoyce [name] on 01/18/16 and will discuss then. She has been taking Percocet tid bc of the " cold weather"

## 2016-01-09 NOTE — Telephone Encounter (Signed)
Patient called needing Rx refills on (xanax, percocet and tramadol) Patient advised she is waiting on her medicaid worker to get in touch with her so that she can switch her PCP. Patient said the new pain management clinic will not see her because the PCP has not been changed. Patient said the cold weather has made her up the pain meds. Patient said she is taking 2 1/2  Pills a day.  The number to contact her is [phone]

## 2016-01-18 ENCOUNTER — Ambulatory Visit (INDEPENDENT_AMBULATORY_CARE_PROVIDER_SITE_OTHER): Payer: Medicaid Other | Admitting: Family

## 2016-01-18 ENCOUNTER — Ambulatory Visit (INDEPENDENT_AMBULATORY_CARE_PROVIDER_SITE_OTHER): Payer: Medicaid Other | Admitting: Orthopedic Surgery

## 2016-01-18 ENCOUNTER — Encounter (INDEPENDENT_AMBULATORY_CARE_PROVIDER_SITE_OTHER): Payer: Self-pay | Admitting: Orthopedic Surgery

## 2016-01-18 VITALS — Ht 64.0 in | Wt 195.0 lb

## 2016-01-18 DIAGNOSIS — T879 Unspecified complications of amputation stump: Secondary | ICD-10-CM

## 2016-01-18 DIAGNOSIS — Z89511 Acquired absence of right leg below knee: Secondary | ICD-10-CM | POA: Diagnosis not present

## 2016-01-18 MED ORDER — OXYCODONE-ACETAMINOPHEN 10-325 MG PO TABS: 1.0000 | ORAL_TABLET | Freq: Three times a day (TID) | ORAL | 0 refills | Status: DC | PRN

## 2016-01-18 MED ORDER — ALPRAZOLAM 0.5 MG PO TABS: 0.5000 mg | ORAL_TABLET | Freq: Two times a day (BID) | ORAL | 0 refills | Status: DC | PRN

## 2016-01-18 NOTE — Progress Notes (Signed)
Office Visit Note   Patient: Monique FlemingsKimberly E Colburn           Date of Birth: 12/28/1975           MRN: [id number] Visit Date: 01/18/2016              Requested by: No referring provider defined for this encounter. PCP: [name] Patient   Assessment & Plan: Visit Diagnoses:  1. S/P unilateral BKA (below knee amputation), right (HCC)   2. BKA stump complication (HCC)     Plan: Patient still has dystrophic changes in her right transtibial amputation she does have a point with the pain clinic in December. Patient is given a refill prescription for Xanax and Percocet and a prescription for crutches. Patient has no open wounds no ulcers no cellulitis.  Follow-Up Instructions: Return if symptoms worsen or fail to improve.   Orders:  No orders of the defined types were placed in this encounter.  Meds ordered this encounter  Medications  . oxyCODONE-acetaminophen (PERCOCET) 10-325 MG tablet    Sig: Take 1 tablet by mouth every 8 (eight) hours as needed for pain.    Dispense:  30 tablet    Refill:  0  . ALPRAZolam (XANAX) 0.5 MG tablet    Sig: Take 1 tablet (0.5 mg total) by mouth 2 (two) times daily as needed for anxiety.    Dispense:  60 tablet    Refill:  0      Procedures: No procedures performed   Clinical Data: No additional findings.   Subjective: Chief Complaint  Patient presents with  . Right Knee - Routine Post Op, Pain    Right below knee amputation revision 04/14/15    Patient is status post right below knee amputation revision 04/14/15. She is ambulating with crutches and prosthetic today. She is requesting new prosthetic. She finally has appointment with pain management doctor and primary care. The patients appointment is not until mid December. She is requesting refill on medications, percocet, xanax, and tramadol. She is requesting new crutches. She is having increased pain residual limb. She is doing 45 mins of therapy daily.     Review of  Systems   Objective: Vital Signs: Ht 5\' 4"  (1.626 m)   Wt 195 lb (88.5 kg)   BMI 33.47 kg/m   Physical Exam on examination patient is alert oriented no adenopathy well-dressed normal affect normal respiratory effort she has an antalgic gait and uses crutches. Sensation right lower extremity she has no pressure points has no ulcers no cellulitis no drainage no signs of infection. She does have dystrophic skin color changes and has had temperature changes in her leg.  Ortho Exam  Specialty Comments:  No specialty comments available.  Imaging: No results found.   PMFS History: Patient Active Problem List   Diagnosis Date Noted  . 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 Polysubstance abuse 03/15/2015  . S/P unilateral BKA (below knee amputation), right (HCC) 03/15/2015  . Phantom pain 03/15/2015  . Elevated lactic acid level 03/15/2015  . BKA stump complication (HCC)   . Fever   . Acute osteomyelitis of right foot (HCC)   . Osteomyelitis (HCC) 03/02/2015  . Cellulitis 03/02/2015  . 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 Cellulitis of face   . Cellulitis and abscess 12/17/2014  . Nicotine abuse 12/17/2014  . Anemia, chronic disease 12/17/2014  . Septic arthritis of right ankle (HCC) 10/01/2014  . Septic arthritis of left ankle (  HCC) 09/30/2014  . Conjunctivitis, left eye   . Wound infection after surgery 09/29/2014  . Irritation of left eye 09/29/2014  . Wound, surgical, infected 09/29/2014  . Osteomyelitis of right ankle (HCC)   . Acute encephalopathy 08/29/2014  . Drug abuse 08/29/2014  . Cocaine abuse 08/29/2014  . Acute respiratory failure with hypoxia (HCC) 08/29/2014   Past Medical History:  Diagnosis Date  . Anemia   . Anxiety   . Arthritis   . Asthma    [facility]  . Bipolar disorder (HCC)   . Chronic back pain    "middle and lower" (12/17/2014)  . Depression   . GERD (gastroesophageal reflux disease)   . H/O blood clots   . Infection right  ankle  . Migraine    "weekly" (12/17/2014)  . MRSA infection   . Nerve damage of right foot   . Osteomyelitis of ankle (HCC)    right    Family History  Problem Relation Age of Onset  . Lung cancer Mother   . Cancer Maternal Uncle   . Cancer Maternal Grandfather   . Cancer Paternal Grandfather     Past Surgical History:  Procedure Laterality Date  . AMPUTATION Right 03/04/2015   Procedure: Right Below Knee Amputation;  Surgeon: Nadara MustardMarcus Duda V, MD;  [facility];  Service: Orthopedics;  Laterality: Right;  . ANKLE FRACTURE SURGERY Right 82016  . ANKLE FUSION Right 01/19/2015   Procedure: Right Tibiotalar Fusion, Place Antibiotic Beads;  Surgeon: Nadara MustardMarcus Duda V, MD;  Location: [facility] OR;  Service: Orthopedics;  Laterality: Right;  . DILATION AND CURETTAGE OF UTERUS  X 2  . EXTERNAL FIXATION LEG Right 01/19/2015   Procedure: EXTERNAL FIXATION Right Ankle;  Surgeon: Nadara MustardMarcus Duda V, MD;  [facility];  Service: Orthopedics;  Laterality: Right;  . EXTERNAL FIXATION REMOVAL Right 03/04/2015   Procedure: REMOVAL EXTERNAL FIXATION Right Lower Leg;  Surgeon: Nadara MustardMarcus Duda V, MD;  Location: [facility] OR;  Service: Orthopedics;  Laterality: Right;  . FRACTURE SURGERY    . HARDWARE REMOVAL Right 09/02/2014   Procedure: Removal Right Lateral Ankle Hardware, Place Antibiotic Bead ;  Surgeon: Nadara MustardMarcus Duda V, MD;  Location: [facility] ORS;  Service: Orthopedics;  Laterality: Right;  . INCISION AND DRAINAGE ABSCESS Right 12/19/2014   Procedure: INCISION AND DRAINAGE OF ABSCESS ON RIGHT SIDE OF CHIN;  Surgeon: Christia Readingwight Bates, MD;  [facility];  Service: ENT;  Laterality: Right;  . STUMP REVISION Right 04/14/2015   Procedure: RIGHT BELOW KNEE AMPUTATION REVISION;  Surgeon: Nadara MustardMarcus Duda V, MD;  Location: [facility] OR;  Service: Orthopedics;  Laterality: Right;  . TUBAL LIGATION     Social History   Occupational History  . Not on file.   Social History Main Topics  . Smoking status: Current Every Day Smoker    Packs/day: 1.00     Years: 25.00    Types: Cigarettes  . Smokeless tobacco: Never Used  . Alcohol use Yes     Comment:  " 7 months ago " 01/18/15  . Drug use:     Types: Cocaine, Marijuana, Benzodiazepines     Comment: drug screen +  benzos,cocaine, marijuana pt denies using ( denies curent use 12/17/14  . Sexual activity: Yes    Birth control/ protection: None, Surgical

## 2016-01-24 ENCOUNTER — Ambulatory Visit: Payer: Medicaid Other | Admitting: Physical Therapy

## 2016-01-24 ENCOUNTER — Ambulatory Visit: Payer: Medicaid Other | Attending: Orthopedic Surgery | Admitting: Physical Therapy

## 2016-02-02 ENCOUNTER — Encounter: Payer: Medicaid Other | Admitting: Physical Therapy

## 2016-02-02 ENCOUNTER — Ambulatory Visit: Payer: Medicaid Other | Admitting: Physical Therapy

## 2016-02-06 ENCOUNTER — Ambulatory Visit: Payer: Medicaid Other | Attending: Orthopedic Surgery | Admitting: Physical Therapy

## 2016-02-06 ENCOUNTER — Encounter: Payer: Self-pay | Admitting: Physical Therapy

## 2016-02-06 DIAGNOSIS — R2689 Other abnormalities of gait and mobility: Secondary | ICD-10-CM | POA: Diagnosis present

## 2016-02-06 DIAGNOSIS — M79605 Pain in left leg: Secondary | ICD-10-CM

## 2016-02-06 DIAGNOSIS — R2681 Unsteadiness on feet: Secondary | ICD-10-CM | POA: Insufficient documentation

## 2016-02-06 DIAGNOSIS — G8929 Other chronic pain: Secondary | ICD-10-CM | POA: Diagnosis present

## 2016-02-06 DIAGNOSIS — M6249 Contracture of muscle, multiple sites: Secondary | ICD-10-CM | POA: Insufficient documentation

## 2016-02-06 DIAGNOSIS — M6281 Muscle weakness (generalized): Secondary | ICD-10-CM | POA: Diagnosis present

## 2016-02-06 DIAGNOSIS — M25561 Pain in right knee: Secondary | ICD-10-CM | POA: Diagnosis present

## 2016-02-06 DIAGNOSIS — M545 Low back pain: Secondary | ICD-10-CM | POA: Insufficient documentation
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
## 2016-02-06 NOTE — Addendum Note (Signed)
Addended by: Fraser DinWALDRON, Patrice Matthew M on: 02/06/2016 09:24 PM   Modules accepted: Orders

## 2016-02-22 ENCOUNTER — Ambulatory Visit: Payer: Medicaid Other | Admitting: Physical Therapy

## 2016-02-22 ENCOUNTER — Telehealth: Payer: Self-pay | Admitting: Physical Therapy

## 2016-02-22 ENCOUNTER — Encounter: Payer: Self-pay | Admitting: Physical Therapy

## 2016-02-22 DIAGNOSIS — R2681 Unsteadiness on feet: Secondary | ICD-10-CM

## 2016-02-22 DIAGNOSIS — M25561 Pain in right knee: Secondary | ICD-10-CM

## 2016-02-22 DIAGNOSIS — M545 Low back pain: Secondary | ICD-10-CM

## 2016-02-22 DIAGNOSIS — M79605 Pain in left leg: Secondary | ICD-10-CM

## 2016-02-22 DIAGNOSIS — M6281 Muscle weakness (generalized): Secondary | ICD-10-CM

## 2016-02-22 DIAGNOSIS — R2689 Other abnormalities of gait and mobility: Secondary | ICD-10-CM

## 2016-02-22 DIAGNOSIS — G8929 Other chronic pain: Secondary | ICD-10-CM

## 2016-02-22 NOTE — Telephone Encounter (Signed)
Patient needs prescription for forearm crutches for prosthetic gait. Please write a prescription for forearm crutches & FAX to [phone]. Thank you Vladimir Fasterobin Hermes Wafer, PT, DPT
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
## 2016-02-23 NOTE — Telephone Encounter (Signed)
Please write prescription for forearm crutches and gait training for prosthetic training and fax to [facility].

## 2016-02-28 NOTE — Telephone Encounter (Signed)
Faxed to [facility] [phone]

## 2016-02-29 ENCOUNTER — Ambulatory Visit: Payer: Medicaid Other | Attending: Orthopedic Surgery | Admitting: Physical Therapy

## 2016-03-01 ENCOUNTER — Other Ambulatory Visit (INDEPENDENT_AMBULATORY_CARE_PROVIDER_SITE_OTHER): Payer: Self-pay | Admitting: Family

## 2016-03-01 DIAGNOSIS — Z89519 Acquired absence of unspecified leg below knee: Secondary | ICD-10-CM

## 2016-03-07 ENCOUNTER — Encounter: Payer: Self-pay | Admitting: Physical Therapy

## 2016-03-14 ENCOUNTER — Ambulatory Visit: Payer: Medicaid Other | Admitting: Physical Therapy

## 2016-03-21 ENCOUNTER — Ambulatory Visit: Payer: Medicaid Other | Admitting: Physical Therapy

## 2016-03-22 ENCOUNTER — Ambulatory Visit (INDEPENDENT_AMBULATORY_CARE_PROVIDER_SITE_OTHER): Payer: Medicaid Other | Admitting: Family

## 2016-03-23 ENCOUNTER — Ambulatory Visit (INDEPENDENT_AMBULATORY_CARE_PROVIDER_SITE_OTHER): Payer: Medicaid Other | Admitting: Family

## 2016-03-23 ENCOUNTER — Encounter (INDEPENDENT_AMBULATORY_CARE_PROVIDER_SITE_OTHER): Payer: Self-pay

## 2016-03-23 DIAGNOSIS — Z89511 Acquired absence of right leg below knee: Secondary | ICD-10-CM | POA: Diagnosis not present

## 2016-03-23 DIAGNOSIS — R52 Pain, unspecified: Secondary | ICD-10-CM

## 2016-03-23 DIAGNOSIS — G546 Phantom limb syndrome with pain: Secondary | ICD-10-CM

## 2016-03-23 MED ORDER — SULFAMETHOXAZOLE-TRIMETHOPRIM 800-160 MG PO TABS: 1.0000 | ORAL_TABLET | Freq: Two times a day (BID) | ORAL | 0 refills | Status: DC

## 2016-03-23 NOTE — Progress Notes (Signed)
Office Visit Note   Patient: Monique [name]           Date of Birth: 12/08/1975           MRN: [id number] Visit Date: 03/23/2016              Requested by: No referring provider defined for this encounter. PCP: [name] Patient   Assessment & Plan: Visit Diagnoses:  1. S/P unilateral BKA (below knee amputation), right (HCC)   2. Phantom pain     Plan: Minimize weightbearing in prosthesis until wounds have healed. Provided her today with some Tegaderm dressings that she can use when she must wear her prosthetic. Use Of antibacterial ointment and dry dressings when not wearing prosthetic. Have called in a prescription for Bactrim which she'll take for 10 days. Declined narcotic refill. Discussed that we'll no longer provide these prescriptions for her as it is inappropriate for her phantom pain.  Follow-Up Instructions: No Follow-up on file.   Orders:  No orders of the defined types were placed in this encounter.  Meds ordered this encounter  Medications  . sulfamethoxazole-trimethoprim (BACTRIM DS,SEPTRA DS) 800-160 MG tablet    Sig: Take 1 tablet by mouth 2 (two) times daily.    Dispense:  20 tablet    Refill:  0      Procedures: No procedures performed   Clinical Data: No additional findings.   Subjective: Chief Complaint  Patient presents with  . Right Leg - Pain    The patient is a [age] year old woman who presents today for evaluation of increased pain and an bearing ulcer to her right below the knee amputation. Has been working with [facility] on [employer], operationalgait training. Also has some him ulceration laterally. This is small. So when she removed her liner him a few days ago she has noticed increased drainage and a foul odor. Requests refill of her narcotics today for her phantom pain and wound pain. In pain management.    Review of Systems  Constitutional: Negative for chills and fever.  Skin: Positive for wound.     Objective: Vital Signs: There were no vitals  taken for this visit.  Physical Exam  Ortho Exam Physical Exam  Constitutional: Appears well-developed.  Head: Normocephalic.  Eyes: EOM are normal.  Neck: Normal range of motion.  Cardiovascular: Normal rate.   Pulmonary/Chest: Effort normal.  Neurological: Is alert.  Skin: Skin is warm.  Psychiatric: Has a normal mood and affect. Right below the knee amputation is well healed well consolidated. There is an ulceration laterally that is 3 mm in diameter there is exudative tissue in the wound bed. dried serous drainage surrounding. no surrounding erythema or odor today. Him there is an end bearing ulcer as well. This is 5 mm in length and 1 mm in width this has some dried blood. No surrounding erythema. No drainage or odor.  Specialty Comments:  No specialty comments available.  Imaging: No results found.   PMFS History: Patient Active Problem List   Diagnosis Date Noted  . 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 Polysubstance abuse 03/15/2015  . S/P unilateral BKA (below knee amputation), right (HCC) 03/15/2015  . Phantom pain 03/15/2015  . Elevated lactic acid level 03/15/2015  . BKA stump complication (HCC)   . Fever   . Acute osteomyelitis of right foot (HCC)   . Osteomyelitis (HCC) 03/02/2015  . Cellulitis 03/02/2015  . 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 Cellulitis of face   .  Cellulitis and abscess 12/17/2014  . Nicotine abuse 12/17/2014  . Anemia, chronic disease 12/17/2014  . Septic arthritis of right ankle (HCC) 10/01/2014  . Septic arthritis of left ankle (HCC) 09/30/2014  . Conjunctivitis, left eye   . Wound infection after surgery 09/29/2014  . Irritation of left eye 09/29/2014  . Wound, surgical, infected 09/29/2014  . Osteomyelitis of right ankle (HCC)   . Acute encephalopathy 08/29/2014  . Drug abuse 08/29/2014  . Cocaine abuse 08/29/2014  . Acute respiratory failure with hypoxia (HCC) 08/29/2014   Past Medical History:  Diagnosis  Date  . Anemia   . Anxiety   . Arthritis   . Asthma    PHM  . Bipolar disorder (HCC)   . Chronic back pain    "middle and lower" (12/17/2014)  . Depression   . GERD (gastroesophageal reflux disease)   . H/O blood clots   . Infection right ankle  . Migraine    "weekly" (12/17/2014)  . MRSA infection   . Nerve damage of right foot   . Osteomyelitis of ankle (HCC)    right    Family History  Problem Relation Age of Onset  . Lung cancer Mother   . Cancer Maternal Uncle   . Cancer Maternal Grandfather   . Cancer Paternal Grandfather     Past Surgical History:  Procedure Laterality Date  . AMPUTATION Right 03/04/2015   Procedure: Right Below Knee Amputation;  Surgeon: [name], MD;  Location: [facility] OR;  Service: Orthopedics;  Laterality: Right;  . ANKLE FRACTURE SURGERY Right 82016  . ANKLE FUSION Right 01/19/2015   Procedure: Right Tibiotalar Fusion, Place Antibiotic Beads;  Surgeon: [name], MD;  Location: [facility] OR;  Service: Orthopedics;  Laterality: Right;  . DILATION AND CURETTAGE OF UTERUS  X 2  . EXTERNAL FIXATION LEG Right 01/19/2015   Procedure: EXTERNAL FIXATION Right Ankle;  Surgeon: [name], MD;  Location: [facility] OR;  Service: Orthopedics;  Laterality: Right;  . EXTERNAL FIXATION REMOVAL Right 03/04/2015   Procedure: REMOVAL EXTERNAL FIXATION Right Lower Leg;  Surgeon: [name], MD;  Location: [facility] OR;  Service: Orthopedics;  Laterality: Right;  . FRACTURE SURGERY    . HARDWARE REMOVAL Right 09/02/2014   Procedure: Removal Right Lateral Ankle Hardware, Place Antibiotic Bead ;  Surgeon: [name], MD;  Location: [facility] ORS;  Service: Orthopedics;  Laterality: Right;  . INCISION AND DRAINAGE ABSCESS Right 12/19/2014   Procedure: INCISION AND DRAINAGE OF ABSCESS ON RIGHT SIDE OF CHIN;  Surgeon: [name], MD;  Location: [facility] OR;  Service: ENT;  Laterality: Right;  . STUMP REVISION Right 04/14/2015   Procedure: RIGHT BELOW KNEE AMPUTATION REVISION;  Surgeon: [name], MD;  Location: [facility] OR;  Service: Orthopedics;  Laterality: Right;  . TUBAL LIGATION     Social History   Occupational History  . Not on file.   Social History Main Topics  . Smoking status: Current Every Day Smoker    Packs/day: 1.00    Years: 25.00    Types: Cigarettes  . Smokeless tobacco: Never Used  . Alcohol use Yes     Comment:  " 7 months ago " 01/18/15  . Drug use: Yes    Types: Cocaine, Marijuana, Benzodiazepines     Comment: drug screen +  benzos,cocaine, marijuana pt denies using ( denies curent use 12/17/14  . Sexual activity: Yes    Birth control/ protection: None, Surgical

## 2016-03-28 ENCOUNTER — Ambulatory Visit: Payer: Medicaid Other | Admitting: Physical Therapy

## 2016-04-04 ENCOUNTER — Ambulatory Visit: Payer: Medicaid Other | Attending: Orthopedic Surgery | Admitting: Physical Therapy

## 2016-04-11 ENCOUNTER — Ambulatory Visit: Payer: Medicaid Other | Admitting: Physical Therapy

## 2016-07-08 ENCOUNTER — Inpatient Hospital Stay (HOSPITAL_COMMUNITY)
Admission: EM | Admit: 2016-07-08 | Discharge: 2016-07-13 | DRG: 493 | Disposition: A | Discharge status: Home / Self Care | Payer: Medicaid Other | Attending: Internal Medicine | Admitting: Internal Medicine

## 2016-07-08 ENCOUNTER — Emergency Department (HOSPITAL_COMMUNITY): Payer: Medicaid Other

## 2016-07-08 ENCOUNTER — Encounter (HOSPITAL_COMMUNITY): Payer: Self-pay | Admitting: Emergency Medicine

## 2016-07-08 DIAGNOSIS — T8743 Infection of amputation stump, right lower extremity: Principal | ICD-10-CM | POA: Diagnosis present

## 2016-07-08 DIAGNOSIS — E1140 Type 2 diabetes mellitus with diabetic neuropathy, unspecified: Secondary | ICD-10-CM | POA: Diagnosis present

## 2016-07-08 DIAGNOSIS — M8660 Other chronic osteomyelitis, unspecified site: Secondary | ICD-10-CM | POA: Diagnosis present

## 2016-07-08 DIAGNOSIS — F17210 Nicotine dependence, cigarettes, uncomplicated: Secondary | ICD-10-CM | POA: Diagnosis not present

## 2016-07-08 DIAGNOSIS — F319 Bipolar disorder, unspecified: Secondary | ICD-10-CM | POA: Diagnosis present

## 2016-07-08 DIAGNOSIS — E871 Hypo-osmolality and hyponatremia: Secondary | ICD-10-CM | POA: Diagnosis present

## 2016-07-08 DIAGNOSIS — B957 Other staphylococcus as the cause of diseases classified elsewhere: Secondary | ICD-10-CM | POA: Diagnosis not present

## 2016-07-08 DIAGNOSIS — D649 Anemia, unspecified: Secondary | ICD-10-CM | POA: Diagnosis present

## 2016-07-08 DIAGNOSIS — L03119 Cellulitis of unspecified part of limb: Secondary | ICD-10-CM | POA: Diagnosis present

## 2016-07-08 DIAGNOSIS — K219 Gastro-esophageal reflux disease without esophagitis: Secondary | ICD-10-CM | POA: Diagnosis present

## 2016-07-08 DIAGNOSIS — Z8739 Personal history of other diseases of the musculoskeletal system and connective tissue: Secondary | ICD-10-CM | POA: Diagnosis present

## 2016-07-08 DIAGNOSIS — Y838 Other surgical procedures as the cause of abnormal reaction of the patient, or of later complication, without mention of misadventure at the time of the procedure: Secondary | ICD-10-CM | POA: Diagnosis present

## 2016-07-08 DIAGNOSIS — T879 Unspecified complications of amputation stump: Secondary | ICD-10-CM | POA: Diagnosis present

## 2016-07-08 DIAGNOSIS — L02419 Cutaneous abscess of limb, unspecified: Secondary | ICD-10-CM | POA: Diagnosis not present

## 2016-07-08 DIAGNOSIS — F418 Other specified anxiety disorders: Secondary | ICD-10-CM

## 2016-07-08 DIAGNOSIS — Z978 Presence of other specified devices: Secondary | ICD-10-CM | POA: Diagnosis not present

## 2016-07-08 DIAGNOSIS — L03115 Cellulitis of right lower limb: Secondary | ICD-10-CM | POA: Diagnosis not present

## 2016-07-08 DIAGNOSIS — J45909 Unspecified asthma, uncomplicated: Secondary | ICD-10-CM | POA: Diagnosis present

## 2016-07-08 DIAGNOSIS — Z89511 Acquired absence of right leg below knee: Secondary | ICD-10-CM

## 2016-07-08 DIAGNOSIS — F1110 Opioid abuse, uncomplicated: Secondary | ICD-10-CM | POA: Diagnosis present

## 2016-07-08 DIAGNOSIS — G8929 Other chronic pain: Secondary | ICD-10-CM | POA: Diagnosis present

## 2016-07-08 DIAGNOSIS — Z79899 Other long term (current) drug therapy: Secondary | ICD-10-CM

## 2016-07-08 DIAGNOSIS — Y835 Amputation of limb(s) as the cause of abnormal reaction of the patient, or of later complication, without mention of misadventure at the time of the procedure: Secondary | ICD-10-CM | POA: Diagnosis not present

## 2016-07-08 DIAGNOSIS — E861 Hypovolemia: Secondary | ICD-10-CM | POA: Diagnosis present

## 2016-07-08 DIAGNOSIS — Z801 Family history of malignant neoplasm of trachea, bronchus and lung: Secondary | ICD-10-CM | POA: Diagnosis not present

## 2016-07-08 DIAGNOSIS — Z22322 Carrier or suspected carrier of Methicillin resistant Staphylococcus aureus: Secondary | ICD-10-CM | POA: Diagnosis not present

## 2016-07-08 DIAGNOSIS — E1165 Type 2 diabetes mellitus with hyperglycemia: Secondary | ICD-10-CM | POA: Diagnosis present

## 2016-07-08 DIAGNOSIS — F1910 Other psychoactive substance abuse, uncomplicated: Secondary | ICD-10-CM | POA: Diagnosis not present

## 2016-07-08 DIAGNOSIS — F1410 Cocaine abuse, uncomplicated: Secondary | ICD-10-CM | POA: Diagnosis not present

## 2016-07-08 DIAGNOSIS — E876 Hypokalemia: Secondary | ICD-10-CM | POA: Diagnosis present

## 2016-07-08 DIAGNOSIS — Z9889 Other specified postprocedural states: Secondary | ICD-10-CM | POA: Diagnosis not present

## 2016-07-08 LAB — BASIC METABOLIC PANEL
Anion gap: 10 (ref 5–15)
BUN: 17 mg/dL (ref 6–20)
CHLORIDE: 101 mmol/L (ref 101–111)
CO2: 23 mmol/L (ref 22–32)
CREATININE: 0.85 mg/dL (ref 0.44–1.00)
Calcium: 9.1 mg/dL (ref 8.9–10.3)
GFR calc Af Amer: >60 mL/min (ref >60)
Glucose, Bld: 145 mg/dL — ABNORMAL HIGH (ref 65–99)
Potassium: 3.4 mmol/L — ABNORMAL LOW (ref 3.5–5.1)
SODIUM: 134 mmol/L — AB (ref 135–145)

## 2016-07-08 LAB — C-REACTIVE PROTEIN: CRP: <0.8 mg/dL (ref <1.0)

## 2016-07-08 LAB — SEDIMENTATION RATE: Sed Rate: 6 mm/hr (ref 0–22)

## 2016-07-08 LAB — I-STAT CHEM 8, ED
BUN: 21 mg/dL — ABNORMAL HIGH (ref 6–20)
CHLORIDE: 100 mmol/L — AB (ref 101–111)
Calcium, Ion: 1.1 mmol/L — ABNORMAL LOW (ref 1.15–1.40)
Creatinine, Ser: 0.7 mg/dL (ref 0.44–1.00)
Glucose, Bld: 148 mg/dL — ABNORMAL HIGH (ref 65–99)
HCT: 41 % (ref 36.0–46.0)
HEMOGLOBIN: 13.9 g/dL (ref 12.0–15.0)
Potassium: 3.5 mmol/L (ref 3.5–5.1)
SODIUM: 136 mmol/L (ref 135–145)
TCO2: 26 mmol/L (ref 0–100)

## 2016-07-08 LAB — CBC WITH DIFFERENTIAL/PLATELET
Basophils Absolute: 0 10*3/uL (ref 0.0–0.1)
Basophils Relative: 0 %
EOS PCT: 0 %
Eosinophils Absolute: 0 10*3/uL (ref 0.0–0.7)
HCT: 40.6 % (ref 36.0–46.0)
Hemoglobin: 12.7 g/dL (ref 12.0–15.0)
LYMPHS ABS: 3 10*3/uL (ref 0.7–4.0)
LYMPHS PCT: 24 %
MCH: 25 pg — AB (ref 26.0–34.0)
MCHC: 31.3 g/dL (ref 30.0–36.0)
MCV: 80.1 fL (ref 78.0–100.0)
MONO ABS: 0.8 10*3/uL (ref 0.1–1.0)
Monocytes Relative: 6 %
Neutro Abs: 8.6 10*3/uL — ABNORMAL HIGH (ref 1.7–7.7)
Neutrophils Relative %: 70 %
PLATELETS: 335 10*3/uL (ref 150–400)
RBC: 5.07 MIL/uL (ref 3.87–5.11)
RDW: 17.4 % — AB (ref 11.5–15.5)
WBC: 12.5 10*3/uL — AB (ref 4.0–10.5)

## 2016-07-08 LAB — I-STAT BETA HCG BLOOD, ED (MC, WL, AP ONLY): I-stat hCG, quantitative: <5 m[IU]/mL (ref <5)

## 2016-07-08 LAB — I-STAT TROPONIN, ED: TROPONIN I, POC: 0 ng/mL (ref 0.00–0.08)

## 2016-07-08 MED ORDER — ONDANSETRON HCL 4 MG/2ML IJ SOLN
4.0000 mg | Freq: Four times a day (QID) | INTRAMUSCULAR | Status: DC | PRN
  Administered 2016-07-09: 4 mg via INTRAVENOUS
  Filled 2016-07-08: qty 2

## 2016-07-08 MED ORDER — OXYCODONE HCL 5 MG PO TABS
5.0000 mg | ORAL_TABLET | Freq: Four times a day (QID) | ORAL | Status: DC | PRN
  Administered 2016-07-09 – 2016-07-13 (×13): 5 mg via ORAL
  Filled 2016-07-08 (×14): qty 1

## 2016-07-08 MED ORDER — OXYCODONE-ACETAMINOPHEN 10-325 MG PO TABS: 1.0000 | ORAL_TABLET | Freq: Four times a day (QID) | ORAL | Status: DC | PRN

## 2016-07-08 MED ORDER — ARTIFICIAL TEARS OPHTHALMIC OINT
1.0000 "application " | TOPICAL_OINTMENT | Freq: Once | OPHTHALMIC | Status: AC
  Administered 2016-07-08: 1 via OPHTHALMIC
  Filled 2016-07-08: qty 3.5

## 2016-07-08 MED ORDER — POTASSIUM CHLORIDE CRYS ER 20 MEQ PO TBCR
30.0000 meq | EXTENDED_RELEASE_TABLET | Freq: Once | ORAL | Status: AC
  Administered 2016-07-08: 21:00:00 30 meq via ORAL
  Filled 2016-07-08: qty 1

## 2016-07-08 MED ORDER — LIDOCAINE 5 % EX PTCH
1.0000 | MEDICATED_PATCH | CUTANEOUS | Status: DC
  Administered 2016-07-08 – 2016-07-12 (×5): 1 via TRANSDERMAL
  Filled 2016-07-08 (×6): qty 1

## 2016-07-08 MED ORDER — CYCLOBENZAPRINE HCL 10 MG PO TABS: 10.0000 mg | ORAL_TABLET | Freq: Three times a day (TID) | ORAL | Status: DC | PRN

## 2016-07-08 MED ORDER — IBUPROFEN 800 MG PO TABS
800.0000 mg | ORAL_TABLET | Freq: Once | ORAL | Status: AC
  Administered 2016-07-08: 800 mg via ORAL
  Filled 2016-07-08: qty 1

## 2016-07-08 MED ORDER — OXYCODONE-ACETAMINOPHEN 10-325 MG PO TABS: 1.0000 | ORAL_TABLET | Freq: Three times a day (TID) | ORAL | Status: DC | PRN

## 2016-07-08 MED ORDER — ALPRAZOLAM 0.5 MG PO TABS
0.5000 mg | ORAL_TABLET | Freq: Two times a day (BID) | ORAL | Status: DC | PRN
  Administered 2016-07-09 – 2016-07-13 (×9): 0.5 mg via ORAL
  Filled 2016-07-08 (×10): qty 1

## 2016-07-08 MED ORDER — GABAPENTIN 300 MG PO CAPS: 300.0000 mg | ORAL_CAPSULE | Freq: Three times a day (TID) | ORAL | Status: DC

## 2016-07-08 MED ORDER — ENOXAPARIN SODIUM 40 MG/0.4ML ~~LOC~~ SOLN
40.0000 mg | SUBCUTANEOUS | Status: DC
  Administered 2016-07-08 – 2016-07-12 (×4): 40 mg via SUBCUTANEOUS
  Filled 2016-07-08 (×4): qty 0.4

## 2016-07-08 MED ORDER — ACETAMINOPHEN 650 MG RE SUPP: 650.0000 mg | Freq: Four times a day (QID) | RECTAL | Status: DC | PRN

## 2016-07-08 MED ORDER — CYCLOBENZAPRINE HCL 10 MG PO TABS
10.0000 mg | ORAL_TABLET | Freq: Three times a day (TID) | ORAL | Status: DC | PRN
  Administered 2016-07-08 – 2016-07-13 (×11): 10 mg via ORAL
  Filled 2016-07-08 (×11): qty 1

## 2016-07-08 MED ORDER — BUPROPION HCL ER (SR) 100 MG PO TB12
100.0000 mg | ORAL_TABLET | Freq: Two times a day (BID) | ORAL | Status: DC
  Administered 2016-07-08 – 2016-07-13 (×11): 100 mg via ORAL
  Filled 2016-07-08 (×13): qty 1

## 2016-07-08 MED ORDER — ALPRAZOLAM 0.25 MG PO TABS: 0.5000 mg | ORAL_TABLET | Freq: Two times a day (BID) | ORAL | Status: DC | PRN

## 2016-07-08 MED ORDER — OXYCODONE-ACETAMINOPHEN 5-325 MG PO TABS
1.0000 | ORAL_TABLET | Freq: Four times a day (QID) | ORAL | Status: DC | PRN
  Administered 2016-07-08 – 2016-07-13 (×15): 1 via ORAL
  Filled 2016-07-08 (×16): qty 1

## 2016-07-08 MED ORDER — ACETAMINOPHEN 325 MG PO TABS
650.0000 mg | ORAL_TABLET | Freq: Four times a day (QID) | ORAL | Status: DC | PRN
  Administered 2016-07-08 – 2016-07-10 (×7): 650 mg via ORAL
  Filled 2016-07-08 (×8): qty 2

## 2016-07-08 MED ORDER — GABAPENTIN 300 MG PO CAPS
300.0000 mg | ORAL_CAPSULE | Freq: Three times a day (TID) | ORAL | Status: DC
  Administered 2016-07-08 – 2016-07-13 (×14): 300 mg via ORAL
  Filled 2016-07-08 (×15): qty 1

## 2016-07-08 MED ORDER — SODIUM CHLORIDE 0.9 % IV SOLN
INTRAVENOUS | Status: AC
  Administered 2016-07-08 – 2016-07-09 (×2): 1 mL via INTRAVENOUS

## 2016-07-08 MED ORDER — ONDANSETRON HCL 4 MG PO TABS: 4.0000 mg | ORAL_TABLET | Freq: Four times a day (QID) | ORAL | Status: DC | PRN

## 2016-07-08 MED ORDER — BUPROPION HCL ER (SR) 100 MG PO TB12: 100.0000 mg | ORAL_TABLET | Freq: Two times a day (BID) | ORAL | Status: DC
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
## 2016-07-08 NOTE — ED Notes (Signed)
The pt is sleeping unless disturbed  Med given  Patch placed

## 2016-07-08 NOTE — H&P (Signed)
History and Physical    Monique [name] VOZ:[id number] DOB: [birth year] DOA: 07/08/2016  PCP: Patient, [name]   Patient coming from: Homeless   Chief Complaint: Many complaints, mainly BKA stump pain and redness   HPI: Monique [name] is a 41 y.o. 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 Patient is only intermittently cooperative and difficult to get a history from, but relates that she has had intermittent pain, swelling, and erythema from the right BKA stump for at least a month, reports some purulent drainage early in the course, but none recently. She denies any associated fevers or chills, but notes a nonspecific malaise that has developed over the past couple days. She has not attempted any interventions for this prior to coming in. She had endorsed some chest pain in triage, but denies that currently.  ED Course: Upon arrival to the ED, patient is found to be afebrile, saturating well on room air, slightly tachycardic, and with vitals otherwise stable. EKG features a sinus tachycardia with rate 103. Chest x-ray is negative for acute cardiopulmonary disease. Chemistry panel is notable for a sodium of 134 and potassium of 3.4. CBC features a leukocytosis to 12,500. Troponin is undetectable. Radiographs of the right BKA stump demonstrate a mild irregularity and ostiolysis at the stump with osteomyelitis not excluded. Blood cultures were obtained, patient was treated with Advil and lidocaine patch, and orthopedic surgery was consulted by the ED physician. Surgeon advised for medical admission and indicated that they will see the patient in consultation, likely for bone biopsy.  Review of Systems:  All other systems reviewed  and apart from HPI, are negative.  Past Medical History:  Diagnosis Date  . Anemia   . Anxiety   . Arthritis   . Asthma    PHM  . Bipolar disorder ([place])   . Chronic back pain    "middle and lower" (12/17/2014)  . Depression   . GERD (gastroesophageal reflux disease)   . H/O blood clots   . Infection right ankle  . Migraine    "weekly" (12/17/2014)  . MRSA infection   . Nerve damage of right foot   . Osteomyelitis of ankle ([place])    right    Past Surgical History:  Procedure Laterality Date  . AMPUTATION Right 03/04/2015   Procedure: Right Below Knee Amputation;  Surgeon: Newt Minion, MD;  Location: [place];  Service: Orthopedics;  Laterality: Right;  . ANKLE FRACTURE SURGERY Right 82016  . ANKLE FUSION Right 01/19/2015   Procedure: Right Tibiotalar Fusion, Place Antibiotic Beads;  Surgeon: Newt Minion, MD;  Location: [place];  Service: Orthopedics;  Laterality: Right;  . DILATION AND CURETTAGE OF UTERUS  X 2  . EXTERNAL FIXATION LEG Right 01/19/2015   Procedure: EXTERNAL FIXATION Right Ankle;  Surgeon: Newt Minion, MD;  Location: [place];  Service: Orthopedics;  Laterality: Right;  . EXTERNAL FIXATION REMOVAL Right 03/04/2015   Procedure: REMOVAL EXTERNAL FIXATION Right Lower Leg;  Surgeon: Newt Minion, MD;  Location: [place];  Service: Orthopedics;  Laterality: Right;  . FRACTURE SURGERY    . HARDWARE REMOVAL Right 09/02/2014   Procedure: Removal Right Lateral Ankle Hardware, Place Antibiotic Bead ;  Surgeon: Newt Minion, MD;  Location: [facility];  Service: Orthopedics;  Laterality: Right;  . INCISION AND DRAINAGE ABSCESS Right 12/19/2014   Procedure: INCISION AND DRAINAGE OF ABSCESS ON RIGHT SIDE OF CHIN;  Surgeon: Melida Quitter, MD;  Location: [place];  Service: ENT;  Laterality: Right;  . STUMP REVISION Right 04/14/2015   Procedure: RIGHT BELOW KNEE AMPUTATION REVISION;  Surgeon: Newt Minion, MD;  Location: [place];  Service: Orthopedics;  Laterality: Right;  . TUBAL LIGATION        reports that she has been smoking Cigarettes.  She has a 25.00 pack-year smoking history. She has never used smokeless tobacco. She reports that she drinks alcohol. She reports that she uses drugs, including Cocaine, Marijuana, and Benzodiazepines.  No Known Allergies  Family History  Problem Relation Age of Onset  . Lung cancer Mother   . Cancer Maternal Uncle   . Cancer Maternal Grandfather   . Cancer Paternal Grandfather      Prior to Admission medications   Medication Sig Start Date End Date Taking? Authorizing Provider  ALPRAZolam Duanne Moron) 0.5 MG tablet Take 1 tablet (0.5 mg total) by mouth 2 (two) times daily. Take 0.48m by mouth in the afternoon and take 0.541mby mouth at bedtime Patient not taking: Reported on 07/08/2016 03/24/15   BeMicheline ChapmanNP  ALPRAZolam (XDuanne Moron0.5 MG tablet Take 1 tablet (0.5 mg total) by mouth 2 (two) times daily as needed for anxiety. Patient not taking: Reported on 07/08/2016 01/18/16   DuNewt MinionMD  buPROPion ([facility]) 100 MG 12 hr tablet Take 1 tablet (100 mg total) by mouth 2 (two) times daily. 03/24/15   BeMicheline ChapmanNP  cyclobenzaprine (FLEXERIL) 10 MG tablet Take 1 tablet (10 mg total) by mouth 3 (three) times daily as needed for muscle spasms. Patient not taking: Reported on 07/08/2016 12/22/15   ZaSuzan SlickNP  ferrous sulfate 325 (65 FE) MG tablet Take 1 tablet (325 mg total) by mouth daily with breakfast. Patient not taking: Reported on 07/08/2016 03/28/15   BeMicheline ChapmanNP  gabapentin (NEURONTIN) 300 MG capsule Take 1 capsule (300 mg total) by mouth 3 (three) times daily. Patient not taking: Reported on 07/08/2016 03/24/15   BeMicheline ChapmanNP  methocarbamol (ROBAXIN) 500 MG tablet Take 1 tablet (500 mg total) by mouth every 6 (six) hours as needed for muscle spasms. Patient not taking: Reported on 07/08/2016 03/24/15   BeMicheline ChapmanNP  oxyCODONE (OXYCONTIN) 20 mg 12 hr tablet Take 1 tablet (20 mg  total) by mouth every 12 (twelve) hours. Patient not taking: Reported on 07/08/2016 03/15/15   Dowless, SaAldona Barripp, PA-C  Oxycodone HCl 10 MG TABS Take 1 tablet (10 mg total) by mouth every 6 (six) hours as needed. Patient not taking: Reported on 07/08/2016 03/07/15   AbReyne DumasMD  oxyCODONE-acetaminophen (PERCOCET) 10-325 MG tablet Take 1 tablet by mouth every 8 (eight) hours as needed for pain. Patient not taking: Reported on 07/08/2016 01/18/16   DuNewt MinionMD  senna-docusate (SENOKOT-S) 8.6-50 MG tablet Take 1 tablet by mouth at bedtime as needed for mild constipation. Patient not taking: Reported on 07/08/2016 03/06/15   AbReyne DumasMD    Physical Exam: Vitals:   07/08/16 1615 07/08/16 1730 07/08/16 1815 07/08/16 1900  BP: 115/64 112/62 (!) 100/57 (!) 100/55  Pulse: 96 89 87 78  Resp:   20   Temp:      SpO2: 94% 95% 92% 95%      Constitutional: No respiratory  distress. No pallor. No diaphoresis.  Eyes: PERTLA, lids and conjunctivae normal ENMT: Mucous membranes are moist. Posterior pharynx clear of any exudate or lesions.   Neck: normal, supple, no masses, no thyromegaly Respiratory: clear to auscultation bilaterally, no wheezing, no crackles. Normal respiratory effort. Cardiovascular: Rate ~110 and regular. No extremity edema. No significant JVD. Abdomen: No distension, no tenderness, no masses palpated. Bowel sounds active.  Musculoskeletal: no clubbing / cyanosis. Status-post right BKA with erythema and tenderness at stump, no drainage.  Skin: no significant rashes, lesions, ulcers. Warm, dry, well-perfused. Neurologic: CN 2-12 grossly intact. Sensation intact, DTR normal. Strength 5/5 in all 4 limbs.  Psychiatric: Alert and oriented x 3. Intermittently cooperative.    Labs on Admission: I have personally reviewed following labs and imaging studies  CBC:  Recent Labs Lab 07/08/16 1640 07/08/16 1649  WBC 12.5*  --   NEUTROABS 8.6*  --   HGB 12.7 13.9  HCT  40.6 41.0  MCV 80.1  --   PLT 335  --    Basic Metabolic Panel:  Recent Labs Lab 07/08/16 1640 07/08/16 1649  NA 134* 136  K 3.4* 3.5  CL 101 100*  CO2 23  --   GLUCOSE 145* 148*  BUN 17 21*  CREATININE 0.85 0.70  CALCIUM 9.1  --    GFR: CrCl cannot be calculated (Unknown ideal weight.). Liver Function Tests: No results for input(s): AST, ALT, ALKPHOS, BILITOT, PROT, ALBUMIN in the last 168 hours. No results for input(s): LIPASE, AMYLASE in the last 168 hours. No results for input(s): AMMONIA in the last 168 hours. Coagulation Profile: No results for input(s): INR, PROTIME in the last 168 hours. Cardiac Enzymes: No results for input(s): CKTOTAL, CKMB, CKMBINDEX, TROPONINI in the last 168 hours. BNP (last 3 results) No results for input(s): PROBNP in the last 8760 hours. HbA1C: No results for input(s): HGBA1C in the last 72 hours. CBG: No results for input(s): GLUCAP in the last 168 hours. Lipid Profile: No results for input(s): CHOL, HDL, LDLCALC, TRIG, CHOLHDL, LDLDIRECT in the last 72 hours. Thyroid Function Tests: No results for input(s): TSH, T4TOTAL, FREET4, T3FREE, THYROIDAB in the last 72 hours. Anemia Panel: No results for input(s): VITAMINB12, FOLATE, FERRITIN, TIBC, IRON, RETICCTPCT in the last 72 hours. Urine analysis:    Component Value Date/Time   COLORURINE AMBER (A) 11/09/2015 0000   APPEARANCEUR TURBID (A) 11/09/2015 0000   LABSPEC 1.023 11/09/2015 0000   PHURINE 5.5 11/09/2015 0000   GLUCOSEU NEGATIVE 11/09/2015 0000   HGBUR MODERATE (A) 11/09/2015 0000   BILIRUBINUR SMALL (A) 11/09/2015 0000   KETONESUR NEGATIVE 11/09/2015 0000   PROTEINUR 100 (A) 11/09/2015 0000   UROBILINOGEN 1.0 08/29/2014 1445   NITRITE NEGATIVE 11/09/2015 0000   LEUKOCYTESUR LARGE (A) 11/09/2015 0000   Sepsis Labs: _0 (procalcitonin:4,lacticidven:4) )No results found for this or any previous visit (from the past 240 hour(s)).   Radiological Exams on  Admission: Dg Chest 2 View  Result Date: 07/08/2016 CLINICAL DATA:  Chest pain. EXAM: CHEST  2 VIEW COMPARISON:  March 15, 2015 FINDINGS: The heart size and mediastinal contours are within normal limits. Both lungs are clear. The visualized skeletal structures are unremarkable. IMPRESSION: No active cardiopulmonary disease. Electronically Signed   By: Dorise Bullion III M.D   On: 07/08/2016 16:20   Dg Knee 2 Views Right  Result Date: 07/08/2016 CLINICAL DATA:  Chest pain. EXAM: RIGHT KNEE - 1-2 VIEW COMPARISON:  None. FINDINGS: The patient is status post a below-the-knee amputation.  No soft tissue gas is identified. No fractures or dislocations. The distal stump of the fibula has changed in configuration since the March 15, 2015 study. IMPRESSION: The patient is status post amputation. No soft tissue gas identified. The distal stump of the remaining fibula has changed in configuration since the immediate post amputation images March 15, 2015. There is mild irregularity and bony osteolysis in this region. Osteomyelitis not excluded. Electronically Signed   By: Dorise Bullion III M.D   On: 07/08/2016 16:24    EKG: Independently reviewed. Sinus tachycardia (rate 103)   Assessment/Plan  1. Right BKA stump pain and redness, ?underlying osteomyelitis  - Pt presents with pain and erythema at stump site for weeks, general malaise for past couple days  - Found to have mild leukocytosis, no fever, stump radiographs with bony irregularity possibly reflective of osteomyelitis  - Orthopedic surgery is consulting and much appreciated; will follow-up on recommendations  - Blood cultures obtained in ED  - She is afebrile and hemodynamically stable; will hold-off on abx pending possible bone culture   - ESR and CRP pending    2. Polysubstance abuse  - Pt with hx of cocaine, opiate abuse, hx of IVDA  - UDS pending  - Social work consultation requested    3. Hypokalemia  - Mild, no EKG change  -  Treated with 30 mEq oral potassium  - Repeat chem panel in am   4. Hyponatremia  - Mild, likely secondary to hypovolemia  - Continue IVF hydration with NS and repeat chem panel in am    5. Chronic pain  - Pt complains of pain at stump site  - Continue home regimen of Percocet and Flexeril   6. Depression, anxiety  - Pt not very cooperative with interview, difficult to assess  - Continue Wellbutrin and prn Xanax     DVT prophylaxis: sq Lovenox  Code Status: Full  Family Communication: Discussed with patient Disposition Plan: Admit to med-surg Consults called: None Admission status: Inpatient    Vianne Bulls, MD [facility] Pager [phone]  If 7PM-7AM, please contact night-coverage [email or web address] Password [facility]  07/08/2016, 8:32 PM

## 2016-07-08 NOTE — ED Provider Notes (Signed)
[facility]-EMERGENCY DEPT Provider Note   CSN: [id number] Arrival date & time: 07/08/16  1458     History   Chief Complaint Chief Complaint  Patient presents with  . Chest Pain    HPI Monique [name] is a 41 y.o. female.  Patient with a comp located past medical history including IV drug use, osteomyelitis status post right BKA. Over the last 3 weeks, patient has developed increasing pain in her right leg near her stump. Reports nausea, fatigue, chills over the last 24-48 hours. No vomiting or diarrhea. No other infectious symptoms.   The history is provided by the patient.  Illness  This is a recurrent problem. Episode onset: 3wks. The problem occurs constantly. The problem has been gradually worsening. Pertinent negatives include no chest pain, no abdominal pain and no shortness of breath. She has tried nothing for the symptoms.    Past Medical History:  Diagnosis Date  . Anemia   . Anxiety   . Arthritis   . Asthma    PHM  . Bipolar disorder (HCC)   . Chronic back pain    "middle and lower" (12/17/2014)  . Depression   . GERD (gastroesophageal reflux disease)   . H/O blood clots   . Infection right ankle  . Migraine    "weekly" (12/17/2014)  . MRSA infection   . Nerve damage of right foot   . Osteomyelitis of ankle [facility])    right    Patient Active Problem List   Diagnosis Date Noted  . 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 Polysubstance abuse 03/15/2015  . S/P unilateral BKA (below knee amputation), right (HCC) 03/15/2015  . Phantom pain (HCC) 03/15/2015  . Elevated lactic acid level 03/15/2015  . BKA stump complication (HCC)   . Fever   . Acute osteomyelitis of right foot (HCC)   . Osteomyelitis (HCC) 03/02/2015  . Cellulitis 03/02/2015  . 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 Cellulitis of face   . Cellulitis and abscess 12/17/2014  . Nicotine abuse 12/17/2014  . Anemia, chronic disease 12/17/2014  . Septic arthritis of right ankle  (HCC) 10/01/2014  . Septic arthritis of left ankle (HCC) 09/30/2014  . Conjunctivitis, left eye   . Wound infection after surgery 09/29/2014  . Irritation of left eye 09/29/2014  . Wound, surgical, infected 09/29/2014  . Osteomyelitis of right ankle (HCC)   . Acute encephalopathy 08/29/2014  . Drug abuse 08/29/2014  . Cocaine abuse 08/29/2014  . Acute respiratory failure with hypoxia (HCC) 08/29/2014    Past Surgical History:  Procedure Laterality Date  . AMPUTATION Right 03/04/2015   Procedure: Right Below Knee Amputation;  Surgeon: [name], MD;  Location: [facility] OR;  Service: Orthopedics;  Laterality: Right;  . ANKLE FRACTURE SURGERY Right 82016  . ANKLE FUSION Right 01/19/2015   Procedure: Right Tibiotalar Fusion, Place Antibiotic Beads;  Surgeon: [name], MD;  Location: [facility] OR;  Service: Orthopedics;  Laterality: Right;  . DILATION AND CURETTAGE OF UTERUS  X 2  . EXTERNAL FIXATION LEG Right 01/19/2015   Procedure: EXTERNAL FIXATION Right Ankle;  Surgeon: [name], MD;  Location: [facility] OR;  Service: Orthopedics;  Laterality: Right;  . EXTERNAL FIXATION REMOVAL Right 03/04/2015   Procedure: REMOVAL EXTERNAL FIXATION Right Lower Leg;  Surgeon: [name], MD;  Location: [facility] OR;  Service: Orthopedics;  Laterality: Right;  . FRACTURE SURGERY    . HARDWARE REMOVAL Right 09/02/2014   Procedure: Removal Right Lateral  Ankle Hardware, Place Antibiotic Bead ;  Surgeon: [name], MD;  Location: [facility] ORS;  Service: Orthopedics;  Laterality: Right;  . INCISION AND DRAINAGE ABSCESS Right 12/19/2014   Procedure: INCISION AND DRAINAGE OF ABSCESS ON RIGHT SIDE OF CHIN;  Surgeon: [name], MD;  Location: [facility] OR;  Service: ENT;  Laterality: Right;  . STUMP REVISION Right 04/14/2015   Procedure: RIGHT BELOW KNEE AMPUTATION REVISION;  Surgeon: [name], MD;  Location: [facility] OR;  Service: Orthopedics;  Laterality: Right;  . TUBAL LIGATION      OB History    No data available        Home Medications    Prior to Admission medications   Medication Sig Start Date End Date Taking? Authorizing Provider  ALPRAZolam Prudy Feeler) 0.5 MG tablet Take 1 tablet (0.5 mg total) by mouth 2 (two) times daily. Take 0.5mg  by mouth in the afternoon and take 0.5mg  by mouth at bedtime Patient not taking: Reported on 07/08/2016 03/24/15   Henrietta Hoover, NP  ALPRAZolam Prudy Feeler) 0.5 MG tablet Take 1 tablet (0.5 mg total) by mouth 2 (two) times daily as needed for anxiety. Patient not taking: Reported on 07/08/2016 01/18/16   [name], MD  buPROPion [facility] SR) 100 MG 12 hr tablet Take 1 tablet (100 mg total) by mouth 2 (two) times daily. 03/24/15   Henrietta Hoover, NP  cyclobenzaprine (FLEXERIL) 10 MG tablet Take 1 tablet (10 mg total) by mouth 3 (three) times daily as needed for muscle spasms. Patient not taking: Reported on 07/08/2016 12/22/15   Adonis Huguenin, NP  doxycycline (VIBRAMYCIN) 100 MG capsule Take 1 capsule (100 mg total) by mouth 2 (two) times daily. Patient not taking: Reported on 07/08/2016 11/09/15   Jaynie Crumble, PA-C  ferrous sulfate 325 (65 FE) MG tablet Take 1 tablet (325 mg total) by mouth daily with breakfast. Patient not taking: Reported on 07/08/2016 03/28/15   Henrietta Hoover, NP  gabapentin (NEURONTIN) 300 MG capsule Take 1 capsule (300 mg total) by mouth 3 (three) times daily. Patient not taking: Reported on 07/08/2016 03/24/15   Henrietta Hoover, NP  methocarbamol (ROBAXIN) 500 MG tablet Take 1 tablet (500 mg total) by mouth every 6 (six) hours as needed for muscle spasms. Patient not taking: Reported on 07/08/2016 03/24/15   Henrietta Hoover, NP  oxyCODONE (OXYCONTIN) 20 mg 12 hr tablet Take 1 tablet (20 mg total) by mouth every 12 (twelve) hours. Patient not taking: Reported on 07/08/2016 03/15/15   Dowless, Lelon Mast Tripp, PA-C  Oxycodone HCl 10 MG TABS Take 1 tablet (10 mg total) by mouth every 6 (six) hours as needed. Patient not taking:  Reported on 07/08/2016 03/07/15   Richarda Overlie, MD  oxyCODONE-acetaminophen (PERCOCET) 10-325 MG tablet Take 1 tablet by mouth every 8 (eight) hours as needed for pain. Patient not taking: Reported on 07/08/2016 01/18/16   [name], MD  senna-docusate (SENOKOT-S) 8.6-50 MG tablet Take 1 tablet by mouth at bedtime as needed for mild constipation. Patient not taking: Reported on 07/08/2016 03/06/15   Richarda Overlie, MD  sulfamethoxazole-trimethoprim (BACTRIM DS,SEPTRA DS) 800-160 MG tablet Take 1 tablet by mouth 2 (two) times daily. Patient not taking: Reported on 07/08/2016 03/23/16   Adonis Huguenin, NP    Family History Family History  Problem Relation Age of Onset  . Lung cancer Mother   . Cancer Maternal Uncle   . Cancer Maternal Grandfather   . Cancer Paternal Grandfather  Social History Social History  Substance Use Topics  . Smoking status: Current Every Day Smoker    Packs/day: 1.00    Years: 25.00    Types: Cigarettes  . Smokeless tobacco: Never Used  . Alcohol use Yes     Comment:  " 7 months ago " 01/18/15     Allergies   Patient has no known allergies.   Review of Systems Review of Systems  Constitutional: Positive for chills and fatigue.  Respiratory: Negative for shortness of breath.   Cardiovascular: Negative for chest pain.  Gastrointestinal: Positive for nausea. Negative for abdominal pain, blood in stool, diarrhea and vomiting.  Skin:       Erythema, warmth near distal R stump  All other systems reviewed and are negative.    Physical Exam Updated Vital Signs BP 115/64   Pulse 96   Temp 98.4 F (36.9 C)   Resp (!) 24   LMP 06/25/2016   SpO2 94%   Physical Exam  Constitutional: She appears well-developed and well-nourished. No distress.  HENT:  Head: Normocephalic and atraumatic.  Eyes: Conjunctivae are normal.  Neck: Neck supple.  Cardiovascular: Regular rhythm.  Tachycardia present.   No murmur heard. Pulmonary/Chest: Effort normal and  breath sounds normal. No respiratory distress. She has no wheezes. She has no rhonchi.  Abdominal: Soft. There is no tenderness.  Musculoskeletal: She exhibits no edema.  Right below the knee amputation. Mild erythema and warmth overlying distal stump. Tender to palpation from the knee to the stump.  Neurological: She is alert.  Skin: Skin is warm and dry.  Psychiatric: She has a normal mood and affect.  Nursing note and vitals reviewed.    ED Treatments / Results  Labs (all labs ordered are listed, but only abnormal results are displayed) Labs Reviewed  BASIC METABOLIC PANEL - Abnormal; Notable for the following:       Result Value   Sodium 134 (*)    Potassium 3.4 (*)    Glucose, Bld 145 (*)    All other components within normal limits  CBC WITH DIFFERENTIAL/PLATELET - Abnormal; Notable for the following:    WBC 12.5 (*)    MCH 25.0 (*)    RDW 17.4 (*)    Neutro Abs 8.6 (*)    All other components within normal limits  I-STAT CHEM 8, ED - Abnormal; Notable for the following:    Chloride 100 (*)    BUN 21 (*)    Glucose, Bld 148 (*)    Calcium, Ion 1.10 (*)    All other components within normal limits  CULTURE, BLOOD (ROUTINE X 2)  CULTURE, BLOOD (ROUTINE X 2)  URINALYSIS, ROUTINE W REFLEX MICROSCOPIC  RAPID URINE DRUG SCREEN, HOSP PERFORMED  SEDIMENTATION RATE  C-REACTIVE PROTEIN  I-STAT TROPOININ, ED  I-STAT BETA HCG BLOOD, ED ([facility], [facility], [facility] ONLY)    EKG  EKG Interpretation None       Radiology Dg Chest 2 View  Result Date: 07/08/2016 CLINICAL DATA:  Chest pain. EXAM: CHEST  2 VIEW COMPARISON:  March 15, 2015 FINDINGS: The heart size and mediastinal contours are within normal limits. Both lungs are clear. The visualized skeletal structures are unremarkable. IMPRESSION: No active cardiopulmonary disease. Electronically Signed   By: Gerome Samavid  Williams III M.D   On: 07/08/2016 16:20   Dg Knee 2 Views Right  Result Date: 07/08/2016 CLINICAL DATA:  Chest pain. EXAM:  RIGHT KNEE - 1-2 VIEW COMPARISON:  None. FINDINGS: The patient is status  post a below-the-knee amputation. No soft tissue gas is identified. No fractures or dislocations. The distal stump of the fibula has changed in configuration since the March 15, 2015 study. IMPRESSION: The patient is status post amputation. No soft tissue gas identified. The distal stump of the remaining fibula has changed in configuration since the immediate post amputation images March 15, 2015. There is mild irregularity and bony osteolysis in this region. Osteomyelitis not excluded. Electronically Signed   By: Gerome Sam III M.D   On: 07/08/2016 16:24    Procedures Procedures (including critical care time)  Medications Ordered in ED Medications  lidocaine (LIDODERM) 5 % 1 patch (1 patch Transdermal Patch Applied 07/08/16 1718)  ibuprofen (ADVIL,MOTRIN) tablet 800 mg (800 mg Oral Given 07/08/16 1717)  artificial tears (LACRILUBE) ophthalmic ointment 1 application (1 application Both Eyes Given 07/08/16 1718)     Initial Impression / Assessment and Plan / ED Course  I have reviewed the triage vital signs and the nursing notes.  Pertinent labs & imaging results that were available during my care of the patient were reviewed by me and considered in my medical decision making (see chart for details).     On arrival here, the patient does have multiple complaints, but her primary complaint is pain near her stump. There is some mild erythema and warmth overlying the stumps. X-ray shows question of osteomyelitis overlying the distal remaining fibula of the stump. She does have a leukocytosis. Spoke with orthopedics. At their request, admitting the patient to the hospitalist, we will hold off on antibiotics, and patient will likely have a biopsy while inpatient.  Patient was also complaining of some chest pain. No evidence of pneumothorax or pneumonia on chest x-ray. Mediastinum is narrow. Description of symptoms not  consistent with aortic dissection or pulmonary embolism.  Final Clinical Impressions(s) / ED Diagnoses   Final diagnoses:  History of amputation of right lower extremity through tibia and fibula [facility])  History of osteomyelitis    New Prescriptions New Prescriptions   No medications on file     Lindalou Hose, MD 07/08/16 [id number]    Margarita Grizzle, MD 07/14/16 [phone]

## 2016-07-08 NOTE — ED Notes (Signed)
lidoderm patch placed over the  Stump for pain

## 2016-07-08 NOTE — ED Notes (Signed)
To xray and back  Food given when nshe arrived by the day nurse

## 2016-07-09 ENCOUNTER — Other Ambulatory Visit (INDEPENDENT_AMBULATORY_CARE_PROVIDER_SITE_OTHER): Payer: Self-pay | Admitting: Family

## 2016-07-09 ENCOUNTER — Encounter (HOSPITAL_COMMUNITY): Payer: Self-pay | Admitting: *Deleted

## 2016-07-09 DIAGNOSIS — Z89511 Acquired absence of right leg below knee: Secondary | ICD-10-CM

## 2016-07-09 DIAGNOSIS — Z8739 Personal history of other diseases of the musculoskeletal system and connective tissue: Secondary | ICD-10-CM

## 2016-07-09 DIAGNOSIS — F418 Other specified anxiety disorders: Secondary | ICD-10-CM

## 2016-07-09 LAB — URINALYSIS, ROUTINE W REFLEX MICROSCOPIC
Bilirubin Urine: NEGATIVE
Glucose, UA: NEGATIVE mg/dL
HGB URINE DIPSTICK: NEGATIVE
Ketones, ur: NEGATIVE mg/dL
NITRITE: NEGATIVE
PROTEIN: 100 mg/dL — AB
SPECIFIC GRAVITY, URINE: 1.027 (ref 1.005–1.030)
pH: 5 (ref 5.0–8.0)

## 2016-07-09 LAB — BASIC METABOLIC PANEL
Anion gap: 8 (ref 5–15)
BUN: 23 mg/dL — AB (ref 6–20)
CALCIUM: 8.3 mg/dL — AB (ref 8.9–10.3)
CO2: 25 mmol/L (ref 22–32)
CREATININE: 0.85 mg/dL (ref 0.44–1.00)
Chloride: 104 mmol/L (ref 101–111)
GFR calc Af Amer: >60 mL/min (ref >60)
GFR calc non Af Amer: >60 mL/min (ref >60)
Glucose, Bld: 116 mg/dL — ABNORMAL HIGH (ref 65–99)
Potassium: 3.9 mmol/L (ref 3.5–5.1)
SODIUM: 137 mmol/L (ref 135–145)

## 2016-07-09 LAB — RAPID URINE DRUG SCREEN, HOSP PERFORMED
AMPHETAMINES: NOT DETECTED
Barbiturates: NOT DETECTED
Benzodiazepines: NOT DETECTED
COCAINE: POSITIVE — AB
OPIATES: NOT DETECTED
TETRAHYDROCANNABINOL: NOT DETECTED

## 2016-07-09 LAB — BLOOD CULTURE ID PANEL (REFLEXED)
Acinetobacter baumannii: NOT DETECTED
CANDIDA KRUSEI: NOT DETECTED
Candida albicans: NOT DETECTED
Candida glabrata: NOT DETECTED
Candida parapsilosis: NOT DETECTED
Candida tropicalis: NOT DETECTED
ENTEROCOCCUS SPECIES: NOT DETECTED
ESCHERICHIA COLI: NOT DETECTED
Enterobacter cloacae complex: NOT DETECTED
Enterobacteriaceae species: NOT DETECTED
Haemophilus influenzae: NOT DETECTED
Klebsiella oxytoca: NOT DETECTED
Klebsiella pneumoniae: NOT DETECTED
LISTERIA MONOCYTOGENES: NOT DETECTED
Methicillin resistance: NOT DETECTED
Neisseria meningitidis: NOT DETECTED
PROTEUS SPECIES: NOT DETECTED
Pseudomonas aeruginosa: NOT DETECTED
SERRATIA MARCESCENS: NOT DETECTED
STAPHYLOCOCCUS AUREUS BCID: NOT DETECTED
STAPHYLOCOCCUS SPECIES: DETECTED — AB
STREPTOCOCCUS AGALACTIAE: NOT DETECTED
Streptococcus pneumoniae: NOT DETECTED
Streptococcus pyogenes: NOT DETECTED
Streptococcus species: NOT DETECTED

## 2016-07-09 LAB — MAGNESIUM: Magnesium: 2.1 mg/dL (ref 1.7–2.4)

## 2016-07-09 LAB — CBC
HCT: 36.4 % (ref 36.0–46.0)
Hemoglobin: 11.2 g/dL — ABNORMAL LOW (ref 12.0–15.0)
MCH: 24.9 pg — ABNORMAL LOW (ref 26.0–34.0)
MCHC: 30.5 g/dL (ref 30.0–36.0)
MCV: 81.6 fL (ref 78.0–100.0)
PLATELETS: 295 10*3/uL (ref 150–400)
RBC: 4.46 MIL/uL (ref 3.87–5.11)
RDW: 17.4 % — AB (ref 11.5–15.5)
WBC: 6.3 10*3/uL (ref 4.0–10.5)

## 2016-07-09 LAB — MRSA PCR SCREENING: MRSA BY PCR: POSITIVE — AB

## 2016-07-09 MED ORDER — CHLORHEXIDINE GLUCONATE CLOTH 2 % EX PADS
6.0000 | MEDICATED_PAD | Freq: Every day | CUTANEOUS | Status: DC
  Administered 2016-07-09 – 2016-07-13 (×4): 6 via TOPICAL

## 2016-07-09 MED ORDER — MUPIROCIN 2 % EX OINT
1.0000 "application " | TOPICAL_OINTMENT | Freq: Two times a day (BID) | CUTANEOUS | Status: DC
  Administered 2016-07-09 – 2016-07-13 (×8): 1 via NASAL
  Filled 2016-07-09: qty 22

## 2016-07-09 NOTE — Consult Note (Signed)
 ORTHOPAEDIC CONSULTATION  REQUESTING PHYSICIAN: Rai, Ripudeep K, MD  Chief Complaint: Painful draining transtibial amputation on the right.  HPI: Monique [name] is a 40 y.o. female who presents with a area over the distal fibula with cellulitis which occasionally drains and is painful  Past Medical History:  Diagnosis Date  . Anemia   . Anxiety   . Arthritis   . Asthma    PHM  . Bipolar disorder (HCC)   . Chronic back pain    "middle and lower" (12/17/2014)  . Depression   . GERD (gastroesophageal reflux disease)   . H/O blood clots   . Infection right ankle  . Migraine    "weekly" (12/17/2014)  . MRSA infection   . Nerve damage of right foot   . Osteomyelitis of ankle (HCC)    right   Past Surgical History:  Procedure Laterality Date  . AMPUTATION Right 03/04/2015   Procedure: Right Below Knee Amputation;  Surgeon: Marcus Duda V, MD;  Location: [facility] OR;  Service: Orthopedics;  Laterality: Right;  . ANKLE FRACTURE SURGERY Right 82016  . ANKLE FUSION Right 01/19/2015   Procedure: Right Tibiotalar Fusion, Place Antibiotic Beads;  Surgeon: Marcus Duda V, MD;  Location: [facility] OR;  Service: Orthopedics;  Laterality: Right;  . DILATION AND CURETTAGE OF UTERUS  X 2  . EXTERNAL FIXATION LEG Right 01/19/2015   Procedure: EXTERNAL FIXATION Right Ankle;  Surgeon: Marcus Duda V, MD;  Location: [facility] OR;  Service: Orthopedics;  Laterality: Right;  . EXTERNAL FIXATION REMOVAL Right 03/04/2015   Procedure: REMOVAL EXTERNAL FIXATION Right Lower Leg;  Surgeon: Marcus Duda V, MD;  Location: [facility] OR;  Service: Orthopedics;  Laterality: Right;  . FRACTURE SURGERY    . HARDWARE REMOVAL Right 09/02/2014   Procedure: Removal Right Lateral Ankle Hardware, Place Antibiotic Bead ;  Surgeon: Marcus Duda V, MD;  Location: [facility] ORS;  Service: Orthopedics;  Laterality: Right;  . INCISION AND DRAINAGE ABSCESS Right 12/19/2014   Procedure: INCISION AND DRAINAGE OF ABSCESS ON RIGHT SIDE OF CHIN;  Surgeon: Dwight  Bates, MD;  Location: [facility] OR;  Service: ENT;  Laterality: Right;  . STUMP REVISION Right 04/14/2015   Procedure: RIGHT BELOW KNEE AMPUTATION REVISION;  Surgeon: Marcus Duda V, MD;  Location: [facility] OR;  Service: Orthopedics;  Laterality: Right;  . TUBAL LIGATION     Social History   Social History  . Marital status: Legally Separated    Spouse name: N/A  . Number of children: N/A  . Years of education: N/A   Social History Main Topics  . Smoking status: Current Every Day Smoker    Packs/day: 1.00    Years: 25.00    Types: Cigarettes  . Smokeless tobacco: Never Used  . Alcohol use Yes     Comment:  " 7 months ago " 01/18/15  . Drug use: Yes    Types: Cocaine, Marijuana, Benzodiazepines     Comment: drug screen +  benzos,cocaine, marijuana pt denies using ( denies curent use 12/17/14  . Sexual activity: Yes    Birth control/ protection: None, Surgical   Other Topics Concern  . None   Social History Narrative   ** Merged History Encounter **       Family History  Problem Relation Age of Onset  . Lung cancer Mother   . Cancer Maternal Uncle   . Cancer Maternal Grandfather   . Cancer Paternal Grandfather    - negative except otherwise stated in the family   history section No Known Allergies Prior to Admission medications   Medication Sig Start Date End Date Taking? Authorizing Provider  ALPRAZolam (XANAX) 0.5 MG tablet Take 1 tablet (0.5 mg total) by mouth 2 (two) times daily. Take 0.5mg by mouth in the afternoon and take 0.5mg by mouth at bedtime Patient not taking: Reported on 07/08/2016 03/24/15   Bernhardt, Linda C, NP  ALPRAZolam (XANAX) 0.5 MG tablet Take 1 tablet (0.5 mg total) by mouth 2 (two) times daily as needed for anxiety. Patient not taking: Reported on 07/08/2016 01/18/16   Duda, Marcus V, MD  buPROPion (WELLBUTRIN SR) 100 MG 12 hr tablet Take 1 tablet (100 mg total) by mouth 2 (two) times daily. 03/24/15   Bernhardt, Linda C, NP  cyclobenzaprine (FLEXERIL) 10 MG  tablet Take 1 tablet (10 mg total) by mouth 3 (three) times daily as needed for muscle spasms. Patient not taking: Reported on 07/08/2016 12/22/15   Zamora, Erin R, NP  ferrous sulfate 325 (65 FE) MG tablet Take 1 tablet (325 mg total) by mouth daily with breakfast. Patient not taking: Reported on 07/08/2016 03/28/15   Bernhardt, Linda C, NP  gabapentin (NEURONTIN) 300 MG capsule Take 1 capsule (300 mg total) by mouth 3 (three) times daily. Patient not taking: Reported on 07/08/2016 03/24/15   Bernhardt, Linda C, NP  methocarbamol (ROBAXIN) 500 MG tablet Take 1 tablet (500 mg total) by mouth every 6 (six) hours as needed for muscle spasms. Patient not taking: Reported on 07/08/2016 03/24/15   Bernhardt, Linda C, NP  oxyCODONE (OXYCONTIN) 20 mg 12 hr tablet Take 1 tablet (20 mg total) by mouth every 12 (twelve) hours. Patient not taking: Reported on 07/08/2016 03/15/15   Dowless, Samantha Tripp, PA-C  Oxycodone HCl 10 MG TABS Take 1 tablet (10 mg total) by mouth every 6 (six) hours as needed. Patient not taking: Reported on 07/08/2016 03/07/15   Abrol, Nayana, MD  oxyCODONE-acetaminophen (PERCOCET) 10-325 MG tablet Take 1 tablet by mouth every 8 (eight) hours as needed for pain. Patient not taking: Reported on 07/08/2016 01/18/16   Duda, Marcus V, MD  senna-docusate (SENOKOT-S) 8.6-50 MG tablet Take 1 tablet by mouth at bedtime as needed for mild constipation. Patient not taking: Reported on 07/08/2016 03/06/15   Abrol, Nayana, MD   Dg Chest 2 View  Result Date: 07/08/2016 CLINICAL DATA:  Chest pain. EXAM: CHEST  2 VIEW COMPARISON:  March 15, 2015 FINDINGS: The heart size and mediastinal contours are within normal limits. Both lungs are clear. The visualized skeletal structures are unremarkable. IMPRESSION: No active cardiopulmonary disease. Electronically Signed   By: David  Williams III M.D   On: 07/08/2016 16:20   Dg Knee 2 Views Right  Result Date: 07/08/2016 CLINICAL DATA:  Chest pain. EXAM: RIGHT KNEE  - 1-2 VIEW COMPARISON:  None. FINDINGS: The patient is status post a below-the-knee amputation. No soft tissue gas is identified. No fractures or dislocations. The distal stump of the fibula has changed in configuration since the March 15, 2015 study. IMPRESSION: The patient is status post amputation. No soft tissue gas identified. The distal stump of the remaining fibula has changed in configuration since the immediate post amputation images March 15, 2015. There is mild irregularity and bony osteolysis in this region. Osteomyelitis not excluded. Electronically Signed   By: David  Williams III M.D   On: 07/08/2016 16:24   - pertinent xrays, CT, MRI studies were reviewed and independently interpreted  Positive ROS: All other systems have been reviewed   and were otherwise negative with the exception of those mentioned in the HPI and as above.  Physical Exam: General: Alert, no acute distress Psychiatric: Patient is competent for consent with normal mood and affect Lymphatic: No axillary or cervical lymphadenopathy Cardiovascular: No pedal edema Respiratory: No cyanosis, no use of accessory musculature GI: No organomegaly, abdomen is soft and non-tender  Skin: Examination patient has an area approximately 2 cm in diameter of cellulitis over the fibula. There is a small wound that is draining.   Neurologic: Patient does not have protective sensation bilateral lower extremities.   MUSCULOSKELETAL:  Examination patient has tenderness to palpation over the distal fibula. There is cellulitis. The radiographs show destructive bony changes of distal fibula consistent with chronic osteomyelitis.  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  Plan: Plan: We will plan for revision of the transtibial amputation. Discussed with the patient we could proceed today. Patient states that she does  not want to continue nothing by mouth today and would prefer surgery on Wednesday. Plan for surgery on Wednesday.  Thank you for the consult and the opportunity to see Ms. Dickman  Marcus Duda, MD [facility] [phone] 6:48 AM      

## 2016-07-09 NOTE — Progress Notes (Signed)
PHARMACY - PHYSICIAN COMMUNICATION CRITICAL VALUE ALERT - BLOOD CULTURE IDENTIFICATION (BCID)  Results for orders placed or performed during the hospital encounter of 07/08/16  Blood Culture ID Panel (Reflexed) (Collected: 07/08/2016  7:42 PM)  Result Value Ref Range   Enterococcus species NOT DETECTED NOT DETECTED   Listeria monocytogenes NOT DETECTED NOT DETECTED   Staphylococcus species DETECTED (A) NOT DETECTED   Staphylococcus aureus NOT DETECTED NOT DETECTED   Methicillin resistance NOT DETECTED NOT DETECTED   Streptococcus species NOT DETECTED NOT DETECTED   Streptococcus agalactiae NOT DETECTED NOT DETECTED   Streptococcus pneumoniae NOT DETECTED NOT DETECTED   Streptococcus pyogenes NOT DETECTED NOT DETECTED   Acinetobacter baumannii NOT DETECTED NOT DETECTED   Enterobacteriaceae species NOT DETECTED NOT DETECTED   Enterobacter cloacae complex NOT DETECTED NOT DETECTED   Escherichia coli NOT DETECTED NOT DETECTED   Klebsiella oxytoca NOT DETECTED NOT DETECTED   Klebsiella pneumoniae NOT DETECTED NOT DETECTED   Proteus species NOT DETECTED NOT DETECTED   Serratia marcescens NOT DETECTED NOT DETECTED   Haemophilus influenzae NOT DETECTED NOT DETECTED   Neisseria meningitidis NOT DETECTED NOT DETECTED   Pseudomonas aeruginosa NOT DETECTED NOT DETECTED   Candida albicans NOT DETECTED NOT DETECTED   Candida glabrata NOT DETECTED NOT DETECTED   Candida krusei NOT DETECTED NOT DETECTED   Candida parapsilosis NOT DETECTED NOT DETECTED   Candida tropicalis NOT DETECTED NOT DETECTED    Name of physician (or Provider) Contacted: Dr. Isidoro Donningai  Changes to prescribed antibiotics required: None (likely contaminant)  Elwin Sleightowell, Negin Hegg Kay 07/09/2016  4:32 PM

## 2016-07-09 NOTE — Progress Notes (Signed)
[facility]                                                                              Patient Demographics  Monique [name], is a 41 y.o. female, DOB - 10/24/1975, ONG:[id number]  Admit date - 07/08/2016   Admitting Physician Vianne Bulls, MD  Outpatient Primary MD for the patient is Patient, [name]  Outpatient specialists:   LOS - 1  days    Chief Complaint  Patient presents with  . 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 X-ray right BKA stump showed mild irregularity and osteo-myelitis could not be ruled out. Orthopedics consulted.   Assessment & Plan    Principal Problem:  Right BKA Cellulitis ?underlying osteomyelitis  - Patient presented with right BKA stump erythema, tenderness, leukocytosis, x-ray with bony irregularity, cannot exclude osteomyelitis. - ESR CRP normal - Orthopedics consulted, seen by Dr. Sharol Given, recommended revision of transtibial amputation, possibly today however patient did not want to be NPO today and wants surgery on Wednesday. Hence now surgery planned on 5/16.  - Continue to hold off on antibiotics unless patient spikes fevers    Active problems  Polysubstance abuse  -  patient has a history of cocaine, opiate abuse, IVDA  Urine drug screen positive for cocaine - Social work consultation requested    Hyponatremia  - Mild, likely secondary to hypovolemia  - Patient on IV fluids, resolved  Chronic pain  - Pt complains of pain at stump site  - Continue home regimen of Percocet and Flexeril   Depression, anxiety  - Continue Wellbutrin and prn Xanax    Hyperglycemia Patient states that she is not diabetic and wants regular diet Diet changed to  regular, will check hemoglobin A1c  Code Status: Full CODE STATUS DVT Prophylaxis:  Lovenox Family Communication: Discussed in detail with the patient, all imaging results, lab results explained to the patient  Disposition Plan:  Time Spent in minutes  25 minutes  Procedures:    Consultants:   Orthopedics, Dr. Sharol Given   Antimicrobials:   None   Medications  Scheduled Meds: . buPROPion  100 mg Oral BID  . Chlorhexidine Gluconate Cloth  6 each Topical Q0600  . enoxaparin (LOVENOX) injection  40 mg Subcutaneous Q24H  . gabapentin  300 mg Oral TID  . lidocaine  1 patch Transdermal Q24H  . mupirocin ointment  1 application Nasal BID   Continuous Infusions: PRN Meds:.acetaminophen **OR** acetaminophen, ALPRAZolam, cyclobenzaprine, ondansetron **OR** ondansetron (ZOFRAN) IV, oxyCODONE-acetaminophen **AND** oxyCODONE   Antibiotics   Anti-infectives    None        Subjective:   Monique [name] was seen and examined today. Chronic pain in the right BKA stump, 5/10. No nausea or vomiting, does not like carb modified diet. States chest feels sore.  Patient denies dizziness, shortness of breath, abdominal pain, N/V/D/C, new weakness, numbess, tingling.  No acute events overnight.    Objective:   Vitals:   07/08/16 1730 07/08/16 1815 07/08/16 1900 07/08/16 2101  BP: 112/62 (!) 100/57 (!) 100/55 123/63  Pulse: 89 87 78 88  Resp:  20    Temp:    97.7 F (36.5 C)  TempSrc:    Oral  SpO2: 95% 92% 95% 99%    Intake/Output Summary (Last 24 hours) at 07/09/16 1115 Last data filed at 07/09/16 0420  Gross per 24 hour  Intake           876.67 ml  Output              600 ml  Net           276.67 ml     Wt Readings from Last 3 Encounters:  01/18/16 88.5 kg (195 lb)  12/21/15 81.6 kg (180 lb)  11/08/15 88.5 kg (195 lb)     Exam  General: Alert and oriented x 3, NAD  HEENT:  PERRLA, EOMI, Anicteric Sclera, mucous membranes moist.   Neck: Supple, no JVD, no  masses  Cardiovascular: S1 S2 auscultated, no rubs, murmurs or gallops. Regular rate and rhythm.  Respiratory: Clear to auscultation bilaterally, no wheezing, rales or rhonchi  Gastrointestinal: Soft, nontender, nondistended, + bowel sounds  Ext: no cyanosis clubbing or edema, Right BKA with about 2 cm area of erythema and cellulitis and small wound which is draining   Neuro: No new deficits  skin: Right BKA with about 2 cm area of erythema and cellulitis and small wound which is draining   Psych: Normal affect and demeanor, alert and oriented x3    Data Reviewed:  I have personally reviewed following labs and imaging studies  Micro Results Recent Results (from the past 240 hour(s))  MRSA PCR Screening     Status: Abnormal   Collection Time: 07/09/16  4:26 AM  Result Value Ref Range Status   MRSA by PCR POSITIVE (A) NEGATIVE Final    Comment:        The GeneXpert MRSA Assay (FDA approved for NASAL specimens only), is one component of a comprehensive MRSA colonization surveillance program. It is not intended to diagnose MRSA infection nor to guide or monitor treatment for MRSA infections. RESULT CALLED TO, READ BACK BY AND VERIFIED WITH: Adela Ports RN 9:45 07/09/16 ([email or web address])     Radiology Reports Dg Chest 2 View  Result Date: 07/08/2016 CLINICAL DATA:  Chest pain. EXAM: CHEST  2 VIEW COMPARISON:  March 15, 2015 FINDINGS: The heart size and mediastinal contours are within normal limits. Both lungs are clear. The visualized skeletal structures are unremarkable. IMPRESSION: No active cardiopulmonary disease. Electronically Signed   By: Dorise Bullion III M.D   On: 07/08/2016 16:20   Dg Knee 2 Views Right  Result Date: 07/08/2016 CLINICAL DATA:  Chest pain. EXAM: RIGHT KNEE - 1-2 VIEW COMPARISON:  None. FINDINGS: The patient is status post a below-the-knee amputation. No soft tissue gas is identified. No fractures or dislocations. The distal stump of the fibula has changed  in configuration since the March 15, 2015 study. IMPRESSION: The patient is status post amputation. No soft tissue gas identified. The distal stump of the remaining fibula has changed in configuration since the immediate post amputation images March 15, 2015. There is mild irregularity and bony osteolysis in this region. Osteomyelitis not excluded. Electronically Signed   By: Dorise Bullion III M.D   On: 07/08/2016 16:24    Lab Data:  CBC:  Recent Labs Lab 07/08/16 1640 07/08/16 1649 07/09/16 0515  WBC 12.5*  --  6.3  NEUTROABS 8.6*  --   --   HGB 12.7 13.9 11.2*  HCT 40.6 41.0 36.4  MCV 80.1  --  81.6  PLT 335  --  837   Basic Metabolic Panel:  Recent Labs Lab 07/08/16 1640 07/08/16 1649 07/09/16 0515  NA 134* 136 137  K 3.4* 3.5 3.9  CL 101 100* 104  CO2 23  --  25  GLUCOSE 145* 148* 116*  BUN 17 21* 23*  CREATININE 0.85 0.70 0.85  CALCIUM 9.1  --  8.3*  MG  --   --  2.1   GFR: CrCl cannot be calculated (Unknown ideal weight.). Liver Function Tests: No results for input(s): AST, ALT, ALKPHOS, BILITOT, PROT, ALBUMIN in the last 168 hours. No results for input(s): LIPASE, AMYLASE in the last 168 hours. No results for input(s): AMMONIA in the last 168 hours. Coagulation Profile: No results for input(s): INR, PROTIME in the last 168 hours. Cardiac Enzymes: No results for input(s): CKTOTAL, CKMB, CKMBINDEX, TROPONINI in the last 168 hours. BNP (last 3 results) No results for input(s): PROBNP in the last 8760 hours. HbA1C: No results for input(s): HGBA1C in the last 72 hours. CBG: No results for input(s): GLUCAP in the last 168 hours. Lipid Profile: No results for input(s): CHOL, HDL, LDLCALC, TRIG, CHOLHDL, LDLDIRECT in the last 72 hours. Thyroid Function Tests: No results for input(s): TSH, T4TOTAL, FREET4, T3FREE, THYROIDAB in the last 72 hours. Anemia Panel: No results for input(s): VITAMINB12, FOLATE, FERRITIN, TIBC, IRON, RETICCTPCT in the last 72  hours. Urine analysis:    Component Value Date/Time   COLORURINE YELLOW 07/09/2016 0425   APPEARANCEUR CLOUDY (A) 07/09/2016 0425   LABSPEC 1.027 07/09/2016 0425   PHURINE 5.0 07/09/2016 0425   GLUCOSEU NEGATIVE 07/09/2016 0425   HGBUR NEGATIVE 07/09/2016 0425   BILIRUBINUR NEGATIVE 07/09/2016 0425   KETONESUR NEGATIVE 07/09/2016 0425   PROTEINUR 100 (A) 07/09/2016 0425   UROBILINOGEN 1.0 08/29/2014 1445   NITRITE NEGATIVE 07/09/2016 0425   LEUKOCYTESUR TRACE (A) 07/09/2016 0425     Ripudeep Rai M.D. [facility] 07/09/2016, 11:15 AM  Pager: [phone] Between 7am to 7pm - call Pager - [phone]  After 7pm go to [email or web address] - password [email or web address]  Call night coverage person covering after 7pm

## 2016-07-10 LAB — BASIC METABOLIC PANEL
ANION GAP: 5 (ref 5–15)
BUN: 14 mg/dL (ref 6–20)
CALCIUM: 8.8 mg/dL — AB (ref 8.9–10.3)
CO2: 26 mmol/L (ref 22–32)
Chloride: 105 mmol/L (ref 101–111)
Creatinine, Ser: 0.79 mg/dL (ref 0.44–1.00)
GLUCOSE: 157 mg/dL — AB (ref 65–99)
POTASSIUM: 4.3 mmol/L (ref 3.5–5.1)
SODIUM: 136 mmol/L (ref 135–145)

## 2016-07-10 LAB — CULTURE, BLOOD (ROUTINE X 2): Special Requests: ADEQUATE

## 2016-07-10 LAB — CBC
HEMATOCRIT: 36.5 % (ref 36.0–46.0)
HEMOGLOBIN: 10.6 g/dL — AB (ref 12.0–15.0)
MCH: 24.1 pg — ABNORMAL LOW (ref 26.0–34.0)
MCHC: 29 g/dL — AB (ref 30.0–36.0)
MCV: 83.1 fL (ref 78.0–100.0)
Platelets: 280 10*3/uL (ref 150–400)
RBC: 4.39 MIL/uL (ref 3.87–5.11)
RDW: 16.9 % — ABNORMAL HIGH (ref 11.5–15.5)
WBC: 5.6 10*3/uL (ref 4.0–10.5)

## 2016-07-10 LAB — HEMOGLOBIN A1C
Hgb A1c MFr Bld: 5.5 % (ref 4.8–5.6)
Mean Plasma Glucose: 111 mg/dL

## 2016-07-10 MED ORDER — CALCIUM CARBONATE ANTACID 500 MG PO CHEW
1.0000 | CHEWABLE_TABLET | Freq: Three times a day (TID) | ORAL | Status: DC | PRN
  Administered 2016-07-10 – 2016-07-11 (×3): 200 mg via ORAL
  Filled 2016-07-10 (×3): qty 1

## 2016-07-10 MED ORDER — ALUM & MAG HYDROXIDE-SIMETH 200-200-20 MG/5ML PO SUSP
15.0000 mL | Freq: Four times a day (QID) | ORAL | Status: DC | PRN
  Administered 2016-07-10: 15 mL via ORAL
  Filled 2016-07-10: qty 30

## 2016-07-10 MED ORDER — CEFAZOLIN SODIUM-DEXTROSE 2-4 GM/100ML-% IV SOLN
2.0000 g | INTRAVENOUS | Status: AC
  Administered 2016-07-11: 2 g via INTRAVENOUS
  Filled 2016-07-10 (×2): qty 100

## 2016-07-10 MED ORDER — MORPHINE SULFATE (PF) 4 MG/ML IV SOLN
1.0000 mg | INTRAVENOUS | Status: DC | PRN
  Administered 2016-07-10 – 2016-07-11 (×8): 1 mg via INTRAVENOUS
  Filled 2016-07-10 (×8): qty 1

## 2016-07-10 MED ORDER — CHLORHEXIDINE GLUCONATE 4 % EX LIQD
60.0000 mL | Freq: Once | CUTANEOUS | Status: AC
  Administered 2016-07-11: 4 via TOPICAL
  Filled 2016-07-10: qty 60

## 2016-07-10 MED ORDER — PANTOPRAZOLE SODIUM 40 MG PO TBEC
40.0000 mg | DELAYED_RELEASE_TABLET | Freq: Every day | ORAL | Status: DC
  Administered 2016-07-10 – 2016-07-13 (×3): 40 mg via ORAL
  Filled 2016-07-10 (×4): qty 1

## 2016-07-10 NOTE — Progress Notes (Signed)
[facility]                                                                              Patient Demographics  Monique [name], is a 40 y.o. female, DOB - [birth year], XAJ:[id number]  Admit date - 07/08/2016   Admitting Physician Vianne Bulls, MD  Outpatient Primary MD for the patient is Patient, [name]  Outpatient specialists:   LOS - 2  days    Chief Complaint  Patient presents with  . 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 X-ray right BKA stump showed mild irregularity and osteo-myelitis could not be ruled out. Orthopedics consulted.   Assessment & Plan    Principal Problem:  Right BKA Cellulitis ?underlying osteomyelitis  - Patient presented with right BKA stump erythema, tenderness, leukocytosis, x-ray with bony irregularity, cannot exclude osteomyelitis. - ESR CRP normal - Orthopedics consulted, seen by Dr. Sharol Given, recommended revision of transtibial amputation, Surgery planned on 5/16, NPO after MN  - Continue to hold off on antibiotics unless patient spikes fevers    Active problems  Polysubstance abuse  -  patient has a history of cocaine, opiate abuse, IVDA  Urine drug screen positive for cocaine - Social work consultation requested    Hyponatremia  - Mild, likely secondary to hypovolemia  - Patient on IV fluids, resolved  Chronic pain  - Pt complains of pain at stump site  - Continue home regimen of Percocet and Flexeril   Depression, anxiety  - Continue Wellbutrin and prn Xanax    Hyperglycemia Patient states that she is not diabetic and wants regular diet Diet changed to regular, hemoglobin A1c 5.5  GERD - Continue Tums as needed , added Protonix   Code  Status: Full CODE STATUS DVT Prophylaxis:  Lovenox Family Communication: Discussed in detail with the patient, all imaging results, lab results explained to the patient  Disposition Plan:  Time Spent in minutes  25 minutes  Procedures:    Consultants:   Orthopedics, Dr. Sharol Given   Antimicrobials:   None   Medications  Scheduled Meds: . buPROPion  100 mg Oral BID  . Chlorhexidine Gluconate Cloth  6 each Topical Q0600  . enoxaparin (LOVENOX) injection  40 mg Subcutaneous Q24H  . gabapentin  300 mg Oral TID  . lidocaine  1 patch Transdermal Q24H  . mupirocin ointment  1 application Nasal BID  . pantoprazole  40 mg Oral Q0600   Continuous Infusions: PRN Meds:.acetaminophen **OR** acetaminophen, ALPRAZolam, calcium carbonate, cyclobenzaprine, morphine injection, ondansetron **OR** ondansetron (ZOFRAN) IV, oxyCODONE-acetaminophen **AND** oxyCODONE   Antibiotics   Anti-infectives    None        Subjective:   Monique [name] was seen and examined today. Pain in the right BKA stump, 4/10. Today complaining of GERD. No nausea or vomiting.  Patient denies dizziness, shortness of breath, abdominal pain, N/V/D/C, new weakness, numbess, tingling.  No acute events overnight.    Objective:   Vitals:   07/08/16 2101 07/09/16 1527 07/09/16 2040 07/10/16 0615  BP: 123/63 111/69 (!) 106/54 (!) 96/50  Pulse: 88 74 68 (!) 55  Resp:   16 15  Temp: 97.7 F (36.5 C) 97.8 F (36.6 C) 97.6 F (36.4 C) 97.7 F (36.5 C)  TempSrc: Oral Oral Oral Axillary  SpO2: 99% 100% 97% 96%    Intake/Output Summary (Last 24 hours) at 07/10/16 1321 Last data filed at 07/10/16 0947  Gross per 24 hour  Intake              720 ml  Output              550 ml  Net              170 ml     Wt Readings from Last 3 Encounters:  01/18/16 88.5 kg (195 lb)  12/21/15 81.6 kg (180 lb)  11/08/15 88.5 kg (195 lb)     Exam  General:Alert and oriented 3 NAD   HEENT: PERRLA, EOMI   Neck:Supple,  no JVD   Cardiovascular:S1 and S2 present, RRR, no MRG   Respiratory:CTA B   Gastrointestinal: Soft,NT, ND, NBS   Ext: no c/c/e, Right BKA with about 2 cm area of erythema and cellulitis and small wound which is draining   Neuro: No new deficits  skin: Right BKA with about 2 cm area of erythema and cellulitis and small wound which is draining   Psych:alert and oriented 3, normal affect  Data Reviewed:  I have personally reviewed following labs and imaging studies  Micro Results Recent Results (from the past 240 hour(s))  Blood culture (routine x 2)     Status: None (Preliminary result)   Collection Time: 07/08/16  7:37 PM  Result Value Ref Range Status   Specimen Description BLOOD LEFT ARM  Final   Special Requests   Final    BOTTLES DRAWN AEROBIC AND ANAEROBIC Blood Culture adequate volume   Culture NO GROWTH 2 DAYS  Final   Report Status PENDING  Incomplete  Blood culture (routine x 2)     Status: Abnormal   Collection Time: 07/08/16  7:42 PM  Result Value Ref Range Status   Specimen Description BLOOD LEFT FOREARM  Final   Special Requests IN PEDIATRIC BOTTLE Blood Culture adequate volume  Final   Culture  Setup Time   Final    GRAM POSITIVE COCCI IN CLUSTERS IN PEDIATRIC BOTTLE Organism ID to follow CRITICAL RESULT CALLED TO, READ BACK BY AND VERIFIED WITH: L. POWELL, PHARMD, 07/09/16 AT 1626 BY J FUDESCO    Culture (A)  Final    STAPHYLOCOCCUS SPECIES (COAGULASE NEGATIVE) THE SIGNIFICANCE OF ISOLATING THIS ORGANISM FROM A SINGLE SET OF BLOOD CULTURES WHEN MULTIPLE SETS ARE DRAWN IS UNCERTAIN. PLEASE NOTIFY THE MICROBIOLOGY DEPARTMENT WITHIN ONE WEEK IF SPECIATION AND SENSITIVITIES ARE REQUIRED.    Report Status 07/10/2016 FINAL  Final  Blood Culture ID Panel (Reflexed)     Status: Abnormal   Collection Time: 07/08/16  7:42 PM  Result Value Ref Range Status   Enterococcus species NOT DETECTED NOT DETECTED Final   Listeria monocytogenes NOT DETECTED NOT DETECTED  Final   Staphylococcus species DETECTED (A) NOT DETECTED Final    Comment: Methicillin (oxacillin) susceptible coagulase negative staphylococcus. Possible blood culture contaminant (unless isolated from more than one blood culture draw or clinical case suggests pathogenicity). No antibiotic treatment is indicated for  blood  culture contaminants. CRITICAL RESULT CALLED TO, READ BACK BY AND VERIFIED WITH: L.POWELL, PHARMD, 07/09/16 AT 1626 BY J FUDESCO    Staphylococcus aureus NOT DETECTED NOT DETECTED Final   Methicillin resistance NOT DETECTED NOT DETECTED Final   Streptococcus species NOT DETECTED NOT DETECTED Final   Streptococcus agalactiae NOT DETECTED NOT DETECTED Final   Streptococcus pneumoniae NOT DETECTED NOT DETECTED Final   Streptococcus pyogenes NOT DETECTED NOT DETECTED Final   Acinetobacter baumannii NOT DETECTED NOT DETECTED Final   Enterobacteriaceae species NOT DETECTED NOT DETECTED Final   Enterobacter cloacae complex NOT DETECTED NOT DETECTED Final   Escherichia coli NOT DETECTED NOT DETECTED Final   Klebsiella oxytoca NOT DETECTED NOT DETECTED Final   Klebsiella pneumoniae NOT DETECTED NOT DETECTED Final   Proteus species NOT DETECTED NOT DETECTED Final   Serratia marcescens NOT DETECTED NOT DETECTED Final   Haemophilus influenzae NOT DETECTED NOT DETECTED Final   Neisseria meningitidis NOT DETECTED NOT DETECTED Final   Pseudomonas aeruginosa NOT DETECTED NOT DETECTED Final   Candida albicans NOT DETECTED NOT DETECTED Final   Candida glabrata NOT DETECTED NOT DETECTED Final   Candida krusei NOT DETECTED NOT DETECTED Final   Candida parapsilosis NOT DETECTED NOT DETECTED Final   Candida tropicalis NOT DETECTED NOT DETECTED Final  MRSA PCR Screening     Status: Abnormal   Collection Time: 07/09/16  4:26 AM  Result Value Ref Range Status   MRSA by PCR POSITIVE (A) NEGATIVE Final    Comment:        The GeneXpert MRSA Assay (FDA approved for NASAL specimens only), is  one component of a comprehensive MRSA colonization surveillance program. It is not intended to diagnose MRSA infection nor to guide or monitor treatment for MRSA infections. RESULT CALLED TO, READ BACK BY AND VERIFIED WITH: Adela Ports RN 9:45 07/09/16 ([email or web address])     Radiology Reports Dg Chest 2 View  Result Date: 07/08/2016 CLINICAL DATA:  Chest pain. EXAM: CHEST  2 VIEW COMPARISON:  March 15, 2015 FINDINGS: The heart size and mediastinal contours are within normal limits. Both lungs are clear. The visualized skeletal structures are unremarkable. IMPRESSION: No active cardiopulmonary disease. Electronically Signed   By: Dorise Bullion III M.D   On: 07/08/2016 16:20   Dg Knee 2 Views Right  Result Date: 07/08/2016 CLINICAL DATA:  Chest pain. EXAM: RIGHT KNEE - 1-2 VIEW COMPARISON:  None. FINDINGS: The patient is status post a below-the-knee amputation. No soft tissue gas is identified. No fractures or dislocations. The distal stump of the fibula has changed in configuration since the March 15, 2015 study. IMPRESSION: The patient is status post amputation. No soft tissue gas identified. The distal stump of the remaining fibula has changed in configuration since the immediate post amputation images March 15, 2015. There is mild irregularity and bony osteolysis in this region. Osteomyelitis not excluded. Electronically Signed   By: Dorise Bullion III M.D   On: 07/08/2016 16:24    Lab Data:  CBC:  Recent Labs Lab 07/08/16 1640 07/08/16 1649 07/09/16 0515 07/10/16 0552  WBC 12.5*  --  6.3 5.6  NEUTROABS 8.6*  --   --   --   HGB 12.7 13.9 11.2* 10.6*  HCT 40.6 41.0 36.4 36.5  MCV 80.1  --  81.6 83.1  PLT 335  --  295 655   Basic Metabolic Panel:  Recent Labs Lab 07/08/16 1640 07/08/16 1649 07/09/16 0515 07/10/16 0552  NA 134* 136 137 136  K 3.4* 3.5 3.9 4.3  CL 101 100* 104 105  CO2 23  --  25 26  GLUCOSE 145* 148* 116* 157*  BUN 17 21* 23* 14  CREATININE 0.85 0.70  0.85 0.79  CALCIUM 9.1  --  8.3* 8.8*  MG  --   --  2.1  --    GFR: CrCl cannot be calculated (Unknown ideal weight.). Liver Function Tests: No results for input(s): AST, ALT, ALKPHOS, BILITOT, PROT, ALBUMIN in the last 168 hours. No results for input(s): LIPASE, AMYLASE in the last 168 hours. No results for input(s): AMMONIA in the last 168 hours. Coagulation Profile: No results for input(s): INR, PROTIME in the last 168 hours. Cardiac Enzymes: No results for input(s): CKTOTAL, CKMB, CKMBINDEX, TROPONINI in the last 168 hours. BNP (last 3 results) No results for input(s): PROBNP in the last 8760 hours. HbA1C:  Recent Labs  07/09/16 0830  HGBA1C 5.5   CBG: No results for input(s): GLUCAP in the last 168 hours. Lipid Profile: No results for input(s): CHOL, HDL, LDLCALC, TRIG, CHOLHDL, LDLDIRECT in the last 72 hours. Thyroid Function Tests: No results for input(s): TSH, T4TOTAL, FREET4, T3FREE, THYROIDAB in the last 72 hours. Anemia Panel: No results for input(s): VITAMINB12, FOLATE, FERRITIN, TIBC, IRON, RETICCTPCT in the last 72 hours. Urine analysis:    Component Value Date/Time   COLORURINE YELLOW 07/09/2016 0425   APPEARANCEUR CLOUDY (A) 07/09/2016 0425   LABSPEC 1.027 07/09/2016 0425   PHURINE 5.0 07/09/2016 0425   GLUCOSEU NEGATIVE 07/09/2016 0425   HGBUR NEGATIVE 07/09/2016 0425   BILIRUBINUR NEGATIVE 07/09/2016 0425   KETONESUR NEGATIVE 07/09/2016 0425   PROTEINUR 100 (A) 07/09/2016 0425   UROBILINOGEN 1.0 08/29/2014 1445   NITRITE NEGATIVE 07/09/2016 0425   LEUKOCYTESUR TRACE (A) 07/09/2016 0425     Mainor Hellmann M.D. [facility] 07/10/2016, 1:21 PM  Pager: [phone] Between 7am to 7pm - call Pager - [phone]  After 7pm go to [email or web address] - password [email or web address]  Call night coverage person covering after 7pm

## 2016-07-11 ENCOUNTER — Inpatient Hospital Stay (HOSPITAL_COMMUNITY): Payer: Medicaid Other | Admitting: Certified Registered Nurse Anesthetist

## 2016-07-11 ENCOUNTER — Encounter (HOSPITAL_COMMUNITY)
Admission: EM | Disposition: A | Discharge status: Home / Self Care | Payer: Self-pay | Source: Home / Self Care | Attending: Internal Medicine

## 2016-07-11 ENCOUNTER — Encounter (HOSPITAL_COMMUNITY): Payer: Self-pay | Admitting: Certified Registered Nurse Anesthetist

## 2016-07-11 DIAGNOSIS — Z22322 Carrier or suspected carrier of Methicillin resistant Staphylococcus aureus: Secondary | ICD-10-CM

## 2016-07-11 DIAGNOSIS — Y835 Amputation of limb(s) as the cause of abnormal reaction of the patient, or of later complication, without mention of misadventure at the time of the procedure: Secondary | ICD-10-CM

## 2016-07-11 DIAGNOSIS — L03115 Cellulitis of right lower limb: Secondary | ICD-10-CM

## 2016-07-11 DIAGNOSIS — B957 Other staphylococcus as the cause of diseases classified elsewhere: Secondary | ICD-10-CM

## 2016-07-11 DIAGNOSIS — Z801 Family history of malignant neoplasm of trachea, bronchus and lung: Secondary | ICD-10-CM

## 2016-07-11 DIAGNOSIS — Z9889 Other specified postprocedural states: Secondary | ICD-10-CM

## 2016-07-11 DIAGNOSIS — Z978 Presence of other specified devices: Secondary | ICD-10-CM

## 2016-07-11 DIAGNOSIS — F17210 Nicotine dependence, cigarettes, uncomplicated: Secondary | ICD-10-CM

## 2016-07-11 DIAGNOSIS — T8743 Infection of amputation stump, right lower extremity: Principal | ICD-10-CM

## 2016-07-11 DIAGNOSIS — F1410 Cocaine abuse, uncomplicated: Secondary | ICD-10-CM

## 2016-07-11 HISTORY — PX: STUMP REVISION: SHX6102

## 2016-07-11 LAB — BASIC METABOLIC PANEL
Anion gap: 7 (ref 5–15)
BUN: 12 mg/dL (ref 6–20)
CALCIUM: 8.9 mg/dL (ref 8.9–10.3)
CO2: 28 mmol/L (ref 22–32)
Chloride: 101 mmol/L (ref 101–111)
Creatinine, Ser: 0.69 mg/dL (ref 0.44–1.00)
GFR calc Af Amer: >60 mL/min (ref >60)
GFR calc non Af Amer: >60 mL/min (ref >60)
GLUCOSE: 116 mg/dL — AB (ref 65–99)
Potassium: 4.6 mmol/L (ref 3.5–5.1)
Sodium: 136 mmol/L (ref 135–145)

## 2016-07-11 LAB — CBC
HCT: 35.7 % — ABNORMAL LOW (ref 36.0–46.0)
Hemoglobin: 10.3 g/dL — ABNORMAL LOW (ref 12.0–15.0)
MCH: 24.3 pg — ABNORMAL LOW (ref 26.0–34.0)
MCHC: 28.9 g/dL — AB (ref 30.0–36.0)
MCV: 84.4 fL (ref 78.0–100.0)
PLATELETS: 285 10*3/uL (ref 150–400)
RBC: 4.23 MIL/uL (ref 3.87–5.11)
RDW: 16.8 % — AB (ref 11.5–15.5)
WBC: 4.7 10*3/uL (ref 4.0–10.5)

## 2016-07-11 SURGERY — REVISION, AMPUTATION SITE
Anesthesia: General | Laterality: Right

## 2016-07-11 MED ORDER — ONDANSETRON HCL 4 MG/2ML IJ SOLN: 4.0000 mg | Freq: Four times a day (QID) | INTRAMUSCULAR | Status: DC | PRN

## 2016-07-11 MED ORDER — HYDROMORPHONE HCL 1 MG/ML IJ SOLN
1.0000 mg | INTRAMUSCULAR | Status: DC | PRN
  Administered 2016-07-11 – 2016-07-13 (×16): 1 mg via INTRAVENOUS
  Filled 2016-07-11 (×17): qty 1

## 2016-07-11 MED ORDER — METOCLOPRAMIDE HCL 5 MG PO TABS: 5.0000 mg | ORAL_TABLET | Freq: Three times a day (TID) | ORAL | Status: DC | PRN

## 2016-07-11 MED ORDER — ACETAMINOPHEN 325 MG PO TABS
650.0000 mg | ORAL_TABLET | Freq: Four times a day (QID) | ORAL | Status: DC | PRN
  Administered 2016-07-11: 650 mg via ORAL

## 2016-07-11 MED ORDER — MAGNESIUM CITRATE PO SOLN: 1.0000 | Freq: Once | ORAL | Status: DC | PRN

## 2016-07-11 MED ORDER — DOCUSATE SODIUM 100 MG PO CAPS
100.0000 mg | ORAL_CAPSULE | Freq: Two times a day (BID) | ORAL | Status: DC
  Administered 2016-07-11 – 2016-07-13 (×5): 100 mg via ORAL
  Filled 2016-07-11 (×5): qty 1

## 2016-07-11 MED ORDER — PROPOFOL 10 MG/ML IV BOLUS
INTRAVENOUS | Status: DC | PRN
  Administered 2016-07-11: 180 mg via INTRAVENOUS

## 2016-07-11 MED ORDER — HYDROMORPHONE HCL 1 MG/ML IJ SOLN
INTRAMUSCULAR | Status: AC
  Filled 2016-07-11: qty 0.5

## 2016-07-11 MED ORDER — FENTANYL CITRATE (PF) 100 MCG/2ML IJ SOLN
INTRAMUSCULAR | Status: DC | PRN
  Administered 2016-07-11 (×2): 25 ug via INTRAVENOUS
  Administered 2016-07-11: 50 ug via INTRAVENOUS

## 2016-07-11 MED ORDER — MIDAZOLAM HCL 5 MG/5ML IJ SOLN
INTRAMUSCULAR | Status: DC | PRN
  Administered 2016-07-11: 2 mg via INTRAVENOUS

## 2016-07-11 MED ORDER — METHOCARBAMOL 500 MG PO TABS
500.0000 mg | ORAL_TABLET | Freq: Four times a day (QID) | ORAL | Status: DC | PRN
  Administered 2016-07-11 – 2016-07-12 (×2): 500 mg via ORAL
  Filled 2016-07-11 (×3): qty 1

## 2016-07-11 MED ORDER — CEPHALEXIN 500 MG PO CAPS
500.0000 mg | ORAL_CAPSULE | Freq: Four times a day (QID) | ORAL | Status: DC
  Administered 2016-07-11 – 2016-07-13 (×8): 500 mg via ORAL
  Filled 2016-07-11 (×9): qty 1

## 2016-07-11 MED ORDER — OXYCODONE HCL 5 MG/5ML PO SOLN: 5.0000 mg | Freq: Once | ORAL | Status: DC | PRN

## 2016-07-11 MED ORDER — OXYCODONE HCL 5 MG PO TABS: 5.0000 mg | ORAL_TABLET | Freq: Once | ORAL | Status: DC | PRN

## 2016-07-11 MED ORDER — MIDAZOLAM HCL 2 MG/2ML IJ SOLN
INTRAMUSCULAR | Status: AC
  Filled 2016-07-11: qty 2

## 2016-07-11 MED ORDER — LACTATED RINGERS IV SOLN
INTRAVENOUS | Status: DC
  Administered 2016-07-11: 10:00:00 via INTRAVENOUS

## 2016-07-11 MED ORDER — POLYETHYLENE GLYCOL 3350 17 G PO PACK: 17.0000 g | PACK | Freq: Every day | ORAL | Status: DC | PRN

## 2016-07-11 MED ORDER — METOCLOPRAMIDE HCL 5 MG/ML IJ SOLN: 5.0000 mg | Freq: Three times a day (TID) | INTRAMUSCULAR | Status: DC | PRN

## 2016-07-11 MED ORDER — SODIUM CHLORIDE 0.9 % IV SOLN
INTRAVENOUS | Status: DC
  Administered 2016-07-11: 14:00:00 via INTRAVENOUS

## 2016-07-11 MED ORDER — BISACODYL 10 MG RE SUPP: 10.0000 mg | Freq: Every day | RECTAL | Status: DC | PRN

## 2016-07-11 MED ORDER — 0.9 % SODIUM CHLORIDE (POUR BTL) OPTIME
TOPICAL | Status: DC | PRN
  Administered 2016-07-11: 1000 mL

## 2016-07-11 MED ORDER — ONDANSETRON HCL 4 MG/2ML IJ SOLN
INTRAMUSCULAR | Status: DC | PRN
  Administered 2016-07-11: 4 mg via INTRAVENOUS

## 2016-07-11 MED ORDER — FENTANYL CITRATE (PF) 250 MCG/5ML IJ SOLN
INTRAMUSCULAR | Status: AC
  Filled 2016-07-11: qty 5

## 2016-07-11 MED ORDER — HYDROMORPHONE HCL 1 MG/ML IJ SOLN
0.2500 mg | INTRAMUSCULAR | Status: DC | PRN
  Administered 2016-07-11: 0.5 mg via INTRAVENOUS

## 2016-07-11 MED ORDER — ONDANSETRON HCL 4 MG PO TABS: 4.0000 mg | ORAL_TABLET | Freq: Four times a day (QID) | ORAL | Status: DC | PRN

## 2016-07-11 MED ORDER — ACETAMINOPHEN 650 MG RE SUPP: 650.0000 mg | Freq: Four times a day (QID) | RECTAL | Status: DC | PRN

## 2016-07-11 MED ORDER — METHOCARBAMOL 1000 MG/10ML IJ SOLN
500.0000 mg | Freq: Four times a day (QID) | INTRAVENOUS | Status: DC | PRN
  Administered 2016-07-11: 500 mg via INTRAVENOUS
  Filled 2016-07-11 (×2): qty 5

## 2016-07-11 MED ORDER — PROPOFOL 10 MG/ML IV BOLUS
INTRAVENOUS | Status: AC
  Filled 2016-07-11: qty 20

## 2016-07-11 MED ORDER — OXYCODONE HCL 5 MG PO TABS
5.0000 mg | ORAL_TABLET | ORAL | Status: DC | PRN
  Administered 2016-07-11 – 2016-07-13 (×11): 10 mg via ORAL
  Administered 2016-07-13: 5 mg via ORAL
  Filled 2016-07-11 (×11): qty 2

## 2016-07-11 SURGICAL SUPPLY — 34 items
BLADE SAW RECIP 87.9 MT (BLADE) ×2 IMPLANT
BLADE SURG 21 STRL SS (BLADE) ×3 IMPLANT
CANISTER WOUND CARE 500ML ATS (WOUND CARE) ×2 IMPLANT
COVER SURGICAL LIGHT HANDLE (MISCELLANEOUS) ×3 IMPLANT
DRAPE EXTREMITY T 121X128X90 (DRAPE) ×3 IMPLANT
DRAPE HALF SHEET 40X57 (DRAPES) ×3 IMPLANT
DRAPE INCISE IOBAN 66X45 STRL (DRAPES) ×5 IMPLANT
DRAPE U-SHAPE 47X51 STRL (DRAPES) ×6 IMPLANT
DRSG VAC ATS MED SENSATRAC (GAUZE/BANDAGES/DRESSINGS) ×1 IMPLANT
DURAPREP 26ML APPLICATOR (WOUND CARE) ×3 IMPLANT
ELECT REM PT RETURN 9FT ADLT (ELECTROSURGICAL) ×3
ELECTRODE REM PT RTRN 9FT ADLT (ELECTROSURGICAL) ×1 IMPLANT
GLOVE BIOGEL PI IND STRL 6.5 (GLOVE) IMPLANT
GLOVE BIOGEL PI IND STRL 8.5 (GLOVE) IMPLANT
GLOVE BIOGEL PI IND STRL 9 (GLOVE) ×1 IMPLANT
GLOVE BIOGEL PI INDICATOR 6.5 (GLOVE) ×2
GLOVE BIOGEL PI INDICATOR 8.5 (GLOVE) ×2
GLOVE BIOGEL PI INDICATOR 9 (GLOVE) ×2
GLOVE SURG ORTHO 9.0 STRL STRW (GLOVE) ×3 IMPLANT
GLOVE SURG SS PI 6.5 STRL IVOR (GLOVE) ×4 IMPLANT
GOWN STRL REUS W/ TWL XL LVL3 (GOWN DISPOSABLE) ×2 IMPLANT
GOWN STRL REUS W/TWL XL LVL3 (GOWN DISPOSABLE) ×6
KIT BASIN OR (CUSTOM PROCEDURE TRAY) ×3 IMPLANT
KIT ROOM TURNOVER OR (KITS) ×3 IMPLANT
MANIFOLD NEPTUNE II (INSTRUMENTS) ×1 IMPLANT
NS IRRIG 1000ML POUR BTL (IV SOLUTION) ×3 IMPLANT
PACK GENERAL/GYN (CUSTOM PROCEDURE TRAY) ×3 IMPLANT
PAD ARMBOARD 7.5X6 YLW CONV (MISCELLANEOUS) ×3 IMPLANT
PAD NEG PRESSURE SENSATRAC (MISCELLANEOUS) IMPLANT
STAPLER VISISTAT 35W (STAPLE) IMPLANT
SUT ETHILON 2 0 PSLX (SUTURE) ×6 IMPLANT
SUT SILK 2 0 (SUTURE)
SUT SILK 2-0 18XBRD TIE 12 (SUTURE) IMPLANT
TOWEL OR 17X26 10 PK STRL BLUE (TOWEL DISPOSABLE) ×3 IMPLANT

## 2016-07-11 NOTE — Progress Notes (Signed)
PROGRESS NOTE    Monique [name]  NOM:[id number] DOB: 01-20-1976 DOA: 07/08/2016 PCP: Patient, [name]    Brief Narrative:  The patient is a 41 year old female with anxiety, depression, polysubstance abuse, cocaine abuse, chronic pain, history of osteoarthritis involving the right lower leg status post BKA presented with pain, swelling, redness involving the BKA stump, for at least a month. X-ray right BKA stump showed mild irregularity and osteo-myelitis could not be ruled out. Orthopedics consulted.   Assessment & Plan:   Principal Problem:   BKA stump complication ([place]) Active Problems:   Drug abuse   Hypokalemia   Hyponatremia   History of osteomyelitis   Depression with anxiety  Right BKA Cellulitis ?underlying osteomyelitis  - Patient presented with right BKA stump erythema, tenderness, leukocytosis, x-ray with bony irregularity, cannot exclude osteomyelitis. - ESR CRP normal - Orthopedics consulted, seen by Dr. Sharol Given, recommended revision of transtibial amputation, which was done today, 5/16. - Continue to hold off on antibiotics unless patient spikes fevers  - ID consultation for antibiotic recommendations and duration.   Active problems  Polysubstance abuse  -  patient has a history of cocaine, opiate abuse, IVDA  Urine drug screen positive for cocaine - Social work consultation requested   Hyponatremia  - Mild, likely secondary to hypovolemia  - Resolved with hydration.   Chronic pain  - Pt complains of pain at stump site  - Continue current regimen of Percocet and Flexeril, IV dilaudid prn. Per orthopedics.  Depression, anxiety  - Continue Wellbutrin and prn Xanax   Hyperglycemia Hemoglobin A1c 5.5. Diet has been changed patient currently on a regular diet.   GERD - Continue PPI. Tums.    DVT prophylaxis: Lovenox Code Status: Full Family Communication: Updated patient. No family at bedside. Disposition Plan: Likely home versus SNF  per orthopedics.   Consultants:   Orthopedics: Dr Sharol Given 07/09/2016  ID pending  Procedures:   Chest x-ray 07/08/2016  Plain films right knee 07/08/2016 Revision Right Below Knee Amputation, Fibula Application of wound VAC.--Dr Sharol Given 07/11/2016  Antimicrobials:   Keflex 07/11/16     Subjective: Patient complaining of right stump pain. Patient denies any chest pain. No shortness of breath.  Objective: Vitals:   07/11/16 1115 07/11/16 1130 07/11/16 1145 07/11/16 1204  BP: 117/63 118/69 (!) 96/49 121/68  Pulse: 72 71 69 78  Resp: '12 13 13 15  ' Temp:   97.5 F (36.4 C) 98.1 F (36.7 C)  TempSrc:    Oral  SpO2: 97% 100% 100% 99%  Weight:      Height:        Intake/Output Summary (Last 24 hours) at 07/11/16 1328 Last data filed at 07/11/16 1106  Gross per 24 hour  Intake              600 ml  Output               50 ml  Net              550 ml   Filed Weights   07/11/16 0949  Weight: 88.5 kg (195 lb)    Examination:  General exam: Appears calm and comfortable  Respiratory system: Clear to auscultation. Respiratory effort normal. Cardiovascular system: S1 & S2 heard, RRR. No JVD, murmurs, rubs, gallops or clicks. No pedal edema. Gastrointestinal system: Abdomen is nondistended, soft and nontender. No organomegaly or masses felt. Normal bowel sounds heard. Central nervous system: Alert and oriented. No focal neurological deficits. Extremities: Right  lower extremity status post right BKA with wound VAC. Blister noted proximal to right knee. Skin: No rashes, lesions or ulcers Psychiatry: Judgement and insight appear normal. Mood & affect appropriate.     Data Reviewed: I have personally reviewed following labs and imaging studies  CBC:  Recent Labs Lab 07/08/16 1640 07/08/16 1649 07/09/16 0515 07/10/16 0552 07/11/16 0528  WBC 12.5*  --  6.3 5.6 4.7  NEUTROABS 8.6*  --   --   --   --   HGB 12.7 13.9 11.2* 10.6* 10.3*  HCT 40.6 41.0 36.4 36.5 35.7*  MCV  80.1  --  81.6 83.1 84.4  PLT 335  --  295 280 751   Basic Metabolic Panel:  Recent Labs Lab 07/08/16 1640 07/08/16 1649 07/09/16 0515 07/10/16 0552 07/11/16 0528  NA 134* 136 137 136 136  K 3.4* 3.5 3.9 4.3 4.6  CL 101 100* 104 105 101  CO2 23  --  '25 26 28  ' GLUCOSE 145* 148* 116* 157* 116*  BUN 17 21* 23* 14 12  CREATININE 0.85 0.70 0.85 0.79 0.69  CALCIUM 9.1  --  8.3* 8.8* 8.9  MG  --   --  2.1  --   --    GFR: Estimated Creatinine Clearance: 100.6 mL/min (by C-G formula based on SCr of 0.69 mg/dL). Liver Function Tests: No results for input(s): AST, ALT, ALKPHOS, BILITOT, PROT, ALBUMIN in the last 168 hours. No results for input(s): LIPASE, AMYLASE in the last 168 hours. No results for input(s): AMMONIA in the last 168 hours. Coagulation Profile: No results for input(s): INR, PROTIME in the last 168 hours. Cardiac Enzymes: No results for input(s): CKTOTAL, CKMB, CKMBINDEX, TROPONINI in the last 168 hours. BNP (last 3 results) No results for input(s): PROBNP in the last 8760 hours. HbA1C:  Recent Labs  07/09/16 0830  HGBA1C 5.5   CBG: No results for input(s): GLUCAP in the last 168 hours. Lipid Profile: No results for input(s): CHOL, HDL, LDLCALC, TRIG, CHOLHDL, LDLDIRECT in the last 72 hours. Thyroid Function Tests: No results for input(s): TSH, T4TOTAL, FREET4, T3FREE, THYROIDAB in the last 72 hours. Anemia Panel: No results for input(s): VITAMINB12, FOLATE, FERRITIN, TIBC, IRON, RETICCTPCT in the last 72 hours. Sepsis Labs: No results for input(s): PROCALCITON, LATICACIDVEN in the last 168 hours.  Recent Results (from the past 240 hour(s))  Blood culture (routine x 2)     Status: None (Preliminary result)   Collection Time: 07/08/16  7:37 PM  Result Value Ref Range Status   Specimen Description BLOOD LEFT ARM  Final   Special Requests   Final    BOTTLES DRAWN AEROBIC AND ANAEROBIC Blood Culture adequate volume   Culture NO GROWTH 2 DAYS  Final    Report Status PENDING  Incomplete  Blood culture (routine x 2)     Status: Abnormal   Collection Time: 07/08/16  7:42 PM  Result Value Ref Range Status   Specimen Description BLOOD LEFT FOREARM  Final   Special Requests IN PEDIATRIC BOTTLE Blood Culture adequate volume  Final   Culture  Setup Time   Final    GRAM POSITIVE COCCI IN CLUSTERS IN PEDIATRIC BOTTLE Organism ID to follow CRITICAL RESULT CALLED TO, READ BACK BY AND VERIFIED WITH: L. POWELL, PHARMD, 07/09/16 AT 1626 BY J FUDESCO    Culture (A)  Final    STAPHYLOCOCCUS SPECIES (COAGULASE NEGATIVE) THE SIGNIFICANCE OF ISOLATING THIS ORGANISM FROM A SINGLE SET OF BLOOD CULTURES WHEN MULTIPLE SETS  ARE DRAWN IS UNCERTAIN. PLEASE NOTIFY THE MICROBIOLOGY DEPARTMENT WITHIN ONE WEEK IF SPECIATION AND SENSITIVITIES ARE REQUIRED.    Report Status 07/10/2016 FINAL  Final  Blood Culture ID Panel (Reflexed)     Status: Abnormal   Collection Time: 07/08/16  7:42 PM  Result Value Ref Range Status   Enterococcus species NOT DETECTED NOT DETECTED Final   Listeria monocytogenes NOT DETECTED NOT DETECTED Final   Staphylococcus species DETECTED (A) NOT DETECTED Final    Comment: Methicillin (oxacillin) susceptible coagulase negative staphylococcus. Possible blood culture contaminant (unless isolated from more than one blood culture draw or clinical case suggests pathogenicity). No antibiotic treatment is indicated for blood  culture contaminants. CRITICAL RESULT CALLED TO, READ BACK BY AND VERIFIED WITH: L.POWELL, PHARMD, 07/09/16 AT 1626 BY J FUDESCO    Staphylococcus aureus NOT DETECTED NOT DETECTED Final   Methicillin resistance NOT DETECTED NOT DETECTED Final   Streptococcus species NOT DETECTED NOT DETECTED Final   Streptococcus agalactiae NOT DETECTED NOT DETECTED Final   Streptococcus pneumoniae NOT DETECTED NOT DETECTED Final   Streptococcus pyogenes NOT DETECTED NOT DETECTED Final   Acinetobacter baumannii NOT DETECTED NOT DETECTED Final     Enterobacteriaceae species NOT DETECTED NOT DETECTED Final   Enterobacter cloacae complex NOT DETECTED NOT DETECTED Final   Escherichia coli NOT DETECTED NOT DETECTED Final   Klebsiella oxytoca NOT DETECTED NOT DETECTED Final   Klebsiella pneumoniae NOT DETECTED NOT DETECTED Final   Proteus species NOT DETECTED NOT DETECTED Final   Serratia marcescens NOT DETECTED NOT DETECTED Final   Haemophilus influenzae NOT DETECTED NOT DETECTED Final   Neisseria meningitidis NOT DETECTED NOT DETECTED Final   Pseudomonas aeruginosa NOT DETECTED NOT DETECTED Final   Candida albicans NOT DETECTED NOT DETECTED Final   Candida glabrata NOT DETECTED NOT DETECTED Final   Candida krusei NOT DETECTED NOT DETECTED Final   Candida parapsilosis NOT DETECTED NOT DETECTED Final   Candida tropicalis NOT DETECTED NOT DETECTED Final  MRSA PCR Screening     Status: Abnormal   Collection Time: 07/09/16  4:26 AM  Result Value Ref Range Status   MRSA by PCR POSITIVE (A) NEGATIVE Final    Comment:        The GeneXpert MRSA Assay (FDA approved for NASAL specimens only), is one component of a comprehensive MRSA colonization surveillance program. It is not intended to diagnose MRSA infection nor to guide or monitor treatment for MRSA infections. RESULT CALLED TO, READ BACK BY AND VERIFIED WITH: Adela Ports RN 9:45 07/09/16 ([email or web address])          Radiology Studies: No results found.      Scheduled Meds: . buPROPion  100 mg Oral BID  . Chlorhexidine Gluconate Cloth  6 each Topical Q0600  . docusate sodium  100 mg Oral BID  . enoxaparin (LOVENOX) injection  40 mg Subcutaneous Q24H  . gabapentin  300 mg Oral TID  . lidocaine  1 patch Transdermal Q24H  . mupirocin ointment  1 application Nasal BID  . pantoprazole  40 mg Oral Q0600   Continuous Infusions: . sodium chloride    . lactated ringers 10 mL/hr at 07/11/16 0952  . methocarbamol (ROBAXIN)  IV Stopped (07/11/16 1154)     LOS: 3 days    Time  spent: 80 minutes    Carnie Bruemmer, MD [facility] Pager [phone]  If 7PM-7AM, please contact night-coverage [email or web address] Password [email or web address] 07/11/2016, 1:28 PM

## 2016-07-11 NOTE — Progress Notes (Signed)
Pt woke up from sleep and called for pain meds. RN explained to pt that multiple PRNs are due at the same time. RN asked pt which pain med she would like for now. Pt stated that she wants all of her PRNs that are due now. RN educated pt about taking pain medications all at one time. Pt was upset with nurse and asked for charge nurse. Charge nurse and AD at the bedside.

## 2016-07-11 NOTE — Op Note (Signed)
07/08/2016 - 07/11/2016  10:42 AM  PATIENT:  Monique [name]    PRE-OPERATIVE DIAGNOSIS:  Osteomeyelitis Right Below Knee Amputation  POST-OPERATIVE DIAGNOSIS:  Same  PROCEDURE:  Revision Right Below Knee Amputation, Fibula Application of wound VAC.  SURGEON:  Nadara MustardMarcus V Anthon Harpole, MD  PHYSICIAN ASSISTANT:None ANESTHESIA:   General  PREOPERATIVE INDICATIONS:  Monique [name] is a  41 y.o. female with a diagnosis of Osteomeyelitis Right Below Knee Amputation who failed conservative measures and elected for surgical management.    The risks benefits and alternatives were discussed with the patient preoperatively including but not limited to the risks of infection, bleeding, nerve injury, cardiopulmonary complications, the need for revision surgery, among others, and the patient was willing to proceed.  OPERATIVE IMPLANTS: Wound VAC  OPERATIVE FINDINGS: No abscess good healthy muscle.  OPERATIVE PROCEDURE: Patient was brought to the operating room and underwent a general anesthetic. After adequate levels anesthesia were obtained patient's right lower extremity was prepped using DuraPrep draped into a sterile field a timeout was called. Elliptical incision was made around the ulcerative tissue over the fibula. The distal 2 cm of the fibula were resected. There was good healthy muscle no abscess no ischemic tissue. The infection seemed to be contained within the fibula. The wound was irrigated with normal saline hemostasis was obtained the incision was closed using 2-0 nylon and a wound VAC was applied. Patient was extubated taken to the PACU in stable condition.

## 2016-07-11 NOTE — Progress Notes (Signed)
Orthopedic Tech Progress Note Patient Details:  Sherron FlemingsKimberly E Granlund 05/27/1975 [id number] Brace completed by bio-tech Patient ID: Sherron FlemingsKimberly E Zaring, female   DOB: 07/14/1975, 41 y.o.   MRN: [id number]   Jennye MoccasinHughes, Talis Iwan Craig 07/11/2016, 4:51 PM

## 2016-07-11 NOTE — Transfer of Care (Signed)
Immediate Anesthesia Transfer of Care Note  Patient: Monique [name]  Procedure(s) Performed: Procedure(s): Revision Right Below Knee Amputation, Fibula (Right)  Patient Location: PACU  Anesthesia Type:General  Level of Consciousness: awake, alert , oriented and patient cooperative  Airway & Oxygen Therapy: Patient Spontanous Breathing and Patient connected to nasal cannula oxygen  Post-op Assessment: Report given to RN and Post -op Vital signs reviewed and stable  Post vital signs: Reviewed and stable  Last Vitals:  Vitals:   07/11/16 0551 07/11/16 0800  BP: 109/61 (!) 130/51  Pulse: (!) 59 68  Resp: 18   Temp: 36.8 C 36.7 C    Last Pain:  Vitals:   07/11/16 0655  TempSrc:   PainSc: Asleep      Patients Stated Pain Goal: 3 (07/10/16 1700)  Complications: No apparent anesthesia complications

## 2016-07-11 NOTE — Anesthesia Preprocedure Evaluation (Signed)
Anesthesia Evaluation  Patient identified by MRN, date of birth, ID band Patient awake    Reviewed: Allergy & Precautions, NPO status , Patient's Chart, lab work & pertinent test results  History of Anesthesia Complications Negative for: history of anesthetic complications  Airway Mallampati: II  TM Distance: >3 FB Neck ROM: Full    Dental  (+) Poor Dentition, Chipped, Missing, Dental Advisory Given   Pulmonary COPD, Current Smoker,    breath sounds clear to auscultation       Cardiovascular negative cardio ROS   Rhythm:Regular Rate:Normal     Neuro/Psych PSYCHIATRIC DISORDERS Anxiety Depression Bipolar Disorder negative neurological ROS     GI/Hepatic GERD  Controlled,(+)     substance abuse  cocaine use and marijuana use,   Endo/Other  Morbid obesity  Renal/GU negative Renal ROS     Musculoskeletal   Abdominal (+) + obese,   Peds  Hematology  (+) Blood dyscrasia (Hb 8.8), anemia ,   Anesthesia Other Findings   Reproductive/Obstetrics BTL                             Anesthesia Physical Anesthesia Plan  ASA: III  Anesthesia Plan: General   Post-op Pain Management:    Induction: Intravenous  Airway Management Planned: LMA  Additional Equipment: None  Intra-op Plan:   Post-operative Plan: Extubation in OR  Informed Consent: I have reviewed the patients History and Physical, chart, labs and discussed the procedure including the risks, benefits and alternatives for the proposed anesthesia with the patient or authorized representative who has indicated his/her understanding and acceptance.   Dental advisory given  Plan Discussed with: CRNA and Surgeon  Anesthesia Plan Comments:         Anesthesia Quick Evaluation

## 2016-07-11 NOTE — H&P (View-Only) (Signed)
ORTHOPAEDIC CONSULTATION  REQUESTING PHYSICIAN: Cathren Harsh, MD  Chief Complaint: Painful draining transtibial amputation on the right.  HPI: Monique [name] is a 41 y.o. female who presents with a area over the distal fibula with cellulitis which occasionally drains and is painful  Past Medical History:  Diagnosis Date  . Anemia   . Anxiety   . Arthritis   . Asthma    PHM  . Bipolar disorder (HCC)   . Chronic back pain    "middle and lower" (12/17/2014)  . Depression   . GERD (gastroesophageal reflux disease)   . H/O blood clots   . Infection right ankle  . Migraine    "weekly" (12/17/2014)  . MRSA infection   . Nerve damage of right foot   . Osteomyelitis of ankle (HCC)    right   Past Surgical History:  Procedure Laterality Date  . AMPUTATION Right 03/04/2015   Procedure: Right Below Knee Amputation;  Surgeon: [name], MD;  Location: [facility] OR;  Service: Orthopedics;  Laterality: Right;  . ANKLE FRACTURE SURGERY Right 82016  . ANKLE FUSION Right 01/19/2015   Procedure: Right Tibiotalar Fusion, Place Antibiotic Beads;  Surgeon: [name], MD;  Location: [facility] OR;  Service: Orthopedics;  Laterality: Right;  . DILATION AND CURETTAGE OF UTERUS  X 2  . EXTERNAL FIXATION LEG Right 01/19/2015   Procedure: EXTERNAL FIXATION Right Ankle;  Surgeon: [name], MD;  Location: [facility] OR;  Service: Orthopedics;  Laterality: Right;  . EXTERNAL FIXATION REMOVAL Right 03/04/2015   Procedure: REMOVAL EXTERNAL FIXATION Right Lower Leg;  Surgeon: [name], MD;  Location: [facility] OR;  Service: Orthopedics;  Laterality: Right;  . FRACTURE SURGERY    . HARDWARE REMOVAL Right 09/02/2014   Procedure: Removal Right Lateral Ankle Hardware, Place Antibiotic Bead ;  Surgeon: [name], MD;  Location: [facility] ORS;  Service: Orthopedics;  Laterality: Right;  . INCISION AND DRAINAGE ABSCESS Right 12/19/2014   Procedure: INCISION AND DRAINAGE OF ABSCESS ON RIGHT SIDE OF CHIN;  Surgeon: [name], MD;  Location: [facility] OR;  Service: ENT;  Laterality: Right;  . STUMP REVISION Right 04/14/2015   Procedure: RIGHT BELOW KNEE AMPUTATION REVISION;  Surgeon: [name], MD;  Location: [facility] OR;  Service: Orthopedics;  Laterality: Right;  . TUBAL LIGATION     Social History   Social History  . Marital status: Legally Separated    Spouse name: N/A  . Number of children: N/A  . Years of education: N/A   Social History Main Topics  . Smoking status: Current Every Day Smoker    Packs/day: 1.00    Years: 25.00    Types: Cigarettes  . Smokeless tobacco: Never Used  . Alcohol use Yes     Comment:  " 7 months ago " 01/18/15  . Drug use: Yes    Types: Cocaine, Marijuana, Benzodiazepines     Comment: drug screen +  benzos,cocaine, marijuana pt denies using ( denies curent use 12/17/14  . Sexual activity: Yes    Birth control/ protection: None, Surgical   Other Topics Concern  . None   Social History Narrative   ** Merged History Encounter **       Family History  Problem Relation Age of Onset  . Lung cancer Mother   . Cancer Maternal Uncle   . Cancer Maternal Grandfather   . Cancer Paternal Grandfather    - negative except otherwise stated in the family  history section No Known Allergies Prior to Admission medications   Medication Sig Start Date End Date Taking? Authorizing Provider  ALPRAZolam Prudy Feeler(XANAX) 0.5 MG tablet Take 1 tablet (0.5 mg total) by mouth 2 (two) times daily. Take 0.5mg  by mouth in the afternoon and take 0.5mg  by mouth at bedtime Patient not taking: Reported on 07/08/2016 03/24/15   Henrietta HooverBernhardt, Linda C, NP  ALPRAZolam Prudy Feeler(XANAX) 0.5 MG tablet Take 1 tablet (0.5 mg total) by mouth 2 (two) times daily as needed for anxiety. Patient not taking: Reported on 07/08/2016 01/18/16   Nadara Mustarduda, Emiya Loomer V, MD  buPROPion [facility](WELLBUTRIN SR) 100 MG 12 hr tablet Take 1 tablet (100 mg total) by mouth 2 (two) times daily. 03/24/15   Henrietta HooverBernhardt, Linda C, NP  cyclobenzaprine (FLEXERIL) 10 MG  tablet Take 1 tablet (10 mg total) by mouth 3 (three) times daily as needed for muscle spasms. Patient not taking: Reported on 07/08/2016 12/22/15   Adonis HugueninZamora, Erin R, NP  ferrous sulfate 325 (65 FE) MG tablet Take 1 tablet (325 mg total) by mouth daily with breakfast. Patient not taking: Reported on 07/08/2016 03/28/15   Henrietta HooverBernhardt, Linda C, NP  gabapentin (NEURONTIN) 300 MG capsule Take 1 capsule (300 mg total) by mouth 3 (three) times daily. Patient not taking: Reported on 07/08/2016 03/24/15   Henrietta HooverBernhardt, Linda C, NP  methocarbamol (ROBAXIN) 500 MG tablet Take 1 tablet (500 mg total) by mouth every 6 (six) hours as needed for muscle spasms. Patient not taking: Reported on 07/08/2016 03/24/15   Henrietta HooverBernhardt, Linda C, NP  oxyCODONE (OXYCONTIN) 20 mg 12 hr tablet Take 1 tablet (20 mg total) by mouth every 12 (twelve) hours. Patient not taking: Reported on 07/08/2016 03/15/15   Dowless, Lelon MastSamantha Tripp, PA-C  Oxycodone HCl 10 MG TABS Take 1 tablet (10 mg total) by mouth every 6 (six) hours as needed. Patient not taking: Reported on 07/08/2016 03/07/15   Richarda OverlieAbrol, Nayana, MD  oxyCODONE-acetaminophen (PERCOCET) 10-325 MG tablet Take 1 tablet by mouth every 8 (eight) hours as needed for pain. Patient not taking: Reported on 07/08/2016 01/18/16   Nadara Mustarduda, Ellizabeth Dacruz V, MD  senna-docusate (SENOKOT-S) 8.6-50 MG tablet Take 1 tablet by mouth at bedtime as needed for mild constipation. Patient not taking: Reported on 07/08/2016 03/06/15   Richarda OverlieAbrol, Nayana, MD   Dg Chest 2 View  Result Date: 07/08/2016 CLINICAL DATA:  Chest pain. EXAM: CHEST  2 VIEW COMPARISON:  March 15, 2015 FINDINGS: The heart size and mediastinal contours are within normal limits. Both lungs are clear. The visualized skeletal structures are unremarkable. IMPRESSION: No active cardiopulmonary disease. Electronically Signed   By: Gerome Samavid  Williams III M.D   On: 07/08/2016 16:20   Dg Knee 2 Views Right  Result Date: 07/08/2016 CLINICAL DATA:  Chest pain. EXAM: RIGHT KNEE  - 1-2 VIEW COMPARISON:  None. FINDINGS: The patient is status post a below-the-knee amputation. No soft tissue gas is identified. No fractures or dislocations. The distal stump of the fibula has changed in configuration since the March 15, 2015 study. IMPRESSION: The patient is status post amputation. No soft tissue gas identified. The distal stump of the remaining fibula has changed in configuration since the immediate post amputation images March 15, 2015. There is mild irregularity and bony osteolysis in this region. Osteomyelitis not excluded. Electronically Signed   By: Gerome Samavid  Williams III M.D   On: 07/08/2016 16:24   - pertinent xrays, CT, MRI studies were reviewed and independently interpreted  Positive ROS: All other systems have been reviewed  and were otherwise negative with the exception of those mentioned in the HPI and as above.  Physical Exam: General: Alert, no acute distress Psychiatric: Patient is competent for consent with normal mood and affect Lymphatic: No axillary or cervical lymphadenopathy Cardiovascular: No pedal edema Respiratory: No cyanosis, no use of accessory musculature GI: No organomegaly, abdomen is soft and non-tender  Skin: Examination patient has an area approximately 2 cm in diameter of cellulitis over the fibula. There is a small wound that is draining.   Neurologic: Patient does not have protective sensation bilateral lower extremities.   MUSCULOSKELETAL:  Examination patient has tenderness to palpation over the distal fibula. There is cellulitis. The radiographs show destructive bony changes of distal fibula consistent with chronic osteomyelitis.  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  Plan: Plan: We will plan for revision of the transtibial amputation. Discussed with the patient we could proceed today. Patient states that she does  not want to continue nothing by mouth today and would prefer surgery on Wednesday. Plan for surgery on Wednesday.  Thank you for the consult and the opportunity to see Ms. Lascano  Aldean Baker, MD [facility] [phone] 6:48 AM

## 2016-07-11 NOTE — Interval H&P Note (Signed)
History and Physical Interval Note:  07/11/2016 6:44 AM  Monique [name]  has presented today for surgery, with the diagnosis of Osteomeyelitis Right Below Knee Amputation  The various methods of treatment have been discussed with the patient and family. After consideration of risks, benefits and other options for treatment, the patient has consented to  Procedure(s): Revision Right Below Knee Amputation, Fibula (Right) as a surgical intervention .  The patient's history has been reviewed, patient examined, no change in status, stable for surgery.  I have reviewed the patient's chart and labs.  Questions were answered to the patient's satisfaction.     Nadara MustardMarcus V Duda

## 2016-07-11 NOTE — Anesthesia Procedure Notes (Signed)
Procedure Name: LMA Insertion Date/Time: 07/11/2016 10:26 AM Performed by: Jed LimerickHARDER, Alyjah Lovingood S Pre-anesthesia Checklist: Patient identified, Emergency Drugs available, Suction available and Patient being monitored Patient Re-evaluated:Patient Re-evaluated prior to inductionOxygen Delivery Method: Circle System Utilized Preoxygenation: Pre-oxygenation with 100% oxygen Intubation Type: IV induction Ventilation: Mask ventilation without difficulty LMA: LMA inserted LMA Size: 4.0 Number of attempts: 1 Placement Confirmation: positive ETCO2 Tube secured with: Tape Dental Injury: Teeth and Oropharynx as per pre-operative assessment

## 2016-07-11 NOTE — Progress Notes (Signed)
Orthopedic Tech Progress Note Patient Details:  Monique [name] 06/18/1975 [id number]  Patient ID: Monique [name], female   DOB: 08/24/1975, 41 y.o.   MRN: [id number]   Monique [name], Monique [name] 07/11/2016, 12:45 PM Called in bio-tech brace order; spoke with Monique [name]

## 2016-07-11 NOTE — Consult Note (Signed)
[facility]  Total days of antibiotics 0        Day pre-op Ancef x 1       Admitted: 07/08/2016  Date of Consult: 07/11/2016    Reason for Consult: Cellulitis      Referring Physician: Grandville Silos   Principal Problem:   BKA stump complication ([place]) Active Problems:   Drug abuse   Hypokalemia   Hyponatremia   History of osteomyelitis   Depression with anxiety   HPI: Monique [name] is a 41 y.o. female that was admitted 5/13 with persistent swelling and pain involving the right transtibial BKA stump site (02/2015 by Dr. Sharol Given). Previously she has had problems with osteomyelitis involving this leg prior to amputation. Reports having had intermittent pain, swelling and redness at the site for at least one month with some drainage at first presentation. Denies fevers/chills but positive for malaise.   Hospital Course: Afebrile, slightly tachycardic in low 100s, WBC 12.5. XRays of stump could not exclude OM and showed some mild irregularity. BCx were obtained 5/13 1/2 +CoNS. ESR 6 CRP <0.8. She was not started on antibiotics and is now s/p revision of transtibial BKA.   Past Medical History:  Diagnosis Date  . Anemia   . Anxiety   . Arthritis   . Asthma    PHM  . Bipolar disorder ([place])   . Chronic back pain    "middle and lower" (12/17/2014)  . Depression   . GERD (gastroesophageal reflux disease)   . H/O blood clots   . Infection right ankle  . Migraine    "weekly" (12/17/2014)  . MRSA infection   . Nerve damage of right foot   . 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 No Known Allergies     Current antibiotics: Anti-infectives    Start     Dose/Rate Route Frequency Ordered Stop   07/11/16 1000  ceFAZolin (ANCEF) IVPB 2g/100 mL premix     2 g 200 mL/hr over 30 Minutes Intravenous To Short Stay 07/10/16 1819 07/11/16 1028       MEDICATIONS: . buPROPion  100 mg Oral BID  . Chlorhexidine  Gluconate Cloth  6 each Topical Q0600  . docusate sodium  100 mg Oral BID  . enoxaparin (LOVENOX) injection  40 mg Subcutaneous Q24H  . gabapentin  300 mg Oral TID  . lidocaine  1 patch Transdermal Q24H  . mupirocin ointment  1 application Nasal BID  . pantoprazole  40 mg Oral Q0600    Social History  Substance Use Topics  . Smoking status: Current Every Day Smoker    Packs/day: 1.00    Years: 25.00    Types: Cigarettes  . Smokeless tobacco: Never Used  . Alcohol use Yes     Comment:  " 7 months ago " 01/18/15    Family History  Problem Relation Age of Onset  . Lung cancer Mother   . Cancer Maternal Uncle   . Cancer Maternal Grandfather   . Cancer Paternal Grandfather     Review of Systems - History obtained from chart review and the patient General ROS: Negative for fever, chills, malaise, weight loss Respiratory ROS: no cough, shortness of breath, or wheezing Cardiovascular ROS: no chest pain or dyspnea on exertion Gastrointestinal ROS: no abdominal pain, change in bowel habits, or black or bloody stools Musculoskeletal ROS: unable to tolerate prosthetic recently with pain. Otherwise  negative Neurological ROS: no TIA or stroke symptoms no headache or dizziness Dermatological ROS: negative aside from draining wound on R BKA   OBJECTIVE: Temp:  [97.5 F (36.4 C)-98.3 F (36.8 C)] 98.1 F (36.7 C) (05/16 1204) Pulse Rate:  [59-81] 78 (05/16 1204) Resp:  [12-18] 15 (05/16 1204) BP: (96-132)/(49-71) 121/68 (05/16 1204) SpO2:  [97 %-100 %] 99 % (05/16 1204) Weight:  [195 lb (88.5 kg)] 195 lb (88.5 kg) (05/16 0949)   General appearance: cooperative, no distress and Drowsy during conversation. Falls asleep easily sitting up in bed.  Resp: clear to auscultation bilaterally Cardio: regular rate and rhythm, S1, S2 normal and no murmur  GI: soft, non-tender; bowel sounds normal; no masses,  no organomegaly Extremities: extremities normal, atraumatic, no cyanosis or edema  and RLE with wound VAC in place. Scant amount of thin bloody drainage in tubing.  Pulses: 2+ L DP, R/L radial  Skin: Skin color, texture, turgor normal. No rashes or lesions Neurologic: Mental status: Alert, oriented, thought content appropriate, drowsy during conversation. Appears very anxious  Incision/Wound: R transtibial BKA with intact VAC in place. Swollen appearing surrounding stump. Will not let me palpate d/t pain.   LABS: Results for orders placed or performed during the hospital encounter of 07/08/16 (from the past 48 hour(s))  Basic metabolic panel     Status: Abnormal   Collection Time: 07/10/16  5:52 AM  Result Value Ref Range   Sodium 136 135 - 145 mmol/L   Potassium 4.3 3.5 - 5.1 mmol/L   Chloride 105 101 - 111 mmol/L   CO2 26 22 - 32 mmol/L   Glucose, Bld 157 (H) 65 - 99 mg/dL   BUN 14 6 - 20 mg/dL   Creatinine, Ser 0.79 0.44 - 1.00 mg/dL   Calcium 8.8 (L) 8.9 - 10.3 mg/dL   GFR calc non Af Amer >60 >60 mL/min   GFR calc Af Amer >60 >60 mL/min    Comment: (NOTE) The eGFR has been calculated using the CKD EPI equation. This calculation has not been validated in all clinical situations. eGFR's persistently <60 mL/min signify possible Chronic Kidney Disease.    Anion gap 5 5 - 15  CBC     Status: Abnormal   Collection Time: 07/10/16  5:52 AM  Result Value Ref Range   WBC 5.6 4.0 - 10.5 K/uL   RBC 4.39 3.87 - 5.11 MIL/uL   Hemoglobin 10.6 (L) 12.0 - 15.0 g/dL   HCT 36.5 36.0 - 46.0 %   MCV 83.1 78.0 - 100.0 fL   MCH 24.1 (L) 26.0 - 34.0 pg   MCHC 29.0 (L) 30.0 - 36.0 g/dL   RDW 16.9 (H) 11.5 - 15.5 %   Platelets 280 150 - 400 K/uL  Basic metabolic panel     Status: Abnormal   Collection Time: 07/11/16  5:28 AM  Result Value Ref Range   Sodium 136 135 - 145 mmol/L   Potassium 4.6 3.5 - 5.1 mmol/L   Chloride 101 101 - 111 mmol/L   CO2 28 22 - 32 mmol/L   Glucose, Bld 116 (H) 65 - 99 mg/dL   BUN 12 6 - 20 mg/dL   Creatinine, Ser 0.69 0.44 - 1.00 mg/dL    Calcium 8.9 8.9 - 10.3 mg/dL   GFR calc non Af Amer >60 >60 mL/min   GFR calc Af Amer >60 >60 mL/min    Comment: (NOTE) The eGFR has been calculated using the CKD EPI equation. This calculation  has not been validated in all clinical situations. eGFR's persistently <60 mL/min signify possible Chronic Kidney Disease.    Anion gap 7 5 - 15  CBC     Status: Abnormal   Collection Time: 07/11/16  5:28 AM  Result Value Ref Range   WBC 4.7 4.0 - 10.5 K/uL   RBC 4.23 3.87 - 5.11 MIL/uL   Hemoglobin 10.3 (L) 12.0 - 15.0 g/dL   HCT 35.7 (L) 36.0 - 46.0 %   MCV 84.4 78.0 - 100.0 fL   MCH 24.3 (L) 26.0 - 34.0 pg   MCHC 28.9 (L) 30.0 - 36.0 g/dL   RDW 16.8 (H) 11.5 - 15.5 %   Platelets 285 150 - 400 K/uL    MICRO: Results for orders placed or performed during the hospital encounter of 07/08/16  Blood culture (routine x 2)     Status: None (Preliminary result)   Collection Time: 07/08/16  7:37 PM  Result Value Ref Range Status   Specimen Description BLOOD LEFT ARM  Final   Special Requests   Final    BOTTLES DRAWN AEROBIC AND ANAEROBIC Blood Culture adequate volume   Culture NO GROWTH 2 DAYS  Final   Report Status PENDING  Incomplete  Blood culture (routine x 2)     Status: Abnormal   Collection Time: 07/08/16  7:42 PM  Result Value Ref Range Status   Specimen Description BLOOD LEFT FOREARM  Final   Special Requests IN PEDIATRIC BOTTLE Blood Culture adequate volume  Final   Culture  Setup Time   Final    GRAM POSITIVE COCCI IN CLUSTERS IN PEDIATRIC BOTTLE Organism ID to follow CRITICAL RESULT CALLED TO, READ BACK BY AND VERIFIED WITH: L. POWELL, PHARMD, 07/09/16 AT 1626 BY J FUDESCO    Culture (A)  Final    STAPHYLOCOCCUS SPECIES (COAGULASE NEGATIVE) THE SIGNIFICANCE OF ISOLATING THIS ORGANISM FROM A SINGLE SET OF BLOOD CULTURES WHEN MULTIPLE SETS ARE DRAWN IS UNCERTAIN. PLEASE NOTIFY THE MICROBIOLOGY DEPARTMENT WITHIN ONE WEEK IF SPECIATION AND SENSITIVITIES ARE REQUIRED.    Report  Status 07/10/2016 FINAL  Final  Blood Culture ID Panel (Reflexed)     Status: Abnormal   Collection Time: 07/08/16  7:42 PM  Result Value Ref Range Status   Enterococcus species NOT DETECTED NOT DETECTED Final   Listeria monocytogenes NOT DETECTED NOT DETECTED Final   Staphylococcus species DETECTED (A) NOT DETECTED Final    Comment: Methicillin (oxacillin) susceptible coagulase negative staphylococcus. Possible blood culture contaminant (unless isolated from more than one blood culture draw or clinical case suggests pathogenicity). No antibiotic treatment is indicated for blood  culture contaminants. CRITICAL RESULT CALLED TO, READ BACK BY AND VERIFIED WITH: L.POWELL, PHARMD, 07/09/16 AT 1626 BY J FUDESCO    Staphylococcus aureus NOT DETECTED NOT DETECTED Final   Methicillin resistance NOT DETECTED NOT DETECTED Final   Streptococcus species NOT DETECTED NOT DETECTED Final   Streptococcus agalactiae NOT DETECTED NOT DETECTED Final   Streptococcus pneumoniae NOT DETECTED NOT DETECTED Final   Streptococcus pyogenes NOT DETECTED NOT DETECTED Final   Acinetobacter baumannii NOT DETECTED NOT DETECTED Final   Enterobacteriaceae species NOT DETECTED NOT DETECTED Final   Enterobacter cloacae complex NOT DETECTED NOT DETECTED Final   Escherichia coli NOT DETECTED NOT DETECTED Final   Klebsiella oxytoca NOT DETECTED NOT DETECTED Final   Klebsiella pneumoniae NOT DETECTED NOT DETECTED Final   Proteus species NOT DETECTED NOT DETECTED Final   Serratia marcescens NOT DETECTED NOT DETECTED Final  Haemophilus influenzae NOT DETECTED NOT DETECTED Final   Neisseria meningitidis NOT DETECTED NOT DETECTED Final   Pseudomonas aeruginosa NOT DETECTED NOT DETECTED Final   Candida albicans NOT DETECTED NOT DETECTED Final   Candida glabrata NOT DETECTED NOT DETECTED Final   Candida krusei NOT DETECTED NOT DETECTED Final   Candida parapsilosis NOT DETECTED NOT DETECTED Final   Candida tropicalis NOT DETECTED  NOT DETECTED Final  MRSA PCR Screening     Status: Abnormal   Collection Time: 07/09/16  4:26 AM  Result Value Ref Range Status   MRSA by PCR POSITIVE (A) NEGATIVE Final    Comment:        The GeneXpert MRSA Assay (FDA approved for NASAL specimens only), is one component of a comprehensive MRSA colonization surveillance program. It is not intended to diagnose MRSA infection nor to guide or monitor treatment for MRSA infections. RESULT CALLED TO, READ BACK BY AND VERIFIED WITH: Adela Ports RN 9:45 07/09/16 ([email or web address])     IMAGING: No results found.  HISTORICAL MICRO/IMAGING  Assessment/Plan:    1. Cellulitis of R Transtibial BKA =  -S/P revision and resection of distal fibula for infection by Dr. Sharol Given 5/16 -Given her history and surrounding cellulitis would recommend to treat with Keflex 519m QID x 10 days -MRSA nasal PCR - continue to de colonize with mupirocin ointment  -Encouraged elevation of stump and minimizing trauma with repositioning in bed (observed several times during encounter)  2. CoNS on 1/2 BCx -Given lack of systemic symptoms, fever, leukocytosis and normal inflammatory markers would treat this as a contaminant at this time.   Will recheck HIV and HCVAb given high risk behaviors. Reports she is going to live with a "female friend" after discharge and is very distracted with following up with her therapist. Denies current drug use or IVDU (+ cocaine on admission). CW/CM following.   Available as needed.   SJanene Madeira MSN, NP-C [facility] DCrouchCell: [phone]: [phone] 07/11/2016 2:26 PM

## 2016-07-12 ENCOUNTER — Encounter (HOSPITAL_COMMUNITY): Payer: Self-pay

## 2016-07-12 LAB — BASIC METABOLIC PANEL
Anion gap: 7 (ref 5–15)
BUN: 12 mg/dL (ref 6–20)
CHLORIDE: 98 mmol/L — AB (ref 101–111)
CO2: 28 mmol/L (ref 22–32)
CREATININE: 0.78 mg/dL (ref 0.44–1.00)
Calcium: 8.8 mg/dL — ABNORMAL LOW (ref 8.9–10.3)
GFR calc Af Amer: >60 mL/min (ref >60)
Glucose, Bld: 114 mg/dL — ABNORMAL HIGH (ref 65–99)
POTASSIUM: 4.3 mmol/L (ref 3.5–5.1)
SODIUM: 133 mmol/L — AB (ref 135–145)

## 2016-07-12 LAB — CBC
HCT: 36.1 % (ref 36.0–46.0)
Hemoglobin: 10.6 g/dL — ABNORMAL LOW (ref 12.0–15.0)
MCH: 24.5 pg — ABNORMAL LOW (ref 26.0–34.0)
MCHC: 29.4 g/dL — ABNORMAL LOW (ref 30.0–36.0)
MCV: 83.4 fL (ref 78.0–100.0)
PLATELETS: 304 10*3/uL (ref 150–400)
RBC: 4.33 MIL/uL (ref 3.87–5.11)
RDW: 16.9 % — ABNORMAL HIGH (ref 11.5–15.5)
WBC: 6 10*3/uL (ref 4.0–10.5)

## 2016-07-12 LAB — HIV ANTIBODY (ROUTINE TESTING W REFLEX): HIV Screen 4th Generation wRfx: NONREACTIVE

## 2016-07-12 NOTE — Progress Notes (Signed)
Subjective: 1 Day Post-Op Procedure(s) (LRB): Revision Right Below Knee Amputation, Fibula (Right)  POD #1  States slept well. Patient is complaining of increased pain at surgical site this am. Is resistant to discharge to SNF. Discussed at length. Would like to discuss options for placement with SW.   Objective:   Body mass index is 33.47 kg/m.  VITALS:  Temp:  [97.5 F (36.4 C)-99.2 F (37.3 C)] 99.2 F (37.3 C) (05/17 0438) Pulse Rate:  [69-80] 71 (05/17 0438) Resp:  [13-16] 16 (05/17 0438) BP: (96-121)/(48-69) 96/48 (05/17 0438) SpO2:  [93 %-100 %] 93 % (05/17 0438)   Physical Exam  Constitutional: She is oriented to person, place, and time and well-developed, well-nourished, and in no distress.  Musculoskeletal:  Left below knee amputation with vac dressing over fibula. Good seal to fit. Scant drainage in canister. Minimal swelling to residual limb. Does have 5 mm x 1 cm blister medially under vac dressing.   Neurological: She is alert and oriented to person, place, and time.  Psychiatric: Affect normal.  Nursing note reviewed.    LABS  Recent Labs  07/10/16 0552 07/11/16 0528 07/12/16 0530  HGB 10.6* 10.3* 10.6*  WBC 5.6 4.7 6.0  PLT 280 285 304    Recent Labs  07/11/16 0528 07/12/16 0530  NA 136 133*  K 4.6 4.3  CL 101 98*  CO2 28 28  BUN 12 12  CREATININE 0.69 0.78  GLUCOSE 116* 114*   No results for input(s): LABPT, INR in the last 72 hours.   Assessment/Plan:  POD 1 Vac with good seal to fit. Will continue the vac. Transfer vac to prevena for discharge. To wear shrinker over vac dressing around the clock. ID following for antibiotic recommendations and duration Consider d/c to rehab d/t social issues and need for wound care   Plan for discharge tomorrow Discharge to SNF  Author: Barnie DelErin Zamora, FNP 07/12/2016 11:23 AM

## 2016-07-12 NOTE — Progress Notes (Signed)
    [facility]    Date of Admission:  07/08/2016   Total days of antibiotics 1        Keflex 5/16  ID: Monique [name] is a 41 y.o. female s/p revision of transtibial BKA. Previously she has had problems with osteomyelitis involving this leg prior to amputation.   Principal Problem:   BKA stump complication ([facility]) Active Problems:   Drug abuse   Hypokalemia   Hyponatremia   History of osteomyelitis   Depression with anxiety   Subjective:   Medications:  . buPROPion  100 mg Oral BID  . cephALEXin  500 mg Oral Q6H  . Chlorhexidine Gluconate Cloth  6 each Topical Q0600  . docusate sodium  100 mg Oral BID  . enoxaparin (LOVENOX) injection  40 mg Subcutaneous Q24H  . gabapentin  300 mg Oral TID  . lidocaine  1 patch Transdermal Q24H  . mupirocin ointment  1 application Nasal BID  . pantoprazole  40 mg Oral Q0600    Objective: Vital signs in last 24 hours: Temp:  [98.5 F (36.9 C)-99.2 F (37.3 C)] 99.2 F (37.3 C) (05/17 0438) Pulse Rate:  [71-80] 71 (05/17 0438) Resp:  [16] 16 (05/17 0438) BP: (96-108)/(48-60) 96/48 (05/17 0438) SpO2:  [93 %-98 %] 93 % (05/17 0438)   General appearance: cooperative, no distress and Drowsy during conversation. Falls asleep easily sitting up in the chair.  Resp: clear to auscultation bilaterally Cardio: regular rate and rhythm, S1, S2 normal and no murmur  GI: soft, non-tender; bowel sounds normal; no masses,  no organomegaly Extremities: extremities normal, atraumatic, no cyanosis or edema and RLE with wound VAC in place. Scant amount of thin bloody drainage in tubing.  Pulses: 2+ L DP, R/L radial  Skin: Skin color, texture, turgor normal. No rashes or lesions Neurologic: Mental status: Alert, oriented, thought content appropriate, drowsy during conversation.  Incision/Wound: R transtibial BKA with intact VAC in place. Swollen appearing surrounding stump. Will not let me palpate d/t pain.   Lab  Results  Recent Labs  07/11/16 0528 07/12/16 0530  WBC 4.7 6.0  HGB 10.3* 10.6*  HCT 35.7* 36.1  NA 136 133*  K 4.6 4.3  CL 101 98*  CO2 28 28  BUN 12 12  CREATININE 0.69 0.78   Liver Panel No results for input(s): PROT, ALBUMIN, AST, ALT, ALKPHOS, BILITOT, BILIDIR, IBILI in the last 72 hours. Sedimentation Rate No results for input(s): ESRSEDRATE in the last 72 hours. C-Reactive Protein No results for input(s): CRP in the last 72 hours.  Microbiology:  Studies/Results: No results found.   Assessment/Plan: 1. Cellulitis of R Transtibial BKA =  -S/P revision and resection of distal fibula for infection by Dr. Lajoyce Cornersuda 5/16 -Continue as planned Keflex 500mg  QID x 10 days total  -MRSA nasal PCR - continue to de colonize with mupirocin ointment  -Encouraged elevation of stump and minimizing trauma with repositioning in bed (observed several times during encounter)  2. CoNS on 1/2 BCx  2nd BCx 5/14 >> NGTD -Given lack of systemic symptoms, fever, leukocytosis and normal inflammatory markers would treat this as a contaminant at this time.   3. Drub abuse -HIV pending -HCV Ab pending  Rexene AlbertsStephanie Dixon, MSN, NP-C [facility] Cell: [phone] Pager: [phone]  07/12/2016 12:34 PM   07/12/2016, 12:31 PM

## 2016-07-12 NOTE — Anesthesia Postprocedure Evaluation (Addendum)
Anesthesia Post Note  Patient: Monique [name]  Procedure(s) Performed: Procedure(s) (LRB): Revision Right Below Knee Amputation, Fibula (Right)  Patient location during evaluation: PACU Anesthesia Type: General Level of consciousness: awake and alert Pain management: pain level controlled Vital Signs Assessment: post-procedure vital signs reviewed and stable Respiratory status: spontaneous breathing, nonlabored ventilation, respiratory function stable and patient connected to nasal cannula oxygen Cardiovascular status: blood pressure returned to baseline and stable Postop Assessment: no signs of nausea or vomiting Anesthetic complications: no       Last Vitals:  Vitals:   07/11/16 1959 07/12/16 0438  BP: 108/60 (!) 96/48  Pulse: 80 71  Resp: 16 16  Temp: 36.9 C 37.3 C    Last Pain:  Vitals:   07/12/16 0438  TempSrc: Oral  PainSc:                  Davanta Meuser

## 2016-07-12 NOTE — [place] FL2 (Signed)
[place] MEDICAID FL2 LEVEL OF CARE SCREENING TOOL     IDENTIFICATION  Patient Name: Monique [name] Birthdate: 09-21-1975 Sex: female Admission Date (Current Location): 07/08/2016  [facility] and [place] Number:  Producer, television/film/video and Address:  [facility], [address] [place], [place] [address]      Provider Number: [id number]  Attending Physician Name and Address:  Rodolph Bong, MD  Relative Name and Phone Number:       Current Level of Care: Hospital Recommended Level of Care: Skilled Nursing Facility Prior Approval Number:    Date Approved/Denied: 07/12/16 PASRR Number: pending  Discharge Plan: SNF    Current Diagnoses: Patient Active Problem List   Diagnosis Date Noted  . History of osteomyelitis   . Depression with anxiety   . Hypokalemia 07/08/2016  . Hyponatremia 07/08/2016  . 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 Polysubstance abuse 03/15/2015  . 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 Phantom pain (HCC) 03/15/2015  . Elevated lactic acid level 03/15/2015  . BKA stump complication (HCC)   . Fever   . Acute osteomyelitis of right foot (HCC)   . Osteomyelitis (HCC) 03/02/2015  . Cellulitis 03/02/2015  . 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 Cellulitis of face   . Cellulitis and abscess 12/17/2014  . Nicotine abuse 12/17/2014  . Anemia, chronic disease 12/17/2014  . Septic arthritis of right ankle (HCC) 10/01/2014  . Septic arthritis of left ankle (HCC) 09/30/2014  . Conjunctivitis, left eye   . Wound infection after surgery 09/29/2014  . Irritation of left eye 09/29/2014  . Wound, surgical, infected 09/29/2014  . Osteomyelitis of right ankle (HCC)   . Acute encephalopathy 08/29/2014  . Drug abuse 08/29/2014  . Cocaine abuse 08/29/2014  . Acute respiratory failure with hypoxia (HCC) 08/29/2014    Orientation RESPIRATION BLADDER Height &  Weight     Self, Time, Situation, Place  Normal Continent Weight: 195 lb (88.5 kg) Height:  5\' 4"  (162.6 cm)  BEHAVIORAL SYMPTOMS/MOOD NEUROLOGICAL BOWEL NUTRITION STATUS      Continent Diet (See DC Summary)  AMBULATORY STATUS COMMUNICATION OF NEEDS Skin   Limited Assist Verbally Surgical wounds, Wound Vac (Right leg closed incision, wound vac pravena)                       Personal Care Assistance Level of Assistance  Bathing, Feeding, Dressing Bathing Assistance: Limited assistance Feeding assistance: Independent Dressing Assistance: Limited assistance     Functional Limitations Info  Sight, Hearing, Speech Sight Info: Adequate Hearing Info: Adequate Speech Info: Adequate    SPECIAL CARE FACTORS FREQUENCY  PT (By licensed PT), OT (By licensed OT)     PT Frequency: 5xweek OT Frequency: 5xweek            Contractures      Additional Factors Info  Code Status, Allergies, Psychotropic Code Status Info: Full Allergies Info: NKA Psychotropic Info: Wellbutrin         Current Medications (07/12/2016):  This is the current hospital active medication list Current Facility-Administered Medications  Medication Dose Route Frequency Provider Last Rate Last Dose  . 0.9 %  sodium chloride infusion   Intravenous Continuous [name], MD 10 mL/hr at 07/11/16 1333    . acetaminophen (TYLENOL) tablet 650 mg  650 mg Oral Q6H PRN Briscoe Deutscher, MD   650 mg at 07/10/16  2342   Or  . acetaminophen (TYLENOL) suppository 650 mg  650 mg Rectal Q6H PRN Opyd, Lavone Neri, MD      . acetaminophen (TYLENOL) tablet 650 mg  650 mg Oral Q6H PRN [name], MD   650 mg at 07/11/16 1214   Or  . acetaminophen (TYLENOL) suppository 650 mg  650 mg Rectal Q6H PRN [name], MD      . ALPRAZolam Prudy Feeler) tablet 0.5 mg  0.5 mg Oral BID PRN Briscoe Deutscher, MD   0.5 mg at 07/12/16 0516  . bisacodyl (DULCOLAX) suppository 10 mg  10 mg Rectal Daily PRN [name], MD      .  buPROPion [facility] SR) 12 hr tablet 100 mg  100 mg Oral BID Briscoe Deutscher, MD   100 mg at 07/12/16 0819  . calcium carbonate (TUMS - dosed in mg elemental calcium) chewable tablet 200 mg of elemental calcium  1 tablet Oral TID WC PRN Rai, Ripudeep K, MD   200 mg of elemental calcium at 07/11/16 0311  . cephALEXin (KEFLEX) capsule 500 mg  500 mg Oral Q6H Dixon, Gomez Cleverly, NP   500 mg at 07/12/16 1357  . Chlorhexidine Gluconate Cloth 2 % PADS 6 each  6 each Topical Q0600 Rai, Delene Ruffini, MD   6 each at 07/11/16 0557  . cyclobenzaprine (FLEXERIL) tablet 10 mg  10 mg Oral TID PRN Opyd, Lavone Neri, MD   10 mg at 07/12/16 0011  . docusate sodium (COLACE) capsule 100 mg  100 mg Oral BID [name], MD   100 mg at 07/12/16 1034  . enoxaparin (LOVENOX) injection 40 mg  40 mg Subcutaneous Q24H Opyd, Lavone Neri, MD   40 mg at 07/11/16 2118  . gabapentin (NEURONTIN) capsule 300 mg  300 mg Oral TID Briscoe Deutscher, MD   300 mg at 07/12/16 1034  . HYDROmorphone (DILAUDID) injection 1 mg  1 mg Intravenous Q2H PRN [name], MD   1 mg at 07/12/16 1351  . lactated ringers infusion   Intravenous Continuous Val Eagle, MD 10 mL/hr at 07/11/16 [phone]    . lidocaine (LIDODERM) 5 % 1 patch  1 patch Transdermal Q24H Lindalou Hose, MD   1 patch at 07/11/16 1652  . magnesium citrate solution 1 Bottle  1 Bottle Oral Once PRN [name], MD      . methocarbamol (ROBAXIN) tablet 500 mg  500 mg Oral Q6H PRN [name], MD   500 mg at 07/12/16 1034   Or  . methocarbamol (ROBAXIN) 500 mg in dextrose 5 % 50 mL IVPB  500 mg Intravenous Q6H PRN [name], MD   Stopped at 07/11/16 1154  . metoCLOPramide (REGLAN) tablet 5-10 mg  5-10 mg Oral Q8H PRN [name], MD       Or  . metoCLOPramide (REGLAN) injection 5-10 mg  5-10 mg Intravenous Q8H PRN [name], MD      . mupirocin ointment (BACTROBAN) 2 % 1 application  1 application Nasal BID Rai, Delene Ruffini, MD   1 application at 07/12/16 0831   . ondansetron (ZOFRAN) tablet 4 mg  4 mg Oral Q6H PRN Opyd, Lavone Neri, MD       Or  . ondansetron (ZOFRAN) injection 4 mg  4 mg Intravenous Q6H PRN Opyd, Lavone Neri, MD   4 mg at 07/09/16 2259  . ondansetron (ZOFRAN) tablet 4 mg  4 mg Oral  Q6H PRN Nadara Mustarduda, Marcus V, MD       Or  . ondansetron [facility](ZOFRAN) injection 4 mg  4 mg Intravenous Q6H PRN Nadara Mustarduda, Marcus V, MD      . oxyCODONE-acetaminophen (PERCOCET/ROXICET) 5-325 MG per tablet 1 tablet  1 tablet Oral Q6H PRN Opyd, Lavone Neriimothy S, MD   1 tablet at 07/12/16 0217   And  . oxyCODONE (Oxy IR/ROXICODONE) immediate release tablet 5 mg  5 mg Oral Q6H PRN Opyd, Lavone Neriimothy S, MD   5 mg at 07/12/16 0218  . oxyCODONE (Oxy IR/ROXICODONE) immediate release tablet 5-10 mg  5-10 mg Oral Q3H PRN Nadara Mustarduda, Marcus V, MD   10 mg at 07/12/16 1354  . pantoprazole (PROTONIX) EC tablet 40 mg  40 mg Oral Q0600 Rai, Ripudeep K, MD   40 mg at 07/11/16 0556  . polyethylene glycol (MIRALAX / GLYCOLAX) packet 17 g  17 g Oral Daily PRN Nadara Mustarduda, Marcus V, MD         Discharge Medications: Please see discharge summary for a list of discharge medications.  Relevant Imaging Results:  Relevant Lab Results:   Additional Information ZO:[id number]  Tresa MoorePatricia V Shandell Jallow, LCSW

## 2016-07-12 NOTE — Evaluation (Signed)
Physical Therapy Evaluation Patient Details Name: Monique [name] MRN: [id number] DOB: 09/24/1975 Today's Date: 07/12/2016   History of Present Illness   Monique FlemingsKimberly E [name] is a  41 y.o. female with a diagnosis of Osteomeyelitis Right Below Knee Amputation who failed conservative measures.  Pt needed right BKA revision with placement of VAC. PMH:  anxiety, depression, polysubstance abuse, cocaine abuse, chronic pain  Clinical Impression  Pt admitted with above diagnosis. Pt currently with functional limitations due to the deficits listed below (see PT Problem List). Pt was able to stand pivot to chair with min assist and cues.  Pt lethargic.  Decr safety. Has no equipment at home. Feel that she will need SNF.  Will follow acutely.  Pt will benefit from skilled PT to increase their independence and safety with mobility to allow discharge to the venue listed below.      Follow Up Recommendations SNF;Supervision/Assistance - 24 hour    Equipment Recommendations  Other (comment) (TBA)    Recommendations for Other Services       Precautions / Restrictions Precautions Precautions: Fall Restrictions Weight Bearing Restrictions: Yes RLE Weight Bearing: Non weight bearing      Mobility  Bed Mobility Overal bed mobility: Independent                Transfers Overall transfer level: Needs assistance Equipment used: 1 person hand held assist Transfers: Sit to/from Stand;Stand Pivot Transfers Sit to Stand: Min assist Stand pivot transfers: Min assist       General transfer comment: Initially when pt stood she was trying to pivot all the way around (360 degrees) instead of going toward chair on her left.  Pt encouraged to transfer stratight to chair on her left and she was irritated at first but then did transfer to chair with HHA of 1 and cues.  falling asleep quickly once in chair.   Ambulation/Gait                Stairs            Wheelchair Mobility     Modified Rankin (Stroke Patients Only)       Balance Overall balance assessment: Needs assistance;History of Falls Sitting-balance support: No upper extremity supported;Feet supported Sitting balance-Leahy Scale: Good     Standing balance support: Single extremity supported;During functional activity Standing balance-Leahy Scale: Poor Standing balance comment: Pt neeeded guard assist for stand pivot transfer.  Unsafe more due to pt impulsivity and not following commands.                              Pertinent Vitals/Pain Pain Assessment: Faces Faces Pain Scale: Hurts even more Pain Location: right BKA Pain Descriptors / Indicators: Aching;Grimacing;Guarding;Operative site guarding Pain Intervention(s): Limited activity within patient's tolerance;Monitored during session;Premedicated before session;Repositioned    Home Living Family/patient expects to be discharged to:: Skilled nursing facility                 Additional Comments: MD pushing SNF. Pt tells this PT she lived in apartment and bumped up and down steps to 3 floor apt.      Prior Function Level of Independence: Independent with assistive device(s)         Comments: Pt reports having a prosthesis she wore.  She states her wheelchair broke and she gave her RW to her grandma. Pt is questionable historian.      Hand Dominance   Dominant  Hand: Right    Extremity/Trunk Assessment   Upper Extremity Assessment Upper Extremity Assessment: Defer to OT evaluation    Lower Extremity Assessment Lower Extremity Assessment: RLE deficits/detail RLE: Unable to fully assess due to pain    Cervical / Trunk Assessment Cervical / Trunk Assessment: Normal  Communication   Communication: No difficulties  Cognition Arousal/Alertness: Lethargic;Suspect due to medications Behavior During Therapy: Flat affect;Impulsive Overall Cognitive Status: History of cognitive impairments - at baseline                                  General Comments: Poor safety overall.       General Comments General comments (skin integrity, edema, etc.): VAC in place on right residual limb.     Exercises     Assessment/Plan    PT Assessment Patient needs continued PT services  PT Problem List Decreased strength;Decreased range of motion;Decreased activity tolerance;Decreased balance;Decreased mobility;Decreased knowledge of use of DME;Decreased knowledge of precautions;Decreased safety awareness;Pain;Decreased skin integrity       PT Treatment Interventions DME instruction;Functional mobility training;Therapeutic activities;Therapeutic exercise;Balance training;Patient/family education;Wheelchair mobility training    PT Goals (Current goals can be found in the Care Plan section)  Acute Rehab PT Goals Patient Stated Goal: unable to state PT Goal Formulation: With patient Time For Goal Achievement: 07/26/16 Potential to Achieve Goals: Fair    Frequency Min 3X/week   Barriers to discharge Decreased caregiver support      Co-evaluation               AM-PAC PT "6 Clicks" Daily Activity  Outcome Measure Difficulty turning over in bed (including adjusting bedclothes, sheets and blankets)?: None Difficulty moving from lying on back to sitting on the side of the bed? : None Difficulty sitting down on and standing up from a chair with arms (e.g., wheelchair, bedside commode, etc,.)?: A Little Help needed moving to and from a bed to chair (including a wheelchair)?: A Lot Help needed walking in hospital room?: Total Help needed climbing 3-5 steps with a railing? : Total 6 Click Score: 15    End of Session Equipment Utilized During Treatment: Gait belt Activity Tolerance: Patient limited by fatigue;Patient limited by pain Patient left: in chair;with call bell/phone within reach;with chair alarm set Nurse Communication: Mobility status PT Visit Diagnosis: Unsteadiness on feet  (R26.81);Muscle weakness (generalized) (M62.81);Pain Pain - Right/Left: Right Pain - part of body: Knee    Time: 4098-1191 PT Time Calculation (min) (ACUTE ONLY): 17 min   Charges:   PT Evaluation $PT Eval Moderate Complexity: 1 Procedure     PT G Codes:        Natisha Trzcinski,PT Acute Rehabilitation [phone] (pager)   Berline Lopes 07/12/2016, 1:28 PM

## 2016-07-12 NOTE — Progress Notes (Signed)
PROGRESS NOTE    Monique [name]  JTT:[id number] DOB: 22-Sep-1975 DOA: 07/08/2016 PCP: Patient, [name]    Brief Narrative:  The patient is a 41 year old female with anxiety, depression, polysubstance abuse, cocaine abuse, chronic pain, history of osteoarthritis involving the right lower leg status post BKA presented with pain, swelling, redness involving the BKA stump, for at least a month. X-ray right BKA stump showed mild irregularity and osteo-myelitis could not be ruled out. Orthopedics consulted.   Assessment & Plan:   Principal Problem:   BKA stump complication ([place]) Active Problems:   Drug abuse   Hypokalemia   Hyponatremia   History of osteomyelitis   Depression with anxiety  Right BKA Cellulitis ?underlying osteomyelitis  - Patient presented with right BKA stump erythema, tenderness, leukocytosis, x-ray with bony irregularity, cannot exclude osteomyelitis. - ESR CRP normal - Orthopedics consulted, seen by Dr. Sharol Given, recommended revision of transtibial amputation, which was done 5/16. - Patient seen in consultation by ID who recommended 10 day course of Keflex 500 mg by mouth 4 times a day to treat cellulitis. His felt per ID that infected bone has been removed and no need for IV antibiotics. - Likely stable for discharge tomorrow per orthopedics to skilled nursing facility.  - Per orthopedics.   Active problems  Polysubstance abuse  -  patient has a history of cocaine, opiate abuse, IVDA  Urine drug screen positive for cocaine - Social work consultation requested   Hyponatremia  - Mild, likely secondary to hypovolemia  - Resolved with hydration.   Chronic pain  - Pt complains of pain at stump site  - Continue current regimen of Percocet and Flexeril, IV dilaudid prn. DC IV morphine. Per orthopedics.  Depression, anxiety  - Continue home regimen of Wellbutrin and prn Xanax   Hyperglycemia Hemoglobin A1c 5.5. Diet has been changed patient  currently on a regular diet.   GERD - Continue PPI. Tums.    DVT prophylaxis: Lovenox Code Status: Full Family Communication: Updated patient. No family at bedside. Disposition Plan: Likely home versus SNF per orthopedics, tomorrow.   Consultants:   Orthopedics: Dr Sharol Given 07/09/2016  ID: Dr. Baxter Flattery 07/11/2016  Procedures:   Chest x-ray 07/08/2016  Plain films right knee 07/08/2016 Revision Right Below Knee Amputation, Fibula Application of wound VAC.--Dr Sharol Given 07/11/2016  Antimicrobials:   Keflex 07/11/16     Subjective: Patient complaining of right stump pain. Patient denies any chest pain. No shortness of breath. Patient very hesitant to go to skilled nursing facility and wanted to be discharged home instead.  Objective: Vitals:   07/11/16 1145 07/11/16 1204 07/11/16 1959 07/12/16 0438  BP: (!) 96/49 121/68 108/60 (!) 96/48  Pulse: 69 78 80 71  Resp: _0 Temp: 97.5 F (36.4 C) 98.1 F (36.7 C) 98.5 F (36.9 C) 99.2 F (37.3 C)  TempSrc:  Oral Oral Oral  SpO2: 100% 99% 98% 93%  Weight:      Height:        Intake/Output Summary (Last 24 hours) at 07/12/16 1319 Last data filed at 07/12/16 0830  Gross per 24 hour  Intake            549.5 ml  Output             1500 ml  Net           -950.5 ml   Filed Weights   07/11/16 0949  Weight: 88.5 kg (195 lb)    Examination:  General exam: Appears calm and comfortable  Respiratory system: Clear to auscultation. Respiratory effort normal. Cardiovascular system: S1 & S2 heard, RRR. No JVD, murmurs, rubs, gallops or clicks. No pedal edema. Gastrointestinal system: Abdomen is nondistended, soft and nontender. No organomegaly or masses felt. Normal bowel sounds heard. Central nervous system: Alert and oriented. No focal neurological deficits. Extremities: Right lower extremity status post right BKA with wound VAC.  Skin: No rashes, lesions or ulcers Psychiatry: Judgement and insight appear normal. Mood &  affect appropriate.     Data Reviewed: I have personally reviewed following labs and imaging studies  CBC:  Recent Labs Lab 07/08/16 1640 07/08/16 1649 07/09/16 0515 07/10/16 0552 07/11/16 0528 07/12/16 0530  WBC 12.5*  --  6.3 5.6 4.7 6.0  NEUTROABS 8.6*  --   --   --   --   --   HGB 12.7 13.9 11.2* 10.6* 10.3* 10.6*  HCT 40.6 41.0 36.4 36.5 35.7* 36.1  MCV 80.1  --  81.6 83.1 84.4 83.4  PLT 335  --  295 280 285 449   Basic Metabolic Panel:  Recent Labs Lab 07/08/16 1640 07/08/16 1649 07/09/16 0515 07/10/16 0552 07/11/16 0528 07/12/16 0530  NA 134* 136 137 136 136 133*  K 3.4* 3.5 3.9 4.3 4.6 4.3  CL 101 100* 104 105 101 98*  CO2 23  --  _0 GLUCOSE 145* 148* 116* 157* 116* 114*  BUN 17 21* 23* _1 CREATININE 0.85 0.70 0.85 0.79 0.69 0.78  CALCIUM 9.1  --  8.3* 8.8* 8.9 8.8*  MG  --   --  2.1  --   --   --    GFR: Estimated Creatinine Clearance: 100.6 mL/min (by C-G formula based on SCr of 0.78 mg/dL). Liver Function Tests: No results for input(s): AST, ALT, ALKPHOS, BILITOT, PROT, ALBUMIN in the last 168 hours. No results for input(s): LIPASE, AMYLASE in the last 168 hours. No results for input(s): AMMONIA in the last 168 hours. Coagulation Profile: No results for input(s): INR, PROTIME in the last 168 hours. Cardiac Enzymes: No results for input(s): CKTOTAL, CKMB, CKMBINDEX, TROPONINI in the last 168 hours. BNP (last 3 results) No results for input(s): PROBNP in the last 8760 hours. HbA1C: No results for input(s): HGBA1C in the last 72 hours. CBG: No results for input(s): GLUCAP in the last 168 hours. Lipid Profile: No results for input(s): CHOL, HDL, LDLCALC, TRIG, CHOLHDL, LDLDIRECT in the last 72 hours. Thyroid Function Tests: No results for input(s): TSH, T4TOTAL, FREET4, T3FREE, THYROIDAB in the last 72 hours. Anemia Panel: No results for input(s): VITAMINB12, FOLATE, FERRITIN, TIBC, IRON, RETICCTPCT in the last 72 hours. Sepsis  Labs: No results for input(s): PROCALCITON, LATICACIDVEN in the last 168 hours.  Recent Results (from the past 240 hour(s))  Blood culture (routine x 2)     Status: None (Preliminary result)   Collection Time: 07/08/16  7:37 PM  Result Value Ref Range Status   Specimen Description BLOOD LEFT ARM  Final   Special Requests   Final    BOTTLES DRAWN AEROBIC AND ANAEROBIC Blood Culture adequate volume   Culture NO GROWTH 4 DAYS  Final   Report Status PENDING  Incomplete  Blood culture (routine x 2)     Status: Abnormal   Collection Time: 07/08/16  7:42 PM  Result Value Ref Range Status   Specimen Description BLOOD LEFT FOREARM  Final   Special Requests IN PEDIATRIC BOTTLE Blood  Culture adequate volume  Final   Culture  Setup Time   Final    GRAM POSITIVE COCCI IN CLUSTERS IN PEDIATRIC BOTTLE Organism ID to follow CRITICAL RESULT CALLED TO, READ BACK BY AND VERIFIED WITH: L. POWELL, PHARMD, 07/09/16 AT 1626 BY J FUDESCO    Culture (A)  Final    STAPHYLOCOCCUS SPECIES (COAGULASE NEGATIVE) THE SIGNIFICANCE OF ISOLATING THIS ORGANISM FROM A SINGLE SET OF BLOOD CULTURES WHEN MULTIPLE SETS ARE DRAWN IS UNCERTAIN. PLEASE NOTIFY THE MICROBIOLOGY DEPARTMENT WITHIN ONE WEEK IF SPECIATION AND SENSITIVITIES ARE REQUIRED.    Report Status 07/10/2016 FINAL  Final  Blood Culture ID Panel (Reflexed)     Status: Abnormal   Collection Time: 07/08/16  7:42 PM  Result Value Ref Range Status   Enterococcus species NOT DETECTED NOT DETECTED Final   Listeria monocytogenes NOT DETECTED NOT DETECTED Final   Staphylococcus species DETECTED (A) NOT DETECTED Final    Comment: Methicillin (oxacillin) susceptible coagulase negative staphylococcus. Possible blood culture contaminant (unless isolated from more than one blood culture draw or clinical case suggests pathogenicity). No antibiotic treatment is indicated for blood  culture contaminants. CRITICAL RESULT CALLED TO, READ BACK BY AND VERIFIED WITH: L.POWELL,  PHARMD, 07/09/16 AT 1626 BY J FUDESCO    Staphylococcus aureus NOT DETECTED NOT DETECTED Final   Methicillin resistance NOT DETECTED NOT DETECTED Final   Streptococcus species NOT DETECTED NOT DETECTED Final   Streptococcus agalactiae NOT DETECTED NOT DETECTED Final   Streptococcus pneumoniae NOT DETECTED NOT DETECTED Final   Streptococcus pyogenes NOT DETECTED NOT DETECTED Final   Acinetobacter baumannii NOT DETECTED NOT DETECTED Final   Enterobacteriaceae species NOT DETECTED NOT DETECTED Final   Enterobacter cloacae complex NOT DETECTED NOT DETECTED Final   Escherichia coli NOT DETECTED NOT DETECTED Final   Klebsiella oxytoca NOT DETECTED NOT DETECTED Final   Klebsiella pneumoniae NOT DETECTED NOT DETECTED Final   Proteus species NOT DETECTED NOT DETECTED Final   Serratia marcescens NOT DETECTED NOT DETECTED Final   Haemophilus influenzae NOT DETECTED NOT DETECTED Final   Neisseria meningitidis NOT DETECTED NOT DETECTED Final   Pseudomonas aeruginosa NOT DETECTED NOT DETECTED Final   Candida albicans NOT DETECTED NOT DETECTED Final   Candida glabrata NOT DETECTED NOT DETECTED Final   Candida krusei NOT DETECTED NOT DETECTED Final   Candida parapsilosis NOT DETECTED NOT DETECTED Final   Candida tropicalis NOT DETECTED NOT DETECTED Final  MRSA PCR Screening     Status: Abnormal   Collection Time: 07/09/16  4:26 AM  Result Value Ref Range Status   MRSA by PCR POSITIVE (A) NEGATIVE Final    Comment:        The GeneXpert MRSA Assay (FDA approved for NASAL specimens only), is one component of a comprehensive MRSA colonization surveillance program. It is not intended to diagnose MRSA infection nor to guide or monitor treatment for MRSA infections. RESULT CALLED TO, READ BACK BY AND VERIFIED WITH: Adela Ports RN 9:45 07/09/16 ([email or web address])          Radiology Studies: No results found.      Scheduled Meds: . buPROPion  100 mg Oral BID  . cephALEXin  500 mg Oral Q6H  .  Chlorhexidine Gluconate Cloth  6 each Topical Q0600  . docusate sodium  100 mg Oral BID  . enoxaparin (LOVENOX) injection  40 mg Subcutaneous Q24H  . gabapentin  300 mg Oral TID  . lidocaine  1 patch Transdermal Q24H  . mupirocin ointment  1 application  Nasal BID  . pantoprazole  40 mg Oral Q0600   Continuous Infusions: . sodium chloride 10 mL/hr at 07/11/16 1333  . lactated ringers 10 mL/hr at 07/11/16 0952  . methocarbamol (ROBAXIN)  IV Stopped (07/11/16 1154)     LOS: 4 days    Time spent: 93 minutes    THOMPSON,DANIEL, MD [facility] Pager [phone]  If 7PM-7AM, please contact night-coverage [email or web address] Password [email or web address] 07/12/2016, 1:19 PM

## 2016-07-13 DIAGNOSIS — L02419 Cutaneous abscess of limb, unspecified: Secondary | ICD-10-CM

## 2016-07-13 DIAGNOSIS — L03119 Cellulitis of unspecified part of limb: Secondary | ICD-10-CM

## 2016-07-13 LAB — CULTURE, BLOOD (ROUTINE X 2)
Culture: NO GROWTH
SPECIAL REQUESTS: ADEQUATE

## 2016-07-13 LAB — HEPATITIS C ANTIBODY: HCV Ab: <0.1 s/co ratio (ref 0.0–0.9)

## 2016-07-13 MED ORDER — CEPHALEXIN 500 MG PO CAPS: 500.0000 mg | ORAL_CAPSULE | Freq: Four times a day (QID) | ORAL | 0 refills | Status: AC

## 2016-07-13 MED ORDER — OXYCODONE-ACETAMINOPHEN 10-325 MG PO TABS: 1.0000 | ORAL_TABLET | Freq: Three times a day (TID) | ORAL | 0 refills | Status: DC | PRN

## 2016-07-13 MED ORDER — DOCUSATE SODIUM 100 MG PO CAPS: 100.0000 mg | ORAL_CAPSULE | Freq: Two times a day (BID) | ORAL | 0 refills | Status: AC

## 2016-07-13 MED ORDER — POLYETHYLENE GLYCOL 3350 17 G PO PACK: 17.0000 g | PACK | Freq: Every day | ORAL | 0 refills | Status: DC | PRN

## 2016-07-13 MED ORDER — PANTOPRAZOLE SODIUM 40 MG PO TBEC: 40.0000 mg | DELAYED_RELEASE_TABLET | Freq: Every day | ORAL | 3 refills | Status: DC

## 2016-07-13 MED ORDER — LIDOCAINE 5 % EX PTCH: 1.0000 | MEDICATED_PATCH | CUTANEOUS | 0 refills | Status: AC

## 2016-07-13 NOTE — Progress Notes (Signed)
Pt has orders to discharge from unit to home. Upon entering pts room with another RN to tell pt we had her d/c paperwork as she has been discharged, pt is loud and asking about her walker and wheelchair. Stated to pt that we were informed by CM that there would be no walkers issued or wheelchair issued as [facility] due to the number issued in the past.  Pt sits up in bed and raises voice that "nurse comes in here telling me that I can't get wheelchair, how am I supposed to get home,"  Asked pt not to raise her voice to me and attempted to advise pt that she would be escorted from her room to the front exit in a wheelchair as other pts are discharged.  Pt proceeds to state she didn't come in here to have her leg cut off and now "that nurse" is telling me I can't get equipment.  When asking pt what time her ride would be here she states that she doesn't have a ride. Explained that she had been discharged from physician care and would need to seek a ride home.  Pt requesting that this scriber not speak to her anymore. Request assistance of charge nurse. Charge nurse enters room and pt states that "that nurse that came in saying I wasn't getting any *&^%F wheelchair or walker. How does she think I am going to leave ? This scriber contacted SW from ED to provided cab voucher ford pt to discharge to home. Current wound vac switched to Provena Plus for home use.  Pt declined last meds for the evening.  Please refer to previous note from caregivers during this hospital stay.

## 2016-07-13 NOTE — Clinical Social Work Note (Signed)
Clinical Social Work Assessment  Patient Details  Name: Monique [name] MRN: [id number] Date of Birth: 16-Apr-1975  Date of referral:  07/12/16               Reason for consult:  Facility Placement                Permission sought to share information with:  Facility Art therapist granted to share information::  Yes, Verbal Permission Granted  Name::        Agency::  SNF  Relationship::     Contact Information:     Housing/Transportation Living arrangements for the past 2 months:  Apartment Source of Information:  Patient Patient Interpreter Needed:  None Criminal Activity/Legal Involvement Pertinent to Current Situation/Hospitalization:  No - Comment as needed Significant Relationships:  Friend Lives with:  Friends Do you feel safe going back to the place where you live?  No Need for family participation in patient care:  No (Coment)  Care giving concerns:  Patient resides with friend and indicated that she is not able to care for self independently.  Social Worker assessment / plan:  CSW met with patient this day to discuss DC planning to SNF. Patient is uncertain if that is what she wants to do and is concerned about giving up her monthly income. CSW validated her concerns.  CSW explained role and discussed SNF options/placements.  Patient gave verbal permission to send offers out to local SNF's.  Patient requested to discuss home options. CSW will f/u with RNCM to provide information.  Employment status:  Disabled (Comment on whether or not currently receiving Disability) Insurance information:  Medicaid In [place] PT Recommendations:  [place] / Referral to community resources:  [place]  Patient/Family's Response to care:  Patient appreciative of assistance by CSW and has concerns about going home vs. Going to SNF. No other issues reported at this time.    Patient/Family's Understanding of and Emotional  Response to Diagnosis, Current Treatment, and Prognosis:  Patient has good understanding of diagnosis, current treatment and prognosis. Patient is ambivalent about going home or to SNF. CSW will f/u.  Emotional Assessment Appearance:  Appears stated age Attitude/Demeanor/Rapport:  Apprehensive (Cooperative) Affect (typically observed):  Accepting, Appropriate Orientation:  Oriented to Self, Oriented to Place, Oriented to  Time, Oriented to Situation Alcohol / Substance use:  Not Applicable Psych involvement (Current and /or in the community):  Outpatient Provider  Discharge Needs  Concerns to be addressed:  Care Coordination Readmission within the last 30 days:  No Current discharge risk:  Physical Impairment, Dependent with Mobility Barriers to Discharge:  No Barriers Identified   Normajean Baxter, LCSW 07/13/2016, 10:49 AM

## 2016-07-13 NOTE — Progress Notes (Signed)
Cab called and has arrived. Patient escorted downstairs to cab in wheelchair with NT assistant. All belongings packed and brought down with patient.

## 2016-07-13 NOTE — Discharge Summary (Signed)
Physician Discharge Summary  Monique [name] DJM:[id number] DOB: 1976/01/29 DOA: 07/08/2016  PCP: Patient, [name]  Admit date: 07/08/2016 Discharge date: 07/13/2016  Time spent: 65 minutes  Recommendations for Outpatient Follow-up:  1. Follow up with Dr Sharol Given in 1 week 2. Follow up with PCP in 2 weeks.   Discharge Diagnoses:  Principal Problem:   BKA stump complication ([place]) Active Problems:   Drug abuse   Hypokalemia   Hyponatremia   History of osteomyelitis   Depression with anxiety   Cellulitis and abscess of leg   Discharge Condition: stable and improved.  Diet recommendation: regular diet  Filed Weights   07/11/16 0949  Weight: 88.5 kg (195 lb)    History of present illness:  Per Dr. Laqueta Carina Monique [name] is a 41 y.o. female with medical history significant for depression, anxiety, polysubstance abuse, chronic pain, and history of osteomyelitis involving the right lower leg status post BKA, now presented to the emergency department with many complaints, primarily pain, swelling, and redness involving the BKA stump site. Patient is only intermittently cooperative and difficult to get a history from, but relates that she has had intermittent pain, swelling, and erythema from the right BKA stump for at least a month, reported some purulent drainage early in the course, but none recently. She denied any associated fevers or chills, but noted a nonspecific malaise that has developed over the past couple days. She has not attempted any interventions for this prior to coming in. She had endorsed some chest pain in triage, but denied that currently.  ED Course: Upon arrival to the ED, patient is found to be afebrile, saturating well on room air, slightly tachycardic, and with vitals otherwise stable. EKG features a sinus tachycardia with rate 103. Chest x-ray is negative for acute cardiopulmonary disease. Chemistry panel is notable for a sodium of 134 and potassium of 3.4.  CBC features a leukocytosis to 12,500. Troponin is undetectable. Radiographs of the right BKA stump demonstrate a mild irregularity and ostiolysis at the stump with osteomyelitis not excluded. Blood cultures were obtained, patient was treated with Advil and lidocaine patch, and orthopedic surgery was consulted by the ED physician. Surgeon advised for medical admission and indicated that they will see the patient in consultation, likely for bone biopsy.  Hospital Course:  Right BKA Cellulitis ?underlying osteomyelitis  - Patient presented with right BKA stump erythema, tenderness, leukocytosis, x-ray with bony irregularity, cannot exclude osteomyelitis. - ESR CRP normal - Orthopedics consulted, seen by Dr. Sharol Given, recommended revision of transtibial amputation, which was done 5/16. - Patient seen in consultation by ID who recommended 10 day course of Keflex 500 mg by mouth 4 times a day to treat cellulitis. It was felt per ID that infected bone had been removed and no need for IV antibiotics. - - Patient will be discharged home on 8 more days of oral Keflex to complete a ten-day course of antibiotic treatment.  - Outpatient follow-up with orthopedics in 1 week.  - Skilled nursing facility was recommended to patient however patient was insistent on being discharged home and did not want to go to the skilled nursing facility. Patient will subsequently be discharged home.   Active problems  Polysubstance abuse  - patient has a history of cocaine, opiate abuse, IVDA  Urine drug screen positive for cocaine - Social work consultation requested   Hyponatremia  - Mild, likely secondary to hypovolemia  - Resolved with hydration.   Chronic pain  - Pt complaints  of pain at stump site  - Patient was maintained on Percocet and Flexeril, IV dilaudid prn.  Depression, anxiety  - Patient was maintained on home regimen of Wellbutrin and prn Xanax   Hyperglycemia Hemoglobin A1c 5.5. Diet was  changed patient currently on a regular diet.   GERD - Patient was maintained on a PPI and Tums throughout the hospitalization.   Procedures:  Chest x-ray 07/08/2016  Plain films right knee 07/08/2016 Revision Right Below Knee Amputation, Fibula Application of wound VAC.--Dr Sharol Given 07/11/2016  Consultations:  Orthopedics: Dr Sharol Given 07/09/2016  ID: Dr. Baxter Flattery 07/11/2016  Discharge Exam: Vitals:   07/13/16 0354 07/13/16 1500  BP: (!) 98/48 (!) 117/54  Pulse: 82 93  Resp:  14  Temp: 98.8 F (37.1 C) 98.6 F (37 C)    General: NAD Cardiovascular: RRR Respiratory: CTAB  Discharge Instructions   Discharge Instructions    Diet general    Complete by:  As directed    Increase activity slowly    Complete by:  As directed    Negative Pressure Wound Therapy - Incisional    Complete by:  As directed    Discharge with the portable Prevena wound VAC. When the wound VAC alarms and stops working discontinue the wound VAC and continue the stump shrinker.     Current Discharge Medication List    START taking these medications   Details  cephALEXin (KEFLEX) 500 MG capsule Take 1 capsule (500 mg total) by mouth every 6 (six) hours. Qty: 32 capsule, Refills: 0    docusate sodium (COLACE) 100 MG capsule Take 1 capsule (100 mg total) by mouth 2 (two) times daily. Qty: 10 capsule, Refills: 0    lidocaine (LIDODERM) 5 % Place 1 patch onto the skin daily. Remove & Discard patch within 12 hours or as directed by MD Qty: 7 patch, Refills: 0    pantoprazole (PROTONIX) 40 MG tablet Take 1 tablet (40 mg total) by mouth daily at 6 (six) AM. Qty: 30 tablet, Refills: 3    polyethylene glycol (MIRALAX / GLYCOLAX) packet Take 17 g by mouth daily as needed for mild constipation. Qty: 14 each, Refills: 0      CONTINUE these medications which have CHANGED   Details  oxyCODONE-acetaminophen (PERCOCET) 10-325 MG tablet Take 1 tablet by mouth every 8 (eight) hours as needed for pain. Qty: 15  tablet, Refills: 0      CONTINUE these medications which have NOT CHANGED   Details  ALPRAZolam (XANAX) 0.5 MG tablet Take 1 tablet (0.5 mg total) by mouth 2 (two) times daily as needed for anxiety. Qty: 60 tablet, Refills: 0    buPROPion (WELLBUTRIN SR) 100 MG 12 hr tablet Take 1 tablet (100 mg total) by mouth 2 (two) times daily. Qty: 90 tablet, Refills: 0    cyclobenzaprine (FLEXERIL) 10 MG tablet Take 1 tablet (10 mg total) by mouth 3 (three) times daily as needed for muscle spasms. Qty: 30 tablet, Refills: 0    ferrous sulfate 325 (65 FE) MG tablet Take 1 tablet (325 mg total) by mouth daily with breakfast. Qty: 90 tablet, Refills: 1    gabapentin (NEURONTIN) 300 MG capsule Take 1 capsule (300 mg total) by mouth 3 (three) times daily. Qty: 90 capsule, Refills: 1    methocarbamol (ROBAXIN) 500 MG tablet Take 1 tablet (500 mg total) by mouth every 6 (six) hours as needed for muscle spasms. Qty: 30 tablet, Refills: 0    senna-docusate (SENOKOT-S) 8.6-50 MG tablet Take  1 tablet by mouth at bedtime as needed for mild constipation. Qty: 60 tablet, Refills: 0      STOP taking these medications     oxyCODONE (OXYCONTIN) 20 mg 12 hr tablet      Oxycodone HCl 10 MG TABS        Allergies  Allergen Reactions  . No Known Allergies    Follow-up Information    Newt Minion, MD Follow up in 1 week(s).   Specialty:  Orthopedic Surgery Contact information: [place] [address] [phone]        PCP. Schedule an appointment as soon as possible for a visit in 2 week(s).            The results of significant diagnostics from this hospitalization (including imaging, microbiology, ancillary and laboratory) are listed below for reference.    Significant Diagnostic Studies: Dg Chest 2 View  Result Date: 07/08/2016 CLINICAL DATA:  Chest pain. EXAM: CHEST  2 VIEW COMPARISON:  March 15, 2015 FINDINGS: The heart size and mediastinal contours are  within normal limits. Both lungs are clear. The visualized skeletal structures are unremarkable. IMPRESSION: No active cardiopulmonary disease. Electronically Signed   By: Dorise Bullion III M.D   On: 07/08/2016 16:20   Dg Knee 2 Views Right  Result Date: 07/08/2016 CLINICAL DATA:  Chest pain. EXAM: RIGHT KNEE - 1-2 VIEW COMPARISON:  None. FINDINGS: The patient is status post a below-the-knee amputation. No soft tissue gas is identified. No fractures or dislocations. The distal stump of the fibula has changed in configuration since the March 15, 2015 study. IMPRESSION: The patient is status post amputation. No soft tissue gas identified. The distal stump of the remaining fibula has changed in configuration since the immediate post amputation images March 15, 2015. There is mild irregularity and bony osteolysis in this region. Osteomyelitis not excluded. Electronically Signed   By: Dorise Bullion III M.D   On: 07/08/2016 16:24    Microbiology: Recent Results (from the past 240 hour(s))  Blood culture (routine x 2)     Status: None   Collection Time: 07/08/16  7:37 PM  Result Value Ref Range Status   Specimen Description BLOOD LEFT ARM  Final   Special Requests   Final    BOTTLES DRAWN AEROBIC AND ANAEROBIC Blood Culture adequate volume   Culture NO GROWTH 5 DAYS  Final   Report Status 07/13/2016 FINAL  Final  Blood culture (routine x 2)     Status: Abnormal   Collection Time: 07/08/16  7:42 PM  Result Value Ref Range Status   Specimen Description BLOOD LEFT FOREARM  Final   Special Requests IN PEDIATRIC BOTTLE Blood Culture adequate volume  Final   Culture  Setup Time   Final    GRAM POSITIVE COCCI IN CLUSTERS IN PEDIATRIC BOTTLE Organism ID to follow CRITICAL RESULT CALLED TO, READ BACK BY AND VERIFIED WITH: L. POWELL, PHARMD, 07/09/16 AT 1626 BY J FUDESCO    Culture (A)  Final    STAPHYLOCOCCUS SPECIES (COAGULASE NEGATIVE) THE SIGNIFICANCE OF ISOLATING THIS ORGANISM FROM A SINGLE  SET OF BLOOD CULTURES WHEN MULTIPLE SETS ARE DRAWN IS UNCERTAIN. PLEASE NOTIFY THE MICROBIOLOGY DEPARTMENT WITHIN ONE WEEK IF SPECIATION AND SENSITIVITIES ARE REQUIRED.    Report Status 07/10/2016 FINAL  Final  Blood Culture ID Panel (Reflexed)     Status: Abnormal   Collection Time: 07/08/16  7:42 PM  Result Value Ref Range Status   Enterococcus species NOT DETECTED  NOT DETECTED Final   Listeria monocytogenes NOT DETECTED NOT DETECTED Final   Staphylococcus species DETECTED (A) NOT DETECTED Final    Comment: Methicillin (oxacillin) susceptible coagulase negative staphylococcus. Possible blood culture contaminant (unless isolated from more than one blood culture draw or clinical case suggests pathogenicity). No antibiotic treatment is indicated for blood  culture contaminants. CRITICAL RESULT CALLED TO, READ BACK BY AND VERIFIED WITH: L.POWELL, PHARMD, 07/09/16 AT 1626 BY J FUDESCO    Staphylococcus aureus NOT DETECTED NOT DETECTED Final   Methicillin resistance NOT DETECTED NOT DETECTED Final   Streptococcus species NOT DETECTED NOT DETECTED Final   Streptococcus agalactiae NOT DETECTED NOT DETECTED Final   Streptococcus pneumoniae NOT DETECTED NOT DETECTED Final   Streptococcus pyogenes NOT DETECTED NOT DETECTED Final   Acinetobacter baumannii NOT DETECTED NOT DETECTED Final   Enterobacteriaceae species NOT DETECTED NOT DETECTED Final   Enterobacter cloacae complex NOT DETECTED NOT DETECTED Final   Escherichia coli NOT DETECTED NOT DETECTED Final   Klebsiella oxytoca NOT DETECTED NOT DETECTED Final   Klebsiella pneumoniae NOT DETECTED NOT DETECTED Final   Proteus species NOT DETECTED NOT DETECTED Final   Serratia marcescens NOT DETECTED NOT DETECTED Final   Haemophilus influenzae NOT DETECTED NOT DETECTED Final   Neisseria meningitidis NOT DETECTED NOT DETECTED Final   Pseudomonas aeruginosa NOT DETECTED NOT DETECTED Final   Candida albicans NOT DETECTED NOT DETECTED Final   Candida  glabrata NOT DETECTED NOT DETECTED Final   Candida krusei NOT DETECTED NOT DETECTED Final   Candida parapsilosis NOT DETECTED NOT DETECTED Final   Candida tropicalis NOT DETECTED NOT DETECTED Final  MRSA PCR Screening     Status: Abnormal   Collection Time: 07/09/16  4:26 AM  Result Value Ref Range Status   MRSA by PCR POSITIVE (A) NEGATIVE Final    Comment:        The GeneXpert MRSA Assay (FDA approved for NASAL specimens only), is one component of a comprehensive MRSA colonization surveillance program. It is not intended to diagnose MRSA infection nor to guide or monitor treatment for MRSA infections. RESULT CALLED TO, READ BACK BY AND VERIFIED WITH: Adela Ports RN 9:45 07/09/16 ([email or web address])      Labs: Basic Metabolic Panel:  Recent Labs Lab 07/08/16 1640 07/08/16 1649 07/09/16 0515 07/10/16 0552 07/11/16 0528 07/12/16 0530  NA 134* 136 137 136 136 133*  K 3.4* 3.5 3.9 4.3 4.6 4.3  CL 101 100* 104 105 101 98*  CO2 23  --  '25 26 28 28  ' GLUCOSE 145* 148* 116* 157* 116* 114*  BUN 17 21* 23* '14 12 12  ' CREATININE 0.85 0.70 0.85 0.79 0.69 0.78  CALCIUM 9.1  --  8.3* 8.8* 8.9 8.8*  MG  --   --  2.1  --   --   --    Liver Function Tests: No results for input(s): AST, ALT, ALKPHOS, BILITOT, PROT, ALBUMIN in the last 168 hours. No results for input(s): LIPASE, AMYLASE in the last 168 hours. No results for input(s): AMMONIA in the last 168 hours. CBC:  Recent Labs Lab 07/08/16 1640 07/08/16 1649 07/09/16 0515 07/10/16 0552 07/11/16 0528 07/12/16 0530  WBC 12.5*  --  6.3 5.6 4.7 6.0  NEUTROABS 8.6*  --   --   --   --   --   HGB 12.7 13.9 11.2* 10.6* 10.3* 10.6*  HCT 40.6 41.0 36.4 36.5 35.7* 36.1  MCV 80.1  --  81.6 83.1 84.4 83.4  PLT 335  --  295 280 285 304   Cardiac Enzymes: No results for input(s): CKTOTAL, CKMB, CKMBINDEX, TROPONINI in the last 168 hours. BNP: BNP (last 3 results) No results for input(s): BNP in the last 8760 hours.  ProBNP (last 3  results) No results for input(s): PROBNP in the last 8760 hours.  CBG: No results for input(s): GLUCAP in the last 168 hours.     SignedIrine Seal MD.  [facility] 07/13/2016, 5:02 PM

## 2016-07-13 NOTE — Progress Notes (Signed)
Patient discharge instructions reviewed with patient. Patient verbalizes understanding. RN called ED social worker for cab voucher to transport patient home. Patient belongings with patient. Patient is not in distress. Patient discharged via wheelchair.

## 2016-07-13 NOTE — Care Management (Signed)
Case manager spoke with patient at bedside concerning discharge plan. Patient says she would rather go home than to SNF. States with wheelchair she believes she will be ok. CM discussed VAC and that she not disturbed the dressing. Patient says she has some assistance at discharge. CM spoke with Dr. Janee Mornhompson and Elease HashimotoPatricia, SW.

## 2016-07-13 NOTE — Progress Notes (Signed)
Physical Therapy Treatment Patient Details Name: Monique [name] MRN: [id number] DOB: 08/19/1975 Today's Date: 07/13/2016    History of Present Illness  Monique [name] is a  41 y.o. female with a diagnosis of Osteomeyelitis Right Below Knee Amputation who failed conservative measures.  Pt needed right BKA revision with placement of VAC. PMH:  anxiety, depression, polysubstance abuse, cocaine abuse, chronic pain    PT Comments    Pt admitted with above diagnosis. Pt currently with functional limitations due to the deficits listed below (see PT Problem List). Pt was able to stand to RW with min assist x 2.  Could not stand longer than 25 seconds at  A time.  Pt fatigues quickly.  Poor safety awareness overall.  Feel that SNF would benefit pt.  Pt agrees during this session.  Pt will benefit from skilled PT to increase their independence and safety with mobility to allow discharge to the venue listed below.     Follow Up Recommendations  SNF;Supervision/Assistance - 24 hour     Equipment Recommendations  Wheelchair (measurements PT);Wheelchair cushion (measurements PT)    Recommendations for Other Services       Precautions / Restrictions Precautions Precautions: Fall Restrictions Weight Bearing Restrictions: Yes RLE Weight Bearing: Non weight bearing (right leg)    Mobility  Bed Mobility Overal bed mobility: Independent                Transfers Overall transfer level: Needs assistance Equipment used: Rolling walker (2 wheeled) Transfers: Sit to/from [employer] CorporationStand;Stand Pivot Transfers Sit to Stand: Min assist         General transfer comment: Pt needed cues for hand placement.  Pt also needed assist to power up.  Once up, pt stood for 20 seconds and then 25 seconds. Fatigued quickly.   Ambulation/Gait             General Gait Details: unable.   Stairs            Wheelchair Mobility    Modified Rankin (Stroke Patients Only)       Balance  Overall balance assessment: Needs assistance;History of Falls Sitting-balance support: No upper extremity supported;Feet supported Sitting balance-Leahy Scale: Good     Standing balance support: During functional activity;Bilateral upper extremity supported Standing balance-Leahy Scale: Poor Standing balance comment: Pt relied on bil UE support.                             Cognition Arousal/Alertness: Awake/alert Behavior During Therapy: Flat affect;Impulsive Overall Cognitive Status: History of cognitive impairments - at baseline                                 General Comments: Poor safety overall.       Exercises General Exercises - Lower Extremity Quad Sets: AROM;Both;10 reps;Supine Long Arc Quad: AROM;Both;10 reps;Seated Straight Leg Raises: AROM;Both;10 reps;Supine    General Comments General comments (skin integrity, edema, etc.): VAc in place on right residual limb.       Pertinent Vitals/Pain Pain Assessment: Faces Faces Pain Scale: Hurts even more Pain Location: right BKA Pain Descriptors / Indicators: Aching;Grimacing;Guarding;Operative site guarding Pain Intervention(s): Limited activity within patient's tolerance;Monitored during session;Premedicated before session;Repositioned    Home Living                      Prior Function  PT Goals (current goals can now be found in the care plan section) Progress towards PT goals: Progressing toward goals    Frequency    Min 3X/week      PT Plan Current plan remains appropriate    Co-evaluation              AM-PAC PT "6 Clicks" Daily Activity  Outcome Measure  Difficulty turning over in bed (including adjusting bedclothes, sheets and blankets)?: None Difficulty moving from lying on back to sitting on the side of the bed? : None Difficulty sitting down on and standing up from a chair with arms (e.g., wheelchair, bedside commode, etc,.)?: A Little Help  needed moving to and from a bed to chair (including a wheelchair)?: A Lot Help needed walking in hospital room?: Total Help needed climbing 3-5 steps with a railing? : Total 6 Click Score: 15    End of Session Equipment Utilized During Treatment: Gait belt Activity Tolerance: Patient limited by fatigue;Patient limited by pain Patient left: with call bell/phone within reach;in bed Nurse Communication: Mobility status PT Visit Diagnosis: Unsteadiness on feet (R26.81);Muscle weakness (generalized) (M62.81);Pain Pain - Right/Left: Right Pain - part of body: Knee     Time: 1152-1205 PT Time Calculation (min) (ACUTE ONLY): 13 min  Charges:  $Therapeutic Activity: 8-22 mins                    G Codes:       Cariann Kinnamon,PT Acute Rehabilitation [phone] (pager)    Berline Lopes 07/13/2016, 1:36 PM

## 2016-07-13 NOTE — Progress Notes (Signed)
Pt  requesting RN orienting to" sneak" her some dilaudid into her IV.  RN explained that that was not possible. Pt states "oh, I'm just kidding"  ..."oh, wait, no I'm not kidding"  Again, RN declined her request telling pt that her orders were no longer valid upon d/c orders.

## 2016-07-13 NOTE — Progress Notes (Signed)
Subjective: 2 Days Post-Op Procedure(s) (LRB): Revision Right Below Knee Amputation, Fibula (Right)  Patient is sitting up in bed this morning conversant. No concerns of pain voiced this morning. She [place].  Objective:   Body mass index is 33.47 kg/m.  VITALS:  Temp:  [98.3 F (36.8 C)-99.1 F (37.3 C)] 98.8 F (37.1 C) (05/18 0354) Pulse Rate:  [82-90] 82 (05/18 0354) Resp:  [16] 16 (05/17 1417) BP: (98-116)/(48-61) 98/48 (05/18 0354) SpO2:  [90 %-94 %] 94 % (05/18 0354)   Physical Exam  Constitutional: She is oriented to person, place, and time and well-developed, well-nourished, and in no distress.  Pulmonary/Chest: Effort normal.  Musculoskeletal:  Wound VAC in place. There is a good seal to fit. There is scant drainage in canister.  Neurological: She is alert and oriented to person, place, and time.  Psychiatric: Affect normal.  Nursing note reviewed.    LABS  Recent Labs  07/11/16 0528 07/12/16 0530  HGB 10.3* 10.6*  WBC 4.7 6.0  PLT 285 304    Recent Labs  07/11/16 0528 07/12/16 0530  NA 136 133*  K 4.6 4.3  CL 101 98*  CO2 28 28  BUN 12 12  CREATININE 0.69 0.78  GLUCOSE 116* 114*   No results for input(s): LABPT, INR in the last 72 hours.   Assessment/Plan:  Okay for discharge today from an orthopedic standpoint. Have recommended skilled nursing placement as long as patient is agreeable.   Author: Barnie DelErin Zamora, FNP 07/13/2016 9:32 AM

## 2016-07-13 NOTE — Progress Notes (Signed)
CSW provided pt with taxi voucher transportation home.  Pollyann SavoyJody Monterius Rolf, LCSW Evening/ED Coverage [phone]

## 2016-07-13 NOTE — Social Work (Signed)
CSW met with patient about placement for SNF. CSW discussed insurance limitations as patient has Medicaid. CSW discussed acceptance by [facility] SNF. CSW provided patient SNF list with ratings to review SNF more thoroughly. Patient indicated that she wld contact facility as she does not want to give up her monthly income. CSW importance of making decision timely to secure a bed. Patient understood and will f/u. Patient indicated that she has experience with SNF's in the past and does not want to give up her monthly income.   Discussed with RNCM patient's desires to go home with [facility]. Patient not able to get [facility] due to current injury. Patient will be best suited for SNF to address impairment.

## 2016-07-16 ENCOUNTER — Telehealth (INDEPENDENT_AMBULATORY_CARE_PROVIDER_SITE_OTHER): Payer: Self-pay

## 2016-07-16 NOTE — Telephone Encounter (Signed)
Patient called Triage Phone and states she has excessive Bleeding. I offered her to come in for a 1:45pm appt and she states she cannot come due to transportation issues. She states she would go to the hospital instead. She states transportation needed 3 days in advanced to be notified to get transportation. She said the only day she would be able to come would be on Thursday. Spoke to Dr Lajoyce Cornersuda and assistants and advised her to dial 911 and to let them [facility]. She understand and will call. Monique [name] at the end spoke to patient as well. Advised patient to keep us updated on what they say at the hospital

## 2016-07-20 ENCOUNTER — Ambulatory Visit (INDEPENDENT_AMBULATORY_CARE_PROVIDER_SITE_OTHER): Payer: Medicaid Other | Admitting: Family

## 2016-07-20 ENCOUNTER — Emergency Department (HOSPITAL_COMMUNITY)
Admission: EM | Admit: 2016-07-20 | Discharge: 2016-07-21 | Disposition: A | Discharge status: Home / Self Care | Payer: Medicare Other | Attending: Emergency Medicine | Admitting: Emergency Medicine

## 2016-07-20 ENCOUNTER — Encounter (HOSPITAL_COMMUNITY): Payer: Self-pay

## 2016-07-20 ENCOUNTER — Emergency Department (HOSPITAL_COMMUNITY): Payer: Medicare Other

## 2016-07-20 DIAGNOSIS — T8740 Infection of amputation stump, unspecified extremity: Secondary | ICD-10-CM

## 2016-07-20 DIAGNOSIS — Z79899 Other long term (current) drug therapy: Secondary | ICD-10-CM | POA: Insufficient documentation

## 2016-07-20 DIAGNOSIS — Z8614 Personal history of Methicillin resistant Staphylococcus aureus infection: Secondary | ICD-10-CM | POA: Diagnosis not present

## 2016-07-20 DIAGNOSIS — R781 Finding of opiate drug in blood: Secondary | ICD-10-CM | POA: Diagnosis not present

## 2016-07-20 DIAGNOSIS — T50901A Poisoning by unspecified drugs, medicaments and biological substances, accidental (unintentional), initial encounter: Secondary | ICD-10-CM

## 2016-07-20 DIAGNOSIS — T40604A Poisoning by unspecified narcotics, undetermined, initial encounter: Secondary | ICD-10-CM | POA: Insufficient documentation

## 2016-07-20 DIAGNOSIS — J45909 Unspecified asthma, uncomplicated: Secondary | ICD-10-CM | POA: Diagnosis not present

## 2016-07-20 DIAGNOSIS — R739 Hyperglycemia, unspecified: Secondary | ICD-10-CM | POA: Insufficient documentation

## 2016-07-20 DIAGNOSIS — F319 Bipolar disorder, unspecified: Secondary | ICD-10-CM | POA: Diagnosis not present

## 2016-07-20 DIAGNOSIS — F419 Anxiety disorder, unspecified: Secondary | ICD-10-CM | POA: Insufficient documentation

## 2016-07-20 DIAGNOSIS — R4182 Altered mental status, unspecified: Secondary | ICD-10-CM | POA: Diagnosis present

## 2016-07-20 LAB — COMPREHENSIVE METABOLIC PANEL
ALBUMIN: 4.1 g/dL (ref 3.5–5.0)
ALT: 47 U/L (ref 14–54)
ANION GAP: 9 (ref 5–15)
AST: 52 U/L — AB (ref 15–41)
Alkaline Phosphatase: 59 U/L (ref 38–126)
BUN: 19 mg/dL (ref 6–20)
CHLORIDE: 111 mmol/L (ref 101–111)
CO2: 22 mmol/L (ref 22–32)
Calcium: 9.2 mg/dL (ref 8.9–10.3)
Creatinine, Ser: 0.98 mg/dL (ref 0.44–1.00)
GFR calc Af Amer: >60 mL/min (ref >60)
Glucose, Bld: 208 mg/dL — ABNORMAL HIGH (ref 65–99)
POTASSIUM: 3.8 mmol/L (ref 3.5–5.1)
Sodium: 142 mmol/L (ref 135–145)
Total Bilirubin: 0.2 mg/dL — ABNORMAL LOW (ref 0.3–1.2)
Total Protein: 8.1 g/dL (ref 6.5–8.1)

## 2016-07-20 LAB — SALICYLATE LEVEL: Salicylate Lvl: <7 mg/dL (ref 2.8–30.0)

## 2016-07-20 LAB — CBG MONITORING, ED: Glucose-Capillary: 296 mg/dL — ABNORMAL HIGH (ref 65–99)

## 2016-07-20 LAB — RAPID URINE DRUG SCREEN, HOSP PERFORMED
AMPHETAMINES: NOT DETECTED
Barbiturates: NOT DETECTED
Benzodiazepines: POSITIVE — AB
COCAINE: POSITIVE — AB
OPIATES: POSITIVE — AB
Tetrahydrocannabinol: NOT DETECTED

## 2016-07-20 LAB — CBC
HCT: 35.8 % — ABNORMAL LOW (ref 36.0–46.0)
Hemoglobin: 11.2 g/dL — ABNORMAL LOW (ref 12.0–15.0)
MCH: 25.3 pg — ABNORMAL LOW (ref 26.0–34.0)
MCHC: 31.3 g/dL (ref 30.0–36.0)
MCV: 81 fL (ref 78.0–100.0)
PLATELETS: 550 10*3/uL — AB (ref 150–400)
RBC: 4.42 MIL/uL (ref 3.87–5.11)
RDW: 16.9 % — AB (ref 11.5–15.5)
WBC: 20.6 10*3/uL — AB (ref 4.0–10.5)

## 2016-07-20 LAB — ETHANOL

## 2016-07-20 LAB — CK: Total CK: 37 U/L — ABNORMAL LOW (ref 38–234)

## 2016-07-20 LAB — ACETAMINOPHEN LEVEL: Acetaminophen (Tylenol), Serum: <10 ug/mL — ABNORMAL LOW (ref 10–30)

## 2016-07-20 MED ORDER — OXYCODONE-ACETAMINOPHEN 10-325 MG PO TABS: 1.0000 | ORAL_TABLET | Freq: Three times a day (TID) | ORAL | 0 refills | Status: DC | PRN

## 2016-07-20 NOTE — ED Triage Notes (Signed)
Pt brought in by EMS after a possible overdose  Pt was sitting in bed and about a half hour later her friend found her on the floor with decreased respirations  When EMS arrived pt had agonal respirations  Pupils were equal and reactive  Pt has hx of ETOH abuse and smells of alcohol  Found on scene was a small tin with crushed up pills and a straw in it  Pt has a script for Xanax 1mg  and a baggie was found in the pts bra with them in it  An IV was started and pt was given 1mg  of Narcan  Pt immediately woke up and became combative and agitated  Pt denies any drug use to EMS

## 2016-07-20 NOTE — ED Notes (Signed)
Pt repeatedly asking same questions over and over again. "Who called the ambulance?, where is [place]?, Where is my stuff?, what is going on?"

## 2016-07-20 NOTE — Progress Notes (Signed)
Post-Op Visit Note   Patient: Monique [name]           Date of Birth: 03/27/1975           MRN: [id number] Visit Date: 07/20/2016 PCP: Patient, [name]  Chief Complaint: No chief complaint on file.   HPI:  The patient is a 41 year old woman who presents today 9 days status post revision of her right below the knee amputation. The wound VAC dressing is in place. She has removed the VAC itself as well as the tubing. Today is ambulating with a walker, hopping on her left lower extremity.    Ortho Exam Incision is approximated with sutures there is no gaping there is moderate serosanguineous drainage. There is moderate swelling. There is some maceration. There is no cellulitis or sign of infection.  Visit Diagnoses:  1. 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 Apply dry dressings. Follow up in 2 weeks for suture removal.  Follow-Up Instructions: Return in about 2 weeks (around 08/03/2016).   Imaging: No results found.  Orders:  No orders of the defined types were placed in this encounter.  Meds ordered this encounter  Medications  . oxyCODONE-acetaminophen (PERCOCET) 10-325 MG tablet    Sig: Take 1 tablet by mouth every 8 (eight) hours as needed for pain.    Dispense:  21 tablet    Refill:  0     PMFS History: Patient Active Problem List   Diagnosis Date Noted  . History of osteomyelitis   . Depression with anxiety   . Hypokalemia 07/08/2016  . Hyponatremia 07/08/2016  . 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 Polysubstance abuse 03/15/2015  . Phantom pain (HCC) 03/15/2015  . Elevated lactic acid level 03/15/2015  . BKA stump complication (HCC)   . Fever   . Osteomyelitis (HCC) 03/02/2015  . Cellulitis 03/02/2015  . Cellulitis and abscess 12/17/2014  . Nicotine abuse 12/17/2014  . Anemia, chronic disease 12/17/2014  . Conjunctivitis, left eye   . Wound infection after surgery  09/29/2014  . Irritation of left eye 09/29/2014  . Wound, surgical, infected 09/29/2014  . Acute encephalopathy 08/29/2014  . Drug abuse 08/29/2014  . Cocaine abuse 08/29/2014  . Acute respiratory failure with hypoxia (HCC) 08/29/2014   Past Medical History:  Diagnosis Date  . Anemia   . Anxiety   . Arthritis   . Asthma    PHM  . Bipolar disorder (HCC)   . Chronic back pain    "middle and lower" (12/17/2014)  . Depression   . GERD (gastroesophageal reflux disease)   . H/O blood clots   . Infection right ankle  . Migraine    "weekly" (12/17/2014)  . MRSA infection   . Nerve damage of right foot   . Osteomyelitis of ankle (HCC)    right    Family History  Problem Relation Age of Onset  . Lung cancer Mother   . Cancer Maternal Uncle   . Cancer Maternal Grandfather   . Cancer Paternal Grandfather     Past Surgical History:  Procedure Laterality Date  . AMPUTATION Right 03/04/2015   Procedure: Right Below Knee Amputation;  Surgeon: Nadara MustardMarcus Duda V, MD;  [facility];  Service: Orthopedics;  Laterality: Right;  . ANKLE FRACTURE SURGERY Right 82016  . ANKLE FUSION Right 01/19/2015   Procedure: Right Tibiotalar Fusion, Place Antibiotic Beads;  Surgeon: Nadara MustardMarcus Duda V, MD;  Location: [facility] OR;  Service: Orthopedics;  Laterality: Right;  . DILATION AND CURETTAGE OF UTERUS  X 2  . EXTERNAL FIXATION LEG Right 01/19/2015   Procedure: EXTERNAL FIXATION Right Ankle;  Surgeon: [name], MD;  Location[facility] OR;  Service: Orthopedics;  Laterality: Right;  . EXTERNAL FIXATION REMOVAL Right 03/04/2015   Procedure: REMOVAL EXTERNAL FIXATION Right Lower Leg;  Surgeon: [name], MD;  Location: [facility] OR;  Service: Orthopedics;  Laterality: Right;  . FRACTURE SURGERY    . HARDWARE REMOVAL Right 09/02/2014   Procedure: Removal Right Lateral Ankle Hardware, Place Antibiotic Bead ;  Surgeon: [name], MD;  Location: [facility] ORS;  Service: Orthopedics;  Laterality: Right;  . INCISION AND DRAINAGE  ABSCESS Right 12/19/2014   Procedure: INCISION AND DRAINAGE OF ABSCESS ON RIGHT SIDE OF CHIN;  Surgeon: [name], MD;  Location: [facility] OR;  Service: ENT;  Laterality: Right;  . STUMP REVISION Right 04/14/2015   Procedure: RIGHT BELOW KNEE AMPUTATION REVISION;  Surgeon: [name], MD;  Location: [facility] OR;  Service: Orthopedics;  Laterality: Right;  . STUMP REVISION Right 07/11/2016   Procedure: Revision Right Below Knee Amputation, Fibula;  Surgeon: [name], MD;  Location: [facility] OR;  Service: Orthopedics;  Laterality: Right;  . TUBAL LIGATION     Social History   Occupational History  . Not on file.   Social History Main Topics  . Smoking status: Current Every Day Smoker    Packs/day: 1.00    Years: 25.00    Types: Cigarettes  . Smokeless tobacco: Never Used  . Alcohol use Yes     Comment:  " 7 months ago " 01/18/15  . Drug use: Yes    Types: Cocaine, Marijuana, Benzodiazepines     Comment: drug screen +  benzos,cocaine, marijuana pt denies using ( denies curent use 12/17/14  . Sexual activity: Yes    Birth control/ protection: None, Surgical

## 2016-07-20 NOTE — ED Notes (Signed)
Pt refusing ABG.

## 2016-07-20 NOTE — ED Notes (Addendum)
EKG given to EDP,Jacubowitz,MD., for review. 

## 2016-07-20 NOTE — ED Provider Notes (Addendum)
[facility]-EMERGENCY DEPT Provider Note   CSN: [id number] Arrival date & time: 07/20/16  2010     History   Chief Complaint Chief Complaint  Patient presents with  . Ingestion    HPI Monique [name] is a 41 y.o. female.Level V caveat altered mental status. History is obtained from nurse's notes. Nursing got history from paramedics. EMS was called and patient was found unresponsive on her floor. Diminished respirations. Patient had reportedly drunk alcohol and was found on the scene with a small pin with crushed up pills and a straw. Patient was treated with Narcan 1 mg intravenously in the field and immediately woke up and became combative. Patient has no recall of event. It does not know how she got here.  HPI  Past Medical History:  Diagnosis Date  . Anemia   . Anxiety   . Arthritis   . Asthma    PHM  . Bipolar disorder (HCC)   . Chronic back pain    "middle and lower" (12/17/2014)  . Depression   . GERD (gastroesophageal reflux disease)   . H/O blood clots   . Infection right ankle  . Migraine    "weekly" (12/17/2014)  . MRSA infection   . Nerve damage of right foot   . Osteomyelitis of ankle [facility])    right    Patient Active Problem List   Diagnosis Date Noted  . History of osteomyelitis   . Depression with anxiety   . Hypokalemia 07/08/2016  . Hyponatremia 07/08/2016  . 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 Polysubstance abuse 03/15/2015  . Phantom pain (HCC) 03/15/2015  . Elevated lactic acid level 03/15/2015  . BKA stump complication (HCC)   . Fever   . Osteomyelitis (HCC) 03/02/2015  . Cellulitis 03/02/2015  . Cellulitis and abscess 12/17/2014  . Nicotine abuse 12/17/2014  . Anemia, chronic disease 12/17/2014  . Conjunctivitis, left eye   . Wound infection after surgery 09/29/2014  . Irritation of left eye 09/29/2014  . Wound, surgical, infected 09/29/2014  . Acute encephalopathy 08/29/2014  . Drug abuse 08/29/2014  .  Cocaine abuse 08/29/2014  . Acute respiratory failure with hypoxia (HCC) 08/29/2014    Past Surgical History:  Procedure Laterality Date  . AMPUTATION Right 03/04/2015   Procedure: Right Below Knee Amputation;  Surgeon: [name], MD;  Location[facility] OR;  Service: Orthopedics;  Laterality: Right;  . ANKLE FRACTURE SURGERY Right 82016  . ANKLE FUSION Right 01/19/2015   Procedure: Right Tibiotalar Fusion, Place Antibiotic Beads;  Surgeon: [name], MD;  Location: [facility] OR;  Service: Orthopedics;  Laterality: Right;  . DILATION AND CURETTAGE OF UTERUS  X 2  . EXTERNAL FIXATION LEG Right 01/19/2015   Procedure: EXTERNAL FIXATION Right Ankle;  Surgeon: [name], MD;  Location: [facility] OR;  Service: Orthopedics;  Laterality: Right;  . EXTERNAL FIXATION REMOVAL Right 03/04/2015   Procedure: REMOVAL EXTERNAL FIXATION Right Lower Leg;  Surgeon: [name], MD;  Location: [facility] OR;  Service: Orthopedics;  Laterality: Right;  . FRACTURE SURGERY    . HARDWARE REMOVAL Right 09/02/2014   Procedure: Removal Right Lateral Ankle Hardware, Place Antibiotic Bead ;  Surgeon: [name], MD;  Location: [facility] ORS;  Service: Orthopedics;  Laterality: Right;  . INCISION AND DRAINAGE ABSCESS Right 12/19/2014   Procedure: INCISION AND DRAINAGE OF ABSCESS ON RIGHT SIDE OF CHIN;  Surgeon: [name], MD;  Location: [facility] OR;  Service: ENT;  Laterality: Right;  .  STUMP REVISION Right 04/14/2015   Procedure: RIGHT BELOW KNEE AMPUTATION REVISION;  Surgeon: Nadara MustardMarcus Duda V, MD;  Location: [facility] OR;  Service: Orthopedics;  Laterality: Right;  . STUMP REVISION Right 07/11/2016   Procedure: Revision Right Below Knee Amputation, Fibula;  Surgeon: Nadara Mustarduda, Marcus V, MD;  [facility];  Service: Orthopedics;  Laterality: Right;  . TUBAL LIGATION      OB History    No data available       Home Medications    Prior to Admission medications   Medication Sig Start Date End Date Taking? Authorizing Provider  ALPRAZolam Prudy Feeler(XANAX) 0.5  MG tablet Take 1 tablet (0.5 mg total) by mouth 2 (two) times daily as needed for anxiety. Patient not taking: Reported on 07/08/2016 01/18/16   Nadara Mustarduda, Marcus V, MD  buPROPion [facility](WELLBUTRIN SR) 100 MG 12 hr tablet Take 1 tablet (100 mg total) by mouth 2 (two) times daily. 03/24/15   Henrietta HooverBernhardt, Linda C, NP  cephALEXin (KEFLEX) 500 MG capsule Take 1 capsule (500 mg total) by mouth every 6 (six) hours. 07/13/16 07/21/16  Rodolph Bonghompson, Daniel V, MD  cyclobenzaprine (FLEXERIL) 10 MG tablet Take 1 tablet (10 mg total) by mouth 3 (three) times daily as needed for muscle spasms. Patient not taking: Reported on 07/08/2016 12/22/15   Adonis HugueninZamora, Erin R, NP  docusate sodium (COLACE) 100 MG capsule Take 1 capsule (100 mg total) by mouth 2 (two) times daily. 07/13/16   Rodolph Bonghompson, Daniel V, MD  ferrous sulfate 325 (65 FE) MG tablet Take 1 tablet (325 mg total) by mouth daily with breakfast. Patient not taking: Reported on 07/08/2016 03/28/15   Henrietta HooverBernhardt, Linda C, NP  gabapentin (NEURONTIN) 300 MG capsule Take 1 capsule (300 mg total) by mouth 3 (three) times daily. Patient not taking: Reported on 07/08/2016 03/24/15   Henrietta HooverBernhardt, Linda C, NP  lidocaine (LIDODERM) 5 % Place 1 patch onto the skin daily. Remove & Discard patch within 12 hours or as directed by MD 07/13/16 07/20/16  Rodolph Bonghompson, Daniel V, MD  methocarbamol (ROBAXIN) 500 MG tablet Take 1 tablet (500 mg total) by mouth every 6 (six) hours as needed for muscle spasms. Patient not taking: Reported on 07/08/2016 03/24/15   Henrietta HooverBernhardt, Linda C, NP  oxyCODONE-acetaminophen (PERCOCET) 10-325 MG tablet Take 1 tablet by mouth every 8 (eight) hours as needed for pain. 07/20/16   Adonis HugueninZamora, Erin R, NP  pantoprazole (PROTONIX) 40 MG tablet Take 1 tablet (40 mg total) by mouth daily at 6 (six) AM. 07/14/16   Rodolph Bonghompson, Daniel V, MD  polyethylene glycol [facility](MIRALAX / Ethelene HalGLYCOLAX) packet Take 17 g by mouth daily as needed for mild constipation. 07/13/16   Rodolph Bonghompson, Daniel V, MD  senna-docusate (SENOKOT-S)  8.6-50 MG tablet Take 1 tablet by mouth at bedtime as needed for mild constipation. Patient not taking: Reported on 07/08/2016 03/06/15   Richarda OverlieAbrol, Nayana, MD    Family History Family History  Problem Relation Age of Onset  . Lung cancer Mother   . Cancer Maternal Uncle   . Cancer Maternal Grandfather   . Cancer Paternal Grandfather     Social History Social History  Substance Use Topics  . Smoking status: Current Every Day Smoker    Packs/day: 1.00    Years: 25.00    Types: Cigarettes  . Smokeless tobacco: Never Used  . Alcohol use Yes     Comment:  " 7 months ago " 01/18/15     Allergies   No known allergies   Review of Systems  Review of Systems  Unable to perform ROS: Mental status change     Physical Exam Updated Vital Signs BP 115/75 (BP Location: Right Arm)   Pulse (!) 104   Temp 97.5 F (36.4 C)   Resp 18   LMP 06/25/2016   SpO2 (!) 87% Comment: RN,Rick made aware of pt. decreased O2.  Physical Exam  Constitutional: She appears well-developed and well-nourished.  HENT:  Head: Normocephalic and atraumatic.  Eyes: Conjunctivae are normal. Pupils are equal, round, and reactive to light.  Neck: Neck supple. No tracheal deviation present. No thyromegaly present.  Cardiovascular: Normal rate and regular rhythm.   No murmur heard. Pulmonary/Chest: Effort normal and breath sounds normal.  Abdominal: Soft. Bowel sounds are normal. She exhibits no distension. There is no tenderness.  Musculoskeletal: Normal range of motion. She exhibits no edema or tenderness.  Right sided BKA with sutured surgical wound at distal stump which is clean-appearing  Neurological: She is alert. Coordination normal.  Oriented to name and hospital does not know month. Nose her birth date but cannot speak her age. Speech is clear.  Skin: Skin is warm and dry. No rash noted.  Psychiatric: She has a normal mood and affect.  Nursing note and vitals reviewed.    ED Treatments / Results    Labs (all labs ordered are listed, but only abnormal results are displayed) Labs Reviewed  CBG MONITORING, ED - Abnormal; Notable for the following:       Result Value   Glucose-Capillary 296 (*)    All other components within normal limits  COMPREHENSIVE METABOLIC PANEL  ETHANOL  SALICYLATE LEVEL  ACETAMINOPHEN LEVEL  CBC  RAPID URINE DRUG SCREEN, HOSP PERFORMED  BLOOD GAS, ARTERIAL    EKG  EKG Interpretation  Date/Time:  Friday Jul 20 2016 20:31:28 EDT Ventricular Rate:  102 PR Interval:    QRS Duration: 82 QT Interval:  345 QTC Calculation: 450 R Axis:   -9 Text Interpretation:  Sinus tachycardia Low voltage, precordial leads Probable anteroseptal infarct, old No significant change since last tracing Confirmed by Doug Sou [phone]) on 07/20/2016 9:14:02 PM      Results for orders placed or performed during the hospital encounter of 07/20/16  Comprehensive metabolic panel  Result Value Ref Range   Sodium 142 135 - 145 mmol/L   Potassium 3.8 3.5 - 5.1 mmol/L   Chloride 111 101 - 111 mmol/L   CO2 22 22 - 32 mmol/L   Glucose, Bld 208 (H) 65 - 99 mg/dL   BUN 19 6 - 20 mg/dL   Creatinine, Ser 6.04 0.44 - 1.00 mg/dL   Calcium 9.2 8.9 - 54.0 mg/dL   Total Protein 8.1 6.5 - 8.1 g/dL   Albumin 4.1 3.5 - 5.0 g/dL   AST 52 (H) 15 - 41 U/L   ALT 47 14 - 54 U/L   Alkaline Phosphatase 59 38 - 126 U/L   Total Bilirubin 0.2 (L) 0.3 - 1.2 mg/dL   GFR calc non Af Amer >60 >60 mL/min   GFR calc Af Amer >60 >60 mL/min   Anion gap 9 5 - 15  Ethanol  Result Value Ref Range   Alcohol, Ethyl (B) <5 <5 mg/dL  Salicylate level  Result Value Ref Range   Salicylate Lvl <7.0 2.8 - 30.0 mg/dL  Acetaminophen level  Result Value Ref Range   Acetaminophen (Tylenol), Serum <10 (L) 10 - 30 ug/mL  cbc  Result Value Ref Range   WBC 20.6 (H) 4.0 -  10.5 K/uL   RBC 4.42 3.87 - 5.11 MIL/uL   Hemoglobin 11.2 (L) 12.0 - 15.0 g/dL   HCT 16.1 (L) 09.6 - 04.5 %   MCV 81.0 78.0 - 100.0 fL    MCH 25.3 (L) 26.0 - 34.0 pg   MCHC 31.3 30.0 - 36.0 g/dL   RDW 40.9 (H) 81.1 - 91.4 %   Platelets 550 (H) 150 - 400 K/uL  Rapid urine drug screen (hospital performed)  Result Value Ref Range   Opiates POSITIVE (A) NONE DETECTED   Cocaine POSITIVE (A) NONE DETECTED   Benzodiazepines POSITIVE (A) NONE DETECTED   Amphetamines NONE DETECTED NONE DETECTED   Tetrahydrocannabinol NONE DETECTED NONE DETECTED   Barbiturates NONE DETECTED NONE DETECTED  CK  Result Value Ref Range   Total CK 37 (L) 38 - 234 U/L  CBG monitoring, ED  Result Value Ref Range   Glucose-Capillary 296 (H) 65 - 99 mg/dL   Comment 1 Notify RN    Comment 2 Document in Chart    Dg Chest 2 View  Result Date: 07/08/2016 CLINICAL DATA:  Chest pain. EXAM: CHEST  2 VIEW COMPARISON:  March 15, 2015 FINDINGS: The heart size and mediastinal contours are within normal limits. Both lungs are clear. The visualized skeletal structures are unremarkable. IMPRESSION: No active cardiopulmonary disease. Electronically Signed   By: Gerome Wynette Jersey III M.D   On: 07/08/2016 16:20   Dg Knee 2 Views Right  Result Date: 07/08/2016 CLINICAL DATA:  Chest pain. EXAM: RIGHT KNEE - 1-2 VIEW COMPARISON:  None. FINDINGS: The patient is status post a below-the-knee amputation. No soft tissue gas is identified. No fractures or dislocations. The distal stump of the fibula has changed in configuration since the March 15, 2015 study. IMPRESSION: The patient is status post amputation. No soft tissue gas identified. The distal stump of the remaining fibula has changed in configuration since the immediate post amputation images March 15, 2015. There is mild irregularity and bony osteolysis in this region. Osteomyelitis not excluded. Electronically Signed   By: Gerome Aayansh Codispoti III M.D   On: 07/08/2016 16:24   Dg Chest Port 1 View  Result Date: 07/20/2016 CLINICAL DATA:  Decreased oxygen saturation EXAM: PORTABLE CHEST 1 VIEW COMPARISON:  07/08/2016  FINDINGS: Cardiac shadow is mildly enlarged but stable. The lungs are well aerated bilaterally. Very mild vascular congestion is noted centrally. No bony abnormality is seen. IMPRESSION: Mild vascular congestion.  No other focal abnormality is noted. Electronically Signed   By: Alcide Clever M.D.   On: 07/20/2016 21:21   Radiology No results found.  Procedures Procedures (including critical care time)  Medications Ordered in ED Medications - No data to display 1:15 AM patient remains disoriented to month. She is awake and alert. Oriented to name and hospital. She admits that she is somewhat confused. She vehemently denies that she wishes to hurt herself  Initial Impression / Assessment and Plan / ED Course  I have reviewed the triage vital signs and the nursing notes.  Pertinent labs & imaging results that were available during my care of the patient were reviewed by me and considered in my medical decision making (see chart for details).     Pt signed out to Dr. I knapp at 125 am  Final Clinical Impressions(s) / ED Diagnoses  Diagnosis #1 drug overdose #2 hyperglycemia Final diagnoses:  None   CRITICAL CARE Performed by: Doug Sou Total critical care time: 30 minutes Critical care time was exclusive of separately  billable procedures and treating other patients. Critical care was necessary to treat or prevent imminent or life-threatening deterioration. Critical care was time spent personally by me on the following activities: development of treatment plan with patient and/or surrogate as well as nursing, discussions with consultants, evaluation of patient's response to treatment, examination of patient, obtaining history from patient or surrogate, ordering and performing treatments and interventions, ordering and review of laboratory studies, ordering and review of radiographic studies, pulse oximetry and re-evaluation of patient's condition. New Prescriptions New Prescriptions     No medications on file     Doug Sou, MD 07/21/16 1610    Doug Sou, MD 07/21/16 [phone]

## 2016-07-20 NOTE — ED Notes (Signed)
Charge RN notified MD of pt refusal

## 2016-07-20 NOTE — Progress Notes (Signed)
Patient is refusing arterial blood gas. RN aware.

## 2016-07-20 NOTE — Progress Notes (Signed)
RT came back to obtain arterial blood gas. Patient refused again. RN aware

## 2016-07-21 DIAGNOSIS — T40604A Poisoning by unspecified narcotics, undetermined, initial encounter: Secondary | ICD-10-CM | POA: Diagnosis not present

## 2016-07-21 MED ORDER — ONDANSETRON 4 MG PO TBDP
4.0000 mg | ORAL_TABLET | Freq: Once | ORAL | Status: AC
  Administered 2016-07-21: 4 mg via ORAL
  Filled 2016-07-21: qty 1

## 2016-07-21 MED ORDER — ONDANSETRON HCL 4 MG PO TABS: 4.0000 mg | ORAL_TABLET | Freq: Three times a day (TID) | ORAL | 0 refills | Status: DC | PRN

## 2016-07-21 NOTE — ED Triage Notes (Signed)
Attempted to contact friend and grandmother, listed on chart to arrange transport home. Left message with friend, no answer with grandmother.

## 2016-07-21 NOTE — ED Provider Notes (Signed)
7:30 AM patient left at change of shift to recover from her overdose with opiates, cocaine, benzodiazepines. Patient is sleeping but easily awakened. She knows she is at the hospital. She does not recall what happened, however she denies purposely trying to hurt herself. She also states she does not have a drug problem, she states she was "just tired". She does not want referral for mental health evaluation or drug counseling.  I talked to patient again at 8 AM. She is got the cover over her mouth. I thought she said her stump was hurting. I unwrapped her stump, her dressing had some bloody serosanguineous drainage on it, no purulent material seen. Her sutures are intact. The suture line appears to be without infection. Nursing staff will rewrap her stump. She then yelled at me and told me it was not her stump that was hurting it was her stomach, when I ask her to show me where her stomach hurting, she states it is not hurting, she is just nauseated. Zofran was ordered.   Pt was discharged home.   Devoria AlbeIva Blu Mcglaun, MD, Concha PyoFACEP    Velera Lansdale, MD 07/21/16 [phone]

## 2016-07-21 NOTE — ED Notes (Signed)
Pt provided with breakfast tray.

## 2016-07-21 NOTE — Discharge Instructions (Signed)
Follow up with Dr Lajoyce Cornersuda for your stump surgery.  STOP DOING COCAINE!!!  Recheck if you feel worse. Use the zofran for nausea as needed.

## 2016-07-21 NOTE — ED Notes (Signed)
Pt reports that she does not have a way to get home. Pt wants a cab and was advised that SW would have to approve. Charge RN made aware

## 2016-07-21 NOTE — ED Notes (Signed)
Called [facility] to transport pt back home d/t the fact pt has no DME to get into house via cab.

## 2016-07-21 NOTE — ED Notes (Signed)
[facility] here to transport pt back home. Pt called friend and confirmed that they will be there to let her in

## 2016-07-26 ENCOUNTER — Telehealth (INDEPENDENT_AMBULATORY_CARE_PROVIDER_SITE_OTHER): Payer: Self-pay | Admitting: Orthopedic Surgery

## 2016-07-26 NOTE — Telephone Encounter (Signed)
Patient called asking for a refill on percocet 10 and also wanted an antibiotic sent into her pharmacy for the infection around her stiches. CB # [phone]

## 2016-07-26 NOTE — Telephone Encounter (Signed)
I called and lm on vm to advise that if she is concerned that her surgical site may be infected and needing abx she needs to be seen. We can see her tomorrow in the office and she should call and make an appt.

## 2016-07-27 ENCOUNTER — Ambulatory Visit (INDEPENDENT_AMBULATORY_CARE_PROVIDER_SITE_OTHER): Payer: Medicaid Other | Admitting: Family

## 2016-07-30 ENCOUNTER — Ambulatory Visit (INDEPENDENT_AMBULATORY_CARE_PROVIDER_SITE_OTHER): Payer: Medicaid Other | Admitting: Orthopedic Surgery

## 2016-07-30 ENCOUNTER — Encounter (INDEPENDENT_AMBULATORY_CARE_PROVIDER_SITE_OTHER): Payer: Self-pay | Admitting: Orthopedic Surgery

## 2016-07-30 VITALS — Ht 64.0 in | Wt 195.0 lb

## 2016-07-30 DIAGNOSIS — T8740 Infection of amputation stump, unspecified extremity: Secondary | ICD-10-CM

## 2016-07-30 MED ORDER — KETOROLAC TROMETHAMINE 10 MG PO TABS: 10.0000 mg | ORAL_TABLET | Freq: Four times a day (QID) | ORAL | 0 refills | Status: DC | PRN

## 2016-07-30 MED ORDER — DOXYCYCLINE HYCLATE 100 MG PO TABS: 100.0000 mg | ORAL_TABLET | Freq: Two times a day (BID) | ORAL | 0 refills | Status: DC

## 2016-07-30 NOTE — Progress Notes (Signed)
Office Visit Note   Patient: Monique [name]           Date of Birth: 1975-05-28           MRN: [id number] Visit Date: 07/30/2016              Requested by: No referring provider defined for this encounter. PCP: Patient, [name]  Chief Complaint  Patient presents with  . Right Leg - Routine Post Op    07/13/16 right BKA revision with vac      HPI: Patient is a 41 year old woman who is status post revision right transtibial amputation about 2-1/2 weeks ago. Patient went to the emergency room about 1 week ago for accidental overdose with opioids cocaine and benzodiazepines. By the EMS report patient was found unresponsive with a straw crushed up white powder and a pen. Patient denies using any type and needles. Patient was revived with Narcan. Patient presents complaining of a painful wound from her transtibial amputation revision. Patient is rubbing the open wound with her hands. There is an area of cellulitis and an open wound.  Assessment & Plan: Visit Diagnoses:  1. 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 Patient will use the Silvadene to wash her leg with soap and water and apply Silvadene to the gauze and packed the wound open with the Silvadene gauze. We will follow up closely in 1 week.  Follow-Up Instructions: Return in about 1 week (around 08/06/2016).   Ortho Exam  Patient is alert, oriented, no adenopathy, well-dressed, normal affect, normal respiratory effort. Examination patient has area of cellulitis about 3 cm in diameter there is no purulent drainage there is clear drainage. Wound is 10 mm in diameter and 10 mm deep. There is good granulation tissue at the base of the wound there is no exposed bone there is no purulence there is no odor. We will harvest the sutures  today.  Imaging: No results found.  Labs: Lab Results  Component Value Date   HGBA1C 5.5 07/09/2016   HGBA1C 5.6 03/02/2015   ESRSEDRATE 6 07/08/2016   ESRSEDRATE 80 (H) 03/02/2015   ESRSEDRATE 88 (H) 02/16/2015   CRP <0.8 07/08/2016   CRP 1.7 (H) 03/02/2015   CRP 2.0 (H) 02/16/2015   REPTSTATUS 07/10/2016 FINAL 07/08/2016   GRAMSTAIN  12/19/2014    NO WBC SEEN NO SQUAMOUS EPITHELIAL CELLS SEEN NO ORGANISMS SEEN Performed at [facility]    GRAMSTAIN  12/19/2014    NO WBC SEEN NO SQUAMOUS EPITHELIAL CELLS SEEN NO ORGANISMS SEEN Performed at [facility]    CULT (A) 07/08/2016    STAPHYLOCOCCUS SPECIES (COAGULASE NEGATIVE) THE SIGNIFICANCE OF ISOLATING THIS ORGANISM FROM A SINGLE SET OF BLOOD CULTURES WHEN MULTIPLE SETS ARE DRAWN IS UNCERTAIN. PLEASE NOTIFY THE MICROBIOLOGY DEPARTMENT WITHIN ONE WEEK IF SPECIATION AND SENSITIVITIES ARE REQUIRED.    LABORGA STAPHYLOCOCCUS AUREUS 12/19/2014    Orders:  No orders of the defined types were placed in this encounter.  Meds ordered this encounter  Medications  . doxycycline (VIBRA-TABS) 100 MG tablet    Sig: Take 1 tablet (100 mg total) by mouth 2 (two) times daily.    Dispense:  60 tablet    Refill:  0  . ketorolac (TORADOL) 10 MG tablet    Sig: Take  1 tablet (10 mg total) by mouth every 6 (six) hours as needed.    Dispense:  20 tablet    Refill:  0     Procedures: No procedures performed  Clinical Data: No additional findings.  ROS:  All other systems negative, except as noted in the HPI. Review of Systems  Objective: Vital Signs: Ht 5\' 4"  (1.626 m)   Wt 195 lb (88.5 kg)   BMI 33.47 kg/m   Specialty Comments:  No specialty comments available.  PMFS History: Patient Active Problem List   Diagnosis Date Noted  . History of osteomyelitis   . Depression with anxiety   . Hypokalemia 07/08/2016  . Hyponatremia 07/08/2016  . 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  Polysubstance abuse 03/15/2015  . Phantom pain (HCC) 03/15/2015  . Elevated lactic acid level 03/15/2015  . BKA stump complication (HCC)   . Fever   . Osteomyelitis (HCC) 03/02/2015  . Cellulitis 03/02/2015  . Cellulitis and abscess 12/17/2014  . Nicotine abuse 12/17/2014  . Anemia, chronic disease 12/17/2014  . Conjunctivitis, left eye   . Wound infection after surgery 09/29/2014  . Irritation of left eye 09/29/2014  . Wound, surgical, infected 09/29/2014  . Acute encephalopathy 08/29/2014  . Drug abuse 08/29/2014  . Cocaine abuse 08/29/2014  . Acute respiratory failure with hypoxia (HCC) 08/29/2014   Past Medical History:  Diagnosis Date  . Anemia   . Anxiety   . Arthritis   . Asthma    PHM  . Bipolar disorder (HCC)   . Chronic back pain    "middle and lower" (12/17/2014)  . Depression   . GERD (gastroesophageal reflux disease)   . H/O blood clots   . Infection right ankle  . Migraine    "weekly" (12/17/2014)  . MRSA infection   . Nerve damage of right foot   . Osteomyelitis of ankle (HCC)    right    Family History  Problem Relation Age of Onset  . Lung cancer Mother   . Cancer Maternal Uncle   . Cancer Maternal Grandfather   . Cancer Paternal Grandfather     Past Surgical History:  Procedure Laterality Date  . AMPUTATION Right 03/04/2015   Procedure: Right Below Knee Amputation;  Surgeon: Nadara MustardMarcus Duda V, MD;  [facility];  Service: Orthopedics;  Laterality: Right;  . ANKLE FRACTURE SURGERY Right 82016  . ANKLE FUSION Right 01/19/2015   Procedure: Right Tibiotalar Fusion, Place Antibiotic Beads;  Surgeon: Nadara MustardMarcus Duda V, MD;  Location: [facility] OR;  Service: Orthopedics;  Laterality: Right;  . DILATION AND CURETTAGE OF UTERUS  X 2  . EXTERNAL FIXATION LEG Right 01/19/2015   Procedure: EXTERNAL FIXATION Right Ankle;  Surgeon: Nadara MustardMarcus Duda V, MD;  [facility];  Service: Orthopedics;  Laterality: Right;  . EXTERNAL FIXATION REMOVAL Right 03/04/2015   Procedure:  REMOVAL EXTERNAL FIXATION Right Lower Leg;  Surgeon: Nadara MustardMarcus Duda V, MD;  Location: [facility] OR;  Service: Orthopedics;  Laterality: Right;  . FRACTURE SURGERY    . HARDWARE REMOVAL Right 09/02/2014   Procedure: Removal Right Lateral Ankle Hardware, Place Antibiotic Bead ;  Surgeon: Nadara MustardMarcus Duda V, MD;  Location: [facility] ORS;  Service: Orthopedics;  Laterality: Right;  . INCISION AND DRAINAGE ABSCESS Right 12/19/2014   Procedure: INCISION AND DRAINAGE OF ABSCESS ON RIGHT SIDE OF CHIN;  Surgeon: Christia Readingwight Bates, MD;  [facility];  Service: ENT;  Laterality: Right;  . STUMP REVISION Right 04/14/2015  Procedure: RIGHT BELOW KNEE AMPUTATION REVISION;  Surgeon: [name], MD;  Location: [facility] OR;  Service: Orthopedics;  Laterality: Right;  . STUMP REVISION Right 07/11/2016   Procedure: Revision Right Below Knee Amputation, Fibula;  Surgeon: [name], MD;  Location: [facility] OR;  Service: Orthopedics;  Laterality: Right;  . TUBAL LIGATION     Social History   Occupational History  . Not on file.   Social History Main Topics  . Smoking status: Current Every Day Smoker    Packs/day: 1.00    Years: 25.00    Types: Cigarettes  . Smokeless tobacco: Never Used  . Alcohol use Yes     Comment:  " 7 months ago " 01/18/15  . Drug use: Yes    Types: Cocaine, Marijuana, Benzodiazepines     Comment: drug screen +  benzos,cocaine, marijuana pt denies using ( denies curent use 12/17/14  . Sexual activity: Yes    Birth control/ protection: None, Surgical

## 2016-07-30 NOTE — Addendum Note (Signed)
Addendum  created 07/30/16 1525 by Val EagleMoser, Shiloh Swopes, MD   Sign clinical note

## 2016-08-02 ENCOUNTER — Telehealth (INDEPENDENT_AMBULATORY_CARE_PROVIDER_SITE_OTHER): Payer: Self-pay

## 2016-08-02 MED ORDER — SILVER SULFADIAZINE 1 % EX CREA: 1.0000 "application " | TOPICAL_CREAM | Freq: Every day | CUTANEOUS | 0 refills | Status: DC

## 2016-08-02 NOTE — Telephone Encounter (Signed)
I called to advise rx for [facility] [address].

## 2016-08-02 NOTE — Telephone Encounter (Signed)
Patient would like to have [facility] for [facility] [address]  CB# is [phone].  Please Advise.  Thank You

## 2016-08-03 ENCOUNTER — Other Ambulatory Visit (INDEPENDENT_AMBULATORY_CARE_PROVIDER_SITE_OTHER): Payer: Self-pay | Admitting: Family

## 2016-08-03 ENCOUNTER — Ambulatory Visit (INDEPENDENT_AMBULATORY_CARE_PROVIDER_SITE_OTHER): Payer: Medicaid Other | Admitting: Family

## 2016-08-03 MED ORDER — MELOXICAM 7.5 MG PO TABS: 7.5000 mg | ORAL_TABLET | Freq: Every day | ORAL | 2 refills | Status: AC

## 2016-08-07 ENCOUNTER — Ambulatory Visit (INDEPENDENT_AMBULATORY_CARE_PROVIDER_SITE_OTHER): Payer: Medicaid Other | Admitting: Orthopedic Surgery

## 2016-08-15 ENCOUNTER — Emergency Department (HOSPITAL_COMMUNITY): Payer: Medicare Other

## 2016-08-15 ENCOUNTER — Encounter (HOSPITAL_COMMUNITY): Payer: Self-pay | Admitting: Emergency Medicine

## 2016-08-15 ENCOUNTER — Emergency Department (HOSPITAL_COMMUNITY)
Admission: EM | Admit: 2016-08-15 | Discharge: 2016-08-15 | Disposition: A | Discharge status: Home / Self Care | Payer: Medicare Other | Attending: Emergency Medicine | Admitting: Emergency Medicine

## 2016-08-15 DIAGNOSIS — Z89511 Acquired absence of right leg below knee: Secondary | ICD-10-CM | POA: Diagnosis not present

## 2016-08-15 DIAGNOSIS — S81801A Unspecified open wound, right lower leg, initial encounter: Secondary | ICD-10-CM

## 2016-08-15 DIAGNOSIS — Y999 Unspecified external cause status: Secondary | ICD-10-CM | POA: Insufficient documentation

## 2016-08-15 DIAGNOSIS — Y929 Unspecified place or not applicable: Secondary | ICD-10-CM | POA: Insufficient documentation

## 2016-08-15 DIAGNOSIS — M79604 Pain in right leg: Secondary | ICD-10-CM | POA: Diagnosis present

## 2016-08-15 DIAGNOSIS — T814XXA Infection following a procedure, initial encounter: Secondary | ICD-10-CM | POA: Diagnosis not present

## 2016-08-15 DIAGNOSIS — J45909 Unspecified asthma, uncomplicated: Secondary | ICD-10-CM | POA: Insufficient documentation

## 2016-08-15 DIAGNOSIS — Y69 Unspecified misadventure during surgical and medical care: Secondary | ICD-10-CM | POA: Insufficient documentation

## 2016-08-15 DIAGNOSIS — F17210 Nicotine dependence, cigarettes, uncomplicated: Secondary | ICD-10-CM | POA: Insufficient documentation

## 2016-08-15 DIAGNOSIS — Y939 Activity, unspecified: Secondary | ICD-10-CM | POA: Diagnosis not present

## 2016-08-15 LAB — URINALYSIS, ROUTINE W REFLEX MICROSCOPIC
Bilirubin Urine: NEGATIVE
GLUCOSE, UA: NEGATIVE mg/dL
Hgb urine dipstick: NEGATIVE
KETONES UR: NEGATIVE mg/dL
LEUKOCYTES UA: NEGATIVE
Nitrite: NEGATIVE
PH: 5 (ref 5.0–8.0)
Protein, ur: NEGATIVE mg/dL
Specific Gravity, Urine: 1.004 — ABNORMAL LOW (ref 1.005–1.030)

## 2016-08-15 LAB — COMPREHENSIVE METABOLIC PANEL
ALBUMIN: 4.1 g/dL (ref 3.5–5.0)
ALT: 19 U/L (ref 14–54)
ANION GAP: 13 (ref 5–15)
AST: 23 U/L (ref 15–41)
Alkaline Phosphatase: 62 U/L (ref 38–126)
BUN: 8 mg/dL (ref 6–20)
CO2: 18 mmol/L — AB (ref 22–32)
Calcium: 9.1 mg/dL (ref 8.9–10.3)
Chloride: 103 mmol/L (ref 101–111)
Creatinine, Ser: 0.71 mg/dL (ref 0.44–1.00)
GFR calc Af Amer: >60 mL/min (ref >60)
GFR calc non Af Amer: >60 mL/min (ref >60)
GLUCOSE: 131 mg/dL — AB (ref 65–99)
POTASSIUM: 3.1 mmol/L — AB (ref 3.5–5.1)
SODIUM: 134 mmol/L — AB (ref 135–145)
Total Bilirubin: 0.5 mg/dL (ref 0.3–1.2)
Total Protein: 7.9 g/dL (ref 6.5–8.1)

## 2016-08-15 LAB — I-STAT CG4 LACTIC ACID, ED
LACTIC ACID, VENOUS: 2.94 mmol/L — AB (ref 0.5–1.9)
Lactic Acid, Venous: 0.84 mmol/L (ref 0.5–1.9)

## 2016-08-15 LAB — CBC WITH DIFFERENTIAL/PLATELET
BASOS ABS: 0 10*3/uL (ref 0.0–0.1)
Basophils Relative: 0 %
Eosinophils Absolute: 0 10*3/uL (ref 0.0–0.7)
Eosinophils Relative: 0 %
HEMATOCRIT: 37.2 % (ref 36.0–46.0)
Hemoglobin: 11.3 g/dL — ABNORMAL LOW (ref 12.0–15.0)
LYMPHS ABS: 2.4 10*3/uL (ref 0.7–4.0)
LYMPHS PCT: 26 %
MCH: 24.2 pg — AB (ref 26.0–34.0)
MCHC: 30.4 g/dL (ref 30.0–36.0)
MCV: 79.8 fL (ref 78.0–100.0)
MONO ABS: 0.6 10*3/uL (ref 0.1–1.0)
MONOS PCT: 7 %
NEUTROS ABS: 6.3 10*3/uL (ref 1.7–7.7)
Neutrophils Relative %: 67 %
Platelets: 356 10*3/uL (ref 150–400)
RBC: 4.66 MIL/uL (ref 3.87–5.11)
RDW: 16.8 % — AB (ref 11.5–15.5)
WBC: 9.4 10*3/uL (ref 4.0–10.5)

## 2016-08-15 MED ORDER — CEPHALEXIN 500 MG PO CAPS: 500.0000 mg | ORAL_CAPSULE | Freq: Four times a day (QID) | ORAL | 0 refills | Status: DC

## 2016-08-15 MED ORDER — SULFAMETHOXAZOLE-TRIMETHOPRIM 800-160 MG PO TABS: 1.0000 | ORAL_TABLET | Freq: Two times a day (BID) | ORAL | 0 refills | Status: AC

## 2016-08-15 NOTE — ED Provider Notes (Addendum)
[facility]-EMERGENCY DEPT Provider Note   CSN: [id number] Arrival date & time: 08/15/16  0207  By signing my name below, I, Ny'Kea Lewis, attest that this documentation has been prepared under the direction and in the presence of Jaxtin Raimondo, Mayer Masker, MD. Electronically Signed: Karren Cobble, ED Scribe. 08/15/16. 3:30 AM.  History   Chief Complaint Chief Complaint  Patient presents with  . Leg Pain   The history is provided by the patient. No language interpreter was used.   HPI Comments: Monique [name] is a 41 y.o. female with a history of anxiety, anemia, asthma, bipolar disorder, GERD, depression, and migraines brought in by ambulance, who presents to the Emergency Department complaining of gradually worsening right leg drainage that began today, she rates the severity of her pain a 9/10. She notes associated pain, subjective fever, weakness, and fatigue. 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 At the time she was placed on an antibiotic which she finished a few days ago. She also reports an area of the wound that has been open since the surgery. She was also prescribed a cream to treat that area with no improvement. Yesterday, she noted the area began to have a discharge of blood and a yellow colored fluid. Pt is followed by Dr. [name]. Denies chest pain, shortness of breath, nausea, emesis, or diarrhea at this time.   I have reviewed the patient's chart. Patient was admitted in May. She was discharged on Keflex.  Past Medical History:  Diagnosis Date  . Anemia   . Anxiety   . Arthritis   . Asthma    PHM  . Bipolar disorder (HCC)   . Chronic back pain    "middle and lower" (12/17/2014)  . Depression   . GERD (gastroesophageal reflux disease)   . H/O blood clots   . Infection right ankle  . Migraine    "weekly" (12/17/2014)  . MRSA infection   . Nerve damage of right foot   . Osteomyelitis of ankle [facility])    right   Patient Active Problem List   Diagnosis Date Noted  . History of osteomyelitis   . Depression with anxiety   . Hypokalemia 07/08/2016  . Hyponatremia 07/08/2016  . 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 Polysubstance abuse 03/15/2015  . Phantom pain (HCC) 03/15/2015  . Elevated lactic acid level 03/15/2015  . BKA stump complication (HCC)   . Fever   . Osteomyelitis (HCC) 03/02/2015  . Cellulitis 03/02/2015  . Cellulitis and abscess 12/17/2014  . Nicotine abuse 12/17/2014  . Anemia, chronic disease 12/17/2014  . Conjunctivitis, left eye   . Wound infection after surgery 09/29/2014  . Irritation of left eye 09/29/2014  . Wound, surgical, infected 09/29/2014  . Acute encephalopathy 08/29/2014  . Drug abuse 08/29/2014  . Cocaine abuse 08/29/2014  . Acute respiratory failure with hypoxia (HCC) 08/29/2014   Past Surgical History:  Procedure Laterality Date  . AMPUTATION Right 03/04/2015   Procedure: Right Below Knee Amputation;  Surgeon: [name], MD;  Location: [facility] OR;  Service: Orthopedics;  Laterality: Right;  . ANKLE FRACTURE SURGERY Right 82016  . ANKLE FUSION Right 01/19/2015   Procedure: Right Tibiotalar Fusion, Place Antibiotic Beads;  Surgeon: [name], MD;  Location: [facility] OR;  Service: Orthopedics;  Laterality: Right;  . DILATION AND CURETTAGE OF UTERUS  X 2  . EXTERNAL FIXATION LEG Right 01/19/2015   Procedure: EXTERNAL FIXATION Right Ankle;  Surgeon:  [name], MD;  Location[facility];  Service: Orthopedics;  Laterality: Right;  . EXTERNAL FIXATION REMOVAL Right 03/04/2015   Procedure: REMOVAL EXTERNAL FIXATION Right Lower Leg;  Surgeon: [name], MD;  Location: [facility] OR;  Service: Orthopedics;  Laterality: Right;  . FRACTURE SURGERY    . HARDWARE REMOVAL Right 09/02/2014   Procedure: Removal Right Lateral Ankle Hardware, Place Antibiotic Bead ;  Surgeon: [name], MD;  Location: [facility] ORS;  Service: Orthopedics;  Laterality: Right;  . INCISION AND DRAINAGE ABSCESS Right  12/19/2014   Procedure: INCISION AND DRAINAGE OF ABSCESS ON RIGHT SIDE OF CHIN;  Surgeon: [name], MD;  Location: [facility] OR;  Service: ENT;  Laterality: Right;  . STUMP REVISION Right 04/14/2015   Procedure: RIGHT BELOW KNEE AMPUTATION REVISION;  Surgeon: [name], MD;  Location: [facility] OR;  Service: Orthopedics;  Laterality: Right;  . STUMP REVISION Right 07/11/2016   Procedure: Revision Right Below Knee Amputation, Fibula;  Surgeon: [name], MD;  Location: [facility] OR;  Service: Orthopedics;  Laterality: Right;  . TUBAL LIGATION     OB History    No data available     Home Medications    Prior to Admission medications   Medication Sig Start Date End Date Taking? Authorizing Provider  ALPRAZolam Prudy Feeler) 0.5 MG tablet Take 1 tablet (0.5 mg total) by mouth 2 (two) times daily as needed for anxiety. Patient not taking: Reported on 08/15/2016 01/18/16   [name], MD  buPROPion [facility] SR) 100 MG 12 hr tablet Take 1 tablet (100 mg total) by mouth 2 (two) times daily. Patient not taking: Reported on 08/15/2016 03/24/15   Henrietta Hoover, NP  cephALEXin (KEFLEX) 500 MG capsule Take 1 capsule (500 mg total) by mouth 4 (four) times daily. 08/15/16   Modesty Rudy, Mayer Masker, MD  cyclobenzaprine (FLEXERIL) 10 MG tablet Take 1 tablet (10 mg total) by mouth 3 (three) times daily as needed for muscle spasms. Patient not taking: Reported on 08/15/2016 12/22/15   Adonis Huguenin, NP  docusate sodium (COLACE) 100 MG capsule Take 1 capsule (100 mg total) by mouth 2 (two) times daily. Patient not taking: Reported on 08/15/2016 07/13/16   Rodolph Bong, MD  doxycycline (VIBRA-TABS) 100 MG tablet Take 1 tablet (100 mg total) by mouth 2 (two) times daily. Patient not taking: Reported on 08/15/2016 07/30/16   [name], MD  ferrous sulfate 325 (65 FE) MG tablet Take 1 tablet (325 mg total) by mouth daily with breakfast. Patient not taking: Reported on 08/15/2016 03/28/15   Henrietta Hoover, NP    gabapentin (NEURONTIN) 300 MG capsule Take 1 capsule (300 mg total) by mouth 3 (three) times daily. Patient not taking: Reported on 08/15/2016 03/24/15   Henrietta Hoover, NP  ketorolac (TORADOL) 10 MG tablet Take 1 tablet (10 mg total) by mouth every 6 (six) hours as needed. Patient not taking: Reported on 08/15/2016 07/30/16   [name], MD  meloxicam (MOBIC) 7.5 MG tablet Take 1 tablet (7.5 mg total) by mouth daily. Patient not taking: Reported on 08/15/2016 08/03/16 08/03/17  Adonis Huguenin, NP  methocarbamol (ROBAXIN) 500 MG tablet Take 1 tablet (500 mg total) by mouth every 6 (six) hours as needed for muscle spasms. Patient not taking: Reported on 08/15/2016 03/24/15   Henrietta Hoover, NP  ondansetron (ZOFRAN) 4 MG tablet Take 1 tablet (4 mg total) by mouth every 8 (eight) hours as needed for  nausea or vomiting. Patient not taking: Reported on 08/15/2016 07/21/16   Devoria Albe, MD  oxyCODONE-acetaminophen (PERCOCET) 10-325 MG tablet Take 1 tablet by mouth every 8 (eight) hours as needed for pain. Patient not taking: Reported on 08/15/2016 07/20/16   Adonis Huguenin, NP  pantoprazole (PROTONIX) 40 MG tablet Take 1 tablet (40 mg total) by mouth daily at 6 (six) AM. Patient not taking: Reported on 08/15/2016 07/14/16   Rodolph Bong, MD  polyethylene glycol [facility] Ethelene Hal) packet Take 17 g by mouth daily as needed for mild constipation. Patient not taking: Reported on 08/15/2016 07/13/16   Rodolph Bong, MD  senna-docusate (SENOKOT-S) 8.6-50 MG tablet Take 1 tablet by mouth at bedtime as needed for mild constipation. Patient not taking: Reported on 08/15/2016 03/06/15   Richarda Overlie, MD  silver sulfADIAZINE (SILVADENE) 1 % cream Apply 1 application topically daily. Patient not taking: Reported on 08/15/2016 08/02/16   [name], MD  sulfamethoxazole-trimethoprim (BACTRIM DS,SEPTRA DS) 800-160 MG tablet Take 1 tablet by mouth 2 (two) times daily. 08/15/16 08/22/16  Shon Baton, MD     Family History Family History  Problem Relation Age of Onset  . Lung cancer Mother   . Cancer Maternal Uncle   . Cancer Maternal Grandfather   . Cancer Paternal Grandfather     Social History Social History  Substance Use Topics  . Smoking status: Current Every Day Smoker    Packs/day: 1.00    Years: 25.00    Types: Cigarettes  . Smokeless tobacco: Never Used  . Alcohol use Yes     Comment:  " 7 months ago " 01/18/15   Allergies   No known allergies  Review of Systems Review of Systems  Constitutional: Positive for chills. Negative for fever.  Respiratory: Negative for shortness of breath.   Cardiovascular: Negative for chest pain.  Gastrointestinal: Negative for diarrhea, nausea and vomiting.  Musculoskeletal:       Right stump pain.  Skin: Positive for wound.       Discharge from surgical site.  All other systems reviewed and are negative.  Physical Exam Updated Vital Signs BP 115/75   Pulse 81   Temp 98.3 F (36.8 C) (Oral)   Resp 19   Ht 5\' 4"  (1.626 m)   Wt 81.6 kg (180 lb)   SpO2 98%   BMI 30.90 kg/m   Physical Exam  Constitutional: She is oriented to person, place, and time. She appears well-developed and well-nourished. No distress.  HENT:  Head: Normocephalic and atraumatic.  Cardiovascular: Normal rate, regular rhythm and normal heart sounds.   No murmur heard. Pulmonary/Chest: Effort normal and breath sounds normal. No respiratory distress. She has no wheezes.  Abdominal: Soft. There is no tenderness.  Musculoskeletal:  Right BKA, tenderness to palpation adjacent to wound, approximately 1.5 cm open wound on the lateral aspect of the prior repair, granulated tissue around the outside, no spontaneous drainage noted, no significant erythema, no fluctuance or crepitus  Neurological: She is alert and oriented to person, place, and time.  Skin: Skin is warm and dry.  Psychiatric: She has a normal mood and affect.  Nursing note and vitals  reviewed.   ED Treatments / Results  DIAGNOSTIC STUDIES: Oxygen Saturation is 97% on RA, adqueate by my interpretation.   COORDINATION OF CARE: 3:04 AM-Discussed next steps with pt. Pt verbalized understanding and is agreeable with the plan.   Labs (all labs ordered are listed, but only abnormal results  are displayed) Labs Reviewed  COMPREHENSIVE METABOLIC PANEL - Abnormal; Notable for the following:       Result Value   Sodium 134 (*)    Potassium 3.1 (*)    CO2 18 (*)    Glucose, Bld 131 (*)    All other components within normal limits  CBC WITH DIFFERENTIAL/PLATELET - Abnormal; Notable for the following:    Hemoglobin 11.3 (*)    MCH 24.2 (*)    RDW 16.8 (*)    All other components within normal limits  URINALYSIS, ROUTINE W REFLEX MICROSCOPIC - Abnormal; Notable for the following:    Color, Urine STRAW (*)    Specific Gravity, Urine 1.004 (*)    All other components within normal limits  I-STAT CG4 LACTIC ACID, ED - Abnormal; Notable for the following:    Lactic Acid, Venous 2.94 (*)    All other components within normal limits  CULTURE, BLOOD (ROUTINE X 2)  CULTURE, BLOOD (ROUTINE X 2)  I-STAT CG4 LACTIC ACID, ED   EKG  EKG Interpretation None      Radiology Dg Knee 2 Views Right  Result Date: 08/15/2016 CLINICAL DATA:  Initial evaluation for ir wound to right stump for 2 weeks. Question infection. EXAM: RIGHT KNEE - 1-2 VIEW COMPARISON:  Prior radiograph from 07/08/2016. FINDINGS: The irregular soft tissue ulceration present at the distal and lateral aspect of the right BKA stump. No radiopaque foreign body. No dissecting soft tissue emphysema. No radiographic evidence for underlying osteomyelitis. Mild diffuse soft tissue swelling at the distal stomach. No acute fracture or dislocation. IMPRESSION: 1. Soft tissue ulceration at the distal and lateral aspect of the right BKA stump with associated diffuse soft tissue swelling. Infection could be considered in the  correct clinical setting. No radiographic evidence for osteomyelitis. No dissecting soft tissue emphysema. 2. No acute osseous abnormality identified. Electronically Signed   By: Rise Mu M.D.   On: 08/15/2016 03:35    Procedures Procedures (including critical care time)  Medications Ordered in ED Medications - No data to display  Initial Impression / Assessment and Plan / ED Course  I have reviewed the triage vital signs and the nursing notes.  Pertinent labs & imaging results that were available during my care of the patient were reviewed by me and considered in my medical decision making (see chart for details).     Patient presents with concerns for infection of the right BKA stump. She is nontoxic-appearing. Afebrile. She has a chronic wound.  There does not appear to be any adjacent cellulitis. White count is normal. X-ray questions infection but likely related to known open wound. Discussed with Dr. Magnus Ivan. Given change in drainage, will elect place patient on antibiotics. Close follow-up recommended with Dr. [name]. Wet-to-dry dressings recommended.  Patient has been hemodynamically stable while in the emergency department. No signs or symptoms of sepsis or ongoing infection.  After history, exam, and medical workup I feel the patient has been appropriately medically screened and is safe for discharge home. Pertinent diagnoses were discussed with the patient. Patient was given return precautions.  7:36 AM I was informed by nursing that the patient now is not wanting to leave. She is reporting increased depressive symptoms. She denies SI or HI. On my evaluation, patient states that she has had increasing depressive symptoms. She has an outpatient psychiatrist. She's not on any medications. She reports alcohol and drug abuse. She also reports that effectively she has no place to live. She states "  I just want to figure out a way to stay." She does not meet any criteria for  inpatient psychiatric admission. However, I have offered to have our case manager talk to her regarding resources as an outpatient.   Final Clinical Impressions(s) / ED Diagnoses   Final diagnoses:  Right leg pain  Wound of right lower extremity, initial encounter   New Prescriptions New Prescriptions   CEPHALEXIN (KEFLEX) 500 MG CAPSULE    Take 1 capsule (500 mg total) by mouth 4 (four) times daily.   SULFAMETHOXAZOLE-TRIMETHOPRIM (BACTRIM DS,SEPTRA DS) 800-160 MG TABLET    Take 1 tablet by mouth 2 (two) times daily.   I personally performed the services described in this documentation, which was scribed in my presence. The recorded information has been reviewed and is accurate.    Shon BatonHorton, Keimya Briddell F, MD 08/15/16 [id number]    Shon BatonHorton, Clio Gerhart F, MD 08/15/16 [phone]

## 2016-08-15 NOTE — ED Notes (Signed)
Horton, MD at bedside.  

## 2016-08-15 NOTE — ED Notes (Signed)
Pt requesting cab voucher upon d/c. Charge RN was notified. Pt was told that we have bus passes available or she could try to call somebody to get her. Pt told this RN that she needed to talk to me and that she felt depressed and that she couldn't leave here because she feels like she is killing herself going back into the streets. Pt reports that she is staying in a motel and was requesting detox. EDP aware.

## 2016-08-15 NOTE — ED Notes (Signed)
Patient transported to X-ray 

## 2016-08-15 NOTE — Discharge Planning (Signed)
EDCM consulted to assist pt with resources for homelessness and detox.  EDCM gave pt homeless and food pantry handouts and reviewed them with her.  Pt familiar with the area and states she will be able to find these places without a problem.  Pt very appreciative.

## 2016-08-15 NOTE — ED Triage Notes (Signed)
Patient from home, BKA on right over 1 1/2 years ago, had a bone infection a few weeks ago, had a surgery to clean it out, now incision on stump is open and draining with a odor.

## 2016-08-15 NOTE — ED Provider Notes (Addendum)
  Physical Exam  BP 115/75   Pulse 81   Temp 98.3 F (36.8 C) (Oral)   Resp 19   Ht 5\' 4"  (1.626 m)   Wt 81.6 kg (180 lb)   SpO2 98%   BMI 30.90 kg/m   Physical Exam  ED Course  Procedures  MDM Patient's been seen by case management and given resources. Will discharge home.       Benjiman CorePickering, Onna Nodal, MD 08/15/16 [id number]    Benjiman CorePickering, Jatniel Verastegui, MD 08/15/16 [phone]

## 2016-08-15 NOTE — ED Notes (Signed)
Multiple resources given to patient to follow up with ,

## 2016-08-15 NOTE — Discharge Instructions (Signed)
You were seen today for concerns for infection of your wound. There are no obvious signs of infection at this time. However, given change in pain and drainage, you will be started on antibiotics. It is very important that you follow-up with Dr. Lajoyce Cornersuda. If you develop fever or any new or worsening symptoms she should be reevaluated.

## 2016-08-15 NOTE — ED Notes (Signed)
Patient states she doesn't fell safe going on the streets wants detox. States she drinks often however doesn't admit to drinking everyday. States she has been living with her friend however they fight a lot.

## 2016-08-20 LAB — CULTURE, BLOOD (ROUTINE X 2)
Culture: NO GROWTH
Culture: NO GROWTH
Special Requests: ADEQUATE
Special Requests: ADEQUATE

## 2016-09-17 ENCOUNTER — Emergency Department (HOSPITAL_COMMUNITY): Payer: Medicare Other

## 2016-09-17 ENCOUNTER — Encounter (HOSPITAL_COMMUNITY): Payer: Self-pay | Admitting: Emergency Medicine

## 2016-09-17 ENCOUNTER — Emergency Department (HOSPITAL_COMMUNITY)
Admission: EM | Admit: 2016-09-17 | Discharge: 2016-09-17 | Disposition: A | Discharge status: Home / Self Care | Payer: Medicare Other | Attending: Emergency Medicine | Admitting: Emergency Medicine

## 2016-09-17 DIAGNOSIS — R52 Pain, unspecified: Secondary | ICD-10-CM

## 2016-09-17 DIAGNOSIS — L03115 Cellulitis of right lower limb: Secondary | ICD-10-CM | POA: Diagnosis not present

## 2016-09-17 DIAGNOSIS — F17210 Nicotine dependence, cigarettes, uncomplicated: Secondary | ICD-10-CM | POA: Diagnosis not present

## 2016-09-17 DIAGNOSIS — J45909 Unspecified asthma, uncomplicated: Secondary | ICD-10-CM | POA: Diagnosis not present

## 2016-09-17 DIAGNOSIS — Z79899 Other long term (current) drug therapy: Secondary | ICD-10-CM | POA: Insufficient documentation

## 2016-09-17 DIAGNOSIS — M79661 Pain in right lower leg: Secondary | ICD-10-CM | POA: Diagnosis present

## 2016-09-17 LAB — COMPREHENSIVE METABOLIC PANEL
ALT: 34 U/L (ref 14–54)
ANION GAP: 10 (ref 5–15)
AST: 23 U/L (ref 15–41)
Albumin: 4.1 g/dL (ref 3.5–5.0)
Alkaline Phosphatase: 54 U/L (ref 38–126)
BUN: 10 mg/dL (ref 6–20)
CO2: 21 mmol/L — ABNORMAL LOW (ref 22–32)
Calcium: 9.2 mg/dL (ref 8.9–10.3)
Chloride: 111 mmol/L (ref 101–111)
Creatinine, Ser: 0.67 mg/dL (ref 0.44–1.00)
Glucose, Bld: 113 mg/dL — ABNORMAL HIGH (ref 65–99)
POTASSIUM: 3.7 mmol/L (ref 3.5–5.1)
Sodium: 142 mmol/L (ref 135–145)
Total Bilirubin: 0.3 mg/dL (ref 0.3–1.2)
Total Protein: 8.2 g/dL — ABNORMAL HIGH (ref 6.5–8.1)

## 2016-09-17 LAB — CBC WITH DIFFERENTIAL/PLATELET
BASOS ABS: 0 10*3/uL (ref 0.0–0.1)
Basophils Relative: 0 %
Eosinophils Absolute: 0 10*3/uL (ref 0.0–0.7)
Eosinophils Relative: 1 %
HCT: 34.8 % — ABNORMAL LOW (ref 36.0–46.0)
HEMOGLOBIN: 10.9 g/dL — AB (ref 12.0–15.0)
LYMPHS ABS: 3 10*3/uL (ref 0.7–4.0)
Lymphocytes Relative: 36 %
MCH: 24.3 pg — AB (ref 26.0–34.0)
MCHC: 31.3 g/dL (ref 30.0–36.0)
MCV: 77.7 fL — AB (ref 78.0–100.0)
Monocytes Absolute: 0.6 10*3/uL (ref 0.1–1.0)
Monocytes Relative: 7 %
NEUTROS PCT: 56 %
Neutro Abs: 4.8 10*3/uL (ref 1.7–7.7)
Platelets: 419 10*3/uL — ABNORMAL HIGH (ref 150–400)
RBC: 4.48 MIL/uL (ref 3.87–5.11)
RDW: 16.3 % — ABNORMAL HIGH (ref 11.5–15.5)
WBC: 8.4 10*3/uL (ref 4.0–10.5)

## 2016-09-17 LAB — I-STAT CG4 LACTIC ACID, ED: LACTIC ACID, VENOUS: 1.11 mmol/L (ref 0.5–1.9)

## 2016-09-17 MED ORDER — OXYCODONE-ACETAMINOPHEN 5-325 MG PO TABS
1.0000 | ORAL_TABLET | Freq: Once | ORAL | Status: AC
  Administered 2016-09-17: 1 via ORAL
  Filled 2016-09-17: qty 1

## 2016-09-17 MED ORDER — CEPHALEXIN 500 MG PO CAPS
500.0000 mg | ORAL_CAPSULE | Freq: Once | ORAL | Status: AC
  Administered 2016-09-17: 500 mg via ORAL
  Filled 2016-09-17: qty 1

## 2016-09-17 MED ORDER — IOPAMIDOL (ISOVUE-300) INJECTION 61%
100.0000 mL | Freq: Once | INTRAVENOUS | Status: AC | PRN
Start: 2016-09-17 — End: 2016-09-17
  Administered 2016-09-17: 100 mL via INTRAVENOUS

## 2016-09-17 MED ORDER — IOPAMIDOL (ISOVUE-300) INJECTION 61%
INTRAVENOUS | Status: AC
  Filled 2016-09-17: qty 100

## 2016-09-17 MED ORDER — CEPHALEXIN 500 MG PO CAPS: 500.0000 mg | ORAL_CAPSULE | Freq: Four times a day (QID) | ORAL | 0 refills | Status: AC

## 2016-09-17 MED ORDER — SULFAMETHOXAZOLE-TRIMETHOPRIM 800-160 MG PO TABS: 1.0000 | ORAL_TABLET | Freq: Two times a day (BID) | ORAL | 0 refills | Status: AC

## 2016-09-17 MED ORDER — SULFAMETHOXAZOLE-TRIMETHOPRIM 800-160 MG PO TABS
1.0000 | ORAL_TABLET | Freq: Once | ORAL | Status: AC
Start: 2016-09-17 — End: 2016-09-17
  Administered 2016-09-17: 1 via ORAL
  Filled 2016-09-17: qty 1

## 2016-09-17 NOTE — Discharge Instructions (Signed)
Please take all of your antibiotics until finished!   You may develop abdominal discomfort or diarrhea from the antibiotic.  You may help offset this with probiotics which you can buy or get in yogurt. Do not eat  or take the probiotics until 2 hours after your antibiotic.   Keep the wound clean and dry. Apply antibiotic ointment twice daily. Follow up with Dr. Lajoyce Cornersuda for reevaluation within 1 wee for further management. Return to the ED immediately if any concerning signs or symptoms develop such as worsening redness, fevers, or abnormal drainage.

## 2016-09-17 NOTE — ED Provider Notes (Signed)
[facility]-EMERGENCY DEPT Provider Note   CSN: [id number] Arrival date & time: 09/17/16  [id number]     History   Chief Complaint Chief Complaint  Patient presents with  . Leg Pain  . Post-op Problem    HPI Monique [name] is a 41 y.o. female with history of anxiety, asthma, bipolar disorder, chronic back pain, ostium myelitis of the right ankle who presents today with complaint of intermittent, gradually worsening right lower leg pain. 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 She is followed by Dr. Lajoyce Cornersuda in orthopedics for this. She has been seen and evaluated for slow healing wound of the right lower extremity. She states that the area is tender and "feels like it in the bone". Pain is constant and sharp. No aggravating or alleviating factors noted. She has tried Advil without relief. States the area drains clear and yellow fluid constantly. Denies fevers, chills, numbness, tingling, or weakness. Most recently she was seen and evaluated 08/15/2016 in the ED and was started on antibiotics and was told to follow-up with her orthopedist which she has not done.She states that her symptoms improved for some time but then returned one week ago.   The history is provided by the patient.    Past Medical History:  Diagnosis Date  . Anemia   . Anxiety   . Arthritis   . Asthma    PHM  . Bipolar disorder (HCC)   . Chronic back pain    "middle and lower" (12/17/2014)  . Depression   . GERD (gastroesophageal reflux disease)   . H/O blood clots   . Infection right ankle  . Migraine    "weekly" (12/17/2014)  . MRSA infection   . Nerve damage of right foot   . Osteomyelitis of ankle [facility](HCC)    right    Patient Active Problem List   Diagnosis Date Noted  . History of osteomyelitis   . Depression with anxiety   . Hypokalemia 07/08/2016  . Hyponatremia 07/08/2016  . Infection of below knee amputation stump (HCC)  04/14/2015  . Polysubstance abuse 03/15/2015  . Phantom pain (HCC) 03/15/2015  . Elevated lactic acid level 03/15/2015  . BKA stump complication (HCC)   . Fever   . Osteomyelitis (HCC) 03/02/2015  . Cellulitis 03/02/2015  . Cellulitis and abscess 12/17/2014  . Nicotine abuse 12/17/2014  . Anemia, chronic disease 12/17/2014  . Conjunctivitis, left eye   . Wound infection after surgery 09/29/2014  . Irritation of left eye 09/29/2014  . Wound, surgical, infected 09/29/2014  . Acute encephalopathy 08/29/2014  . Drug abuse 08/29/2014  . Cocaine abuse 08/29/2014  . Acute respiratory failure with hypoxia (HCC) 08/29/2014    Past Surgical History:  Procedure Laterality Date  . AMPUTATION Right 03/04/2015   Procedure: Right Below Knee Amputation;  Surgeon: Nadara MustardMarcus Duda V, MD;  [facility];  Service: Orthopedics;  Laterality: Right;  . ANKLE FRACTURE SURGERY Right 82016  . ANKLE FUSION Right 01/19/2015   Procedure: Right Tibiotalar Fusion, Place Antibiotic Beads;  Surgeon: Nadara MustardMarcus Duda V, MD;  Location: [facility] OR;  Service: Orthopedics;  Laterality: Right;  . DILATION AND CURETTAGE OF UTERUS  X 2  . EXTERNAL FIXATION LEG Right 01/19/2015   Procedure: EXTERNAL FIXATION Right Ankle;  Surgeon: Nadara MustardMarcus Duda V, MD;  [facility];  Service: Orthopedics;  Laterality: Right;  . EXTERNAL FIXATION REMOVAL Right 03/04/2015   Procedure: REMOVAL EXTERNAL  FIXATION Right Lower Leg;  Surgeon: [name], MD;  Location: [facility] OR;  Service: Orthopedics;  Laterality: Right;  . FRACTURE SURGERY    . HARDWARE REMOVAL Right 09/02/2014   Procedure: Removal Right Lateral Ankle Hardware, Place Antibiotic Bead ;  Surgeon: [name], MD;  Location: [facility] ORS;  Service: Orthopedics;  Laterality: Right;  . INCISION AND DRAINAGE ABSCESS Right 12/19/2014   Procedure: INCISION AND DRAINAGE OF ABSCESS ON RIGHT SIDE OF CHIN;  Surgeon: [name], MD;  Location: [facility] OR;  Service: ENT;  Laterality: Right;  . STUMP REVISION  Right 04/14/2015   Procedure: RIGHT BELOW KNEE AMPUTATION REVISION;  Surgeon: [name], MD;  Location: [facility] OR;  Service: Orthopedics;  Laterality: Right;  . STUMP REVISION Right 07/11/2016   Procedure: Revision Right Below Knee Amputation, Fibula;  Surgeon: [name], MD;  Location: [facility] OR;  Service: Orthopedics;  Laterality: Right;  . TUBAL LIGATION      OB History    No data available       Home Medications    Prior to Admission medications   Medication Sig Start Date End Date Taking? Authorizing Provider  ALPRAZolam Prudy Feeler) 1 MG tablet Take 1 mg by mouth 3 (three) times daily.   Yes [provider]  naltrexone (DEPADE) 50 MG tablet Take 25 mg by mouth daily.   Yes [provider]  ALPRAZolam (XANAX) 0.5 MG tablet Take 1 tablet (0.5 mg total) by mouth 2 (two) times daily as needed for anxiety. Patient not taking: Reported on 09/17/2016 01/18/16   [name], MD  buPROPion [facility] SR) 100 MG 12 hr tablet Take 1 tablet (100 mg total) by mouth 2 (two) times daily. Patient not taking: Reported on 08/15/2016 03/24/15   Henrietta Hoover, NP  cephALEXin (KEFLEX) 500 MG capsule Take 1 capsule (500 mg total) by mouth 4 (four) times daily. 09/17/16 09/27/16  Michela Pitcher A, PA-C  cyclobenzaprine (FLEXERIL) 10 MG tablet Take 1 tablet (10 mg total) by mouth 3 (three) times daily as needed for muscle spasms. Patient not taking: Reported on 08/15/2016 12/22/15   Adonis Huguenin, NP  docusate sodium (COLACE) 100 MG capsule Take 1 capsule (100 mg total) by mouth 2 (two) times daily. Patient not taking: Reported on 08/15/2016 07/13/16   Rodolph Bong, MD  doxycycline (VIBRA-TABS) 100 MG tablet Take 1 tablet (100 mg total) by mouth 2 (two) times daily. 07/30/16   [name], MD  ferrous sulfate 325 (65 FE) MG tablet Take 1 tablet (325 mg total) by mouth daily with breakfast. Patient not taking: Reported on 08/15/2016 03/28/15   Henrietta Hoover, NP  gabapentin (NEURONTIN)  300 MG capsule Take 1 capsule (300 mg total) by mouth 3 (three) times daily. Patient not taking: Reported on 08/15/2016 03/24/15   Henrietta Hoover, NP  ketorolac (TORADOL) 10 MG tablet Take 1 tablet (10 mg total) by mouth every 6 (six) hours as needed. 07/30/16   [name], MD  meloxicam (MOBIC) 7.5 MG tablet Take 1 tablet (7.5 mg total) by mouth daily. Patient not taking: Reported on 08/15/2016 08/03/16 08/03/17  Adonis Huguenin, NP  methocarbamol (ROBAXIN) 500 MG tablet Take 1 tablet (500 mg total) by mouth every 6 (six) hours as needed for muscle spasms. Patient not taking: Reported on 08/15/2016 03/24/15   Henrietta Hoover, NP  ondansetron (ZOFRAN) 4 MG tablet Take 1 tablet (4 mg total) by mouth every 8 (eight) hours  as needed for nausea or vomiting. Patient not taking: Reported on 08/15/2016 07/21/16   Devoria Albe, MD  oxyCODONE-acetaminophen (PERCOCET) 10-325 MG tablet Take 1 tablet by mouth every 8 (eight) hours as needed for pain. 07/20/16   Adonis Huguenin, NP  pantoprazole (PROTONIX) 40 MG tablet Take 1 tablet (40 mg total) by mouth daily at 6 (six) AM. Patient not taking: Reported on 08/15/2016 07/14/16   Rodolph Bong, MD  polyethylene glycol [facility] / Ethelene Hal) packet Take 17 g by mouth daily as needed for mild constipation. Patient not taking: Reported on 08/15/2016 07/13/16   Rodolph Bong, MD  senna-docusate (SENOKOT-S) 8.6-50 MG tablet Take 1 tablet by mouth at bedtime as needed for mild constipation. Patient not taking: Reported on 08/15/2016 03/06/15   Richarda Overlie, MD  silver sulfADIAZINE (SILVADENE) 1 % cream Apply 1 application topically daily. 08/02/16   [name], MD  sulfamethoxazole-trimethoprim (BACTRIM DS,SEPTRA DS) 800-160 MG tablet Take 1 tablet by mouth 2 (two) times daily. 09/17/16 09/27/16  Jeanie Sewer, PA-C    Family History Family History  Problem Relation Age of Onset  . Lung cancer Mother   . Cancer Maternal Uncle   . Cancer Maternal Grandfather   .  Cancer Paternal Grandfather     Social History Social History  Substance Use Topics  . Smoking status: Current Every Day Smoker    Packs/day: 1.00    Years: 25.00    Types: Cigarettes  . Smokeless tobacco: Never Used  . Alcohol use Yes     Comment:  " 7 months ago " 01/18/15     Allergies   No known allergies   Review of Systems Review of Systems  Constitutional: Negative for chills and fever.  Skin: Positive for wound.     Physical Exam Updated Vital Signs BP (!) 100/54 (BP Location: Right Arm)   Pulse 73   Temp 98.6 F (37 C) (Oral)   Resp 16   LMP 08/27/2016 (Approximate)   SpO2 96%   Physical Exam  Constitutional: She appears well-developed and well-nourished. No distress.  Resting comfortably in bed, sleeping but easily arousable  HENT:  Head: Normocephalic and atraumatic.  Eyes: Conjunctivae are normal. Right eye exhibits no discharge. Left eye exhibits no discharge.  Neck: No JVD present. No tracheal deviation present.  Cardiovascular: Normal rate and intact distal pulses.   2+ DP/PT pulses in the left lower extremity  Pulmonary/Chest: Effort normal.  Abdominal: She exhibits no distension.  Musculoskeletal: She exhibits tenderness. She exhibits no edema.  Right below the knee amputation. There is a 4 cm well-healed surgical incision with a 1 cm open wound to the distal portion of the scar. The center of the wound is white with surrounding pink granulation tissue. There is dried white malodorous crust surrounding this area. No surrounding erythema or fluctuance. Diffuse tenderness to palpation over the stump extending up to the knee. No crepitus noted.This area is malodorous.  Neurological: She is alert.  Skin: Skin is warm and dry. No erythema.  Psychiatric: She has a normal mood and affect. Her behavior is normal.  Nursing note and vitals reviewed.    ED Treatments / Results  Labs (all labs ordered are listed, but only abnormal results are  displayed) Labs Reviewed  COMPREHENSIVE METABOLIC PANEL - Abnormal; Notable for the following:       Result Value   CO2 21 (*)    Glucose, Bld 113 (*)    Total Protein 8.2 (*)  All other components within normal limits  CBC WITH DIFFERENTIAL/PLATELET - Abnormal; Notable for the following:    Hemoglobin 10.9 (*)    HCT 34.8 (*)    MCV 77.7 (*)    MCH 24.3 (*)    RDW 16.3 (*)    Platelets 419 (*)    All other components within normal limits  I-STAT CG4 LACTIC ACID, ED  I-STAT CG4 LACTIC ACID, ED    EKG  EKG Interpretation None       Radiology Ct Knee Right W Contrast  Result Date: 09/17/2016 CLINICAL DATA:  Right leg pain for 1 week. Fluid collection at right BKA stump on this x-ray. EXAM: CT OF THE RIGHT KNEE WITH CONTRAST TECHNIQUE: Multidetector CT imaging was performed following the standard protocol during bolus administration of 100 cc Isovue-300 intravenous contrast. COMPARISON:  Right knee x-rays dated August 15, 2016. FINDINGS: Bones/Joint/Cartilage Postsurgical changes related to right below knee amputation. The bones are osteopenic. No evidence of cortical destruction. No fracture. No knee joint effusion. Ligaments Suboptimally assessed by CT. Soft tissues Small soft tissue ulceration along the lateral aspect of the BKA stump with adjacent skin thickening and subcutaneous fat stranding. No discrete fluid collection. Dystrophic calcification within the distal muscle flap. IMPRESSION: 1. Small soft tissue ulceration along the lateral aspect of the below-knee amputation stump with adjacent skin thickening and subcutaneous fat stranding, as be seen with cellulitis. No discrete fluid collection is identified. No definite evidence of osteomyelitis. Electronically Signed   By: Obie Dredge M.D.   On: 09/17/2016 12:53    Procedures Procedures (including critical care time)  Medications Ordered in ED Medications  sulfamethoxazole-trimethoprim (BACTRIM DS,SEPTRA DS) 800-160 MG  per tablet 1 tablet (not administered)  cephALEXin (KEFLEX) capsule 500 mg (not administered)  oxyCODONE-acetaminophen (PERCOCET/ROXICET) 5-325 MG per tablet 1 tablet (1 tablet Oral Given 09/17/16 0937)  iopamidol (ISOVUE-300) 61 % injection 100 mL (100 mLs Intravenous Contrast Given 09/17/16 1020)     Initial Impression / Assessment and Plan / ED Course  I have reviewed the triage vital signs and the nursing notes.  Pertinent labs & imaging results that were available during my care of the patient were reviewed by me and considered in my medical decision making (see chart for details).     Patient with complaint of acute on chronic pain overlying chronic wound overlying BKA scar. Afebrile, vital signs are stable. No leukocytosis, lactate normal, and no significant electrolyte abnormalities. CT of the right knee shows changes consistent with cellulitis with no discrete fluid collection or abscess identified. There is no evidence of osteomyelitis. Low suspicion of necrotizing fasciitis or erysipelas. Pain managed while in the ED. She is stable for discharge home with Keflex and Bactrim and follow up with Dr. [name] for reevaluation in the next week. Also strongly encouraged patient to follow-up for prosthetics sitting in order to maximize comfort. Discussed indications for return to the ED. First dose of antibiotic given in the ED, and discharged with crutches. Pt verbalized understanding of and agreement with plan and is safe for discharge home at this time.  Final Clinical Impressions(s) / ED Diagnoses   Final diagnoses:  Pain  Cellulitis of right lower extremity    New Prescriptions New Prescriptions   CEPHALEXIN (KEFLEX) 500 MG CAPSULE    Take 1 capsule (500 mg total) by mouth 4 (four) times daily.   SULFAMETHOXAZOLE-TRIMETHOPRIM (BACTRIM DS,SEPTRA DS) 800-160 MG TABLET    Take 1 tablet by mouth 2 (two) times daily.  Bennye Alm 09/17/16 1350    Marily Memos, MD 09/18/16  1715

## 2016-09-17 NOTE — ED Notes (Signed)
Patient was alert, oriented and stable upon discharge. RN went over AVS and patient had no further questions.  

## 2016-09-17 NOTE — ED Provider Notes (Signed)
Medical screening examination/treatment/procedure(s) were conducted as a shared visit with non-physician practitioner(s) and myself.  I personally evaluated the patient during the encounter.  Has some redness, pain and malodorous drainage to R BKA stump. H/o same treated with antibiotics and improved but now is worsening again.  Exam with ttp/erythema to same. VS WNL otherwise. Drainage not purulent.  Plan to eval for e/o abscess if negative, treat for cellulitis with ortho follow up.   Marily MemosMesner, Mariateresa Batra, MD 09/18/16 [phone]

## 2016-09-17 NOTE — ED Triage Notes (Addendum)
Pt c/o right leg pain onset this morning at stump where right leg was amputated. Also nausea, chills, headaches x 1 week. Pt had below-the-knee amputation 1.5 years ago due to trauma, 5-6 weeks ago leg was operated on again for unknown reason. Open wound at site of recent surgery with dried yellow malodorous purulent material.

## 2016-10-15 ENCOUNTER — Ambulatory Visit (INDEPENDENT_AMBULATORY_CARE_PROVIDER_SITE_OTHER): Payer: Medicare Other | Admitting: Family

## 2016-11-08 ENCOUNTER — Encounter: Payer: Self-pay | Admitting: Family Medicine

## 2016-11-08 ENCOUNTER — Ambulatory Visit (INDEPENDENT_AMBULATORY_CARE_PROVIDER_SITE_OTHER): Payer: Medicare Other | Admitting: Family Medicine

## 2016-11-08 VITALS — BP 117/78 | HR 93 | Temp 97.8°F | Resp 16 | Ht 64.0 in | Wt 192.8 lb

## 2016-11-08 DIAGNOSIS — J0100 Acute maxillary sinusitis, unspecified: Secondary | ICD-10-CM | POA: Diagnosis not present

## 2016-11-08 MED ORDER — FLUCONAZOLE 150 MG PO TABS: 150.0000 mg | ORAL_TABLET | Freq: Once | ORAL | 0 refills | Status: AC

## 2016-11-08 MED ORDER — AMOXICILLIN-POT CLAVULANATE 875-125 MG PO TABS: 1.0000 | ORAL_TABLET | Freq: Two times a day (BID) | ORAL | 0 refills | Status: DC

## 2016-11-08 NOTE — Progress Notes (Signed)
Chief Complaint  Patient presents with  . cold symptoms/sinus    onset: 11/07/16, congestion and hard to breathe, pt states she feels bad, nyquil last night for sxs,  up at 4 am blowing nose and trying to breathe    HPI   Pt reports that since 11/07/16 She has been having congestion, cough that is productive of yellow sputum She states that she took nyquil which helped relieve the congestion She states that she also has a sore throat She denies a history of asthma She denies any wheezing   Past Medical History:  Diagnosis Date  . Anemia   . Anxiety   . Arthritis   . Asthma    PHM  . Bipolar disorder (HCC)   . Chronic back pain    "middle and lower" (12/17/2014)  . Depression   . GERD (gastroesophageal reflux disease)   . H/O blood clots   . Infection right ankle  . Migraine    "weekly" (12/17/2014)  . MRSA infection   . Nerve damage of right foot   . Osteomyelitis of ankle (HCC)    right    Current Outpatient Prescriptions  Medication Sig Dispense Refill  . ALPRAZolam (XANAX) 0.5 MG tablet Take 1 tablet (0.5 mg total) by mouth 2 (two) times daily as needed for anxiety. (Patient not taking: Reported on 09/17/2016) 60 tablet 0  . ALPRAZolam (XANAX) 1 MG tablet Take 1 mg by mouth 3 (three) times daily.    Marland Kitchen amoxicillin-clavulanate (AUGMENTIN) 875-125 MG tablet Take 1 tablet by mouth 2 (two) times daily. 20 tablet 0  . buPROPion (WELLBUTRIN SR) 100 MG 12 hr tablet Take 1 tablet (100 mg total) by mouth 2 (two) times daily. (Patient not taking: Reported on 08/15/2016) 90 tablet 0  . cyclobenzaprine (FLEXERIL) 10 MG tablet Take 1 tablet (10 mg total) by mouth 3 (three) times daily as needed for muscle spasms. (Patient not taking: Reported on 08/15/2016) 30 tablet 0  . docusate sodium (COLACE) 100 MG capsule Take 1 capsule (100 mg total) by mouth 2 (two) times daily. (Patient not taking: Reported on 08/15/2016) 10 capsule 0  . doxycycline (VIBRA-TABS) 100 MG tablet Take 1 tablet  (100 mg total) by mouth 2 (two) times daily. (Patient not taking: Reported on 11/08/2016) 60 tablet 0  . ferrous sulfate 325 (65 FE) MG tablet Take 1 tablet (325 mg total) by mouth daily with breakfast. (Patient not taking: Reported on 08/15/2016) 90 tablet 1  . fluconazole (DIFLUCAN) 150 MG tablet Take 1 tablet (150 mg total) by mouth once. Take second tablet in 3 days 2 tablet 0  . gabapentin (NEURONTIN) 300 MG capsule Take 1 capsule (300 mg total) by mouth 3 (three) times daily. (Patient not taking: Reported on 08/15/2016) 90 capsule 1  . ketorolac (TORADOL) 10 MG tablet Take 1 tablet (10 mg total) by mouth every 6 (six) hours as needed. (Patient not taking: Reported on 11/08/2016) 20 tablet 0  . meloxicam (MOBIC) 7.5 MG tablet Take 1 tablet (7.5 mg total) by mouth daily. (Patient not taking: Reported on 08/15/2016) 30 tablet 2  . methocarbamol (ROBAXIN) 500 MG tablet Take 1 tablet (500 mg total) by mouth every 6 (six) hours as needed for muscle spasms. (Patient not taking: Reported on 08/15/2016) 30 tablet 0  . naltrexone (DEPADE) 50 MG tablet Take 25 mg by mouth daily.    . ondansetron (ZOFRAN) 4 MG tablet Take 1 tablet (4 mg total) by mouth every 8 (eight) hours as needed  for nausea or vomiting. (Patient not taking: Reported on 08/15/2016) 10 tablet 0  . oxyCODONE-acetaminophen (PERCOCET) 10-325 MG tablet Take 1 tablet by mouth every 8 (eight) hours as needed for pain. (Patient not taking: Reported on 11/08/2016) 21 tablet 0  . pantoprazole (PROTONIX) 40 MG tablet Take 1 tablet (40 mg total) by mouth daily at 6 (six) AM. (Patient not taking: Reported on 08/15/2016) 30 tablet 3  . polyethylene glycol (MIRALAX / GLYCOLAX) packet Take 17 g by mouth daily as needed for mild constipation. (Patient not taking: Reported on 08/15/2016) 14 each 0  . senna-docusate (SENOKOT-S) 8.6-50 MG tablet Take 1 tablet by mouth at bedtime as needed for mild constipation. (Patient not taking: Reported on 08/15/2016) 60 tablet 0  .  silver sulfADIAZINE (SILVADENE) 1 % cream Apply 1 application topically daily. (Patient not taking: Reported on 11/08/2016) 50 g 0   No current facility-administered medications for this visit.     Allergies:  Allergies  Allergen Reactions  . No Known Allergies     Past Surgical History:  Procedure Laterality Date  . AMPUTATION Right 03/04/2015   Procedure: Right Below Knee Amputation;  Surgeon: [name], MD;  Location: [facility] OR;  Service: Orthopedics;  Laterality: Right;  . ANKLE FRACTURE SURGERY Right 82016  . ANKLE FUSION Right 01/19/2015   Procedure: Right Tibiotalar Fusion, Place Antibiotic Beads;  Surgeon: [name], MD;  Location: [facility] OR;  Service: Orthopedics;  Laterality: Right;  . DILATION AND CURETTAGE OF UTERUS  X 2  . EXTERNAL FIXATION LEG Right 01/19/2015   Procedure: EXTERNAL FIXATION Right Ankle;  Surgeon: [name], MD;  Location: [facility] OR;  Service: Orthopedics;  Laterality: Right;  . EXTERNAL FIXATION REMOVAL Right 03/04/2015   Procedure: REMOVAL EXTERNAL FIXATION Right Lower Leg;  Surgeon: [name], MD;  Location: [facility] OR;  Service: Orthopedics;  Laterality: Right;  . FRACTURE SURGERY    . HARDWARE REMOVAL Right 09/02/2014   Procedure: Removal Right Lateral Ankle Hardware, Place Antibiotic Bead ;  Surgeon: [name], MD;  Location: [facility] ORS;  Service: Orthopedics;  Laterality: Right;  . INCISION AND DRAINAGE ABSCESS Right 12/19/2014   Procedure: INCISION AND DRAINAGE OF ABSCESS ON RIGHT SIDE OF CHIN;  Surgeon: [name], MD;  Location: [facility] OR;  Service: ENT;  Laterality: Right;  . STUMP REVISION Right 04/14/2015   Procedure: RIGHT BELOW KNEE AMPUTATION REVISION;  Surgeon: [name], MD;  Location: [facility] OR;  Service: Orthopedics;  Laterality: Right;  . STUMP REVISION Right 07/11/2016   Procedure: Revision Right Below Knee Amputation, Fibula;  Surgeon: [name], MD;  Location: [facility] OR;  Service: Orthopedics;  Laterality: Right;  . TUBAL LIGATION       Social History   Social History  . Marital status: Legally Separated    Spouse name: N/A  . Number of children: N/A  . Years of education: N/A   Social History Main Topics  . Smoking status: Current Every Day Smoker    Packs/day: 1.00    Years: 25.00    Types: Cigarettes  . Smokeless tobacco: Never Used  . Alcohol use Yes     Comment:  " 7 months ago " 01/18/15  . Drug use: Yes    Types: Cocaine, Marijuana, Benzodiazepines     Comment: drug screen +  benzos,cocaine, marijuana pt denies using ( denies curent use 12/17/14  . Sexual activity: Yes    Birth control/ protection: None, Surgical   Other Topics  Concern  . None   Social History Narrative   ** Merged History Encounter **        ROS See hpi  Objective: Vitals:   11/08/16 1110  BP: 117/78  Pulse: 93  Resp: 16  Temp: 97.8 F (36.6 C)  TempSrc: Oral  SpO2: 98%  Weight: 192 lb 12.8 oz (87.5 kg)  Height: 5\' 4"  (1.626 m)    Physical Exam General: alert, oriented, in NAD Head: normocephalic, atraumatic, +maxillary sinus tenderness Eyes: EOM intact, no scleral icterus or conjunctival injection Ears: TM clear bilaterally Nose: mucosa nonerythematous, nonedematous Throat: no pharyngeal exudate or erythema Lymph: no posterior auricular, submental or cervical lymph adenopathy Heart: normal rate, normal sinus rhythm, no murmurs Lungs: clear to auscultation bilaterally, no wheezing   Assessment and Plan Monique [name] was seen today for cold symptoms/sinus.  Diagnoses and all orders for this visit:  Acute non-recurrent maxillary sinusitis  Other orders -     amoxicillin-clavulanate (AUGMENTIN) 875-125 MG tablet; Take 1 tablet by mouth 2 (two) times daily. -     fluconazole (DIFLUCAN) 150 MG tablet; Take 1 tablet (150 mg total) by mouth once. Take second tablet in 3 days   Empiric treatment Supportive care Prn diflucan for yeast infection  Monique [name]

## 2016-11-08 NOTE — Patient Instructions (Addendum)
IF you received an x-ray today, you will receive an invoice from [facility]. Please contact [facility] at [phone] with questions or concerns regarding your invoice.   IF you received labwork today, you will receive an invoice from [place]. Please contact [facility] at [phone] with questions or concerns regarding your invoice.   Our billing staff will not be able to assist you with questions regarding bills from these companies.  You will be contacted with the lab results as soon as they are available. The fastest way to get your results is to activate your My Chart account. Instructions are located on the last page of this paperwork. If you have not heard from [place] regarding the results in 2 weeks, please contact this office.    Sinus Rinse What is a sinus rinse? A sinus rinse is a simple home treatment that is used to rinse your sinuses with a sterile mixture of salt and water (saline solution). Sinuses are air-filled spaces in your skull behind the bones of your face and forehead that open into your nasal cavity. You will use the following:  Saline solution.  Neti pot or spray bottle. This releases the saline solution into your nose and through your sinuses. Neti pots and spray bottles can be purchased at [employer], a health food store, or online.  When would I do a sinus rinse? A sinus rinse can help to clear mucus, dirt, dust, or pollen from the nasal cavity. You may do a sinus rinse when you have a cold, a virus, nasal allergy symptoms, a sinus infection, or stuffiness in the nose or sinuses. If you are considering a sinus rinse:  Ask your child's health care provider before performing a sinus rinse on your child.  Do not do a sinus rinse if you have had ear or nasal surgery, ear infection, or blocked ears.  How do I do a sinus rinse?  Wash your hands.  Disinfect your device according to the directions provided and then dry it.  Use  the solution that comes with your device or one that is sold separately in stores. Follow the mixing directions on the package.  Fill your device with the amount of saline solution as directed by the device instructions.  Stand over a sink and tilt your head sideways over the sink.  Place the spout of the device in your upper nostril (the one closer to the ceiling).  Gently pour or squeeze the saline solution into the nasal cavity. The liquid should drain to the lower nostril if you are not overly congested.  Gently blow your nose. Blowing too hard may cause ear pain.  Repeat in the other nostril.  Clean and rinse your device with clean water and then air-dry it. Are there risks of a sinus rinse? Sinus rinse is generally very safe and effective. However, there are a few risks, which include:  A burning sensation in the sinuses. This may happen if you do not make the saline solution as directed. Make sure to follow all directions when making the saline solution.  Infection from contaminated water. This is rare, but possible.  Nasal irritation.  This information is not intended to replace advice given to you by your health care provider. Make sure you discuss any questions you have with your health care provider. Document Released: 09/09/2013 Document Revised: 01/10/2016 Document Reviewed: 06/30/2013 Elsevier Interactive Patient Education  2017 Elsevier Inc.  Sinusitis, Adult Sinusitis is soreness and inflammation of your  sinuses. Sinuses are hollow spaces in the bones around your face. Your sinuses are located:  Around your eyes.  In the middle of your forehead.  Behind your nose.  In your cheekbones.  Your sinuses and nasal passages are lined with a stringy fluid (mucus). Mucus normally drains out of your sinuses. When your nasal tissues become inflamed or swollen, the mucus can become trapped or blocked so air cannot flow through your sinuses. This allows bacteria, viruses,  and funguses to grow, which leads to infection. Sinusitis can develop quickly and last for 7?10 days (acute) or for more than 12 weeks (chronic). Sinusitis often develops after a cold. What are the causes? This condition is caused by anything that creates swelling in the sinuses or stops mucus from draining, including:  Allergies.  Asthma.  Bacterial or viral infection.  Abnormally shaped bones between the nasal passages.  Nasal growths that contain mucus (nasal polyps).  Narrow sinus openings.  Pollutants, such as chemicals or irritants in the air.  A foreign object stuck in the nose.  A fungal infection. This is rare.  What increases the risk? The following factors may make you more likely to develop this condition:  Having allergies or asthma.  Having had a recent cold or respiratory tract infection.  Having structural deformities or blockages in your nose or sinuses.  Having a weak immune system.  Doing a lot of swimming or diving.  Overusing nasal sprays.  Smoking.  What are the signs or symptoms? The main symptoms of this condition are pain and a feeling of pressure around the affected sinuses. Other symptoms include:  Upper toothache.  Earache.  Headache.  Bad breath.  Decreased sense of smell and taste.  A cough that may get worse at night.  Fatigue.  Fever.  Thick drainage from your nose. The drainage is often green and it may contain pus (purulent).  Stuffy nose or congestion.  Postnasal drip. This is when extra mucus collects in the throat or back of the nose.  Swelling and warmth over the affected sinuses.  Sore throat.  Sensitivity to light.  How is this diagnosed? This condition is diagnosed based on symptoms, a medical history, and a physical exam. To find out if your condition is acute or chronic, your health care provider may:  Look in your nose for signs of nasal polyps.  Tap over the affected sinus to check for signs of  infection.  View the inside of your sinuses using an imaging device that has a light attached (endoscope).  If your health care provider suspects that you have chronic sinusitis, you may also:  Be tested for allergies.  Have a sample of mucus taken from your nose (nasal culture) and checked for bacteria.  Have a mucus sample examined to see if your sinusitis is related to an allergy.  If your sinusitis does not respond to treatment and it lasts longer than 8 weeks, you may have an MRI or CT scan to check your sinuses. These scans also help to determine how severe your infection is. In rare cases, a bone biopsy may be done to rule out more serious types of fungal sinus disease. How is this treated? Treatment for sinusitis depends on the cause and whether your condition is chronic or acute. If a virus is causing your sinusitis, your symptoms will go away on their own within 10 days. You may be given medicines to relieve your symptoms, including:  Topical nasal decongestants. They shrink swollen  nasal passages and let mucus drain from your sinuses.  Antihistamines. These drugs block inflammation that is triggered by allergies. This can help to ease swelling in your nose and sinuses.  Topical nasal corticosteroids. These are nasal sprays that ease inflammation and swelling in your nose and sinuses.  Nasal saline washes. These rinses can help to get rid of thick mucus in your nose.  If your condition is caused by bacteria, you will be given an antibiotic medicine. If your condition is caused by a fungus, you will be given an antifungal medicine. Surgery may be needed to correct underlying conditions, such as narrow nasal passages. Surgery may also be needed to remove polyps. Follow these instructions at home: Medicines  Take, use, or apply over-the-counter and prescription medicines only as told by your health care provider. These may include nasal sprays.  If you were prescribed an  antibiotic medicine, take it as told by your health care provider. Do not stop taking the antibiotic even if you start to feel better. Hydrate and Humidify  Drink enough water to keep your urine clear or pale yellow. Staying hydrated will help to thin your mucus.  Use a cool mist humidifier to keep the humidity level in your home above 50%.  Inhale steam for 10-15 minutes, 3-4 times a day or as told by your health care provider. You can do this in the bathroom while a hot shower is running.  Limit your exposure to cool or dry air. Rest  Rest as much as possible.  Sleep with your head raised (elevated).  Make sure to get enough sleep each night. General instructions  Apply a warm, moist washcloth to your face 3-4 times a day or as told by your health care provider. This will help with discomfort.  Wash your hands often with soap and water to reduce your exposure to viruses and other germs. If soap and water are not available, use hand sanitizer.  Do not smoke. Avoid being around people who are smoking (secondhand smoke).  Keep all follow-up visits as told by your health care provider. This is important. Contact a health care provider if:  You have a fever.  Your symptoms get worse.  Your symptoms do not improve within 10 days. Get help right away if:  You have a severe headache.  You have persistent vomiting.  You have pain or swelling around your face or eyes.  You have vision problems.  You develop confusion.  Your neck is stiff.  You have trouble breathing. This information is not intended to replace advice given to you by your health care provider. Make sure you discuss any questions you have with your health care provider. Document Released: 02/12/2005 Document Revised: 10/09/2015 Document Reviewed: 12/08/2014 Elsevier Interactive Patient Education  2017 [employer]

## 2016-11-19 ENCOUNTER — Ambulatory Visit (INDEPENDENT_AMBULATORY_CARE_PROVIDER_SITE_OTHER): Payer: Medicare Other | Admitting: Orthopedic Surgery

## 2016-11-19 ENCOUNTER — Encounter (INDEPENDENT_AMBULATORY_CARE_PROVIDER_SITE_OTHER): Payer: Self-pay | Admitting: Orthopedic Surgery

## 2016-11-19 DIAGNOSIS — Z89511 Acquired absence of right leg below knee: Secondary | ICD-10-CM

## 2016-11-19 MED ORDER — NABUMETONE 750 MG PO TABS: 750.0000 mg | ORAL_TABLET | Freq: Two times a day (BID) | ORAL | 3 refills | Status: AC | PRN

## 2016-11-19 MED ORDER — CYCLOBENZAPRINE HCL 10 MG PO TABS: 10.0000 mg | ORAL_TABLET | Freq: Three times a day (TID) | ORAL | 0 refills | Status: DC | PRN

## 2016-11-19 NOTE — Progress Notes (Signed)
Office Visit Note   Patient: Monique [name]           Date of Birth: 01-16-1976           MRN: [id number] Visit Date: 11/19/2016              Requested by: No referring provider defined for this encounter. PCP: Patient, [name]  Chief Complaint  Patient presents with  . Right Leg - Pain      HPI: Patient is a 41 year old woman who presents with loss of residual volume in the right leg with an bearing the prosthesis was developed ulcers medially and laterally on her right transtibial amputation. Patient states she is currently going to treatment for substance abuse.  Assessment & Plan: Visit Diagnoses:  1. S/P unilateral BKA (below knee amputation), right (HCC)     Plan: Patient is given a prescription for biotech for a new socket Her new materials and supplies. She is given a prescription for Relafen and Flexeril. Discussed that narcotics would not be appropriate in light of her substance abuse treatment.  Follow-Up Instructions: Return if symptoms worsen or fail to improve.   Ortho Exam  Patient is alert, oriented, no adenopathy, well-dressed, normal affect, normal respiratory effort. Examination patient has an antalgic gait the right leg is unstable with no torsional stability or varus or valgus stability. Patient has an bearing ulcers medial lateral aspect of her transtibial dictation as well as an ulcer over the tip of the residual tibia. There is no cellulitis no odor no drainage no signs of infection. The ulcers are superficial. Patient complains of persistent muscle spasm in the right leg.  Imaging: No results found. No images are attached to the encounter.  Labs: Lab Results  Component Value Date   HGBA1C 5.5 07/09/2016   HGBA1C 5.6 03/02/2015   ESRSEDRATE 6 07/08/2016   ESRSEDRATE 80 (H) 03/02/2015   ESRSEDRATE 88 (H) 02/16/2015   CRP <0.8 07/08/2016   CRP 1.7 (H) 03/02/2015   CRP 2.0 (H) 02/16/2015   REPTSTATUS 08/20/2016 FINAL 08/15/2016   GRAMSTAIN  12/19/2014    NO WBC SEEN NO SQUAMOUS EPITHELIAL CELLS SEEN NO ORGANISMS SEEN Performed at [facility]    GRAMSTAIN  12/19/2014    NO WBC SEEN NO SQUAMOUS EPITHELIAL CELLS SEEN NO ORGANISMS SEEN Performed at [facility]    CULT NO GROWTH 5 DAYS 08/15/2016   LABORGA STAPHYLOCOCCUS AUREUS 12/19/2014    Orders:  No orders of the defined types were placed in this encounter.  No orders of the defined types were placed in this encounter.    Procedures: No procedures performed  Clinical Data: No additional findings.  ROS:  All other systems negative, except as noted in the HPI. Review of Systems  Objective: Vital Signs: There were no vitals taken for this visit.  Specialty Comments:  No specialty comments available.  PMFS History: Patient Active Problem List   Diagnosis Date Noted  . History of osteomyelitis   . Depression with anxiety   . Hypokalemia 07/08/2016  . Hyponatremia 07/08/2016  . 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 Polysubstance abuse 03/15/2015  . Phantom pain (HCC) 03/15/2015  . Elevated lactic acid level 03/15/2015  . BKA stump complication (HCC)   . Fever   . Osteomyelitis (HCC) 03/02/2015  . Cellulitis 03/02/2015  . Cellulitis and abscess 12/17/2014  . Nicotine abuse 12/17/2014  . Anemia, chronic disease 12/17/2014  . Conjunctivitis, left eye   .  Wound infection after surgery 09/29/2014  . Irritation of left eye 09/29/2014  . Wound, surgical, infected 09/29/2014  . Acute encephalopathy 08/29/2014  . Drug abuse 08/29/2014  . Cocaine abuse 08/29/2014  . Acute respiratory failure with hypoxia (HCC) 08/29/2014   Past Medical History:  Diagnosis Date  . Anemia   . Anxiety   . Arthritis   . Asthma    PHM  . Bipolar disorder (HCC)   . Chronic back pain    "middle and lower" (12/17/2014)  . Depression   . GERD (gastroesophageal reflux disease)   . H/O blood clots   . Infection right  ankle  . Migraine    "weekly" (12/17/2014)  . MRSA infection   . Nerve damage of right foot   . Osteomyelitis of ankle (HCC)    right    Family History  Problem Relation Age of Onset  . Lung cancer Mother   . Cancer Maternal Uncle   . Cancer Maternal Grandfather   . Cancer Paternal Grandfather     Past Surgical History:  Procedure Laterality Date  . AMPUTATION Right 03/04/2015   Procedure: Right Below Knee Amputation;  Surgeon: [name], MD;  Location: [facility] OR;  Service: Orthopedics;  Laterality: Right;  . ANKLE FRACTURE SURGERY Right 82016  . ANKLE FUSION Right 01/19/2015   Procedure: Right Tibiotalar Fusion, Place Antibiotic Beads;  Surgeon: [name], MD;  Location: [facility] OR;  Service: Orthopedics;  Laterality: Right;  . DILATION AND CURETTAGE OF UTERUS  X 2  . EXTERNAL FIXATION LEG Right 01/19/2015   Procedure: EXTERNAL FIXATION Right Ankle;  Surgeon: [name], MD;  Location: [facility] OR;  Service: Orthopedics;  Laterality: Right;  . EXTERNAL FIXATION REMOVAL Right 03/04/2015   Procedure: REMOVAL EXTERNAL FIXATION Right Lower Leg;  Surgeon: [name], MD;  Location: [facility] OR;  Service: Orthopedics;  Laterality: Right;  . FRACTURE SURGERY    . HARDWARE REMOVAL Right 09/02/2014   Procedure: Removal Right Lateral Ankle Hardware, Place Antibiotic Bead ;  Surgeon: [name], MD;  Location: [facility] ORS;  Service: Orthopedics;  Laterality: Right;  . INCISION AND DRAINAGE ABSCESS Right 12/19/2014   Procedure: INCISION AND DRAINAGE OF ABSCESS ON RIGHT SIDE OF CHIN;  Surgeon: [name], MD;  Location: [facility] OR;  Service: ENT;  Laterality: Right;  . STUMP REVISION Right 04/14/2015   Procedure: RIGHT BELOW KNEE AMPUTATION REVISION;  Surgeon: [name], MD;  Location: [facility] OR;  Service: Orthopedics;  Laterality: Right;  . STUMP REVISION Right 07/11/2016   Procedure: Revision Right Below Knee Amputation, Fibula;  Surgeon: [name], MD;  Location: [facility] OR;  Service: Orthopedics;  Laterality:  Right;  . TUBAL LIGATION     Social History   Occupational History  . Not on file.   Social History Main Topics  . Smoking status: Current Every Day Smoker    Packs/day: 1.00    Years: 25.00    Types: Cigarettes  . Smokeless tobacco: Never Used  . Alcohol use Yes     Comment:  " 7 months ago " 01/18/15  . Drug use: Yes    Types: Cocaine, Marijuana, Benzodiazepines     Comment: drug screen +  benzos,cocaine, marijuana pt denies using ( denies curent use 12/17/14  . Sexual activity: Yes    Birth control/ protection: None, Surgical

## 2016-11-26 ENCOUNTER — Telehealth (INDEPENDENT_AMBULATORY_CARE_PROVIDER_SITE_OTHER): Payer: Self-pay

## 2016-11-26 NOTE — Telephone Encounter (Signed)
Left vm stating letter at front desk for pick up.

## 2016-11-26 NOTE — Telephone Encounter (Signed)
Patient would like to have a note/letter stating that she is eligible to donate plasma.  Cb# is [phone].  Please advise.  Thank You.

## 2016-12-23 ENCOUNTER — Other Ambulatory Visit (INDEPENDENT_AMBULATORY_CARE_PROVIDER_SITE_OTHER): Payer: Self-pay | Admitting: Orthopedic Surgery

## 2017-01-11 ENCOUNTER — Other Ambulatory Visit (HOSPITAL_COMMUNITY): Payer: Self-pay | Admitting: Nurse Practitioner

## 2017-01-11 ENCOUNTER — Other Ambulatory Visit: Payer: Self-pay | Admitting: Nurse Practitioner

## 2017-01-11 DIAGNOSIS — R079 Chest pain, unspecified: Secondary | ICD-10-CM

## 2017-01-11 DIAGNOSIS — Z139 Encounter for screening, unspecified: Secondary | ICD-10-CM

## 2017-01-14 ENCOUNTER — Ambulatory Visit (HOSPITAL_COMMUNITY)
Admission: RE | Admit: 2017-01-14 | Discharge: 2017-01-14 | Disposition: A | Discharge status: Home / Self Care | Payer: Medicare Other | Source: Ambulatory Visit | Attending: Nurse Practitioner | Admitting: Nurse Practitioner

## 2017-01-14 DIAGNOSIS — R079 Chest pain, unspecified: Secondary | ICD-10-CM

## 2017-01-23 ENCOUNTER — Telehealth (INDEPENDENT_AMBULATORY_CARE_PROVIDER_SITE_OTHER): Payer: Self-pay | Admitting: Orthopedic Surgery

## 2017-01-23 NOTE — Telephone Encounter (Signed)
Ov notes to [employer] to support need for prosthesis

## 2017-01-24 NOTE — Telephone Encounter (Signed)
Faxed office note to [employer], stating why prosthetic was needed due to increase volume loss of residual limb.

## 2017-01-24 NOTE — Telephone Encounter (Signed)
Recktenwald,Dineen E  [birth year]        Pt called needs note for [employer] explaining  why she needs prosthesis.

## 2017-01-25 ENCOUNTER — Other Ambulatory Visit (HOSPITAL_COMMUNITY): Payer: Medicare Other

## 2017-01-29 ENCOUNTER — Ambulatory Visit (HOSPITAL_COMMUNITY)
Admission: RE | Admit: 2017-01-29 | Discharge: 2017-01-29 | Disposition: A | Discharge status: Home / Self Care | Payer: Medicare Other | Source: Ambulatory Visit | Attending: Nurse Practitioner | Admitting: Nurse Practitioner

## 2017-01-29 ENCOUNTER — Encounter (HOSPITAL_COMMUNITY): Payer: Self-pay | Admitting: Nurse Practitioner

## 2017-01-29 ENCOUNTER — Other Ambulatory Visit (HOSPITAL_COMMUNITY): Payer: Self-pay | Admitting: Nurse Practitioner

## 2017-01-29 DIAGNOSIS — R079 Chest pain, unspecified: Secondary | ICD-10-CM | POA: Insufficient documentation

## 2017-02-08 ENCOUNTER — Ambulatory Visit
Admission: RE | Admit: 2017-02-08 | Discharge: 2017-02-08 | Disposition: A | Discharge status: Home / Self Care | Payer: Medicare Other | Source: Ambulatory Visit | Attending: Nurse Practitioner | Admitting: Nurse Practitioner

## 2017-02-08 ENCOUNTER — Ambulatory Visit: Payer: Self-pay

## 2017-02-08 DIAGNOSIS — Z139 Encounter for screening, unspecified: Secondary | ICD-10-CM

## 2017-02-11 ENCOUNTER — Other Ambulatory Visit: Payer: Self-pay | Admitting: Nurse Practitioner

## 2017-02-11 DIAGNOSIS — R928 Other abnormal and inconclusive findings on diagnostic imaging of breast: Secondary | ICD-10-CM

## 2017-02-28 ENCOUNTER — Other Ambulatory Visit: Payer: Self-pay

## 2017-03-04 ENCOUNTER — Encounter: Payer: Self-pay | Admitting: Advanced Practice Midwife

## 2017-03-13 ENCOUNTER — Encounter: Payer: Self-pay | Admitting: Physical Therapy

## 2017-03-13 ENCOUNTER — Encounter: Payer: Self-pay | Admitting: *Deleted

## 2017-03-13 NOTE — Therapy (Signed)
[facility] [address] [place], [place], [address] Phone: [phone]   Fax:  [phone]  Patient Details  Name: Monique [name] MRN: [id number] Date of Birth: Feb 24, 1976 Referring Provider:  Meridee Score, MD  Encounter Date: 03/13/2017   PHYSICAL THERAPY DISCHARGE SUMMARY  Visits from Start of Care: 2  Current functional level related to goals / functional outcomes: Patient developed a wound and did not complete course of care for PT.   Remaining deficits: unknown   Education / Equipment: Initial prosthetic care  Plan: Patient agrees to discharge.  Patient goals were not met. Patient is being discharged due to a change in medical status.  ?????         Siri Buege PT, DPT 03/13/2017, 12:19 PM  [facility] [address] [place], [place], [address] Phone: [phone]   Fax:  [phone]

## 2017-03-20 ENCOUNTER — Other Ambulatory Visit: Payer: Self-pay | Admitting: Adult Health Nurse Practitioner

## 2017-03-20 DIAGNOSIS — R928 Other abnormal and inconclusive findings on diagnostic imaging of breast: Secondary | ICD-10-CM

## 2017-04-04 ENCOUNTER — Other Ambulatory Visit: Payer: Self-pay

## 2017-05-31 IMAGING — DX DG ANKLE COMPLETE 3+V*R*
3 series · 3 of 3 positions shown · non-contrast
Comparison: December 31, 2014.

CLINICAL DATA: Right ankle pain after falling.

EXAM:
RIGHT ANKLE - COMPLETE 3+ VIEW

[ankle ap]
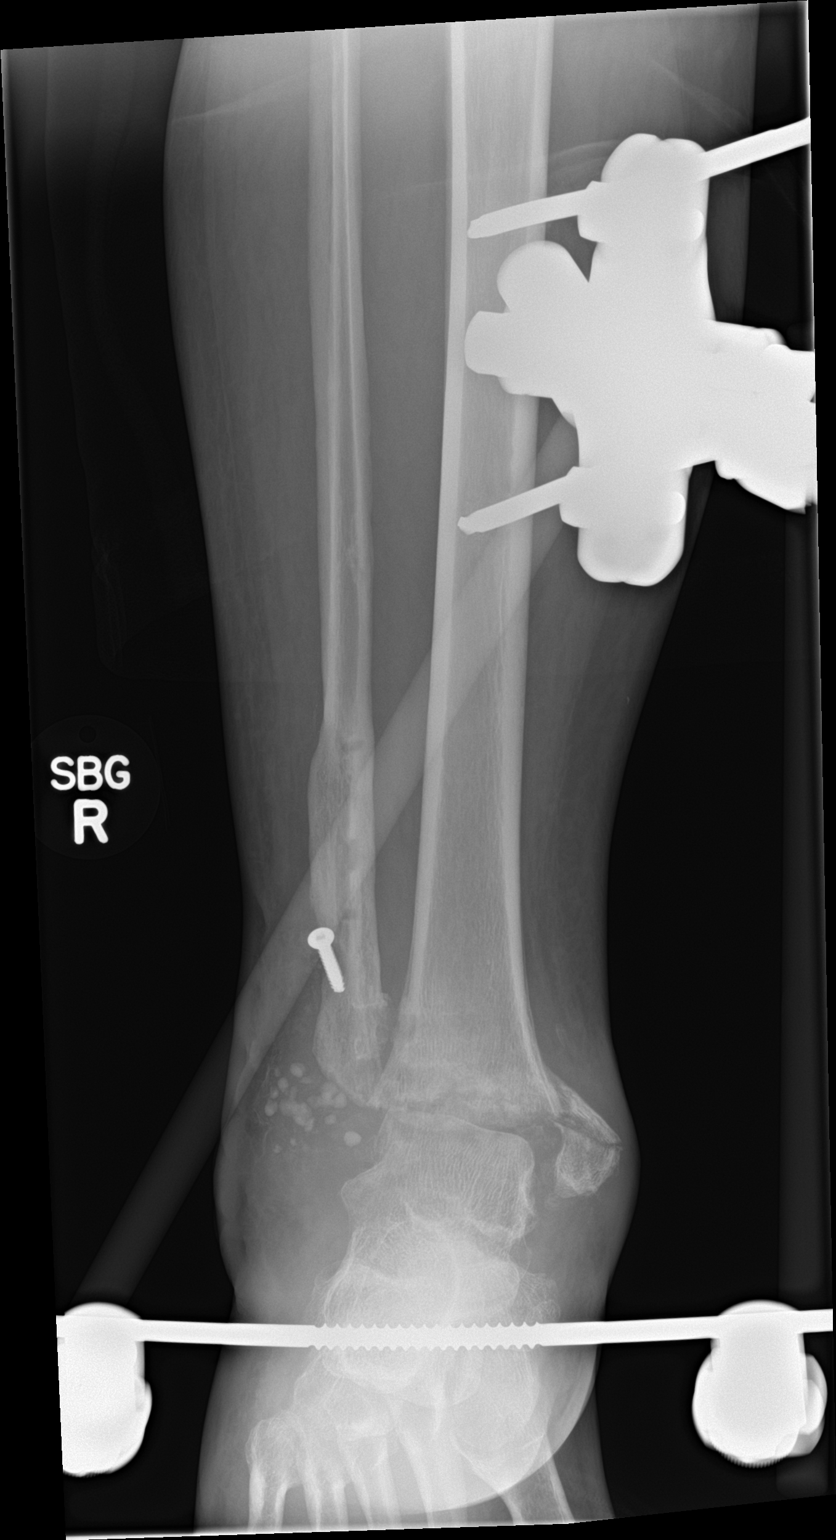

[ankle obl]
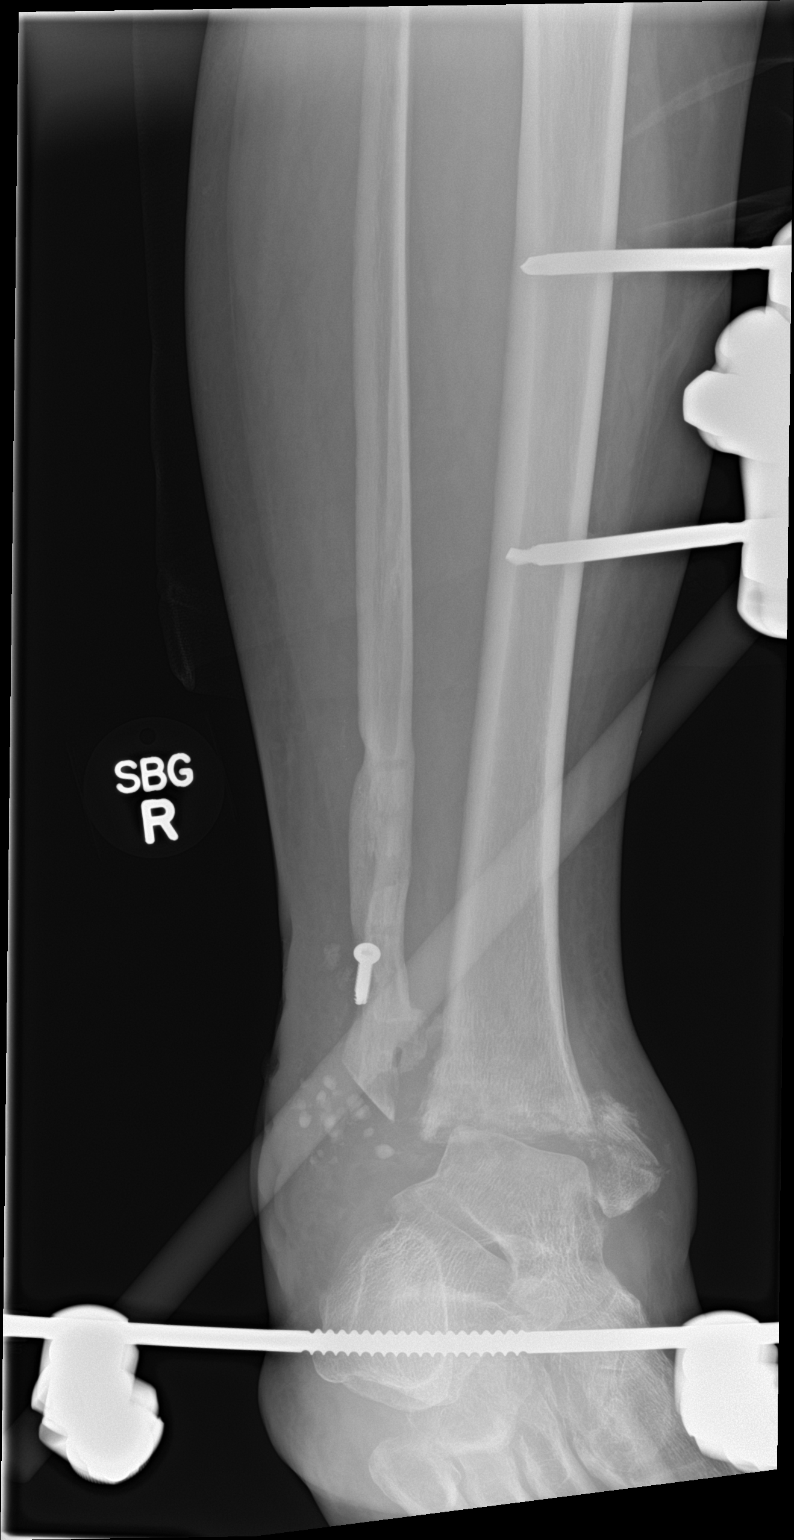

[ankle lat]
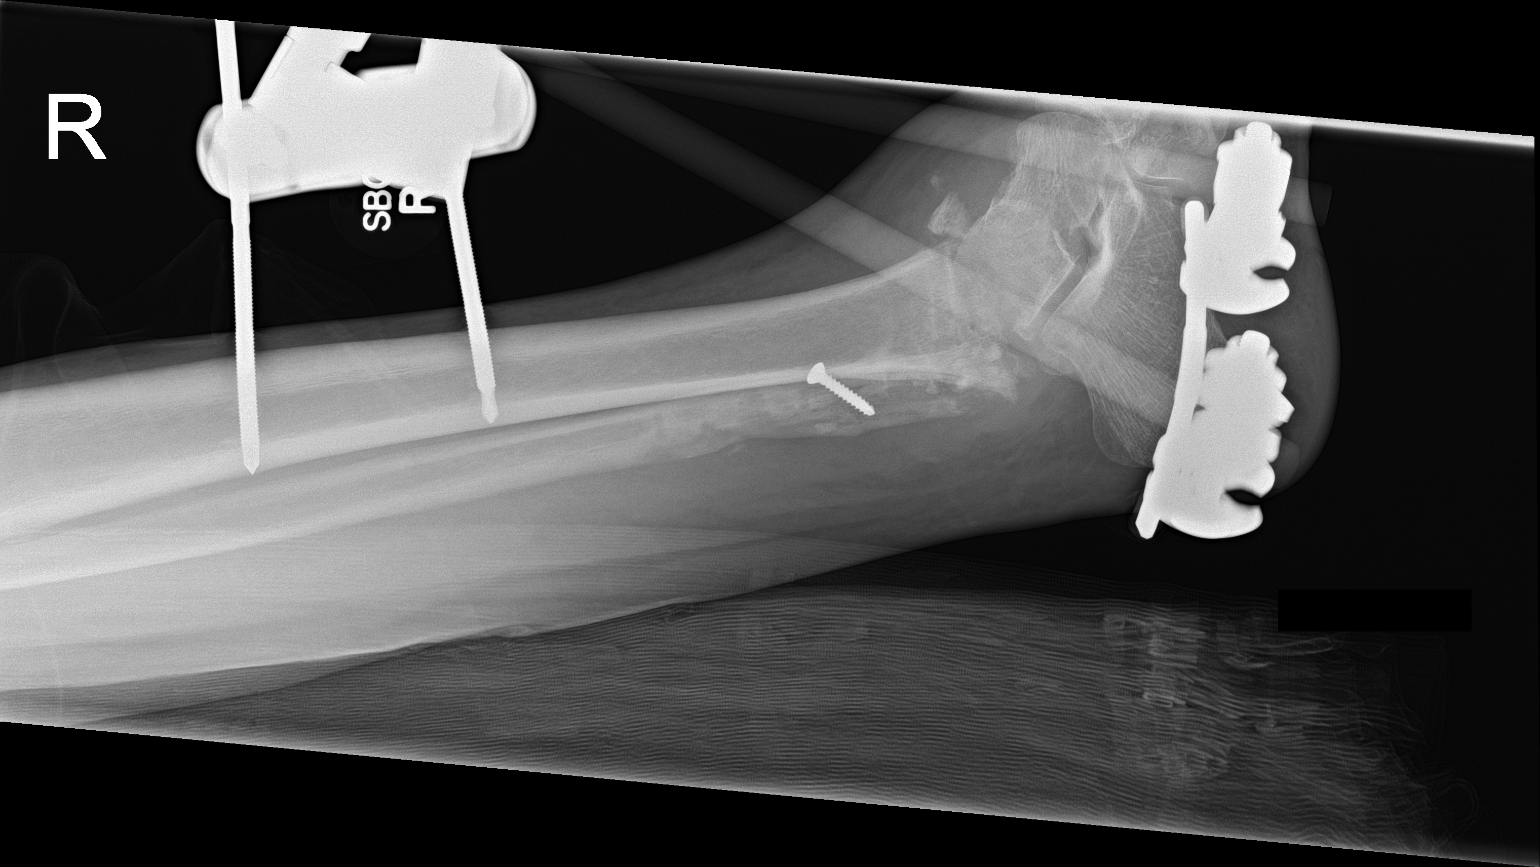

[3 of 3 positions shown; findings below may reference images not displayed]

FINDINGS: Status post internal fixation of of right lower leg, with proximal
screws seen in the mid tibial shaft, and distal screw passing
through calcaneus. Severely comminuted fracture of the distal right
tibia is noted. There has been surgical resection of the distal
fibula. Old healed fracture of distal right fibula is noted.
IMPRESSION: Status post internal fixation of severely comminuted distal right
tibial fracture. Status post surgical resection of distal fibula.

## 2017-06-01 ENCOUNTER — Emergency Department (HOSPITAL_COMMUNITY): Payer: Medicare Other

## 2017-06-01 ENCOUNTER — Emergency Department (HOSPITAL_COMMUNITY)
Admission: EM | Admit: 2017-06-01 | Discharge: 2017-06-02 | Disposition: A | Discharge status: Home / Self Care | Payer: Medicare Other | Attending: Emergency Medicine | Admitting: Emergency Medicine

## 2017-06-01 ENCOUNTER — Other Ambulatory Visit: Payer: Self-pay

## 2017-06-01 ENCOUNTER — Encounter (HOSPITAL_COMMUNITY): Payer: Self-pay | Admitting: *Deleted

## 2017-06-01 DIAGNOSIS — F419 Anxiety disorder, unspecified: Secondary | ICD-10-CM | POA: Diagnosis not present

## 2017-06-01 DIAGNOSIS — R4182 Altered mental status, unspecified: Secondary | ICD-10-CM | POA: Insufficient documentation

## 2017-06-01 DIAGNOSIS — F319 Bipolar disorder, unspecified: Secondary | ICD-10-CM | POA: Insufficient documentation

## 2017-06-01 DIAGNOSIS — J45909 Unspecified asthma, uncomplicated: Secondary | ICD-10-CM | POA: Insufficient documentation

## 2017-06-01 DIAGNOSIS — F17210 Nicotine dependence, cigarettes, uncomplicated: Secondary | ICD-10-CM | POA: Diagnosis not present

## 2017-06-01 DIAGNOSIS — Z79899 Other long term (current) drug therapy: Secondary | ICD-10-CM | POA: Insufficient documentation

## 2017-06-01 DIAGNOSIS — F10929 Alcohol use, unspecified with intoxication, unspecified: Secondary | ICD-10-CM | POA: Diagnosis present

## 2017-06-01 DIAGNOSIS — F1910 Other psychoactive substance abuse, uncomplicated: Secondary | ICD-10-CM | POA: Diagnosis not present

## 2017-06-01 LAB — HEPATIC FUNCTION PANEL
ALBUMIN: 3.5 g/dL (ref 3.5–5.0)
ALT: 21 U/L (ref 14–54)
AST: 30 U/L (ref 15–41)
Alkaline Phosphatase: 58 U/L (ref 38–126)
Bilirubin, Direct: 0.1 mg/dL (ref 0.1–0.5)
Indirect Bilirubin: 0.4 mg/dL (ref 0.3–0.9)
TOTAL PROTEIN: 7.2 g/dL (ref 6.5–8.1)
Total Bilirubin: 0.5 mg/dL (ref 0.3–1.2)

## 2017-06-01 LAB — BASIC METABOLIC PANEL
ANION GAP: 9 (ref 5–15)
BUN: 12 mg/dL (ref 6–20)
CO2: 26 mmol/L (ref 22–32)
Calcium: 9.3 mg/dL (ref 8.9–10.3)
Chloride: 108 mmol/L (ref 101–111)
Creatinine, Ser: 0.74 mg/dL (ref 0.44–1.00)
GFR calc Af Amer: >60 mL/min (ref >60)
GFR calc non Af Amer: >60 mL/min (ref >60)
GLUCOSE: 131 mg/dL — AB (ref 65–99)
POTASSIUM: 3.3 mmol/L — AB (ref 3.5–5.1)
Sodium: 143 mmol/L (ref 135–145)

## 2017-06-01 LAB — CBC
HEMATOCRIT: 33.5 % — AB (ref 36.0–46.0)
HEMOGLOBIN: 10.1 g/dL — AB (ref 12.0–15.0)
MCH: 23.7 pg — ABNORMAL LOW (ref 26.0–34.0)
MCHC: 30.1 g/dL (ref 30.0–36.0)
MCV: 78.5 fL (ref 78.0–100.0)
Platelets: 475 10*3/uL — ABNORMAL HIGH (ref 150–400)
RBC: 4.27 MIL/uL (ref 3.87–5.11)
RDW: 15.5 % (ref 11.5–15.5)
WBC: 8.8 10*3/uL (ref 4.0–10.5)

## 2017-06-01 LAB — ETHANOL: Alcohol, Ethyl (B): <10 mg/dL (ref <10)

## 2017-06-01 LAB — RAPID URINE DRUG SCREEN, HOSP PERFORMED
AMPHETAMINES: POSITIVE — AB
BARBITURATES: NOT DETECTED
BENZODIAZEPINES: POSITIVE — AB
Cocaine: POSITIVE — AB
Opiates: NOT DETECTED
Tetrahydrocannabinol: POSITIVE — AB

## 2017-06-01 LAB — SALICYLATE LEVEL: Salicylate Lvl: <7 mg/dL (ref 2.8–30.0)

## 2017-06-01 LAB — ACETAMINOPHEN LEVEL: Acetaminophen (Tylenol), Serum: <10 ug/mL — ABNORMAL LOW (ref 10–30)

## 2017-06-01 MED ORDER — POTASSIUM CHLORIDE CRYS ER 20 MEQ PO TBCR
40.0000 meq | EXTENDED_RELEASE_TABLET | Freq: Once | ORAL | Status: DC
  Filled 2017-06-01: qty 2

## 2017-06-01 NOTE — ED Triage Notes (Signed)
Pt picked up near [address] after bystanders called. EMS sts that pt was falling around on ground, trying to climb over a fence and attempting to crawl under a car. Pt admits to EMS that she has been drinking today, but denies drug use. Pt is lethargic and only responds to loud verbal stimulation or painful stimuli. Pt has slurred speech but is in NAD. Dr Denton LankSteinl at bedside

## 2017-06-01 NOTE — ED Notes (Signed)
Bed: [id number] Expected date:  Expected time:  Means of arrival:  Comments: ETOH Fall

## 2017-06-01 NOTE — Discharge Instructions (Addendum)
It was our pleasure to provide your ER care today - we hope that you feel better.  Do not use alcohol and/or drugs. Follow up with alcohol/substance abuse treatment, AA, and use resource guide provided for additional community resources.  No driving for the next 10 hours or any time when abusing drugs or alcohol.   From today's labs, your potassium level is mildly low - eat plenty of fruits and vegetables, and follow up with primary care doctor.   Also follow up with primary care doctor this week.   Return to ER if worse, new symptoms, fevers, new or severe pain, trouble breathing, other concern.

## 2017-06-01 NOTE — ED Notes (Signed)
Pt offered water, pt refused.

## 2017-06-01 NOTE — ED Notes (Signed)
Patient is homeless

## 2017-06-01 NOTE — ED Notes (Signed)
Patient does not have anyone to come get her. Patient states the only way to get home is for her to walk.

## 2017-06-01 NOTE — ED Provider Notes (Signed)
[facility]-EMERGENCY DEPT Provider Note   CSN: [id number] Arrival date & time: 06/01/17  1733     History   Chief Complaint Chief Complaint  Patient presents with  . Alcohol Intoxication  . Fall    HPI Monique [name] is a 42 y.o. female.  Patient presents via ems with suspected intoxication. Hx polysubstance abuse. Patient very poor historian - level 5 caveat. Per report, pt was acting strange, crawling on ground, and under car.   The history is provided by the patient and the EMS personnel. The history is limited by the condition of the patient.  Alcohol Intoxication   Fall     Past Medical History:  Diagnosis Date  . Anemia   . Anxiety   . Arthritis   . Asthma    PHM  . Bipolar disorder (HCC)   . Chronic back pain    "middle and lower" (12/17/2014)  . Depression   . GERD (gastroesophageal reflux disease)   . H/O blood clots   . Infection right ankle  . Migraine    "weekly" (12/17/2014)  . MRSA infection   . Nerve damage of right foot   . Osteomyelitis of ankle [facility])    right    Patient Active Problem List   Diagnosis Date Noted  . History of osteomyelitis   . Depression with anxiety   . Hypokalemia 07/08/2016  . Hyponatremia 07/08/2016  . 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 Polysubstance abuse (HCC) 03/15/2015  . Phantom pain (HCC) 03/15/2015  . Elevated lactic acid level 03/15/2015  . BKA stump complication (HCC)   . Fever   . Osteomyelitis (HCC) 03/02/2015  . Cellulitis 03/02/2015  . Cellulitis and abscess 12/17/2014  . Nicotine abuse 12/17/2014  . Anemia, chronic disease 12/17/2014  . Conjunctivitis, left eye   . Wound infection after surgery 09/29/2014  . Irritation of left eye 09/29/2014  . Wound, surgical, infected 09/29/2014  . Acute encephalopathy 08/29/2014  . Drug abuse (HCC) 08/29/2014  . Cocaine abuse (HCC) 08/29/2014  . Acute respiratory failure with hypoxia (HCC) 08/29/2014     Past Surgical History:  Procedure Laterality Date  . AMPUTATION Right 03/04/2015   Procedure: Right Below Knee Amputation;  Surgeon: [name], MD;  Location: [facility] OR;  Service: Orthopedics;  Laterality: Right;  . ANKLE FRACTURE SURGERY Right 82016  . ANKLE FUSION Right 01/19/2015   Procedure: Right Tibiotalar Fusion, Place Antibiotic Beads;  Surgeon: [name], MD;  Location: [facility] OR;  Service: Orthopedics;  Laterality: Right;  . DILATION AND CURETTAGE OF UTERUS  X 2  . EXTERNAL FIXATION LEG Right 01/19/2015   Procedure: EXTERNAL FIXATION Right Ankle;  Surgeon: [name], MD;  Location: [facility] OR;  Service: Orthopedics;  Laterality: Right;  . EXTERNAL FIXATION REMOVAL Right 03/04/2015   Procedure: REMOVAL EXTERNAL FIXATION Right Lower Leg;  Surgeon: [name], MD;  Location: [facility] OR;  Service: Orthopedics;  Laterality: Right;  . FRACTURE SURGERY    . HARDWARE REMOVAL Right 09/02/2014   Procedure: Removal Right Lateral Ankle Hardware, Place Antibiotic Bead ;  Surgeon: [name], MD;  Location: [facility] ORS;  Service: Orthopedics;  Laterality: Right;  . INCISION AND DRAINAGE ABSCESS Right 12/19/2014   Procedure: INCISION AND DRAINAGE OF ABSCESS ON RIGHT SIDE OF CHIN;  Surgeon: [name], MD;  Location: [facility] OR;  Service: ENT;  Laterality: Right;  . STUMP REVISION Right 04/14/2015   Procedure: RIGHT BELOW KNEE AMPUTATION  REVISION;  Surgeon: Nadara MustardMarcus Duda V, MD;  [facility];  Service: Orthopedics;  Laterality: Right;  . STUMP REVISION Right 07/11/2016   Procedure: Revision Right Below Knee Amputation, Fibula;  Surgeon: Nadara Mustarduda, Marcus V, MD;  [facility];  Service: Orthopedics;  Laterality: Right;  . TUBAL LIGATION       OB History   None      Home Medications    Prior to Admission medications   Medication Sig Start Date End Date Taking? Authorizing Provider  ALPRAZolam Prudy Feeler(XANAX) 0.5 MG tablet Take 1 tablet (0.5 mg total) by mouth 2 (two) times daily as needed for anxiety.  01/18/16   Nadara Mustarduda, Marcus V, MD  ALPRAZolam Prudy Feeler(XANAX) 1 MG tablet Take 1 mg by mouth 3 (three) times daily.    [provider]  amoxicillin-clavulanate (AUGMENTIN) 875-125 MG tablet Take 1 tablet by mouth 2 (two) times daily. 11/08/16   Doristine BosworthStallings, Zoe A, MD  buPROPion (WELLBUTRIN SR) 100 MG 12 hr tablet Take 1 tablet (100 mg total) by mouth 2 (two) times daily. 03/24/15   Henrietta HooverBernhardt, Linda C, NP  cyclobenzaprine (FLEXERIL) 10 MG tablet TAKE 1 TABLET BY MOUTH THREE TIMES DAILY AS NEEDED FOR MUSCLE SPASMS 12/24/16   Nadara Mustarduda, Marcus V, MD  docusate sodium (COLACE) 100 MG capsule Take 1 capsule (100 mg total) by mouth 2 (two) times daily. 07/13/16   Rodolph Bonghompson, Daniel V, MD  ferrous sulfate 325 (65 FE) MG tablet Take 1 tablet (325 mg total) by mouth daily with breakfast. 03/28/15   Henrietta HooverBernhardt, Linda C, NP  gabapentin (NEURONTIN) 300 MG capsule Take 1 capsule (300 mg total) by mouth 3 (three) times daily. 03/24/15   Henrietta HooverBernhardt, Linda C, NP  ketorolac (TORADOL) 10 MG tablet Take 1 tablet (10 mg total) by mouth every 6 (six) hours as needed. 07/30/16   Nadara Mustarduda, Marcus V, MD  meloxicam (MOBIC) 7.5 MG tablet Take 1 tablet (7.5 mg total) by mouth daily. 08/03/16 08/03/17  Adonis HugueninZamora, Erin R, NP  methocarbamol (ROBAXIN) 500 MG tablet Take 1 tablet (500 mg total) by mouth every 6 (six) hours as needed for muscle spasms. 03/24/15   Henrietta HooverBernhardt, Linda C, NP  naltrexone (DEPADE) 50 MG tablet Take 25 mg by mouth daily.    [provider]  ondansetron (ZOFRAN) 4 MG tablet Take 1 tablet (4 mg total) by mouth every 8 (eight) hours as needed for nausea or vomiting. 07/21/16   Devoria AlbeKnapp, Iva, MD  oxyCODONE-acetaminophen (PERCOCET) 10-325 MG tablet Take 1 tablet by mouth every 8 (eight) hours as needed for pain. 07/20/16   Adonis HugueninZamora, Erin R, NP  pantoprazole (PROTONIX) 40 MG tablet Take 1 tablet (40 mg total) by mouth daily at 6 (six) AM. 07/14/16   Rodolph Bonghompson, Daniel V, MD  polyethylene glycol [facility](MIRALAX / Ethelene HalGLYCOLAX) packet Take 17 g by mouth daily as  needed for mild constipation. 07/13/16   Rodolph Bonghompson, Daniel V, MD  senna-docusate (SENOKOT-S) 8.6-50 MG tablet Take 1 tablet by mouth at bedtime as needed for mild constipation. 03/06/15   Richarda OverlieAbrol, Nayana, MD  silver sulfADIAZINE (SILVADENE) 1 % cream Apply 1 application topically daily. 08/02/16   Nadara Mustarduda, Marcus V, MD    Family History Family History  Problem Relation Age of Onset  . Lung cancer Mother   . Cancer Maternal Uncle   . Cancer Maternal Grandfather   . Cancer Paternal Grandfather     Social History Social History   Tobacco Use  . Smoking status: Current Every Day Smoker    Packs/day:  1.00    Years: 25.00    Pack years: 25.00    Types: Cigarettes  . Smokeless tobacco: Never Used  Substance Use Topics  . Alcohol use: Yes    Comment:  " 7 months ago " 01/18/15  . Drug use: Yes    Types: Cocaine, Marijuana, Benzodiazepines    Comment: drug screen +  benzos,cocaine, marijuana pt denies using ( denies curent use 12/17/14     Allergies   No known allergies   Review of Systems Review of Systems  Unable to perform ROS: Mental status change  level 5 caveat - ?substance abuse/uncooperative.      Physical Exam Updated Vital Signs BP 111/62 (BP Location: Left Arm)   Pulse (!) 103   Temp 99.2 F (37.3 C) (Oral)   Resp (!) 23   LMP  (LMP Unknown)   SpO2 96%   Physical Exam  Constitutional: She appears well-developed and well-nourished. No distress.  HENT:  Head: Atraumatic.  Mouth/Throat: Oropharynx is clear and moist.  Eyes: Pupils are equal, round, and reactive to light. Conjunctivae are normal. No scleral icterus.  Neck: Normal range of motion. Neck supple. No tracheal deviation present.  Cardiovascular: Regular rhythm, normal heart sounds and intact distal pulses. Exam reveals no friction rub.  No murmur heard. Pulmonary/Chest: Effort normal and breath sounds normal. No respiratory distress.  Abdominal: Soft. Normal appearance and bowel sounds are normal. She  exhibits no distension and no mass. There is no tenderness. There is no guarding.  Genitourinary:  Genitourinary Comments: No cva tenderness  Musculoskeletal: She exhibits no edema.  Neurological:  Pt slow to respond. Is arousable. Moves bil extremities purposefully with good strength. Uncooperative w exam.   Skin: Skin is warm and dry. No rash noted. She is not diaphoretic.  Psychiatric: She has a normal mood and affect.  Nursing note and vitals reviewed.    ED Treatments / Results  Labs (all labs ordered are listed, but only abnormal results are displayed) Results for orders placed or performed during the hospital encounter of 06/01/17  Ethanol  Result Value Ref Range   Alcohol, Ethyl (B) <10 <10 mg/dL  Basic metabolic panel  Result Value Ref Range   Sodium 143 135 - 145 mmol/L   Potassium 3.3 (L) 3.5 - 5.1 mmol/L   Chloride 108 101 - 111 mmol/L   CO2 26 22 - 32 mmol/L   Glucose, Bld 131 (H) 65 - 99 mg/dL   BUN 12 6 - 20 mg/dL   Creatinine, Ser 4.09 0.44 - 1.00 mg/dL   Calcium 9.3 8.9 - 81.1 mg/dL   GFR calc non Af Amer >60 >60 mL/min   GFR calc Af Amer >60 >60 mL/min   Anion gap 9 5 - 15  CBC  Result Value Ref Range   WBC 8.8 4.0 - 10.5 K/uL   RBC 4.27 3.87 - 5.11 MIL/uL   Hemoglobin 10.1 (L) 12.0 - 15.0 g/dL   HCT 91.4 (L) 78.2 - 95.6 %   MCV 78.5 78.0 - 100.0 fL   MCH 23.7 (L) 26.0 - 34.0 pg   MCHC 30.1 30.0 - 36.0 g/dL   RDW 21.3 08.6 - 57.8 %   Platelets 475 (H) 150 - 400 K/uL  Acetaminophen level  Result Value Ref Range   Acetaminophen (Tylenol), Serum <10 (L) 10 - 30 ug/mL  Salicylate level  Result Value Ref Range   Salicylate Lvl <7.0 2.8 - 30.0 mg/dL  Rapid urine drug screen (hospital performed)  Result Value Ref Range   Opiates NONE DETECTED NONE DETECTED   Cocaine POSITIVE (A) NONE DETECTED   Benzodiazepines POSITIVE (A) NONE DETECTED   Amphetamines POSITIVE (A) NONE DETECTED   Tetrahydrocannabinol POSITIVE (A) NONE DETECTED   Barbiturates NONE  DETECTED NONE DETECTED  Hepatic function panel  Result Value Ref Range   Total Protein 7.2 6.5 - 8.1 g/dL   Albumin 3.5 3.5 - 5.0 g/dL   AST 30 15 - 41 U/L   ALT 21 14 - 54 U/L   Alkaline Phosphatase 58 38 - 126 U/L   Total Bilirubin 0.5 0.3 - 1.2 mg/dL   Bilirubin, Direct 0.1 0.1 - 0.5 mg/dL   Indirect Bilirubin 0.4 0.3 - 0.9 mg/dL   Dg Chest 2 View  Result Date: 06/01/2017 CLINICAL DATA:  Cough EXAM: CHEST - 2 VIEW COMPARISON:  01/29/2017 FINDINGS: Cardiac shadow is mildly prominent but accentuated by the portable technique. Poor inspiratory effort is noted with crowding of vascular markings although mild vascular congestion is suggested. No focal infiltrate is seen. No bony abnormality is noted. IMPRESSION: Poor inspiratory effort with crowding of the vascular markings although a mild degree of vascular congestion is suspected. Electronically Signed   By: Alcide Clever M.D.   On: 06/01/2017 19:36   Ct Head Wo Contrast  Result Date: 06/01/2017 CLINICAL DATA:  Mental status changes.  Status post fall. EXAM: CT HEAD WITHOUT CONTRAST CT CERVICAL SPINE WITHOUT CONTRAST TECHNIQUE: Multidetector CT imaging of the head and cervical spine was performed following the standard protocol without intravenous contrast. Multiplanar CT image reconstructions of the cervical spine were also generated. COMPARISON:  Head CT 08/29/2014.  Cervical spine CT 05/25/2012. FINDINGS: CT HEAD FINDINGS Brain: There is no evidence for acute hemorrhage, hydrocephalus, mass lesion, or abnormal extra-axial fluid collection. No definite CT evidence for acute infarction. Vascular: No hyperdense vessel or unexpected calcification. Skull: No evidence for fracture. No worrisome lytic or sclerotic lesion. Sinuses/Orbits: The visualized paranasal sinuses and mastoid air cells are clear. Visualized portions of the globes and intraorbital fat are unremarkable. Other: None. CT CERVICAL SPINE FINDINGS Study degraded by motion and large body  habitus. Alignment: Straightening of normal cervical lordosis evident. Skull base and vertebrae: No acute fracture. No primary bone lesion or focal pathologic process. Soft tissues and spinal canal: No prevertebral fluid or swelling. No visible canal hematoma. Disc levels:  Preserved. Upper chest: Patchy ground-glass attenuation identified in both upper lobes with 10 mm nodular component anterior right upper lobe (image 93/series 8). Other: None. IMPRESSION: 1. No acute intracranial abnormality. 2. Motion degraded exam of the cervical spine shows no fracture or subluxation. 3. Patchy ground-glass attenuation identified in both lung apices including a 10 mm nodular component in the right lung apex. Imaging features could be infectious/inflammatory or related to pulmonary edema. CT chest without contrast could be used to further evaluate as clinically warranted. Electronically Signed   By: Kennith Center M.D.   On: 06/01/2017 18:24   Ct Cervical Spine Wo Contrast  Result Date: 06/01/2017 CLINICAL DATA:  Mental status changes.  Status post fall. EXAM: CT HEAD WITHOUT CONTRAST CT CERVICAL SPINE WITHOUT CONTRAST TECHNIQUE: Multidetector CT imaging of the head and cervical spine was performed following the standard protocol without intravenous contrast. Multiplanar CT image reconstructions of the cervical spine were also generated. COMPARISON:  Head CT 08/29/2014.  Cervical spine CT 05/25/2012. FINDINGS: CT HEAD FINDINGS Brain: There is no evidence for acute hemorrhage, hydrocephalus, mass lesion, or abnormal extra-axial fluid collection.  No definite CT evidence for acute infarction. Vascular: No hyperdense vessel or unexpected calcification. Skull: No evidence for fracture. No worrisome lytic or sclerotic lesion. Sinuses/Orbits: The visualized paranasal sinuses and mastoid air cells are clear. Visualized portions of the globes and intraorbital fat are unremarkable. Other: None. CT CERVICAL SPINE FINDINGS Study degraded  by motion and large body habitus. Alignment: Straightening of normal cervical lordosis evident. Skull base and vertebrae: No acute fracture. No primary bone lesion or focal pathologic process. Soft tissues and spinal canal: No prevertebral fluid or swelling. No visible canal hematoma. Disc levels:  Preserved. Upper chest: Patchy ground-glass attenuation identified in both upper lobes with 10 mm nodular component anterior right upper lobe (image 93/series 8). Other: None. IMPRESSION: 1. No acute intracranial abnormality. 2. Motion degraded exam of the cervical spine shows no fracture or subluxation. 3. Patchy ground-glass attenuation identified in both lung apices including a 10 mm nodular component in the right lung apex. Imaging features could be infectious/inflammatory or related to pulmonary edema. CT chest without contrast could be used to further evaluate as clinically warranted. Electronically Signed   By: Kennith Center M.D.   On: 06/01/2017 18:24    EKG None  Radiology Dg Chest 2 View  Result Date: 06/01/2017 CLINICAL DATA:  Cough EXAM: CHEST - 2 VIEW COMPARISON:  01/29/2017 FINDINGS: Cardiac shadow is mildly prominent but accentuated by the portable technique. Poor inspiratory effort is noted with crowding of vascular markings although mild vascular congestion is suggested. No focal infiltrate is seen. No bony abnormality is noted. IMPRESSION: Poor inspiratory effort with crowding of the vascular markings although a mild degree of vascular congestion is suspected. Electronically Signed   By: Alcide Clever M.D.   On: 06/01/2017 19:36   Ct Head Wo Contrast  Result Date: 06/01/2017 CLINICAL DATA:  Mental status changes.  Status post fall. EXAM: CT HEAD WITHOUT CONTRAST CT CERVICAL SPINE WITHOUT CONTRAST TECHNIQUE: Multidetector CT imaging of the head and cervical spine was performed following the standard protocol without intravenous contrast. Multiplanar CT image reconstructions of the cervical spine  were also generated. COMPARISON:  Head CT 08/29/2014.  Cervical spine CT 05/25/2012. FINDINGS: CT HEAD FINDINGS Brain: There is no evidence for acute hemorrhage, hydrocephalus, mass lesion, or abnormal extra-axial fluid collection. No definite CT evidence for acute infarction. Vascular: No hyperdense vessel or unexpected calcification. Skull: No evidence for fracture. No worrisome lytic or sclerotic lesion. Sinuses/Orbits: The visualized paranasal sinuses and mastoid air cells are clear. Visualized portions of the globes and intraorbital fat are unremarkable. Other: None. CT CERVICAL SPINE FINDINGS Study degraded by motion and large body habitus. Alignment: Straightening of normal cervical lordosis evident. Skull base and vertebrae: No acute fracture. No primary bone lesion or focal pathologic process. Soft tissues and spinal canal: No prevertebral fluid or swelling. No visible canal hematoma. Disc levels:  Preserved. Upper chest: Patchy ground-glass attenuation identified in both upper lobes with 10 mm nodular component anterior right upper lobe (image 93/series 8). Other: None. IMPRESSION: 1. No acute intracranial abnormality. 2. Motion degraded exam of the cervical spine shows no fracture or subluxation. 3. Patchy ground-glass attenuation identified in both lung apices including a 10 mm nodular component in the right lung apex. Imaging features could be infectious/inflammatory or related to pulmonary edema. CT chest without contrast could be used to further evaluate as clinically warranted. Electronically Signed   By: Kennith Center M.D.   On: 06/01/2017 18:24   Ct Cervical Spine Wo Contrast  Result Date:  06/01/2017 CLINICAL DATA:  Mental status changes.  Status post fall. EXAM: CT HEAD WITHOUT CONTRAST CT CERVICAL SPINE WITHOUT CONTRAST TECHNIQUE: Multidetector CT imaging of the head and cervical spine was performed following the standard protocol without intravenous contrast. Multiplanar CT image  reconstructions of the cervical spine were also generated. COMPARISON:  Head CT 08/29/2014.  Cervical spine CT 05/25/2012. FINDINGS: CT HEAD FINDINGS Brain: There is no evidence for acute hemorrhage, hydrocephalus, mass lesion, or abnormal extra-axial fluid collection. No definite CT evidence for acute infarction. Vascular: No hyperdense vessel or unexpected calcification. Skull: No evidence for fracture. No worrisome lytic or sclerotic lesion. Sinuses/Orbits: The visualized paranasal sinuses and mastoid air cells are clear. Visualized portions of the globes and intraorbital fat are unremarkable. Other: None. CT CERVICAL SPINE FINDINGS Study degraded by motion and large body habitus. Alignment: Straightening of normal cervical lordosis evident. Skull base and vertebrae: No acute fracture. No primary bone lesion or focal pathologic process. Soft tissues and spinal canal: No prevertebral fluid or swelling. No visible canal hematoma. Disc levels:  Preserved. Upper chest: Patchy ground-glass attenuation identified in both upper lobes with 10 mm nodular component anterior right upper lobe (image 93/series 8). Other: None. IMPRESSION: 1. No acute intracranial abnormality. 2. Motion degraded exam of the cervical spine shows no fracture or subluxation. 3. Patchy ground-glass attenuation identified in both lung apices including a 10 mm nodular component in the right lung apex. Imaging features could be infectious/inflammatory or related to pulmonary edema. CT chest without contrast could be used to further evaluate as clinically warranted. Electronically Signed   By: Kennith Center M.D.   On: 06/01/2017 18:24    Procedures Procedures (including critical care time)  Medications Ordered in ED Medications  potassium chloride SA (K-DUR,KLOR-CON) CR tablet 40 mEq (has no administration in time range)     Initial Impression / Assessment and Plan / ED Course  I have reviewed the triage vital signs and the nursing  notes.  Pertinent labs & imaging results that were available during my care of the patient were reviewed by me and considered in my medical decision making (see chart for details).  Iv ns. Continuous pulse ox and monitor. Labs.   Recheck, pt remains easily arousable.   On recheck, pt denies any new c/o or symptoms.   Pt acknowledges substance abuse, denies intentionally overdosing or trying to harm self. Denies feeling depressed or any thoughts of self harm.   Po fluids/food.  Ambulate in hall.  kcl po.   Pt alert, no new c/o, vitals normal. Hr 94, rr 16. Afebrile.   Pt appears stable for d/c.     Final Clinical Impressions(s) / ED Diagnoses   Final diagnoses:  None    ED Discharge Orders    None       Cathren Laine, MD 06/02/17 0002

## 2017-06-02 NOTE — ED Notes (Signed)
Patient is upset about medications that was in her bra. Patient wants them back. Security at bedside to escort patient out the building. Patient was explained the meds was disposed of because them being in her bra and they could not be verified.

## 2017-06-10 IMAGING — CR DG ANKLE COMPLETE 3+V*R*
3 series · 3 of 3 positions shown · non-contrast
Comparison: 02/06/2015

CLINICAL DATA: Pain at operative site, status post external
fixation one month ago

EXAM:
RIGHT ANKLE - COMPLETE 3+ VIEW

[ankle ap]
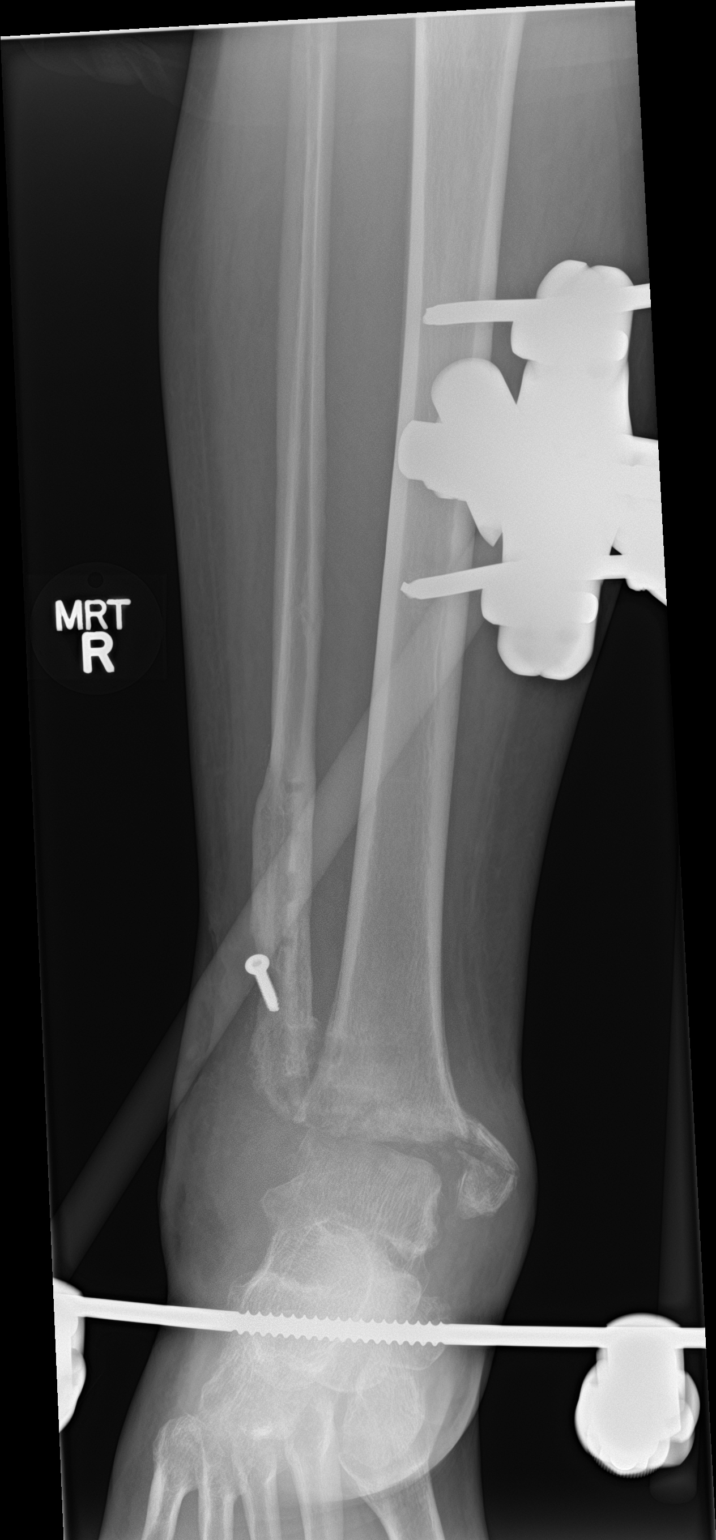

[ankle obl]
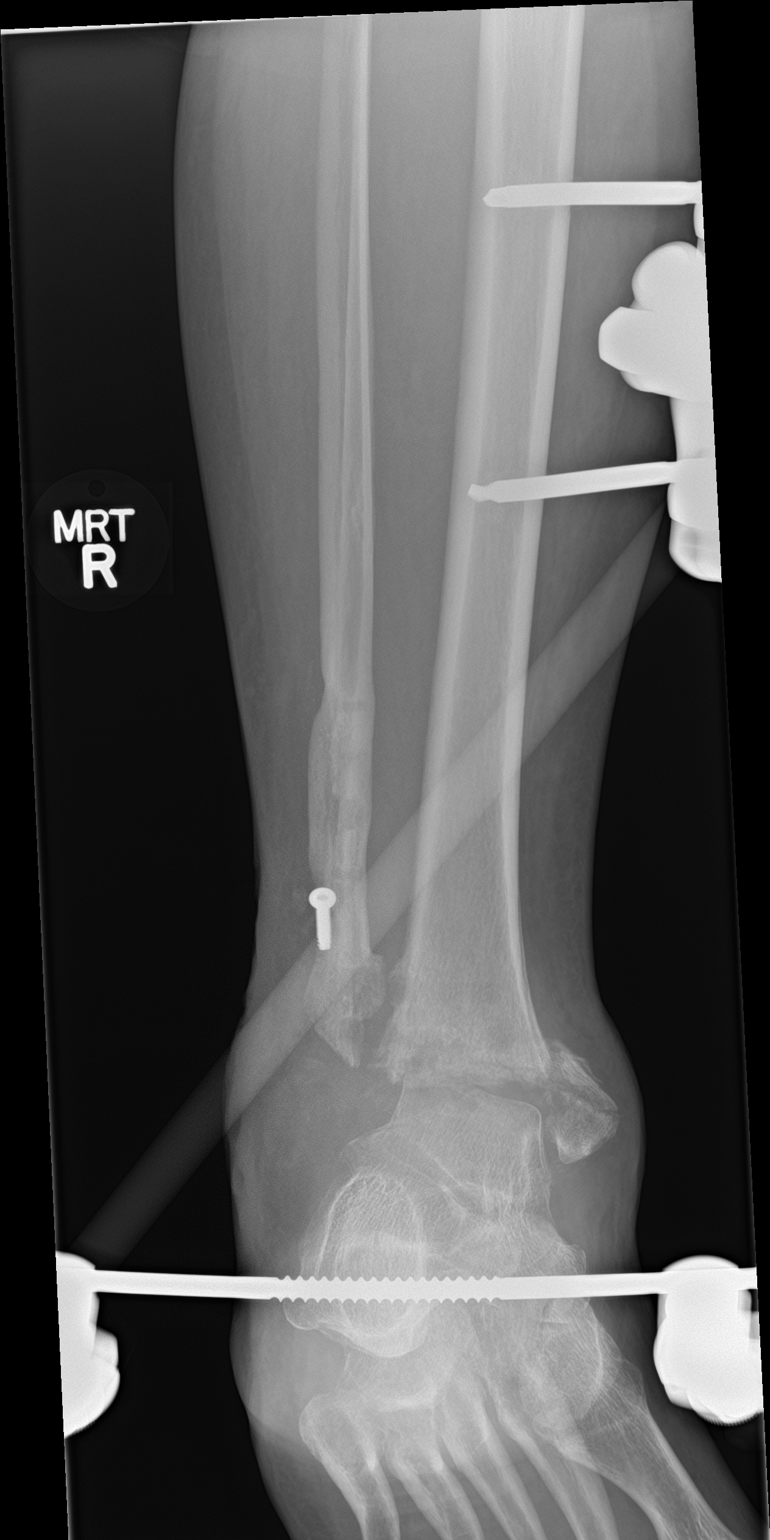

[ankle lat]
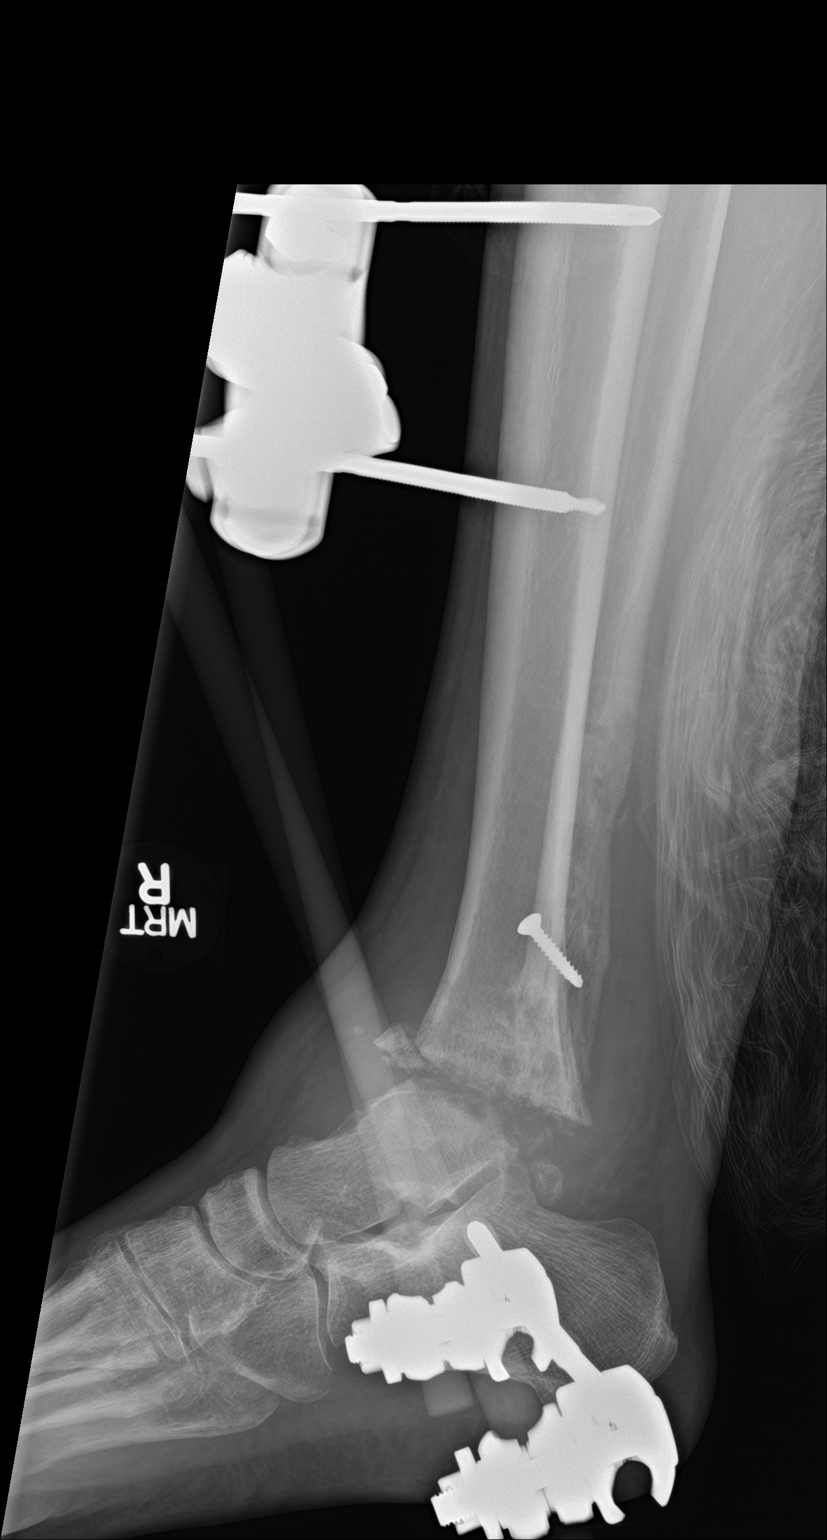

[3 of 3 positions shown; findings below may reference images not displayed]

FINDINGS: Three views of the right ankle submitted. Again noted postsurgical
changes in distal fibula with partial resection. Stable postsurgical
changes question resection distal tibia. Again noted external
fixation material with 2 proximal screws in tibial shaft. A
transverse screw is noted passing through calcaneus. There is no
significant change in alignment from prior exam. Diffuse soft tissue
swelling around the ankle without change from prior exam. Stable
bony fragments just medial to distal tibia and anterior to distal
tibia seen on lateral view. Lateral view shows irregularity and
erosion superior articular surface of the talus. Osteomyelitis
cannot be excluded. Clinical correlation is necessary.
IMPRESSION: Again noted postsurgical changes in distal fibula with partial
resection. Stable postsurgical changes question resection distal
tibia. Again noted external fixation material with 2 proximal screws
in tibial shaft. A transverse screw is noted passing through
calcaneus. There is no significant change in alignment from prior
exam. Diffuse soft tissue swelling around the ankle without change
from prior exam. Stable bony fragments just medial to distal tibia
and anterior to distal tibia seen on lateral view. Lateral view
shows irregularity and erosion superior articular surface of the
talus. Osteomyelitis cannot be excluded. Clinical correlation is
necessary.

## 2017-06-27 ENCOUNTER — Encounter (HOSPITAL_COMMUNITY): Payer: Self-pay | Admitting: Emergency Medicine

## 2017-06-27 ENCOUNTER — Emergency Department (HOSPITAL_COMMUNITY)
Admission: EM | Admit: 2017-06-27 | Discharge: 2017-06-27 | Disposition: A | Discharge status: Home / Self Care | Payer: Medicare Other | Attending: Emergency Medicine | Admitting: Emergency Medicine

## 2017-06-27 ENCOUNTER — Emergency Department (HOSPITAL_COMMUNITY): Payer: Medicare Other

## 2017-06-27 DIAGNOSIS — Z79899 Other long term (current) drug therapy: Secondary | ICD-10-CM | POA: Insufficient documentation

## 2017-06-27 DIAGNOSIS — F1910 Other psychoactive substance abuse, uncomplicated: Secondary | ICD-10-CM | POA: Insufficient documentation

## 2017-06-27 DIAGNOSIS — R0602 Shortness of breath: Secondary | ICD-10-CM | POA: Diagnosis not present

## 2017-06-27 DIAGNOSIS — F17210 Nicotine dependence, cigarettes, uncomplicated: Secondary | ICD-10-CM | POA: Diagnosis not present

## 2017-06-27 DIAGNOSIS — R05 Cough: Secondary | ICD-10-CM | POA: Diagnosis not present

## 2017-06-27 DIAGNOSIS — R0789 Other chest pain: Secondary | ICD-10-CM | POA: Diagnosis present

## 2017-06-27 DIAGNOSIS — J45909 Unspecified asthma, uncomplicated: Secondary | ICD-10-CM | POA: Insufficient documentation

## 2017-06-27 DIAGNOSIS — F419 Anxiety disorder, unspecified: Secondary | ICD-10-CM | POA: Insufficient documentation

## 2017-06-27 LAB — COMPREHENSIVE METABOLIC PANEL
ALK PHOS: 63 U/L (ref 38–126)
ALT: 24 U/L (ref 14–54)
ANION GAP: 15 (ref 5–15)
AST: 32 U/L (ref 15–41)
Albumin: 4 g/dL (ref 3.5–5.0)
BILIRUBIN TOTAL: 1 mg/dL (ref 0.3–1.2)
BUN: 11 mg/dL (ref 6–20)
CO2: 20 mmol/L — ABNORMAL LOW (ref 22–32)
CREATININE: 0.67 mg/dL (ref 0.44–1.00)
Calcium: 8.9 mg/dL (ref 8.9–10.3)
Chloride: 99 mmol/L — ABNORMAL LOW (ref 101–111)
GFR calc non Af Amer: >60 mL/min (ref >60)
GLUCOSE: 124 mg/dL — AB (ref 65–99)
Potassium: 4.1 mmol/L (ref 3.5–5.1)
Sodium: 134 mmol/L — ABNORMAL LOW (ref 135–145)
TOTAL PROTEIN: 8.5 g/dL — AB (ref 6.5–8.1)

## 2017-06-27 LAB — I-STAT BETA HCG BLOOD, ED (MC, WL, AP ONLY): I-stat hCG, quantitative: 7.8 m[IU]/mL — ABNORMAL HIGH (ref <5)

## 2017-06-27 LAB — I-STAT TROPONIN, ED: Troponin i, poc: 0 ng/mL (ref 0.00–0.08)

## 2017-06-27 LAB — CBC
HCT: 35.2 % — ABNORMAL LOW (ref 36.0–46.0)
Hemoglobin: 10.8 g/dL — ABNORMAL LOW (ref 12.0–15.0)
MCH: 23.3 pg — AB (ref 26.0–34.0)
MCHC: 30.7 g/dL (ref 30.0–36.0)
MCV: 75.9 fL — AB (ref 78.0–100.0)
PLATELETS: 477 10*3/uL — AB (ref 150–400)
RBC: 4.64 MIL/uL (ref 3.87–5.11)
RDW: 15.9 % — AB (ref 11.5–15.5)
WBC: 7.3 10*3/uL (ref 4.0–10.5)

## 2017-06-27 NOTE — ED Notes (Signed)
Asked for urine -- pt said not right now

## 2017-06-27 NOTE — Discharge Instructions (Addendum)
Contact [facility] to establish primary care. Follow up with the health department for repeat pregnancy test.  Stop drinking alcohol and using drugs.  There is information for outpatient and residential substance abuse services.  Return to the ER if you develop difficulty breathing, worsening tremors, seizures, or any new or concerning symptoms.

## 2017-06-27 NOTE — ED Notes (Signed)
Bed: ZO10 Expected date:  Expected time:  Means of arrival:  Comments: 41 yr SOB

## 2017-06-27 NOTE — ED Notes (Signed)
Pt refused to be put on pulse ox or monitor at this time.

## 2017-06-27 NOTE — ED Provider Notes (Signed)
[facility]-EMERGENCY DEPT Provider Note   CSN: [id number] Arrival date & time: 06/27/17  0605     History   Chief Complaint Chief Complaint  Patient presents with  . Anxiety    HPI Monique [name] is a 42 y.o. female presenting for evaluation of chest pain.  Patient states for the past hour, she has been having central chest pain.  Initially it was constant, but now it is intermittent.  Is described as a tightness or squeezing pain.  She denies associated shortness of breath, nausea, vomiting, or diaphoresis.  She states that she has been feeling more anxious than normal.  She reports a recent cough which is nonproductive.  She states she smokes a pack of cigarettes a day, drinks varying amount of alcohol a day, and recently used cocaine.  She denies other drug use.  She denies recent fevers, chills, abdominal pain, abnormal urination, abnormal bowel movements.  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  Per chart review, patient with history of anxiety, bipolar, polysubstance abuse, and BKA s/p osteo. She denies cardiac history. H/o blood clot listed in PMH, but no record of PE on imaging or prior notes.    HPI  Past Medical History:  Diagnosis Date  . Anemia   . Anxiety   . Arthritis   . Asthma    PHM  . Bipolar disorder (HCC)   . Chronic back pain    "middle and lower" (12/17/2014)  . Depression   . GERD (gastroesophageal reflux disease)   . H/O blood clots   . Infection right ankle  . Migraine    "weekly" (12/17/2014)  . MRSA infection   . Nerve damage of right foot   . 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 History of osteomyelitis   . Depression with anxiety   . Hypokalemia 07/08/2016  . Hyponatremia 07/08/2016  . 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 Polysubstance abuse (HCC) 03/15/2015  . Phantom pain (HCC) 03/15/2015  .  Elevated lactic acid level 03/15/2015  . BKA stump complication (HCC)   . Fever   . Osteomyelitis (HCC) 03/02/2015  . Cellulitis 03/02/2015  . Cellulitis and abscess 12/17/2014  . Nicotine abuse 12/17/2014  . Anemia, chronic disease 12/17/2014  . Conjunctivitis, left eye   . Wound infection after surgery 09/29/2014  . Irritation of left eye 09/29/2014  . Wound, surgical, infected 09/29/2014  . Acute encephalopathy 08/29/2014  . Drug abuse (HCC) 08/29/2014  . Cocaine abuse (HCC) 08/29/2014  . Acute respiratory failure with hypoxia (HCC) 08/29/2014    Past Surgical History:  Procedure Laterality Date  . AMPUTATION Right 03/04/2015   Procedure: Right Below Knee Amputation;  Surgeon: [name], MD;  Location: [facility] OR;  Service: Orthopedics;  Laterality: Right;  . ANKLE FRACTURE SURGERY Right 82016  . ANKLE FUSION Right 01/19/2015   Procedure: Right Tibiotalar Fusion, Place Antibiotic Beads;  Surgeon: [name], MD;  Location: [facility] OR;  Service: Orthopedics;  Laterality: Right;  . DILATION AND CURETTAGE OF UTERUS  X 2  . EXTERNAL FIXATION LEG Right 01/19/2015   Procedure: EXTERNAL FIXATION Right Ankle;  Surgeon: [name], MD;  Location: [facility] OR;  Service: Orthopedics;  Laterality: Right;  . EXTERNAL FIXATION REMOVAL Right 03/04/2015   Procedure: REMOVAL EXTERNAL FIXATION Right Lower Leg;  Surgeon: [name], MD;  Location: [facility] OR;  Service: Orthopedics;  Laterality: Right;  . FRACTURE SURGERY    . HARDWARE REMOVAL Right 09/02/2014   Procedure: Removal Right Lateral Ankle Hardware, Place Antibiotic Bead ;  Surgeon: [name], MD;  Location: [facility] ORS;  Service: Orthopedics;  Laterality: Right;  . INCISION AND DRAINAGE ABSCESS Right 12/19/2014   Procedure: INCISION AND DRAINAGE OF ABSCESS ON RIGHT SIDE OF CHIN;  Surgeon: [name], MD;  Location: [facility] OR;  Service: ENT;  Laterality: Right;  . STUMP REVISION Right 04/14/2015   Procedure: RIGHT BELOW KNEE AMPUTATION REVISION;   Surgeon: [name], MD;  Location: [facility] OR;  Service: Orthopedics;  Laterality: Right;  . STUMP REVISION Right 07/11/2016   Procedure: Revision Right Below Knee Amputation, Fibula;  Surgeon: [name], MD;  Location: [facility] OR;  Service: Orthopedics;  Laterality: Right;  . TUBAL LIGATION       OB History   None      Home Medications    Prior to Admission medications   Medication Sig Start Date End Date Taking? Authorizing Provider  ALPRAZolam Prudy Feeler) 1 MG tablet Take 1 mg by mouth 3 (three) times daily.   Yes [provider]  gabapentin (NEURONTIN) 300 MG capsule Take 1 capsule (300 mg total) by mouth 3 (three) times daily. 03/24/15  Yes Henrietta Hoover, NP  ALPRAZolam Prudy Feeler) 0.5 MG tablet Take 1 tablet (0.5 mg total) by mouth 2 (two) times daily as needed for anxiety. Patient not taking: Reported on 06/27/2017 01/18/16   [name], MD  amoxicillin-clavulanate (AUGMENTIN) 875-125 MG tablet Take 1 tablet by mouth 2 (two) times daily. Patient not taking: Reported on 06/27/2017 11/08/16   Doristine Bosworth, MD  buPROPion [facility] SR) 100 MG 12 hr tablet Take 1 tablet (100 mg total) by mouth 2 (two) times daily. Patient not taking: Reported on 06/27/2017 03/24/15   Henrietta Hoover, NP  cyclobenzaprine (FLEXERIL) 10 MG tablet TAKE 1 TABLET BY MOUTH THREE TIMES DAILY AS NEEDED FOR MUSCLE SPASMS Patient not taking: Reported on 06/27/2017 12/24/16   [name], MD  docusate sodium (COLACE) 100 MG capsule Take 1 capsule (100 mg total) by mouth 2 (two) times daily. Patient not taking: Reported on 06/27/2017 07/13/16   Rodolph Bong, MD  ferrous sulfate 325 (65 FE) MG tablet Take 1 tablet (325 mg total) by mouth daily with breakfast. Patient not taking: Reported on 06/27/2017 03/28/15   Henrietta Hoover, NP  ketorolac (TORADOL) 10 MG tablet Take 1 tablet (10 mg total) by mouth every 6 (six) hours as needed. Patient not taking: Reported on 06/27/2017 07/30/16   [name], MD    meloxicam (MOBIC) 7.5 MG tablet Take 1 tablet (7.5 mg total) by mouth daily. Patient not taking: Reported on 06/27/2017 08/03/16 08/03/17  Adonis Huguenin, NP  methocarbamol (ROBAXIN) 500 MG tablet Take 1 tablet (500 mg total) by mouth every 6 (six) hours as needed for muscle spasms. Patient not taking: Reported on 06/27/2017 03/24/15   Henrietta Hoover, NP  ondansetron (ZOFRAN) 4 MG tablet Take 1 tablet (4 mg total) by mouth every 8 (eight) hours as needed for nausea or vomiting. Patient not taking: Reported on 06/27/2017 07/21/16   Devoria Albe, MD  oxyCODONE-acetaminophen (PERCOCET) 10-325 MG tablet Take 1 tablet by mouth every 8 (eight) hours as needed for pain. Patient not taking: Reported on 06/27/2017 07/20/16   Adonis Huguenin, NP  pantoprazole (PROTONIX) 40 MG tablet Take  1 tablet (40 mg total) by mouth daily at 6 (six) AM. Patient not taking: Reported on 06/27/2017 07/14/16   Rodolph Bong, MD  polyethylene glycol [facility] Ethelene Hal) packet Take 17 g by mouth daily as needed for mild constipation. Patient not taking: Reported on 06/27/2017 07/13/16   Rodolph Bong, MD  senna-docusate (SENOKOT-S) 8.6-50 MG tablet Take 1 tablet by mouth at bedtime as needed for mild constipation. Patient not taking: Reported on 06/27/2017 03/06/15   Richarda Overlie, MD  silver sulfADIAZINE (SILVADENE) 1 % cream Apply 1 application topically daily. Patient not taking: Reported on 06/27/2017 08/02/16   [name], MD    Family History Family History  Problem Relation Age of Onset  . Lung cancer Mother   . Cancer Maternal Uncle   . Cancer Maternal Grandfather   . Cancer Paternal Grandfather     Social History Social History   Tobacco Use  . Smoking status: Current Every Day Smoker    Packs/day: 1.00    Years: 25.00    Pack years: 25.00    Types: Cigarettes  . Smokeless tobacco: Never Used  Substance Use Topics  . Alcohol use: Yes    Comment:  " 7 months ago " 01/18/15  . Drug use: Yes    Types: Cocaine,  Marijuana, Benzodiazepines    Comment: drug screen +  benzos,cocaine, marijuana pt denies using ( denies curent use 12/17/14     Allergies   No known allergies   Review of Systems Review of Systems  Cardiovascular: Positive for chest pain.  Psychiatric/Behavioral: The patient is nervous/anxious.   All other systems reviewed and are negative.    Physical Exam Updated Vital Signs BP 128/68   Pulse 95   Temp 98 F (36.7 C) (Oral)   Resp 17   LMP  (LMP Unknown)   SpO2 100%   Physical Exam  Constitutional: She is oriented to person, place, and time. She appears well-developed and well-nourished. No distress.  Pt appears in NAD  HENT:  Head: Normocephalic and atraumatic.  Eyes: Pupils are equal, round, and reactive to light. Conjunctivae and EOM are normal.  Neck: Normal range of motion. Neck supple.  Cardiovascular: Normal rate, regular rhythm and intact distal pulses.  Pulmonary/Chest: Effort normal and breath sounds normal. No respiratory distress. She has no wheezes.  Speaking in full sentences.  Clear lung sounds in all fields.  Tenderness to palpation of anterior chest wall.   Abdominal: Soft. She exhibits no distension. There is no tenderness.  Musculoskeletal: Normal range of motion. She exhibits no edema or tenderness.  R BKA. L leg without pain or swelling. Good pedal pulses.   Neurological: She is alert and oriented to person, place, and time.  Skin: Skin is warm and dry.  Psychiatric: She has a normal mood and affect.  Nursing note and vitals reviewed.    ED Treatments / Results  Labs (all labs ordered are listed, but only abnormal results are displayed) Labs Reviewed  CBC - Abnormal; Notable for the following components:      Result Value   Hemoglobin 10.8 (*)    HCT 35.2 (*)    MCV 75.9 (*)    MCH 23.3 (*)    RDW 15.9 (*)    Platelets 477 (*)    All other components within normal limits  COMPREHENSIVE METABOLIC PANEL - Abnormal; Notable for the  following components:   Sodium 134 (*)    Chloride 99 (*)  CO2 20 (*)    Glucose, Bld 124 (*)    Total Protein 8.5 (*)    All other components within normal limits  I-STAT BETA HCG BLOOD, ED ([facility], [facility], [facility] ONLY) - Abnormal; Notable for the following components:   I-stat hCG, quantitative 7.8 (*)    All other components within normal limits  URINALYSIS, ROUTINE W REFLEX MICROSCOPIC  RAPID URINE DRUG SCREEN, HOSP PERFORMED  I-STAT TROPONIN, ED    EKG EKG Interpretation  Date/Time:  Thursday Jun 27 2017 07:40:50 EDT Ventricular Rate:  97 PR Interval:    QRS Duration: 81 QT Interval:  378 QTC Calculation: 481 R Axis:   5 Text Interpretation:  Sinus rhythm Probable anteroseptal infarct, old since last tracing no significant change Confirmed by Mancel Bale [phone]) on 06/27/2017 7:45:34 AM   Radiology Dg Chest 2 View  Result Date: 06/27/2017 CLINICAL DATA:  Cough, chest congestion, and shortness of breath. Current smoker. History of anxiety, asthma. EXAM: CHEST - 2 VIEW COMPARISON:  Chest x-ray of June 01, 2017 FINDINGS: The lungs are adequately inflated and clear. The heart is top-normal in size. The pulmonary vascularity is normal. The mediastinum is normal in width. There is no pleural effusion. IMPRESSION: There is no active cardiopulmonary disease. Electronically Signed   By: David  [place] M.D.   On: 06/27/2017 07:51    Procedures Procedures (including critical care time)  Medications Ordered in ED Medications - No data to display   Initial Impression / Assessment and Plan / ED Course  I have reviewed the triage vital signs and the nursing notes.  Pertinent labs & imaging results that were available during my care of the patient were reviewed by me and considered in my medical decision making (see chart for details).     Patient presenting for evaluation of chest pain and anxiety.  Physical exam shows patient was initially tachycardic, which improved without intervention.   Appears anxious on exam.  Chest pain is reproducible.  She appears nontoxic.  As she had recent cocaine use, will obtain troponin, labs, EKG, chest x-ray.  Labs reassuring, troponin negative.  Hemoglobin stable.  Shows mild dehydration.  hCG mildly elevated, likely false positive.  Discussed finding with patient, and need for follow-up.  Patient is sexually active with one female partner and they do not use protection.  Chest x-ray viewed interpreted by me, no sign of pneumonia, pneumothorax, or effusion.  EKG without signs of STEMI.  Chest pain likely combination of drug use, anxiety, and/or other.  Doubt ACS, PE, infection, or other life-threatening emergent process.  Patient given resources for [facility] health and wellness to establish primary care and given resources for substance abuse counseling.  At this time, patient appears a for discharge.  Return percussions given.  Patient states he understands and agrees to plan.  Final Clinical Impressions(s) / ED Diagnoses   Final diagnoses:  Atypical chest pain  Polysubstance abuse [facility])    ED Discharge Orders    None       Alveria Apley, PA-C 06/27/17 0902    Mancel Bale, MD 06/27/17 2325

## 2017-06-27 NOTE — ED Triage Notes (Signed)
Pt comes to ed via ems, c/o anxiety and panic attack. Pt has been drinking. [place] found on [employer] [address]  V/s on arrival 140/90, hr 98, sp02 99, rr20. Alert x4.  Denies any other drug use.

## 2017-08-25 ENCOUNTER — Emergency Department (HOSPITAL_COMMUNITY)
Admission: EM | Admit: 2017-08-25 | Discharge: 2017-08-26 | Disposition: A | Discharge status: Home / Self Care | Payer: Medicare Other | Attending: Emergency Medicine | Admitting: Emergency Medicine

## 2017-08-25 ENCOUNTER — Encounter (HOSPITAL_COMMUNITY): Payer: Self-pay | Admitting: *Deleted

## 2017-08-25 DIAGNOSIS — F17210 Nicotine dependence, cigarettes, uncomplicated: Secondary | ICD-10-CM | POA: Diagnosis not present

## 2017-08-25 DIAGNOSIS — F1910 Other psychoactive substance abuse, uncomplicated: Secondary | ICD-10-CM | POA: Diagnosis not present

## 2017-08-25 DIAGNOSIS — R401 Stupor: Secondary | ICD-10-CM

## 2017-08-25 DIAGNOSIS — R403 Persistent vegetative state: Secondary | ICD-10-CM | POA: Insufficient documentation

## 2017-08-25 DIAGNOSIS — Z590 Homelessness: Secondary | ICD-10-CM | POA: Insufficient documentation

## 2017-08-25 DIAGNOSIS — Z79899 Other long term (current) drug therapy: Secondary | ICD-10-CM | POA: Diagnosis not present

## 2017-08-25 DIAGNOSIS — F419 Anxiety disorder, unspecified: Secondary | ICD-10-CM | POA: Insufficient documentation

## 2017-08-25 DIAGNOSIS — R4182 Altered mental status, unspecified: Secondary | ICD-10-CM | POA: Diagnosis present

## 2017-08-25 LAB — I-STAT CHEM 8, ED
BUN: 11 mg/dL (ref 6–20)
CALCIUM ION: 1.15 mmol/L (ref 1.15–1.40)
CHLORIDE: 109 mmol/L (ref 98–111)
Creatinine, Ser: 0.8 mg/dL (ref 0.44–1.00)
Glucose, Bld: 121 mg/dL — ABNORMAL HIGH (ref 70–99)
HCT: 36 % (ref 36.0–46.0)
HEMOGLOBIN: 12.2 g/dL (ref 12.0–15.0)
POTASSIUM: 3.2 mmol/L — AB (ref 3.5–5.1)
SODIUM: 145 mmol/L (ref 135–145)
TCO2: 23 mmol/L (ref 22–32)

## 2017-08-25 MED ORDER — SODIUM CHLORIDE 0.9 % IV SOLN
1000.0000 mL | INTRAVENOUS | Status: DC
Start: 2017-08-25 — End: 2017-08-26
  Administered 2017-08-26: 1000 mL via INTRAVENOUS

## 2017-08-25 MED ORDER — NALOXONE HCL 2 MG/2ML IJ SOSY
2.0000 mg | PREFILLED_SYRINGE | Freq: Once | INTRAMUSCULAR | Status: AC
  Administered 2017-08-25: 2 mg via INTRAVENOUS

## 2017-08-25 MED ORDER — NALOXONE HCL 2 MG/2ML IJ SOSY
PREFILLED_SYRINGE | INTRAMUSCULAR | Status: AC
  Filled 2017-08-25: qty 2

## 2017-08-25 MED ORDER — NALOXONE HCL 2 MG/2ML IJ SOSY
1.0000 mg | PREFILLED_SYRINGE | Freq: Once | INTRAMUSCULAR | Status: AC
  Administered 2017-08-25: 1 mg via INTRAVENOUS

## 2017-08-25 MED ORDER — SODIUM CHLORIDE 0.9 % IV BOLUS (SEPSIS)
1000.0000 mL | Freq: Once | INTRAVENOUS | Status: AC
  Administered 2017-08-25: 1000 mL via INTRAVENOUS

## 2017-08-25 NOTE — ED Triage Notes (Signed)
Pt was found behind a [employer] [place], unresponsive. EMS reports a bystander walked up and said the pt was wasted, then the patient walked away. 3mg  Narcan given for pinpoint pupils and unresponsiveness with little improvement. Pt is moaning when staff are getting pt undressed. Nasal trumpet noted to R nostril. BKA noted R leg

## 2017-08-25 NOTE — ED Notes (Addendum)
Pt did not respond to in and out cath.  2 tampons were removed that appeared to have been in place for a while.  Dr. Donnald GarrePfeiffer notified of same.  Nasal cannula in place.

## 2017-08-25 NOTE — ED Notes (Signed)
EDP at bedside  

## 2017-08-26 ENCOUNTER — Emergency Department (HOSPITAL_COMMUNITY): Payer: Medicare Other

## 2017-08-26 DIAGNOSIS — R403 Persistent vegetative state: Secondary | ICD-10-CM | POA: Diagnosis not present

## 2017-08-26 LAB — I-STAT VENOUS BLOOD GAS, ED
ACID-BASE DEFICIT: 4 mmol/L — AB (ref 0.0–2.0)
Bicarbonate: 23.9 mmol/L (ref 20.0–28.0)
O2 SAT: 65 %
TCO2: 25 mmol/L (ref 22–32)
pCO2, Ven: 53.7 mmHg (ref 44.0–60.0)
pH, Ven: 7.255 (ref 7.250–7.430)
pO2, Ven: 39 mmHg (ref 32.0–45.0)

## 2017-08-26 LAB — URINALYSIS, ROUTINE W REFLEX MICROSCOPIC
GLUCOSE, UA: NEGATIVE mg/dL
HGB URINE DIPSTICK: NEGATIVE
KETONES UR: 5 mg/dL — AB
LEUKOCYTES UA: NEGATIVE
Nitrite: NEGATIVE
PROTEIN: 100 mg/dL — AB
Specific Gravity, Urine: 1.026 (ref 1.005–1.030)
pH: 5 (ref 5.0–8.0)

## 2017-08-26 LAB — RAPID URINE DRUG SCREEN, HOSP PERFORMED
Amphetamines: NOT DETECTED
Benzodiazepines: POSITIVE — AB
Cocaine: POSITIVE — AB
Opiates: NOT DETECTED
Tetrahydrocannabinol: NOT DETECTED

## 2017-08-26 LAB — COMPREHENSIVE METABOLIC PANEL
ALBUMIN: 3.8 g/dL (ref 3.5–5.0)
ALT: 53 U/L — ABNORMAL HIGH (ref 0–44)
ANION GAP: 9 (ref 5–15)
AST: 46 U/L — ABNORMAL HIGH (ref 15–41)
Alkaline Phosphatase: 58 U/L (ref 38–126)
BUN: 11 mg/dL (ref 6–20)
CO2: 24 mmol/L (ref 22–32)
Calcium: 8.9 mg/dL (ref 8.9–10.3)
Chloride: 109 mmol/L (ref 98–111)
Creatinine, Ser: 0.89 mg/dL (ref 0.44–1.00)
GFR calc Af Amer: >60 mL/min (ref >60)
GFR calc non Af Amer: >60 mL/min (ref >60)
GLUCOSE: 119 mg/dL — AB (ref 70–99)
POTASSIUM: 3.1 mmol/L — AB (ref 3.5–5.1)
SODIUM: 142 mmol/L (ref 135–145)
Total Bilirubin: 0.8 mg/dL (ref 0.3–1.2)
Total Protein: 7.3 g/dL (ref 6.5–8.1)

## 2017-08-26 LAB — I-STAT BETA HCG BLOOD, ED (MC, WL, AP ONLY): I-stat hCG, quantitative: <5 m[IU]/mL (ref <5)

## 2017-08-26 LAB — CBC
HEMATOCRIT: 37.8 % (ref 36.0–46.0)
Hemoglobin: 10.9 g/dL — ABNORMAL LOW (ref 12.0–15.0)
MCH: 23.3 pg — ABNORMAL LOW (ref 26.0–34.0)
MCHC: 28.8 g/dL — ABNORMAL LOW (ref 30.0–36.0)
MCV: 80.8 fL (ref 78.0–100.0)
PLATELETS: 410 10*3/uL — AB (ref 150–400)
RBC: 4.68 MIL/uL (ref 3.87–5.11)
RDW: 17.6 % — ABNORMAL HIGH (ref 11.5–15.5)
WBC: 7.1 10*3/uL (ref 4.0–10.5)

## 2017-08-26 LAB — ETHANOL: Alcohol, Ethyl (B): <10 mg/dL (ref <10)

## 2017-08-26 LAB — ACETAMINOPHEN LEVEL: Acetaminophen (Tylenol), Serum: <10 ug/mL — ABNORMAL LOW (ref 10–30)

## 2017-08-26 LAB — CBG MONITORING, ED: GLUCOSE-CAPILLARY: 99 mg/dL (ref 70–99)

## 2017-08-26 NOTE — ED Notes (Signed)
Pt moving in stretcher, however is not verbal

## 2017-08-26 NOTE — ED Notes (Signed)
Patient given blue scrub pants, socks, [place] sandwich meal, sprite, and 2 bus passes. Verbalized understanding discharge instructions.

## 2017-08-26 NOTE — ED Notes (Signed)
Patient is starting to wake up  States she doesn't remember what happened, states she takes xanax daily for panic attacks. States the last thing she remembers is going to court. States she is currently on her menstrual period, patient was assisted with cleaning up and pad given. States she is homeless.

## 2017-08-26 NOTE — ED Notes (Signed)
Dr Blinda LeatherwoodPollina informed of venous blood gas results

## 2017-08-26 NOTE — ED Notes (Signed)
Multiple attempts to get pt to leave nasal cannula on and she continues to pull it off.

## 2017-08-26 NOTE — Discharge Instructions (Addendum)
Follow-up with your primary care provider.  Stop using cocaine.  Return if your symptoms worsen.

## 2017-08-26 NOTE — ED Notes (Signed)
Pt moving and groaning. Not speaking coherently. RN notified.

## 2017-08-26 NOTE — ED Notes (Signed)
Breakfast tray given patient awakened.

## 2017-08-26 NOTE — ED Notes (Signed)
Breakfast tray ordered 

## 2017-08-26 NOTE — ED Notes (Signed)
Starting to move around more on stretcher however she isn't verbal at this time.

## 2017-08-26 NOTE — ED Provider Notes (Signed)
[facility] EMERGENCY DEPARTMENT Provider Note   CSN: [id number] Arrival date & time: 08/25/17  2208     History   Chief Complaint Chief Complaint  Patient presents with  . Altered Mental Status    HPI Monique [name] is a 42 y.o. female.  HPI Patient was passed out at a gas station lying in the parking lot.  It was a friend\bystander who reported that the patient was "wasted".  No one clarified what type of substance the patient used.  Patient has history of polysubstance abuse and alcohol abuse.  First responders gave 2 mg intranasal Narcan.  Patient temporarily responded but then became somnolent again.  Upon EMS arrival, patient was somnolent with shallow respirations.  They administered one IV Narcan.  Patient did again temporarily respond but became somnolent again.  vital signs of been stable during transport. Past Medical History:  Diagnosis Date  . Anemia   . Anxiety   . Arthritis   . Asthma    PHM  . Bipolar disorder (HCC)   . Chronic back pain    "middle and lower" (12/17/2014)  . Depression   . GERD (gastroesophageal reflux disease)   . H/O blood clots   . Infection right ankle  . Migraine    "weekly" (12/17/2014)  . MRSA infection   . Nerve damage of right foot   . Osteomyelitis of ankle [facility])    right    Patient Active Problem List   Diagnosis Date Noted  . History of osteomyelitis   . Depression with anxiety   . Hypokalemia 07/08/2016  . Hyponatremia 07/08/2016  . 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 Polysubstance abuse (HCC) 03/15/2015  . Phantom pain (HCC) 03/15/2015  . Elevated lactic acid level 03/15/2015  . BKA stump complication (HCC)   . Fever   . Osteomyelitis (HCC) 03/02/2015  . Cellulitis 03/02/2015  . Cellulitis and abscess 12/17/2014  . Nicotine abuse 12/17/2014  . Anemia, chronic disease 12/17/2014  . Conjunctivitis, left eye   . Wound infection after surgery 09/29/2014  .  Irritation of left eye 09/29/2014  . Wound, surgical, infected 09/29/2014  . Acute encephalopathy 08/29/2014  . Drug abuse (HCC) 08/29/2014  . Cocaine abuse (HCC) 08/29/2014  . Acute respiratory failure with hypoxia (HCC) 08/29/2014    Past Surgical History:  Procedure Laterality Date  . AMPUTATION Right 03/04/2015   Procedure: Right Below Knee Amputation;  Surgeon: [name], MD;  Location: [facility] OR;  Service: Orthopedics;  Laterality: Right;  . ANKLE FRACTURE SURGERY Right 82016  . ANKLE FUSION Right 01/19/2015   Procedure: Right Tibiotalar Fusion, Place Antibiotic Beads;  Surgeon: [name], MD;  [facility] OR;  Service: Orthopedics;  Laterality: Right;  . DILATION AND CURETTAGE OF UTERUS  X 2  . EXTERNAL FIXATION LEG Right 01/19/2015   Procedure: EXTERNAL FIXATION Right Ankle;  Surgeon: [name], MD;  Location: [facility] OR;  Service: Orthopedics;  Laterality: Right;  . EXTERNAL FIXATION REMOVAL Right 03/04/2015   Procedure: REMOVAL EXTERNAL FIXATION Right Lower Leg;  Surgeon: [name], MD;  Location: [facility] OR;  Service: Orthopedics;  Laterality: Right;  . FRACTURE SURGERY    . HARDWARE REMOVAL Right 09/02/2014   Procedure: Removal Right Lateral Ankle Hardware, Place Antibiotic Bead ;  Surgeon: [name], MD;  Location: [facility] ORS;  Service: Orthopedics;  Laterality: Right;  . INCISION AND DRAINAGE ABSCESS Right 12/19/2014   Procedure: INCISION AND DRAINAGE OF  ABSCESS ON RIGHT SIDE OF CHIN;  Surgeon: Christia Readingwight Bates, MD;  [facility];  Service: ENT;  Laterality: Right;  . STUMP REVISION Right 04/14/2015   Procedure: RIGHT BELOW KNEE AMPUTATION REVISION;  Surgeon: Nadara MustardMarcus Duda V, MD;  Location: [facility] OR;  Service: Orthopedics;  Laterality: Right;  . STUMP REVISION Right 07/11/2016   Procedure: Revision Right Below Knee Amputation, Fibula;  Surgeon: Nadara Mustarduda, Marcus V, MD;  [facility];  Service: Orthopedics;  Laterality: Right;  . TUBAL LIGATION       OB History   None      Home  Medications    Prior to Admission medications   Medication Sig Start Date End Date Taking? Authorizing Provider  ALPRAZolam Prudy Feeler(XANAX) 0.5 MG tablet Take 1 tablet (0.5 mg total) by mouth 2 (two) times daily as needed for anxiety. Patient not taking: Reported on 06/27/2017 01/18/16   Nadara Mustarduda, Marcus V, MD  ALPRAZolam Prudy Feeler(XANAX) 1 MG tablet Take 1 mg by mouth 3 (three) times daily.    [provider]  amoxicillin-clavulanate (AUGMENTIN) 875-125 MG tablet Take 1 tablet by mouth 2 (two) times daily. Patient not taking: Reported on 06/27/2017 11/08/16   Doristine BosworthStallings, Zoe A, MD  buPROPion [facility](WELLBUTRIN SR) 100 MG 12 hr tablet Take 1 tablet (100 mg total) by mouth 2 (two) times daily. Patient not taking: Reported on 06/27/2017 03/24/15   Henrietta HooverBernhardt, Linda C, NP  cyclobenzaprine (FLEXERIL) 10 MG tablet TAKE 1 TABLET BY MOUTH THREE TIMES DAILY AS NEEDED FOR MUSCLE SPASMS Patient not taking: Reported on 06/27/2017 12/24/16   Nadara Mustarduda, Marcus V, MD  docusate sodium (COLACE) 100 MG capsule Take 1 capsule (100 mg total) by mouth 2 (two) times daily. Patient not taking: Reported on 06/27/2017 07/13/16   Rodolph Bonghompson, Daniel V, MD  ferrous sulfate 325 (65 FE) MG tablet Take 1 tablet (325 mg total) by mouth daily with breakfast. Patient not taking: Reported on 06/27/2017 03/28/15   Henrietta HooverBernhardt, Linda C, NP  gabapentin (NEURONTIN) 300 MG capsule Take 1 capsule (300 mg total) by mouth 3 (three) times daily. 03/24/15   Henrietta HooverBernhardt, Linda C, NP  ketorolac (TORADOL) 10 MG tablet Take 1 tablet (10 mg total) by mouth every 6 (six) hours as needed. Patient not taking: Reported on 06/27/2017 07/30/16   Nadara Mustarduda, Marcus V, MD  methocarbamol (ROBAXIN) 500 MG tablet Take 1 tablet (500 mg total) by mouth every 6 (six) hours as needed for muscle spasms. Patient not taking: Reported on 06/27/2017 03/24/15   Henrietta HooverBernhardt, Linda C, NP  ondansetron (ZOFRAN) 4 MG tablet Take 1 tablet (4 mg total) by mouth every 8 (eight) hours as needed for nausea or vomiting. Patient not  taking: Reported on 06/27/2017 07/21/16   Devoria AlbeKnapp, Iva, MD  oxyCODONE-acetaminophen (PERCOCET) 10-325 MG tablet Take 1 tablet by mouth every 8 (eight) hours as needed for pain. Patient not taking: Reported on 06/27/2017 07/20/16   Adonis HugueninZamora, Erin R, NP  pantoprazole (PROTONIX) 40 MG tablet Take 1 tablet (40 mg total) by mouth daily at 6 (six) AM. Patient not taking: Reported on 06/27/2017 07/14/16   Rodolph Bonghompson, Daniel V, MD  polyethylene glycol [facility](MIRALAX / Ethelene HalGLYCOLAX) packet Take 17 g by mouth daily as needed for mild constipation. Patient not taking: Reported on 06/27/2017 07/13/16   Rodolph Bonghompson, Daniel V, MD  senna-docusate (SENOKOT-S) 8.6-50 MG tablet Take 1 tablet by mouth at bedtime as needed for mild constipation. Patient not taking: Reported on 06/27/2017 03/06/15   Richarda OverlieAbrol, Nayana, MD  silver sulfADIAZINE (SILVADENE) 1 % cream  Apply 1 application topically daily. Patient not taking: Reported on 06/27/2017 08/02/16   [name], MD    Family History Family History  Problem Relation Age of Onset  . Lung cancer Mother   . Cancer Maternal Uncle   . Cancer Maternal Grandfather   . Cancer Paternal Grandfather     Social History Social History   Tobacco Use  . Smoking status: Current Every Day Smoker    Packs/day: 1.00    Years: 25.00    Pack years: 25.00    Types: Cigarettes  . Smokeless tobacco: Never Used  Substance Use Topics  . Alcohol use: Yes    Comment:  " 7 months ago " 01/18/15  . Drug use: Yes    Types: Cocaine, Marijuana, Benzodiazepines    Comment: drug screen +  benzos,cocaine, marijuana pt denies using ( denies curent use 12/17/14     Allergies   No known allergies   Review of Systems Review of Systems  Cannot obtain review of systems level 5 caveat mental status change Physical Exam Updated Vital Signs BP 116/67 (BP Location: Right Arm)   Pulse 93   Temp 97.6 F (36.4 C)   Resp (!) 22   SpO2 97%   Physical Exam  Constitutional:  Patient is very somnolent with regular but  sonorous respirations.  She only responds to sternal rub with moaning and raising her arms.  Skin is warm color is good  HENT:  No Apparent head injury.  No palpable hematomas.  Airways patent without pooling secretions.  Patient has a nasal trumpet in place.  Eyes:  Pupils are 2 mm bilaterally.  She does have corneal pain response with squinting of the eyelids and turning her head away.  Neck: Neck supple.  Cardiovascular: Normal rate, regular rhythm, normal heart sounds and intact distal pulses.  Pulmonary/Chest: Effort normal and breath sounds normal.  Abdominal: Soft. She exhibits no distension. There is no tenderness. There is no guarding.  Musculoskeletal:  She has right AKA.  This is well-healed.  Left lower extremity no peripheral edema erythema or wounds.  No track marks on upper extremities.  Neurological:  She is very somnolent and obtunded.  She does have response to pain with sternal rub or corneal brush.  She will raise both arms up towards her face to push me away and groan.  She is not awakened to produce meaningful speech.  She also will roll to her side and spontaneously moves both lower extremities.  Skin: Skin is warm and dry.     ED Treatments / Results  Labs (all labs ordered are listed, but only abnormal results are displayed) Labs Reviewed  I-STAT CHEM 8, ED - Abnormal; Notable for the following components:      Result Value   Potassium 3.2 (*)    Glucose, Bld 121 (*)    All other components within normal limits  COMPREHENSIVE METABOLIC PANEL  ETHANOL  ACETAMINOPHEN LEVEL  CBC  URINALYSIS, ROUTINE W REFLEX MICROSCOPIC  RAPID URINE DRUG SCREEN, HOSP PERFORMED  BLOOD GAS, VENOUS  I-STAT BETA HCG BLOOD, ED ([facility], [facility], [facility] ONLY)  CBG MONITORING, ED    EKG None  Radiology No results found.  Procedures Procedures (including critical care time) CRITICAL CARE Performed by: Arby Barrette   Total critical care time: 45 minutes  Critical care time was  exclusive of separately billable procedures and treating other patients.  Critical care was necessary to treat or prevent imminent or life-threatening deterioration.  Critical care was time spent personally by me on the following activities: development of treatment plan with patient and/or surrogate as well as nursing, discussions with consultants, evaluation of patient's response to treatment, examination of patient, obtaining history from patient or surrogate, ordering and performing treatments and interventions, ordering and review of laboratory studies, ordering and review of radiographic studies, pulse oximetry and re-evaluation of patient's condition. Medications Ordered in ED Medications  sodium chloride 0.9 % bolus 1,000 mL (1,000 mLs Intravenous New Bag/Given 08/25/17 2345)    Followed by  0.9 %  sodium chloride infusion (has no administration in time range)  naloxone [facility]) injection 1 mg (1 mg Intravenous Given 08/25/17 2224)  naloxone [facility]) injection 2 mg (2 mg Intravenous Given 08/25/17 2318)     Initial Impression / Assessment and Plan / ED Course  I have reviewed the triage vital signs and the nursing notes.  Pertinent labs & imaging results that were available during my care of the patient were reviewed by me and considered in my medical decision making (see chart for details).     To this point, patient has remained very somnolent.  Without stimulus her oxygen saturation is about 88% when sleeping.  Narcan has had a little effect.  She moans and rolls over slightly but does not awaken to any meaningful speech.  She is not following any commands.  Blood pressure heart rate are stable.  Plan will be to proceed with diagnostic evaluation as the patient remains somnolent and not awakening adequately with administration of Narcan.  Suspected polysubstance abuse. Dr. Blinda Leatherwood to follow up diagnostic evaluation and final disposition.  Final Clinical Impressions(s) / ED Diagnoses     Final diagnoses:  Obtundation    ED Discharge Orders    None       Arby Barrette, MD 08/26/17 [phone]

## 2017-08-26 NOTE — ED Provider Notes (Signed)
Patient presented with unknown overdose.  She was signed out to me to monitor.  Recheck at 6 AM reveals that she stirs to voice and tries to answer questions but is still not alert enough for discharge.   Gilda CreasePollina, Christopher J, MD 08/26/17 [phone]

## 2017-10-07 ENCOUNTER — Ambulatory Visit (INDEPENDENT_AMBULATORY_CARE_PROVIDER_SITE_OTHER): Payer: Medicare Other | Admitting: Orthopedic Surgery

## 2018-01-20 ENCOUNTER — Ambulatory Visit (INDEPENDENT_AMBULATORY_CARE_PROVIDER_SITE_OTHER): Payer: Medicare Other | Admitting: Orthopedic Surgery

## 2018-07-28 ENCOUNTER — Ambulatory Visit: Payer: Medicare Other | Admitting: Orthopedic Surgery

## 2018-08-05 ENCOUNTER — Other Ambulatory Visit: Payer: Self-pay

## 2018-08-05 ENCOUNTER — Ambulatory Visit (INDEPENDENT_AMBULATORY_CARE_PROVIDER_SITE_OTHER): Payer: Medicare HMO | Admitting: Physician Assistant

## 2018-08-05 ENCOUNTER — Encounter: Payer: Self-pay | Admitting: Orthopedic Surgery

## 2018-08-05 VITALS — Ht 64.0 in | Wt 192.8 lb

## 2018-08-05 DIAGNOSIS — Z89511 Acquired absence of right leg below knee: Secondary | ICD-10-CM

## 2018-08-05 NOTE — Progress Notes (Signed)
Office Visit Note   Patient: Monique [name]           Date of Birth: 10/11/1975           MRN: [id number] Visit Date: 08/05/2018              Requested by: No referring provider defined for this encounter. PCP: Patient, [name]  Chief Complaint  Patient presents with  . Right Leg - Follow-up    07/11/16 revision right bKA      HPI: The patient is a 43 yo woman who is seen for follow up of her right transtibial amputation. She reports her prosthetic is starting to rotate on her and she is loosing suction. Her liner also has a large hole and the foot component of a prosthesis has broken. She reports the prosthetic is rotating and sliding off and it is causing skin irritation, but no ulcers.    Assessment & Plan: Visit Diagnoses:  1. S/P unilateral BKA (below knee amputation), right [facility](HCC)     Plan:Prescription given for [employer] to evaluate her prosthetics for possible new sockets, liners, and repair or replacement of a broken foot component with necessary materials and supplies.   Follow-Up Instructions: Return in about 1 year (around 08/05/2019).   Ortho Exam  Patient is alert, oriented, no adenopathy, well-dressed, normal affect, normal respiratory effort. The patient ambulates with an antalgic gait and obvious loss of suction. The right transtibial amputation residual limb is well consolidated and without ulceration. She has several areas of pink discoloration/pressure areas, but no breakdown. No signs of infection or cellulitis.   Imaging: No results found. No images are attached to the encounter.  Labs: Lab Results  Component Value Date   HGBA1C 5.5 07/09/2016   HGBA1C 5.6 03/02/2015   ESRSEDRATE 6 07/08/2016   ESRSEDRATE 80 (H) 03/02/2015   ESRSEDRATE 88 (H) 02/16/2015   CRP <0.8 07/08/2016   CRP 1.7 (H) 03/02/2015   CRP 2.0 (H) 02/16/2015   REPTSTATUS 08/20/2016 FINAL 08/15/2016   GRAMSTAIN  12/19/2014    NO WBC SEEN NO SQUAMOUS EPITHELIAL CELLS SEEN  NO ORGANISMS SEEN [facility]    GRAMSTAIN  12/19/2014    NO WBC SEEN NO SQUAMOUS EPITHELIAL CELLS SEEN NO ORGANISMS SEEN [facility]formed at [facility]    CULT NO GROWTH 5 DAYS 08/15/2016   LABORGA STAPHYLOCOCCUS AUREUS 12/19/2014     Lab Results  Component Value Date   ALBUMIN 3.8 08/25/2017   ALBUMIN 4.0 06/27/2017   ALBUMIN 3.5 06/01/2017   PREALBUMIN 27.0 03/02/2015    Body mass index is 33.09 kg/m.  Orders:  No orders of the defined types were placed in this encounter.  No orders of the defined types were placed in this encounter.    Procedures: No procedures performed  Clinical Data: No additional findings.  ROS:  All other systems negative, except as noted in the HPI. Review of Systems  Objective: Vital Signs: Ht 5\' 4"  (1.626 m)   Wt 192 lb 12.8 oz (87.5 kg)   BMI 33.09 kg/m   Specialty Comments:  No specialty comments available.  PMFS History: Patient Active Problem List   Diagnosis Date Noted  . History of osteomyelitis   . Depression with anxiety   . Hypokalemia 07/08/2016  . Hyponatremia 07/08/2016  . 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 Polysubstance abuse (HCC) 03/15/2015  . Phantom pain (HCC) 03/15/2015  . Elevated lactic acid level 03/15/2015  .  BKA stump complication ([place])   . Fever   . Osteomyelitis ([place]) 03/02/2015  . Cellulitis 03/02/2015  . Cellulitis and abscess 12/17/2014  . Nicotine abuse 12/17/2014  . Anemia, chronic disease 12/17/2014  . Conjunctivitis, left eye   . Wound infection after surgery 09/29/2014  . Irritation of left eye 09/29/2014  . Wound, surgical, infected 09/29/2014  . Acute encephalopathy 08/29/2014  . Drug abuse ([place]) 08/29/2014  . Cocaine abuse ([place]) 08/29/2014  . Acute respiratory failure with hypoxia ([place]) 08/29/2014   Past Medical History:  Diagnosis Date  . Anemia   . Anxiety   . Arthritis   . Asthma    PHM  . Bipolar disorder ([place])    . Chronic back pain    "middle and lower" (12/17/2014)  . Depression   . GERD (gastroesophageal reflux disease)   . H/O blood clots   . Infection right ankle  . Migraine    "weekly" (12/17/2014)  . MRSA infection   . Nerve damage of right foot   . Osteomyelitis of ankle ([place])    right    Family History  Problem Relation Age of Onset  . Lung cancer Mother   . Cancer Maternal Uncle   . Cancer Maternal Grandfather   . Cancer Paternal Grandfather     Past Surgical History:  Procedure Laterality Date  . AMPUTATION Right 03/04/2015   Procedure: Right Below Knee Amputation;  Surgeon: Newt Minion, MD;  Location: [place];  Service: Orthopedics;  Laterality: Right;  . ANKLE FRACTURE SURGERY Right 82016  . ANKLE FUSION Right 01/19/2015   Procedure: Right Tibiotalar Fusion, Place Antibiotic Beads;  Surgeon: Newt Minion, MD;  Location: [place];  Service: Orthopedics;  Laterality: Right;  . DILATION AND CURETTAGE OF UTERUS  X 2  . EXTERNAL FIXATION LEG Right 01/19/2015   Procedure: EXTERNAL FIXATION Right Ankle;  Surgeon: Newt Minion, MD;  Location: [place];  Service: Orthopedics;  Laterality: Right;  . EXTERNAL FIXATION REMOVAL Right 03/04/2015   Procedure: REMOVAL EXTERNAL FIXATION Right Lower Leg;  Surgeon: Newt Minion, MD;  Location: [place];  Service: Orthopedics;  Laterality: Right;  . FRACTURE SURGERY    . HARDWARE REMOVAL Right 09/02/2014   Procedure: Removal Right Lateral Ankle Hardware, Place Antibiotic Bead ;  Surgeon: Newt Minion, MD;  [facility];  Service: Orthopedics;  Laterality: Right;  . INCISION AND DRAINAGE ABSCESS Right 12/19/2014   Procedure: INCISION AND DRAINAGE OF ABSCESS ON RIGHT SIDE OF CHIN;  Surgeon: Melida Quitter, MD;  Location: [place];  Service: ENT;  Laterality: Right;  . STUMP REVISION Right 04/14/2015   Procedure: RIGHT BELOW KNEE AMPUTATION REVISION;  Surgeon: Newt Minion, MD;  Location: [place];  Service: Orthopedics;  Laterality: Right;  . STUMP REVISION  Right 07/11/2016   Procedure: Revision Right Below Knee Amputation, Fibula;  Surgeon: Newt Minion, MD;  Location: [place];  Service: Orthopedics;  Laterality: Right;  . TUBAL LIGATION     Social History   Occupational History  . Not on file  Tobacco Use  . Smoking status: Current Every Day Smoker    Packs/day: 1.00    Years: 25.00    Pack years: 25.00    Types: Cigarettes  . Smokeless tobacco: Never Used  Substance and Sexual Activity  . Alcohol use: Yes    Comment:  " 7 months ago " 01/18/15  . Drug use: Yes    Types: Cocaine, Marijuana, Benzodiazepines  Comment: drug screen +  benzos,cocaine, marijuana pt denies using ( denies curent use 12/17/14  . Sexual activity: Yes    Birth control/protection: None, Surgical

## 2018-09-29 ENCOUNTER — Encounter: Payer: Self-pay | Admitting: Orthopedic Surgery

## 2018-09-29 ENCOUNTER — Ambulatory Visit (INDEPENDENT_AMBULATORY_CARE_PROVIDER_SITE_OTHER): Payer: Medicare HMO

## 2018-09-29 ENCOUNTER — Ambulatory Visit (INDEPENDENT_AMBULATORY_CARE_PROVIDER_SITE_OTHER): Payer: Medicare HMO | Admitting: Orthopedic Surgery

## 2018-09-29 VITALS — Ht 64.0 in | Wt 192.0 lb

## 2018-09-29 DIAGNOSIS — Z89511 Acquired absence of right leg below knee: Secondary | ICD-10-CM

## 2018-09-29 DIAGNOSIS — L97911 Non-pressure chronic ulcer of unspecified part of right lower leg limited to breakdown of skin: Secondary | ICD-10-CM

## 2018-09-29 MED ORDER — TRAMADOL HCL 50 MG PO TABS: 50.0000 mg | ORAL_TABLET | Freq: Four times a day (QID) | ORAL | 0 refills | Status: DC | PRN

## 2018-09-29 MED ORDER — MUPIROCIN 2 % EX OINT: 1.0000 "application " | TOPICAL_OINTMENT | Freq: Two times a day (BID) | CUTANEOUS | 3 refills | Status: AC

## 2018-09-29 MED ORDER — DOXYCYCLINE HYCLATE 100 MG PO TABS: 100.0000 mg | ORAL_TABLET | Freq: Two times a day (BID) | ORAL | 0 refills | Status: DC
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
## 2018-10-03 LAB — WOUND CULTURE
MICRO NUMBER:: 730150
SPECIMEN QUALITY:: ADEQUATE

## 2018-10-15 ENCOUNTER — Other Ambulatory Visit: Payer: Self-pay | Admitting: Orthopedic Surgery

## 2018-10-16 ENCOUNTER — Other Ambulatory Visit: Payer: Self-pay | Admitting: Orthopedic Surgery

## 2018-10-16 MED ORDER — TRAMADOL HCL 50 MG PO TABS: 50.0000 mg | ORAL_TABLET | Freq: Four times a day (QID) | ORAL | 0 refills | Status: AC | PRN

## 2018-10-16 NOTE — Telephone Encounter (Signed)
Patient was called, lvm that Rx was filled.

## 2018-10-16 NOTE — Telephone Encounter (Signed)
She had a rx called in yesterday

## 2018-10-16 NOTE — Telephone Encounter (Signed)
Patient was called and notified that Rx was refilled.

## 2018-10-16 NOTE — Telephone Encounter (Signed)
Please see below and advise.

## 2018-10-16 NOTE — Telephone Encounter (Signed)
rx sent

## 2018-10-20 ENCOUNTER — Ambulatory Visit: Payer: Medicare HMO | Admitting: Orthopedic Surgery

## 2018-11-01 ENCOUNTER — Other Ambulatory Visit: Payer: Self-pay | Admitting: Orthopedic Surgery

## 2018-11-04 NOTE — Telephone Encounter (Signed)
Pt had residual limb cultures from wound and had been started on Doxy bid. Do you want to refill this for the pt?

## 2018-12-02 ENCOUNTER — Ambulatory Visit: Payer: Self-pay | Admitting: Internal Medicine

## 2018-12-15 ENCOUNTER — Ambulatory Visit: Payer: Medicare HMO | Admitting: Internal Medicine

## 2018-12-15 ENCOUNTER — Other Ambulatory Visit: Payer: Self-pay

## 2018-12-15 ENCOUNTER — Encounter: Payer: Self-pay | Admitting: Internal Medicine

## 2018-12-15 VITALS — BP 130/72 | HR 74 | Resp 12 | Ht 63.0 in | Wt 204.0 lb

## 2018-12-15 DIAGNOSIS — F319 Bipolar disorder, unspecified: Secondary | ICD-10-CM

## 2018-12-15 DIAGNOSIS — F4310 Post-traumatic stress disorder, unspecified: Secondary | ICD-10-CM | POA: Diagnosis not present

## 2018-12-15 DIAGNOSIS — F1910 Other psychoactive substance abuse, uncomplicated: Secondary | ICD-10-CM

## 2018-12-15 DIAGNOSIS — F410 Panic disorder [episodic paroxysmal anxiety] without agoraphobia: Secondary | ICD-10-CM | POA: Diagnosis not present

## 2018-12-15 DIAGNOSIS — E669 Obesity, unspecified: Secondary | ICD-10-CM

## 2018-12-15 DIAGNOSIS — Z6836 Body mass index (BMI) 36.0-36.9, adult: Secondary | ICD-10-CM
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
## 2019-01-20 ENCOUNTER — Ambulatory Visit: Payer: Medicare HMO | Admitting: Physician Assistant

## 2019-04-13 ENCOUNTER — Encounter: Payer: Self-pay | Admitting: Physician Assistant

## 2019-04-13 ENCOUNTER — Other Ambulatory Visit: Payer: Self-pay

## 2019-04-13 ENCOUNTER — Ambulatory Visit (INDEPENDENT_AMBULATORY_CARE_PROVIDER_SITE_OTHER): Payer: Medicare HMO | Admitting: Physician Assistant

## 2019-04-13 DIAGNOSIS — Z89511 Acquired absence of right leg below knee: Secondary | ICD-10-CM | POA: Diagnosis not present

## 2019-04-13 NOTE — Progress Notes (Signed)
Office Visit Note   Patient: Monique [name]           Date of Birth: 01/04/1976           MRN: [id number] Visit Date: 04/13/2019              Requested by: No referring provider defined for this encounter. PCP: Patient, [name]  Chief Complaint  Patient presents with  . Right Knee - Pain      HPI: This is a pleasant 44 year old woman who is status post right knee below-knee amputation.  She is in need of new liners and socks.  She states her socket is fine.  She is also requesting tramadol as she has gone back to work and over-the-counter medication is not as effective  Assessment & Plan: Visit Diagnoses:  1. S/P unilateral BKA (below knee amputation), right (HCC)     Plan: I did give her a prescription for new supplies for her prosthetic.  These are medically necessary to prevent skin breakdown and proper fitting of the socket.  With regards to tramadol I explained to her that this could be done through pain management since she had not had any recent surgery with [place].  She was agreeable to this plan and we have placed a referral she may follow-up as needed Follow-Up Instructions: No follow-ups on file.   Ortho Exam  Patient is alert, oriented, no adenopathy, well-dressed, normal affect, normal respiratory effort. Focused examination demonstrates amputation stump to be in good condition no areas of breakdown or ulceration.  She does have ill fitting liner and has had to place wrap beneath it in order for it properly no surrounding cellulitis  Imaging: No results found. No images are attached to the encounter.  Labs: Lab Results  Component Value Date   HGBA1C 5.5 07/09/2016   HGBA1C 5.6 03/02/2015   ESRSEDRATE 6 07/08/2016   ESRSEDRATE 80 (H) 03/02/2015   ESRSEDRATE 88 (H) 02/16/2015   CRP <0.8 07/08/2016   CRP 1.7 (H) 03/02/2015   CRP 2.0 (H) 02/16/2015   REPTSTATUS 08/20/2016 FINAL 08/15/2016   GRAMSTAIN  12/19/2014    NO WBC SEEN NO SQUAMOUS EPITHELIAL  CELLS SEEN NO ORGANISMS SEEN Performed at [facility]    GRAMSTAIN  12/19/2014    NO WBC SEEN NO SQUAMOUS EPITHELIAL CELLS SEEN NO ORGANISMS SEEN Performed at [facility]    CULT NO GROWTH 5 DAYS 08/15/2016   LABORGA STAPHYLOCOCCUS AUREUS 12/19/2014     Lab Results  Component Value Date   ALBUMIN 3.8 08/25/2017   ALBUMIN 4.0 06/27/2017   ALBUMIN 3.5 06/01/2017   PREALBUMIN 27.0 03/02/2015    Lab Results  Component Value Date   MG 2.1 07/09/2016   MG 1.8 03/02/2015   No results found for: VD25OH  Lab Results  Component Value Date   PREALBUMIN 27.0 03/02/2015   CBC EXTENDED Latest Ref Rng & Units 08/25/2017 08/25/2017 06/27/2017  WBC 4.0 - 10.5 K/uL - 7.1 7.3  RBC 3.87 - 5.11 MIL/uL - 4.68 4.64  HGB 12.0 - 15.0 g/dL 79.8 10.9(L) 10.8(L)  HCT 36.0 - 46.0 % 36.0 37.8 35.2(L)  PLT 150 - 400 K/uL - 410(H) 477(H)  NEUTROABS 1.7 - 7.7 K/uL - - -  LYMPHSABS 0.7 - 4.0 K/uL - - -     There is no height or weight on file to calculate BMI.  Orders:  Orders Placed This Encounter  Procedures  . Ambulatory referral to Pain Clinic  No orders of the defined types were placed in this encounter.    Procedures: No procedures performed  Clinical Data: No additional findings.  ROS:  All other systems negative, except as noted in the HPI. Review of Systems  Objective: Vital Signs: There were no vitals taken for this visit.  Specialty Comments:  No specialty comments available.  PMFS History: Patient Active Problem List   Diagnosis Date Noted  . History of osteomyelitis   . Depression with anxiety   . Hypokalemia 07/08/2016  . Hyponatremia 07/08/2016  . 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 Polysubstance abuse (HCC) 03/15/2015  . Phantom pain (HCC) 03/15/2015  . Elevated lactic acid level 03/15/2015  . BKA stump complication (HCC)   . Fever   . Osteomyelitis (HCC) 03/02/2015  . Cellulitis 03/02/2015  . Cellulitis and  abscess 12/17/2014  . Nicotine abuse 12/17/2014  . Anemia, chronic disease 12/17/2014  . Conjunctivitis, left eye   . Wound infection after surgery 09/29/2014  . Irritation of left eye 09/29/2014  . Wound, surgical, infected 09/29/2014  . Acute encephalopathy 08/29/2014  . Drug abuse (HCC) 08/29/2014  . Cocaine abuse (HCC) 08/29/2014  . Acute respiratory failure with hypoxia (HCC) 08/29/2014   Past Medical History:  Diagnosis Date  . Anemia   . Anxiety   . Arthritis   . Asthma    PHM  . Bipolar disorder (HCC)   . Chronic back pain    "middle and lower" (12/17/2014)  . Depression   . GERD (gastroesophageal reflux disease)   . H/O blood clots   . Infection right ankle  . Migraine    "weekly" (12/17/2014)  . MRSA infection   . Nerve damage of right foot   . Osteomyelitis of ankle (HCC)    right    Family History  Problem Relation Age of Onset  . Lung cancer Mother   . Heart disease Mother        MI and lung cancer were cause of death  . Cancer Maternal Uncle   . Cancer Maternal Grandfather   . Cancer Paternal Grandfather     Past Surgical History:  Procedure Laterality Date  . AMPUTATION Right 03/04/2015   Procedure: Right Below Knee Amputation;  Surgeon: [name], MD;  Location: [facility];  Service: Orthopedics;  Laterality: Right;  . ANKLE FRACTURE SURGERY Right 82016  . ANKLE FUSION Right 01/19/2015   Procedure: Right Tibiotalar Fusion, Place Antibiotic Beads;  Surgeon: [name], MD;  [facility];  Service: Orthopedics;  Laterality: Right;  . DILATION AND CURETTAGE OF UTERUS  X 2  . EXTERNAL FIXATION LEG Right 01/19/2015   Procedure: EXTERNAL FIXATION Right Ankle;  Surgeon: [name], MD;  Location: [facility];  Service: Orthopedics;  Laterality: Right;  . EXTERNAL FIXATION REMOVAL Right 03/04/2015   Procedure: REMOVAL EXTERNAL FIXATION Right Lower Leg;  Surgeon: [name], MD;  [facility];  Service: Orthopedics;  Laterality: Right;  . FRACTURE  SURGERY    . HARDWARE REMOVAL Right 09/02/2014   Procedure: Removal Right Lateral Ankle Hardware, Place Antibiotic Bead ;  Surgeon: [name], MD;  [facility];  Service: Orthopedics;  Laterality: Right;  . INCISION AND DRAINAGE ABSCESS Right 12/19/2014   Procedure: INCISION AND DRAINAGE OF ABSCESS ON RIGHT SIDE OF CHIN;  Surgeon: [name], MD;  Location: [facility] OR;  Service: ENT;  Laterality: Right;  . STUMP REVISION Right 04/14/2015   Procedure: RIGHT BELOW  KNEE AMPUTATION REVISION;  Surgeon: Newt Minion, MD;  Location: [facility];  Service: Orthopedics;  Laterality: Right;  . STUMP REVISION Right 07/11/2016   Procedure: Revision Right Below Knee Amputation, Fibula;  Surgeon: Newt Minion, MD;  Location: [facility];  Service: Orthopedics;  Laterality: Right;  . TUBAL LIGATION     Social History   Occupational History  . Occupation: Disabled  Tobacco Use  . Smoking status: Current Every Day Smoker    Packs/day: 1.00    Years: 23.00    Pack years: 23.00    Types: Cigarettes  . Smokeless tobacco: Never Used  Substance and Sexual Activity  . Alcohol use: Yes    Comment: Occasional use--celebrations  . Drug use: Yes    Types: Cocaine, Marijuana, Benzodiazepines    Comment: drug screen +  benzos,cocaine, marijuana pt denies using ( denies curent use 12/17/14) Denies 2020  . Sexual activity: Not Currently    Birth control/protection: Surgical

## 2019-09-07 ENCOUNTER — Ambulatory Visit (INDEPENDENT_AMBULATORY_CARE_PROVIDER_SITE_OTHER): Payer: Medicare HMO

## 2019-09-07 ENCOUNTER — Telehealth: Payer: Self-pay | Admitting: Orthopedic Surgery

## 2019-09-07 ENCOUNTER — Ambulatory Visit (INDEPENDENT_AMBULATORY_CARE_PROVIDER_SITE_OTHER): Payer: Medicare HMO | Admitting: Orthopedic Surgery

## 2019-09-07 ENCOUNTER — Other Ambulatory Visit: Payer: Self-pay

## 2019-09-07 ENCOUNTER — Encounter: Payer: Self-pay | Admitting: Orthopedic Surgery

## 2019-09-07 VITALS — Ht 63.0 in | Wt 204.0 lb

## 2019-09-07 DIAGNOSIS — Z89511 Acquired absence of right leg below knee: Secondary | ICD-10-CM | POA: Diagnosis not present

## 2019-09-07 MED ORDER — ALPRAZOLAM 1 MG PO TABS: 1.0000 mg | ORAL_TABLET | Freq: Three times a day (TID) | ORAL | 0 refills | Status: DC | PRN

## 2019-09-07 MED ORDER — OXYCODONE-ACETAMINOPHEN 10-325 MG PO TABS: 1.0000 | ORAL_TABLET | Freq: Three times a day (TID) | ORAL | 0 refills | Status: AC | PRN

## 2019-09-07 NOTE — Telephone Encounter (Signed)
Pt would like to have a referral put in for pain management and her phoe is messed up right now so we can't call her but she states she'll call us on 09/08/19 to further discuss.

## 2019-09-07 NOTE — Progress Notes (Signed)
Office Visit Note   Patient: Monique [name]           Date of Birth: [birth year]           MRN: [id number] Visit Date: 09/07/2019              Requested by: No referring provider defined for this encounter. PCP: Patient, [name]  Chief Complaint  Patient presents with  . Right Leg - Follow-up      HPI: Patient is a 44 year old woman who states she was getting up from a seated position and her prosthesis fell off causing her to land directly on the residual limb she states the liner was sweaty patient states she still has not had a call from a pain management referral.  Assessment & Plan: Visit Diagnoses:  1. S/P unilateral BKA (below knee amputation), right (HCC)     Plan: We will follow up on her pain clinic referral she was given a prescription for biotech for a new silicone liner new socket materials supplies and a stump shrinker.  Prescriptions called in for Percocet and Xanax.  Follow-Up Instructions: No follow-ups on file.   Ortho Exam  Patient is alert, oriented, no adenopathy, well-dressed, normal affect, normal respiratory effort. Examination patient does have bruising over the end of the residual limb on the right, as well as bruising, extends up into the popliteal fossa there is no skin breakdown no cellulitis no drainage no open wound.  Imaging: No results found. No images are attached to the encounter.  Labs: Lab Results  Component Value Date   HGBA1C 5.5 07/09/2016   HGBA1C 5.6 03/02/2015   ESRSEDRATE 6 07/08/2016   ESRSEDRATE 80 (H) 03/02/2015   ESRSEDRATE 88 (H) 02/16/2015   CRP <0.8 07/08/2016   CRP 1.7 (H) 03/02/2015   CRP 2.0 (H) 02/16/2015   REPTSTATUS 08/20/2016 FINAL 08/15/2016   GRAMSTAIN  12/19/2014    NO WBC SEEN NO SQUAMOUS EPITHELIAL CELLS SEEN NO ORGANISMS SEEN Performed at [facility]    GRAMSTAIN  12/19/2014    NO WBC SEEN NO SQUAMOUS EPITHELIAL CELLS SEEN NO ORGANISMS SEEN Performed at [facility]      CULT NO GROWTH 5 DAYS 08/15/2016   LABORGA STAPHYLOCOCCUS AUREUS 12/19/2014     Lab Results  Component Value Date   ALBUMIN 3.8 08/25/2017   ALBUMIN 4.0 06/27/2017   ALBUMIN 3.5 06/01/2017   PREALBUMIN 27.0 03/02/2015    Lab Results  Component Value Date   MG 2.1 07/09/2016   MG 1.8 03/02/2015   No results found for: VD25OH  Lab Results  Component Value Date   PREALBUMIN 27.0 03/02/2015   CBC EXTENDED Latest Ref Rng & Units 08/25/2017 08/25/2017 06/27/2017  WBC 4.0 - 10.5 K/uL - 7.1 7.3  RBC 3.87 - 5.11 MIL/uL - 4.68 4.64  HGB 12.0 - 15.0 g/dL 79.3 10.9(L) 10.8(L)  HCT 36 - 46 % 36.0 37.8 35.2(L)  PLT 150 - 400 K/uL - 410(H) 477(H)  NEUTROABS 1.7 - 7.7 K/uL - - -  LYMPHSABS 0.7 - 4.0 K/uL - - -     Body mass index is 36.14 kg/m.  Orders:  Orders Placed This Encounter  Procedures  . XR Tibia/Fibula Right   Meds ordered this encounter  Medications  . ALPRAZolam (XANAX) 1 MG tablet    Sig: Take 1 tablet (1 mg total) by mouth 3 (three) times daily as needed for anxiety.    Dispense:  30 tablet  Refill:  0  . oxyCODONE-acetaminophen (PERCOCET) 10-325 MG tablet    Sig: Take 1 tablet by mouth every 8 (eight) hours as needed for pain.    Dispense:  21 tablet    Refill:  0     Procedures: No procedures performed  Clinical Data: No additional findings.  ROS:  All other systems negative, except as noted in the HPI. Review of Systems  Objective: Vital Signs: Ht 5\' 3"  (1.6 m)   Wt 204 lb (92.5 kg)   BMI 36.14 kg/m   Specialty Comments:  No specialty comments available.  PMFS History: Patient Active Problem List   Diagnosis Date Noted  . History of osteomyelitis   . Depression with anxiety   . Hypokalemia 07/08/2016  . Hyponatremia 07/08/2016  . 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 Polysubstance abuse (HCC) 03/15/2015  . Phantom pain (HCC) 03/15/2015  . Elevated lactic acid level 03/15/2015  . BKA stump complication  (HCC)   . Fever   . Osteomyelitis (HCC) 03/02/2015  . Cellulitis 03/02/2015  . Cellulitis and abscess 12/17/2014  . Nicotine abuse 12/17/2014  . Anemia, chronic disease 12/17/2014  . Conjunctivitis, left eye   . Wound infection after surgery 09/29/2014  . Irritation of left eye 09/29/2014  . Wound, surgical, infected 09/29/2014  . Acute encephalopathy 08/29/2014  . Drug abuse (HCC) 08/29/2014  . Cocaine abuse (HCC) 08/29/2014  . Acute respiratory failure with hypoxia (HCC) 08/29/2014   Past Medical History:  Diagnosis Date  . Anemia   . Anxiety   . Arthritis   . Asthma    PHM  . Bipolar disorder (HCC)   . Chronic back pain    "middle and lower" (12/17/2014)  . Depression   . GERD (gastroesophageal reflux disease)   . H/O blood clots   . Infection right ankle  . Migraine    "weekly" (12/17/2014)  . MRSA infection   . Nerve damage of right foot   . Osteomyelitis of ankle (HCC)    right    Family History  Problem Relation Age of Onset  . Lung cancer Mother   . Heart disease Mother        MI and lung cancer were cause of death  . Cancer Maternal Uncle   . Cancer Maternal Grandfather   . Cancer Paternal Grandfather     Past Surgical History:  Procedure Laterality Date  . AMPUTATION Right 03/04/2015   Procedure: Right Below Knee Amputation;  Surgeon: 05/02/2015, MD;  [facility];  Service: Orthopedics;  Laterality: Right;  . ANKLE FRACTURE SURGERY Right 82016  . ANKLE FUSION Right 01/19/2015   Procedure: Right Tibiotalar Fusion, Place Antibiotic Beads;  Surgeon: 01/21/2015, MD;  Location: [facility] OR;  Service: Orthopedics;  Laterality: Right;  . DILATION AND CURETTAGE OF UTERUS  X 2  . EXTERNAL FIXATION LEG Right 01/19/2015   Procedure: EXTERNAL FIXATION Right Ankle;  Surgeon: 01/21/2015, MD;  Location[facility] OR;  Service: Orthopedics;  Laterality: Right;  . EXTERNAL FIXATION REMOVAL Right 03/04/2015   Procedure: REMOVAL EXTERNAL FIXATION Right Lower Leg;  Surgeon:  05/02/2015, MD;  Location: [facility] OR;  Service: Orthopedics;  Laterality: Right;  . FRACTURE SURGERY    . HARDWARE REMOVAL Right 09/02/2014   Procedure: Removal Right Lateral Ankle Hardware, Place Antibiotic Bead ;  Surgeon: 11/03/2014, MD;  Location: [facility] ORS;  Service: Orthopedics;  Laterality: Right;  . INCISION AND DRAINAGE ABSCESS Right  12/19/2014   Procedure: INCISION AND DRAINAGE OF ABSCESS ON RIGHT SIDE OF CHIN;  Surgeon: [name], MD;  Location: [facility] OR;  Service: ENT;  Laterality: Right;  . STUMP REVISION Right 04/14/2015   Procedure: RIGHT BELOW KNEE AMPUTATION REVISION;  Surgeon: [name], MD;  Location: [facility] OR;  Service: Orthopedics;  Laterality: Right;  . STUMP REVISION Right 07/11/2016   Procedure: Revision Right Below Knee Amputation, Fibula;  Surgeon: [name], MD;  Location: [facility] OR;  Service: Orthopedics;  Laterality: Right;  . TUBAL LIGATION     Social History   Occupational History  . Occupation: Disabled  Tobacco Use  . Smoking status: Current Every Day Smoker    Packs/day: 1.00    Years: 23.00    Pack years: 23.00    Types: Cigarettes  . Smokeless tobacco: Never Used  Vaping Use  . Vaping Use: Never used  Substance and Sexual Activity  . Alcohol use: Yes    Comment: Occasional use--celebrations  . Drug use: Yes    Types: Cocaine, Marijuana, Benzodiazepines    Comment: drug screen +  benzos,cocaine, marijuana pt denies using ( denies curent use 12/17/14) Denies 2020  . Sexual activity: Not Currently    Birth control/protection: Surgical

## 2019-09-09 ENCOUNTER — Other Ambulatory Visit: Payer: Self-pay

## 2019-09-09 DIAGNOSIS — L97911 Non-pressure chronic ulcer of unspecified part of right lower leg limited to breakdown of skin: Secondary | ICD-10-CM

## 2019-09-09 DIAGNOSIS — G894 Chronic pain syndrome: Secondary | ICD-10-CM

## 2019-09-09 DIAGNOSIS — Z89511 Acquired absence of right leg below knee: Secondary | ICD-10-CM

## 2019-09-09 NOTE — Telephone Encounter (Signed)
Called patient no answer.

## 2019-09-09 NOTE — Telephone Encounter (Signed)
Number not in service.

## 2019-09-09 NOTE — Telephone Encounter (Signed)
General 04/15/2019  2:19 PM Elezovic, Philmore Pali B - -  Note   LVM for this patient to give [place] a call to schedule a new visit with Dr. Carlis Abbott .           This was since Feb 2021. I will resubmit new referral.

## 2019-09-14 ENCOUNTER — Encounter: Payer: Self-pay | Admitting: Physical Medicine and Rehabilitation

## 2019-09-14 ENCOUNTER — Telehealth: Payer: Self-pay | Admitting: Orthopedic Surgery

## 2019-09-14 NOTE — Telephone Encounter (Signed)
Too soon for refill.

## 2019-09-14 NOTE — Telephone Encounter (Signed)
Called pt and phone rang without answer or vm will hold and try again.

## 2019-09-14 NOTE — Telephone Encounter (Signed)
This pt was in the office 09/07/19 following a direct impact fall on her residual limb. Referral made to pain management and rx given for oxycodone 10/325 # 21 pt states that she has taken more than advised due to significant pain and is requesting a refill please advise.

## 2019-09-14 NOTE — Telephone Encounter (Signed)
Patient called.   She is requesting a refill on her oxycodone. Said she's been having to take more than instructed because of her pain levels so this request is early.   Call back: [phone]

## 2019-09-15 NOTE — Telephone Encounter (Signed)
I called pt again and there is no answer. I am signing off on this message and will await pt to return call. No voicemail available and allowed phone to ring multiple times.

## 2019-09-15 NOTE — Telephone Encounter (Signed)
I called pt no answer and no vm available. I will hold message and try pt again later.

## 2019-09-16 ENCOUNTER — Telehealth: Payer: Self-pay | Admitting: Orthopedic Surgery

## 2019-09-16 NOTE — Telephone Encounter (Signed)
Pt would like to know when she'll be allowed to get another refill. Pt doesn't have phone right now and is only able to call us.

## 2019-09-17 NOTE — Telephone Encounter (Signed)
Hx BKA in office last week s/p fall. Denied refill of pain medication on Monday and pt is wanting to know when she is allowed to have anohter refill.

## 2019-09-19 ENCOUNTER — Emergency Department (HOSPITAL_COMMUNITY)
Admission: EM | Admit: 2019-09-19 | Discharge: 2019-09-20 | Disposition: A | Discharge status: Other Acute Inpatient Hospital | Payer: Medicare HMO | Attending: Emergency Medicine | Admitting: Emergency Medicine

## 2019-09-19 ENCOUNTER — Other Ambulatory Visit: Payer: Self-pay

## 2019-09-19 ENCOUNTER — Emergency Department (HOSPITAL_COMMUNITY): Payer: Medicare HMO

## 2019-09-19 ENCOUNTER — Encounter (HOSPITAL_COMMUNITY): Payer: Self-pay | Admitting: Emergency Medicine

## 2019-09-19 DIAGNOSIS — F17210 Nicotine dependence, cigarettes, uncomplicated: Secondary | ICD-10-CM | POA: Diagnosis not present

## 2019-09-19 DIAGNOSIS — Y9389 Activity, other specified: Secondary | ICD-10-CM | POA: Diagnosis not present

## 2019-09-19 DIAGNOSIS — Z79899 Other long term (current) drug therapy: Secondary | ICD-10-CM | POA: Insufficient documentation

## 2019-09-19 DIAGNOSIS — Z20822 Contact with and (suspected) exposure to covid-19: Secondary | ICD-10-CM | POA: Diagnosis not present

## 2019-09-19 DIAGNOSIS — W1839XA Other fall on same level, initial encounter: Secondary | ICD-10-CM | POA: Insufficient documentation

## 2019-09-19 DIAGNOSIS — Z89511 Acquired absence of right leg below knee: Secondary | ICD-10-CM | POA: Insufficient documentation

## 2019-09-19 DIAGNOSIS — F19959 Other psychoactive substance use, unspecified with psychoactive substance-induced psychotic disorder, unspecified: Secondary | ICD-10-CM | POA: Insufficient documentation

## 2019-09-19 DIAGNOSIS — R45851 Suicidal ideations: Secondary | ICD-10-CM | POA: Diagnosis not present

## 2019-09-19 DIAGNOSIS — M25561 Pain in right knee: Secondary | ICD-10-CM

## 2019-09-19 DIAGNOSIS — Y929 Unspecified place or not applicable: Secondary | ICD-10-CM | POA: Insufficient documentation

## 2019-09-19 DIAGNOSIS — W19XXXA Unspecified fall, initial encounter: Secondary | ICD-10-CM

## 2019-09-19 DIAGNOSIS — Y999 Unspecified external cause status: Secondary | ICD-10-CM | POA: Diagnosis not present

## 2019-09-19 LAB — CBC WITH DIFFERENTIAL/PLATELET
Abs Immature Granulocytes: 0.01 10*3/uL (ref 0.00–0.07)
Basophils Absolute: 0 10*3/uL (ref 0.0–0.1)
Basophils Relative: 0 %
Eosinophils Absolute: 0.1 10*3/uL (ref 0.0–0.5)
Eosinophils Relative: 1 %
HCT: 40.4 % (ref 36.0–46.0)
Hemoglobin: 11.2 g/dL — ABNORMAL LOW (ref 12.0–15.0)
Immature Granulocytes: 0 %
Lymphocytes Relative: 32 %
Lymphs Abs: 1.6 10*3/uL (ref 0.7–4.0)
MCH: 23.2 pg — ABNORMAL LOW (ref 26.0–34.0)
MCHC: 27.7 g/dL — ABNORMAL LOW (ref 30.0–36.0)
MCV: 83.8 fL (ref 80.0–100.0)
Monocytes Absolute: 0.5 10*3/uL (ref 0.1–1.0)
Monocytes Relative: 10 %
Neutro Abs: 2.8 10*3/uL (ref 1.7–7.7)
Neutrophils Relative %: 57 %
Platelets: 386 10*3/uL (ref 150–400)
RBC: 4.82 MIL/uL (ref 3.87–5.11)
RDW: 16.2 % — ABNORMAL HIGH (ref 11.5–15.5)
WBC: 5 10*3/uL (ref 4.0–10.5)
nRBC: 0 % (ref 0.0–0.2)

## 2019-09-19 LAB — COMPREHENSIVE METABOLIC PANEL
ALT: 63 U/L — ABNORMAL HIGH (ref 0–44)
AST: 66 U/L — ABNORMAL HIGH (ref 15–41)
Albumin: 3.6 g/dL (ref 3.5–5.0)
Alkaline Phosphatase: 55 U/L (ref 38–126)
Anion gap: 11 (ref 5–15)
BUN: 10 mg/dL (ref 6–20)
CO2: 24 mmol/L (ref 22–32)
Calcium: 9.2 mg/dL (ref 8.9–10.3)
Chloride: 104 mmol/L (ref 98–111)
Creatinine, Ser: 0.7 mg/dL (ref 0.44–1.00)
GFR calc Af Amer: >60 mL/min (ref >60)
GFR calc non Af Amer: >60 mL/min (ref >60)
Glucose, Bld: 165 mg/dL — ABNORMAL HIGH (ref 70–99)
Potassium: 4.2 mmol/L (ref 3.5–5.1)
Sodium: 139 mmol/L (ref 135–145)
Total Bilirubin: 1.6 mg/dL — ABNORMAL HIGH (ref 0.3–1.2)
Total Protein: 6.9 g/dL (ref 6.5–8.1)

## 2019-09-19 LAB — RAPID URINE DRUG SCREEN, HOSP PERFORMED
Amphetamines: POSITIVE — AB
Barbiturates: NOT DETECTED
Benzodiazepines: NOT DETECTED
Cocaine: POSITIVE — AB
Opiates: NOT DETECTED
Tetrahydrocannabinol: POSITIVE — AB

## 2019-09-19 LAB — ETHANOL: Alcohol, Ethyl (B): <10 mg/dL (ref <10)

## 2019-09-19 LAB — ACETAMINOPHEN LEVEL: Acetaminophen (Tylenol), Serum: <10 ug/mL — ABNORMAL LOW (ref 10–30)

## 2019-09-19 LAB — PREGNANCY, URINE: Preg Test, Ur: NEGATIVE

## 2019-09-19 LAB — SALICYLATE LEVEL: Salicylate Lvl: <7 mg/dL — ABNORMAL LOW (ref 7.0–30.0)

## 2019-09-19 LAB — SARS CORONAVIRUS 2 BY RT PCR (HOSPITAL ORDER, PERFORMED IN ~~LOC~~ HOSPITAL LAB): SARS Coronavirus 2: NEGATIVE

## 2019-09-19 MED ORDER — LORAZEPAM 2 MG/ML IJ SOLN: 0.0000 mg | Freq: Four times a day (QID) | INTRAMUSCULAR | Status: DC

## 2019-09-19 MED ORDER — LORAZEPAM 2 MG/ML IJ SOLN: 0.0000 mg | Freq: Two times a day (BID) | INTRAMUSCULAR | Status: DC

## 2019-09-19 MED ORDER — THIAMINE HCL 100 MG PO TABS
100.0000 mg | ORAL_TABLET | Freq: Every day | ORAL | Status: DC
  Filled 2019-09-19: qty 1

## 2019-09-19 MED ORDER — LORAZEPAM 1 MG PO TABS: 0.0000 mg | ORAL_TABLET | Freq: Two times a day (BID) | ORAL | Status: DC

## 2019-09-19 MED ORDER — LORAZEPAM 1 MG PO TABS
0.0000 mg | ORAL_TABLET | Freq: Four times a day (QID) | ORAL | Status: DC
  Administered 2019-09-20: 1 mg via ORAL
  Filled 2019-09-19 (×2): qty 1
  Filled 2019-09-19: qty 2

## 2019-09-19 MED ORDER — THIAMINE HCL 100 MG/ML IJ SOLN: 100.0000 mg | Freq: Every day | INTRAMUSCULAR | Status: DC
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
## 2019-09-19 NOTE — ED Notes (Signed)
TTS at bedside. 

## 2019-09-19 NOTE — ED Notes (Signed)
Called for pt x3 with no response for vital sign recheck 
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
## 2019-09-19 NOTE — ED Notes (Signed)
Pt called x3 for vital signs. No response. Not seen in waiting room.

## 2019-09-19 NOTE — ED Provider Notes (Signed)
Monique [name] [facility] EMERGENCY DEPARTMENT Provider Note   CSN: [id number] Arrival date & time: 09/19/19  0056     History Chief Complaint  Patient presents with   Knee Injury    AUTYM SIESS is a 44 y.o. 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  Patient presented via EMS around 1 AM last night after a fall.  Patient reports that she was walking around and was sweaty when her right leg slipped out of her prosthesis causing her to fall.  She reports she has had pain of her right knee since the fall a diffuse aching sensation moderate intensity nonradiating worsened with movement improved with rest.  She denies head injury, loss of consciousness, blood thinner use, neck pain, chest pain, abdominal pain, nausea/vomiting, numbness/weakness, tingling or any additional concerns.  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) 04/14/2015   Polysubstance abuse (HCC) 03/15/2015   Phantom pain (HCC) 03/15/2015   Elevated lactic acid level 03/15/2015   BKA stump complication (HCC)    Fever    Osteomyelitis (HCC) 03/02/2015   Cellulitis 03/02/2015   Cellulitis and abscess 12/17/2014   Nicotine abuse 12/17/2014   Anemia, chronic disease 12/17/2014   Conjunctivitis, left eye    Wound infection after  surgery 09/29/2014   Irritation of left eye 09/29/2014   Wound, surgical, infected 09/29/2014   Acute encephalopathy 08/29/2014   Drug abuse (HCC) 08/29/2014   Cocaine abuse (HCC) 08/29/2014   Acute respiratory failure with hypoxia (HCC) 08/29/2014    Past Surgical History:  Procedure Laterality Date   AMPUTATION Right 03/04/2015   Procedure: Right Below Knee Amputation;  Surgeon: [name], MD;  Location: [facility] OR;  Service: Orthopedics;  Laterality: Right;   ANKLE FRACTURE SURGERY Right 82016   ANKLE FUSION Right 01/19/2015   Procedure: Right Tibiotalar Fusion, Place Antibiotic Beads;  Surgeon: [name], MD;  Location: [facility] OR;  Service: Orthopedics;  Laterality: Right;   DILATION AND CURETTAGE OF UTERUS  X 2   EXTERNAL FIXATION LEG Right 01/19/2015   Procedure: EXTERNAL FIXATION Right Ankle;  Surgeon: [name], MD;  Location: [facility] OR;  Service: Orthopedics;  Laterality: Right;   EXTERNAL FIXATION REMOVAL Right 03/04/2015   Procedure: REMOVAL EXTERNAL FIXATION Right Lower Leg;  Surgeon: [name], MD;  Location: [facility] OR;  Service: Orthopedics;  Laterality: Right;   FRACTURE SURGERY     HARDWARE REMOVAL Right 09/02/2014   Procedure: Removal Right Lateral Ankle Hardware, Place Antibiotic Bead ;  Surgeon: [name], MD;  Location: [facility] ORS;  Service: Orthopedics;  Laterality: Right;   INCISION AND DRAINAGE ABSCESS Right 12/19/2014   Procedure: INCISION AND  DRAINAGE OF ABSCESS ON RIGHT SIDE OF CHIN;  Surgeon: [name], MD;  Location: [facility] OR;  Service: ENT;  Laterality: Right;   STUMP REVISION Right 04/14/2015   Procedure: RIGHT BELOW KNEE AMPUTATION REVISION;  Surgeon: [name], MD;  Location: [facility] OR;  Service: Orthopedics;  Laterality: Right;   STUMP REVISION Right 07/11/2016   Procedure: Revision Right Below Knee Amputation, Fibula;  Surgeon: [name], MD;  Location: [facility] OR;  Service: Orthopedics;  Laterality: Right;   TUBAL LIGATION       OB History    No obstetric history on file.     Family History  Problem Relation Age of Onset   Lung cancer Mother    Heart disease Mother        MI and lung cancer were cause of death   Cancer Maternal Uncle    Cancer Maternal Grandfather    Cancer Paternal Grandfather     Social History   Tobacco Use   Smoking status: Current Every Day Smoker    Packs/day: 1.00    Years: 23.00    Pack years: 23.00    Types: Cigarettes   Smokeless tobacco: Never Used  Vaping Use   Vaping Use: Never used  Substance Use Topics   Alcohol use: Yes    Comment: Occasional use--celebrations   Drug use: Yes    Types: Cocaine, Marijuana, Benzodiazepines    Comment: drug screen +  benzos,cocaine, marijuana pt denies using ( denies curent use 12/17/14) Denies 2020    Home Medications Prior to Admission medications   Medication Sig Start Date End Date Taking? Authorizing Provider  ALPRAZolam Prudy Feeler) 1 MG tablet Take 1 tablet (1 mg total) by mouth 3 (three) times daily as needed for anxiety. 09/07/19   [name], MD  amitriptyline (ELAVIL) 150 MG tablet 150 mg at bedtime.  09/02/18   [provider]  buPROPion (WELLBUTRIN SR) 100 MG 12 hr tablet Take 1 tablet (100 mg total) by mouth 2 (two) times daily. 03/24/15   Henrietta Hoover, NP  cyclobenzaprine (FLEXERIL) 10 MG tablet TAKE 1 TABLET BY MOUTH THREE TIMES DAILY AS NEEDED FOR MUSCLE SPASMS 12/24/16   [name], MD  docusate sodium (COLACE) 100 MG capsule Take 1 capsule (100 mg total) by mouth 2 (two) times daily. 07/13/16   Rodolph Bong, MD  ferrous sulfate 325 (65 FE) MG tablet Take 1 tablet (325 mg total) by mouth daily with breakfast. 03/28/15   Henrietta Hoover, NP  gabapentin (NEURONTIN) 800 MG tablet 3 (three) times daily.  09/02/18   [provider]  ketorolac (TORADOL) 10 MG tablet Take 1 tablet (10 mg total) by mouth every 6 (six) hours as needed. 07/30/16   [name], MD  methocarbamol (ROBAXIN) 500 MG  tablet Take 1 tablet (500 mg total) by mouth every 6 (six) hours as needed for muscle spasms. 03/24/15   Henrietta Hoover, NP  mupirocin ointment (BACTROBAN) 2 % Apply 1 application topically 2 (two) times daily. Apply to the affected area 2 times a day 09/29/18   [name], MD  oxyCODONE-acetaminophen (PERCOCET) 10-325 MG tablet Take 1 tablet by mouth every 8 (eight) hours as needed for pain. 09/07/19   [name], MD  traMADol (ULTRAM) 50 MG tablet Take 1 tablet (50 mg total) by mouth every 6 (six) hours as needed for moderate pain. 10/16/18   [name], MD  valACYclovir (VALTREX) 500 MG tablet daily.  09/02/18   [provider]  VYVANSE 50 MG capsule 50 mg daily.  08/14/18   [provider]    Allergies    No known allergies  Review of Systems   Review of Systems Ten systems are reviewed and are negative for acute change except as noted in the HPI  Physical Exam Updated Vital Signs BP (!) 130/75 (BP Location: Left Arm)    Pulse 92    Temp 99.2 F (37.3 C) (Oral)    Resp 18    LMP 08/12/2019    SpO2 98%   Physical Exam Constitutional:      General: She is not in acute distress.    Appearance: Normal appearance. She is well-developed. She is obese. She is not ill-appearing or diaphoretic.  HENT:     Head: Normocephalic and atraumatic. No raccoon eyes or Battle's sign.     Jaw: There is normal jaw occlusion.     Right Ear: External ear normal. No hemotympanum.     Left Ear: External ear normal. No hemotympanum.     Nose: Nose normal.     Mouth/Throat:     Comments: No sign of dental injury Eyes:     General: Vision grossly intact. Gaze aligned appropriately.     Extraocular Movements: Extraocular movements intact.     Pupils: Pupils are equal, round, and reactive to light.  Neck:     Trachea: Trachea and phonation normal.  Cardiovascular:     Rate and Rhythm: Normal rate and regular rhythm.  Pulmonary:     Effort: Pulmonary effort is normal. No  respiratory distress.     Breath sounds: Normal air entry.  Abdominal:     General: There is no distension.     Palpations: Abdomen is soft.     Tenderness: There is no abdominal tenderness. There is no guarding or rebound.  Musculoskeletal:        General: Normal range of motion.     Cervical back: Normal range of motion and neck supple. No spinous process tenderness or muscular tenderness.     Comments: No midline C/T/L spinal tenderness to palpation, no paraspinal muscle tenderness, no deformity, crepitus, or step-off noted.  Spontaneous movement of the bilateral upper extremities without pain, appropriate range of motion and strength with all major joints of the upper extremities.  Spontaneous movement with appropriate range of motion and strength while major joints of the left lower extremity.  Right BKA, she is able to flex and extend at the right knee without evidence of pain.  No skin breaks.     Right Lower Extremity: Right leg is amputated below knee.  Skin:    General: Skin is warm and dry.  Neurological:     Mental Status: She is alert.     GCS: GCS eye subscore is 4. GCS verbal subscore is 5. GCS motor subscore is 6.     Comments: Speech is clear and goal oriented, follows commands Major Cranial nerves without deficit, no facial droop Moves extremities without ataxia, coordination intact  Psychiatric:        Behavior: Behavior normal.    ED Results / Procedures / Treatments   Labs (all labs ordered are listed, but only abnormal results are displayed) Labs Reviewed  SARS CORONAVIRUS 2 BY RT PCR (HOSPITAL ORDER, PERFORMED IN [facility])  COMPREHENSIVE METABOLIC PANEL  ETHANOL  RAPID URINE DRUG SCREEN, HOSP PERFORMED  CBC WITH DIFFERENTIAL/PLATELET  ACETAMINOPHEN LEVEL  SALICYLATE LEVEL  I-STAT  BETA HCG BLOOD, ED ([facility], [facility], [facility] ONLY)    EKG None  Radiology DG Knee Complete 4 Views Right  Result Date: 09/19/2019 CLINICAL DATA:  Pain status post fall  EXAM: RIGHT KNEE - COMPLETE 4+ VIEW COMPARISON:  September 07, 2019 FINDINGS: There is no acute displaced fracture or dislocation. The patient is status post prior below-knee amputation. There is no significant joint effusion. There is osteopenia. IMPRESSION: 1. No acute displaced fracture or dislocation. 2. Status post below-knee amputation. Electronically Signed   By: Katherine Mantlehristopher  Green M.D.   On: 09/19/2019 01:47    Procedures Procedures (including critical care time)  Medications Ordered in ED Medications - No data to display  ED Course  I have reviewed the triage vital signs and the nursing notes.  Pertinent labs & imaging results that were available during my care of the patient were reviewed by me and considered in my medical decision making (see chart for details).    MDM Rules/Calculators/A&P                          Additional history obtained from: 1. Nursing notes from this visit. 2. Electronic medical record reviewed. -------------------------------------------- 44 year old female presents today following right knee injury when she slipped out of her prosthesis last night.  On examination there is no evidence of significant injury, she is good range of motion at the knee.  She has a right BKA.  No skin breaks.  No evidence of cellulitis, septic arthritis, DVT, compartment syndrome or acute trauma.  X-ray obtained in triage reviewed below.  DG Right Knee:  IMPRESSION:  1. No acute displaced fracture or dislocation.  2. Status post below-knee amputation.  ----------- On my evaluation patient is sleeping heavily, arousable to touch.  She is alert and oriented but appears intoxicated, she endorses drug and alcohol use last night.  She reports that she "walked in on something" but will not discuss with me what that was.  She is requesting to speak to a Child psychotherapistsocial worker.  Cranial nerves intact, no meningeal signs, no evidence of significant injury of the head, neck, chest, abdomen, back,  pelvis or other extremities.  Patient is requesting food and drink. No additional imaging indicated at this time. - Patient seen by Child psychotherapistsocial worker, she was given resources for a place to stay. - Patient reassessed, sleeping comfortably no acute distress he is arousable to voice.  I asked if patient was ready to be discharged.  At that time she reported that she was having thoughts of suicide, she plans to cut herself.  She denies any current injuries or ingestions denies hallucinations or homicidal ideations.  Suicide precautions placed, will obtain screening labs and consult TTS. - I have reviewed and interpreted the following labs: Ethanol level negative, patient does not appear acutely intoxicated or in withdrawal. Salicylate level negative, no evidence of ingestion. Tylenol level negative no evidence of ingestion. CBC shows hemoglobin 11.2 which appears similar to prior, no leukocytosis to suggest infection. CBC shows mild elevation of LFTs, no nausea vomiting or abdominal pain suspect possibly secondary to intake.  No emergent electrolyte derangement, AKI or gap. Covid test pending Pregnancy test negative. - Patient reassessed multiple times during this visit she is given food, tolerating without difficulty.  She is in no acute distress on reevaluation, plan to consult TTS for psychiatric evaluation.   At this time there does not appear to be any evidence of an acute emergency medical condition  and the patient appears stable for discharge for psychiatric evaluation.  Note: Portions of this report may have been transcribed using voice recognition software. Every effort was made to ensure accuracy; however, inadvertent computerized transcription errors may still be present. Final Clinical Impression(s) / ED Diagnoses Final diagnoses:  Right knee pain, unspecified chronicity  Fall, initial encounter  Suicidal ideations    Rx / DC Orders ED Discharge Orders    None       Elizabeth Palau 09/19/19 1500    Wynetta Fines, MD 09/21/19 1458

## 2019-09-20 ENCOUNTER — Encounter (HOSPITAL_COMMUNITY): Payer: Self-pay | Admitting: Emergency Medicine

## 2019-09-20 ENCOUNTER — Other Ambulatory Visit: Payer: Self-pay

## 2019-09-20 ENCOUNTER — Ambulatory Visit (HOSPITAL_COMMUNITY)
Admission: EM | Admit: 2019-09-20 | Discharge: 2019-09-21 | Disposition: A | Discharge status: Home / Self Care | Payer: Medicare HMO | Attending: Nurse Practitioner | Admitting: Nurse Practitioner

## 2019-09-20 DIAGNOSIS — G43909 Migraine, unspecified, not intractable, without status migrainosus: Secondary | ICD-10-CM | POA: Insufficient documentation

## 2019-09-20 DIAGNOSIS — F1994 Other psychoactive substance use, unspecified with psychoactive substance-induced mood disorder: Secondary | ICD-10-CM | POA: Insufficient documentation

## 2019-09-20 DIAGNOSIS — F17210 Nicotine dependence, cigarettes, uncomplicated: Secondary | ICD-10-CM | POA: Insufficient documentation

## 2019-09-20 DIAGNOSIS — F19959 Other psychoactive substance use, unspecified with psychoactive substance-induced psychotic disorder, unspecified: Secondary | ICD-10-CM | POA: Diagnosis not present

## 2019-09-20 DIAGNOSIS — J45909 Unspecified asthma, uncomplicated: Secondary | ICD-10-CM | POA: Insufficient documentation

## 2019-09-20 DIAGNOSIS — Z20822 Contact with and (suspected) exposure to covid-19: Secondary | ICD-10-CM | POA: Insufficient documentation

## 2019-09-20 DIAGNOSIS — F319 Bipolar disorder, unspecified: Secondary | ICD-10-CM | POA: Insufficient documentation

## 2019-09-20 DIAGNOSIS — Z89511 Acquired absence of right leg below knee: Secondary | ICD-10-CM | POA: Insufficient documentation

## 2019-09-20 DIAGNOSIS — F419 Anxiety disorder, unspecified: Secondary | ICD-10-CM | POA: Insufficient documentation

## 2019-09-20 DIAGNOSIS — K219 Gastro-esophageal reflux disease without esophagitis: Secondary | ICD-10-CM | POA: Insufficient documentation

## 2019-09-20 DIAGNOSIS — F4310 Post-traumatic stress disorder, unspecified: Secondary | ICD-10-CM | POA: Insufficient documentation

## 2019-09-20 DIAGNOSIS — F1490 Cocaine use, unspecified, uncomplicated: Secondary | ICD-10-CM | POA: Insufficient documentation

## 2019-09-20 DIAGNOSIS — R45851 Suicidal ideations: Secondary | ICD-10-CM | POA: Insufficient documentation

## 2019-09-20 DIAGNOSIS — Z7289 Other problems related to lifestyle: Secondary | ICD-10-CM | POA: Insufficient documentation

## 2019-09-20 LAB — HEMOGLOBIN A1C
Hgb A1c MFr Bld: 7 % — ABNORMAL HIGH (ref 4.8–5.6)
Mean Plasma Glucose: 154.2 mg/dL

## 2019-09-20 LAB — LIPID PANEL
Cholesterol: 178 mg/dL (ref 0–200)
HDL: 32 mg/dL — ABNORMAL LOW (ref >40)
LDL Cholesterol: 91 mg/dL (ref 0–99)
Total CHOL/HDL Ratio: 5.6 RATIO
Triglycerides: 273 mg/dL — ABNORMAL HIGH (ref <150)
VLDL: 55 mg/dL — ABNORMAL HIGH (ref 0–40)

## 2019-09-20 LAB — TSH: TSH: 1.599 u[IU]/mL (ref 0.350–4.500)

## 2019-09-20 MED ORDER — ALUM & MAG HYDROXIDE-SIMETH 200-200-20 MG/5ML PO SUSP: 30.0000 mL | ORAL | Status: DC | PRN

## 2019-09-20 MED ORDER — CLONIDINE HCL 0.1 MG PO TABS
0.1000 mg | ORAL_TABLET | Freq: Four times a day (QID) | ORAL | Status: DC
  Administered 2019-09-20 – 2019-09-21 (×3): 0.1 mg via ORAL
  Filled 2019-09-20 (×3): qty 1

## 2019-09-20 MED ORDER — CLONIDINE HCL 0.1 MG PO TABS: 0.1000 mg | ORAL_TABLET | ORAL | Status: DC

## 2019-09-20 MED ORDER — MAGNESIUM HYDROXIDE 400 MG/5ML PO SUSP: 30.0000 mL | Freq: Every day | ORAL | Status: DC | PRN

## 2019-09-20 MED ORDER — METHOCARBAMOL 500 MG PO TABS
500.0000 mg | ORAL_TABLET | Freq: Three times a day (TID) | ORAL | Status: DC | PRN
  Administered 2019-09-20: 500 mg via ORAL
  Filled 2019-09-20: qty 1

## 2019-09-20 MED ORDER — RISPERIDONE 1 MG PO TABS
1.0000 mg | ORAL_TABLET | Freq: Every day | ORAL | Status: DC
  Administered 2019-09-20: 1 mg via ORAL
  Filled 2019-09-20: qty 7
  Filled 2019-09-20: qty 1

## 2019-09-20 MED ORDER — ONDANSETRON 4 MG PO TBDP
4.0000 mg | ORAL_TABLET | Freq: Four times a day (QID) | ORAL | Status: DC | PRN
  Administered 2019-09-20: 4 mg via ORAL
  Filled 2019-09-20: qty 1

## 2019-09-20 MED ORDER — THIAMINE HCL 100 MG PO TABS
100.0000 mg | ORAL_TABLET | Freq: Every day | ORAL | Status: DC
  Administered 2019-09-21: 100 mg via ORAL
  Filled 2019-09-20: qty 1

## 2019-09-20 MED ORDER — CLONIDINE HCL 0.1 MG PO TABS: 0.1000 mg | ORAL_TABLET | Freq: Every day | ORAL | Status: DC

## 2019-09-20 MED ORDER — HYDROXYZINE HCL 25 MG PO TABS
25.0000 mg | ORAL_TABLET | Freq: Four times a day (QID) | ORAL | Status: DC | PRN
  Administered 2019-09-20: 25 mg via ORAL
  Filled 2019-09-20: qty 1

## 2019-09-20 MED ORDER — ACETAMINOPHEN 325 MG PO TABS: 650.0000 mg | ORAL_TABLET | Freq: Four times a day (QID) | ORAL | Status: DC | PRN

## 2019-09-20 MED ORDER — LORAZEPAM 1 MG PO TABS
1.0000 mg | ORAL_TABLET | Freq: Four times a day (QID) | ORAL | Status: DC | PRN
  Administered 2019-09-20 (×2): 1 mg via ORAL
  Filled 2019-09-20 (×2): qty 1

## 2019-09-20 MED ORDER — GABAPENTIN 400 MG PO CAPS
800.0000 mg | ORAL_CAPSULE | Freq: Three times a day (TID) | ORAL | Status: DC
  Administered 2019-09-20 – 2019-09-21 (×4): 800 mg via ORAL
  Filled 2019-09-20 (×4): qty 2
  Filled 2019-09-20 (×2): qty 42

## 2019-09-20 MED ORDER — ADULT MULTIVITAMIN W/MINERALS CH
1.0000 | ORAL_TABLET | Freq: Every day | ORAL | Status: DC
  Administered 2019-09-20 – 2019-09-21 (×2): 1 via ORAL
  Filled 2019-09-20 (×2): qty 1

## 2019-09-20 MED ORDER — DICYCLOMINE HCL 20 MG PO TABS: 20.0000 mg | ORAL_TABLET | Freq: Four times a day (QID) | ORAL | Status: DC | PRN

## 2019-09-20 MED ORDER — GABAPENTIN 400 MG PO CAPS: 800.0000 mg | ORAL_CAPSULE | Freq: Three times a day (TID) | ORAL | Status: DC

## 2019-09-20 MED ORDER — NICOTINE 21 MG/24HR TD PT24
21.0000 mg | MEDICATED_PATCH | Freq: Once | TRANSDERMAL | Status: DC
  Administered 2019-09-20: 21 mg via TRANSDERMAL
  Filled 2019-09-20: qty 1

## 2019-09-20 MED ORDER — LORAZEPAM 1 MG PO TABS
1.0000 mg | ORAL_TABLET | Freq: Once | ORAL | Status: AC
  Administered 2019-09-20: 1 mg via ORAL
  Filled 2019-09-20: qty 1

## 2019-09-20 MED ORDER — NAPROXEN 500 MG PO TABS
500.0000 mg | ORAL_TABLET | Freq: Two times a day (BID) | ORAL | Status: DC | PRN
  Administered 2019-09-20: 500 mg via ORAL
  Filled 2019-09-20: qty 1

## 2019-09-20 MED ORDER — LOPERAMIDE HCL 2 MG PO CAPS: 2.0000 mg | ORAL_CAPSULE | ORAL | Status: DC | PRN
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
## 2019-09-20 NOTE — ED Triage Notes (Signed)
[facility] transfer, presents with SI, HI, hearing voices.

## 2019-09-20 NOTE — ED Notes (Signed)
Pt belongings in locker #26 

## 2019-09-20 NOTE — ED Notes (Signed)
While escorting pt to Safe[facility]Transport vehicle, pt stated "If they don't keep me tomorrow, I'm coming right back here."

## 2019-09-20 NOTE — ED Notes (Signed)
Pt has been sleeping comfortably and awoke asking for something to eat. Pt was given a salad to eat. Pt sitting on her chair bed pleasant and cooperative. Pt continues to be monitored for safety. Pt remains safe on the unit.

## 2019-09-20 NOTE — Progress Notes (Signed)
Patient meets criteria for inpatient treatment per Marciano Sequin NP. There are no available beds at [facility] currently. CSW faxed referrals to the following facilities for review:  [facility]  TTS will continue to seek bed placement.   Trula Slade, MSW, LCSW Clinical Social Worker 09/20/2019 1:00 PM
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
## 2019-09-20 NOTE — ED Notes (Signed)
Round conducted on patient during shift change, Patient is stilling on her bed quite, comfortable and relaxing.

## 2019-09-20 NOTE — ED Notes (Signed)
Pt is asleep on her chair bed with even and unlabored respirations. No discomfort or distress noted. Pt remains safe on the unit
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
## 2019-09-20 NOTE — ED Notes (Signed)
Called safety transport

## 2019-09-20 NOTE — ED Notes (Signed)
Pt A&O x 4, moderately anxious at present. Restless, comfort measures given.  Pain & anxiety meds given.  Monitoring for safety.

## 2019-09-20 NOTE — ED Notes (Signed)
Lying in bed resting with eyes closed. Rise and fall of chest noted.

## 2019-09-20 NOTE — ED Notes (Signed)
Report given to Lilian Kapur RN at [facility]; Nira Conn NP accepting. Pt aware, SAFE transport called

## 2019-09-20 NOTE — ED Notes (Signed)
Pt sleeping at present, no distress noted, resting comfortably, respirations even & unlabored.  Monitoring for safety.

## 2019-09-21 DIAGNOSIS — F319 Bipolar disorder, unspecified: Secondary | ICD-10-CM | POA: Diagnosis not present

## 2019-09-21 DIAGNOSIS — F1994 Other psychoactive substance use, unspecified with psychoactive substance-induced mood disorder: Secondary | ICD-10-CM | POA: Diagnosis not present

## 2019-09-21 DIAGNOSIS — F1490 Cocaine use, unspecified, uncomplicated: Secondary | ICD-10-CM | POA: Diagnosis not present

## 2019-09-21 DIAGNOSIS — F419 Anxiety disorder, unspecified: Secondary | ICD-10-CM | POA: Diagnosis not present

## 2019-09-21 DIAGNOSIS — Z7289 Other problems related to lifestyle: Secondary | ICD-10-CM | POA: Diagnosis not present

## 2019-09-21 DIAGNOSIS — K219 Gastro-esophageal reflux disease without esophagitis: Secondary | ICD-10-CM | POA: Diagnosis not present

## 2019-09-21 DIAGNOSIS — Z89511 Acquired absence of right leg below knee: Secondary | ICD-10-CM | POA: Diagnosis not present

## 2019-09-21 DIAGNOSIS — R45851 Suicidal ideations: Secondary | ICD-10-CM | POA: Diagnosis not present

## 2019-09-21 DIAGNOSIS — J45909 Unspecified asthma, uncomplicated: Secondary | ICD-10-CM | POA: Diagnosis not present

## 2019-09-21 DIAGNOSIS — F17210 Nicotine dependence, cigarettes, uncomplicated: Secondary | ICD-10-CM | POA: Diagnosis not present

## 2019-09-21 DIAGNOSIS — G43909 Migraine, unspecified, not intractable, without status migrainosus: Secondary | ICD-10-CM | POA: Diagnosis not present

## 2019-09-21 DIAGNOSIS — Z20822 Contact with and (suspected) exposure to covid-19: Secondary | ICD-10-CM | POA: Diagnosis not present

## 2019-09-21 DIAGNOSIS — F4310 Post-traumatic stress disorder, unspecified: Secondary | ICD-10-CM | POA: Diagnosis not present

## 2019-09-21 MED ORDER — METFORMIN HCL ER 500 MG PO TB24: 500.0000 mg | ORAL_TABLET | Freq: Every day | ORAL | 0 refills | Status: AC

## 2019-09-21 MED ORDER — RISPERIDONE 1 MG PO TABS: 1.0000 mg | ORAL_TABLET | Freq: Every day | ORAL | 0 refills | Status: AC

## 2019-09-21 MED ORDER — METFORMIN HCL ER 500 MG PO TB24
500.0000 mg | ORAL_TABLET | Freq: Every day | ORAL | Status: DC
  Administered 2019-09-21: 500 mg via ORAL
  Filled 2019-09-21: qty 1
  Filled 2019-09-21: qty 7

## 2019-09-21 MED ORDER — GABAPENTIN 400 MG PO CAPS: 800.0000 mg | ORAL_CAPSULE | Freq: Three times a day (TID) | ORAL | 0 refills | Status: DC

## 2019-09-21 NOTE — Telephone Encounter (Signed)
This number provided is a non working number. Unable to reach pt.

## 2019-09-21 NOTE — Progress Notes (Signed)
Monique [name] was transported by [employer] with her prescriptions and personal belongings.

## 2019-09-21 NOTE — ED Notes (Signed)
Received Monique [name] this AM in her chair bed asleep, she was awaken to talk with the NP to arrange her discharge plans. She wanted to make a couple of stops prior to going to the [facility].

## 2019-09-21 NOTE — Telephone Encounter (Signed)
We cannot refill any further narcotics in July, we could give her one prescription for August.

## 2019-09-21 NOTE — ED Provider Notes (Signed)
Patient's HgbA!C is 7.0. Plan to start Glucophage 500 mg daily. Placed diabetes coordinator consult.

## 2019-09-21 NOTE — Discharge Instructions (Addendum)
Continue current medications as prescribed. Follow up with the [facility].
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
## 2019-09-21 NOTE — ED Notes (Signed)
Pt sleeping at present, no distress noted, resting comfortably.  Monitoring for safety. 

## 2019-10-05 ENCOUNTER — Telehealth: Payer: Self-pay | Admitting: Orthopedic Surgery

## 2019-10-05 NOTE — Telephone Encounter (Signed)
Pt called stating she missed place her rx for biotech and would like to know if we could rewrite it and she come pick it up?   [phone]

## 2019-10-05 NOTE — Telephone Encounter (Signed)
I called pt to advise that rx will be at the front desk tomorrow afternoon for pick up.

## 2019-10-16 ENCOUNTER — Encounter: Payer: Medicare HMO | Attending: Physical Medicine and Rehabilitation | Admitting: Physical Medicine and Rehabilitation

## 2020-02-09 ENCOUNTER — Telehealth: Payer: Self-pay

## 2020-02-09 NOTE — Telephone Encounter (Signed)
Received a fax from [employer] requesting the office notes pertaining to the rx written on 10/02/19 for prosthetic supplies. Faxed back the 09/07/19 office note which was the note where the rx was written. According to the chart the pt lost the rx and called back in on 10/05/19 and new one was written.

## 2020-03-15 ENCOUNTER — Telehealth: Payer: Self-pay

## 2020-03-15 NOTE — Telephone Encounter (Signed)
error 

## 2020-03-17 ENCOUNTER — Telehealth: Payer: Self-pay

## 2020-03-17 NOTE — Telephone Encounter (Signed)
Pt would like another referral to be sent into her pain management clinic

## 2020-03-18 ENCOUNTER — Ambulatory Visit: Payer: Medicare HMO | Admitting: Physician Assistant

## 2020-03-18 ENCOUNTER — Other Ambulatory Visit: Payer: Self-pay

## 2020-03-18 DIAGNOSIS — Z89511 Acquired absence of right leg below knee: Secondary | ICD-10-CM

## 2020-03-18 NOTE — Telephone Encounter (Signed)
Ok per MD to enter referral for pain management

## 2020-03-29 ENCOUNTER — Ambulatory Visit: Payer: Medicare HMO | Admitting: Orthopedic Surgery

## 2020-04-01 ENCOUNTER — Telehealth: Payer: Self-pay | Admitting: Radiology

## 2020-04-01 NOTE — Telephone Encounter (Signed)
Voicemail left on my extension from [place] with [facility] in regards to patient's referral. She states that this appears as a duplicate and she would like a call back from you to discuss.  CB [phone]

## 2020-04-05 ENCOUNTER — Ambulatory Visit (INDEPENDENT_AMBULATORY_CARE_PROVIDER_SITE_OTHER): Payer: Medicare HMO | Admitting: Orthopedic Surgery

## 2020-04-05 DIAGNOSIS — Z89511 Acquired absence of right leg below knee: Secondary | ICD-10-CM | POA: Diagnosis not present

## 2020-04-05 MED ORDER — IBUPROFEN 800 MG PO TABS: 800.0000 mg | ORAL_TABLET | Freq: Three times a day (TID) | ORAL | 0 refills | Status: DC | PRN

## 2020-04-05 MED ORDER — ALPRAZOLAM 1 MG PO TABS: 1.0000 mg | ORAL_TABLET | Freq: Three times a day (TID) | ORAL | 0 refills | Status: DC | PRN

## 2020-04-05 MED ORDER — GABAPENTIN 400 MG PO CAPS: 400.0000 mg | ORAL_CAPSULE | Freq: Three times a day (TID) | ORAL | 0 refills | Status: DC

## 2020-04-05 NOTE — Telephone Encounter (Signed)
Can we cancel orders for pain management? Dr. [name] would like for the pt to establish with PCP and get back on her regular medications. They discussed this at her visit in the office today.

## 2020-04-18 ENCOUNTER — Encounter: Payer: Self-pay | Admitting: Orthopedic Surgery

## 2020-04-18 NOTE — Progress Notes (Signed)
Office Visit Note   Patient: Monique [name]           Date of Birth: 02-Sep-1975           MRN: [id number] Visit Date: 04/05/2020              Requested by: No referring provider defined for this encounter. PCP: Patient, [name]  Chief Complaint  Patient presents with  . Right Leg - Follow-up    07/11/2016 revision BKA       HPI: Patient is a 45 year old woman who is status post a right tibial amputation in May 2018.  Patient states that she has started working and has increased pain in her limb.  Patient is inquiring whether she should go to pain management.  She states she has to walk to the bus and is on her feet all day long at work.  Patient states she has lost 40 pounds.  Assessment & Plan: Visit Diagnoses:  1. S/P unilateral BKA (below knee amputation), right (HCC)     Plan: Is given a for new liners possibly a new socket.  She is given prescriptions for her Neurontin Xanax and ibuprofen she states she is not on any of her medications at this time she will [facility] with [facility] in [place].    Follow-U cushion for bi[employer]p Instructions: Return if symptoms worsen or fail to improve.   Ortho Exam  Patient is alert, oriented, no adenopathy, well-dressed, normal affect, normal respiratory effort. Examination patient is subsiding in her socket due to weight loss and loss of residual volume in her limb.  She is at risk of ulceration.  She will need a new liner in the socket.  There is no redness no cellulitis no signs of infection no open ulcers.  Imaging: No results found. No images are attached to the encounter.  Labs: Lab Results  Component Value Date   HGBA1C 7.0 (H) 09/20/2019   HGBA1C 5.5 07/09/2016   HGBA1C 5.6 03/02/2015   ESRSEDRATE 6 07/08/2016   ESRSEDRATE 80 (H) 03/02/2015   ESRSEDRATE 88 (H) 02/16/2015   CRP <0.8 07/08/2016   CRP 1.7 (H) 03/02/2015   CRP 2.0 (H) 02/16/2015   REPTSTATUS 08/20/2016 FINAL 08/15/2016    GRAMSTAIN  12/19/2014    NO WBC SEEN NO SQUAMOUS EPITHELIAL CELLS SEEN NO ORGANISMS SEEN Performed at [facility]    GRAMSTAIN  12/19/2014    NO WBC SEEN NO SQUAMOUS EPITHELIAL CELLS SEEN NO ORGANISMS SEEN Performed at [facility]    CULT NO GROWTH 5 DAYS 08/15/2016   LABORGA STAPHYLOCOCCUS AUREUS 12/19/2014     Lab Results  Component Value Date   ALBUMIN 3.6 09/19/2019   ALBUMIN 3.8 08/25/2017   ALBUMIN 4.0 06/27/2017   PREALBUMIN 27.0 03/02/2015    Lab Results  Component Value Date   MG 2.1 07/09/2016   MG 1.8 03/02/2015   No results found for: VD25OH  Lab Results  Component Value Date   PREALBUMIN 27.0 03/02/2015   CBC EXTENDED Latest Ref Rng & Units 09/19/2019 08/25/2017 08/25/2017  WBC 4.0 - 10.5 K/uL 5.0 - 7.1  RBC 3.87 - 5.11 MIL/uL 4.82 - 4.68  HGB 12.0 - 15.0 g/dL 11.2(L) 12.2 10.9(L)  HCT 36.0 - 46.0 % 40.4 36.0 37.8  PLT 150 - 400 K/uL 386 - 410(H)  NEUTROABS 1.7 - 7.7 K/uL 2.8 - -  LYMPHSABS 0.7 - 4.0 K/uL 1.6 - -     There  is no height or weight on file to calculate BMI.  Orders:  No orders of the defined types were placed in this encounter.  Meds ordered this encounter  Medications  . gabapentin (NEURONTIN) 400 MG capsule    Sig: Take 1 capsule (400 mg total) by mouth 3 (three) times daily.    Dispense:  90 capsule    Refill:  0  . ALPRAZolam (XANAX) 1 MG tablet    Sig: Take 1 tablet (1 mg total) by mouth 3 (three) times daily as needed for anxiety.    Dispense:  30 tablet    Refill:  0  . ibuprofen (ADVIL) 800 MG tablet    Sig: Take 1 tablet (800 mg total) by mouth every 8 (eight) hours as needed.    Dispense:  30 tablet    Refill:  0     Procedures: No procedures performed  Clinical Data: No additional findings.  ROS:  All other systems negative, except as noted in the HPI. Review of Systems  Objective: Vital Signs: There were no vitals taken for this visit.  Specialty Comments:  No specialty comments  available.  PMFS History: Patient Active Problem List   Diagnosis Date Noted  . History of osteomyelitis   . Depression with anxiety   . Hypokalemia 07/08/2016  . Hyponatremia 07/08/2016  . 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 Polysubstance abuse (HCC) 03/15/2015  . Phantom pain (HCC) 03/15/2015  . Elevated lactic acid level 03/15/2015  . BKA stump complication (HCC)   . Fever   . Osteomyelitis (HCC) 03/02/2015  . Cellulitis 03/02/2015  . Cellulitis and abscess 12/17/2014  . Nicotine abuse 12/17/2014  . Anemia, chronic disease 12/17/2014  . Conjunctivitis, left eye   . Wound infection after surgery 09/29/2014  . Irritation of left eye 09/29/2014  . Wound, surgical, infected 09/29/2014  . Acute encephalopathy 08/29/2014  . Drug abuse (HCC) 08/29/2014  . Cocaine abuse (HCC) 08/29/2014  . Acute respiratory failure with hypoxia (HCC) 08/29/2014   Past Medical History:  Diagnosis Date  . Anemia   . Anxiety   . Arthritis   . Asthma    [facility]  . Bipolar disorder (HCC)   . Chronic back pain    "middle and lower" (12/17/2014)  . Depression   . GERD (gastroesophageal reflux disease)   . H/O blood clots   . Infection right ankle  . Migraine    "weekly" (12/17/2014)  . MRSA infection   . Nerve damage of right foot   . Osteomyelitis of ankle (HCC)    right    Family History  Problem Relation Age of Onset  . Lung cancer Mother   . Heart disease Mother        MI and lung cancer were cause of death  . Cancer Maternal Uncle   . Cancer Maternal Grandfather   . Cancer Paternal Grandfather     Past Surgical History:  Procedure Laterality Date  . AMPUTATION Right 03/04/2015   Procedure: Right Below Knee Amputation;  Surgeon: [name], MD;  Location: [facility] OR;  Service: Orthopedics;  Laterality: Right;  . ANKLE FRACTURE SURGERY Right 82016  . ANKLE FUSION Right 01/19/2015   Procedure: Right Tibiotalar Fusion, Place Antibiotic Beads;  Surgeon: [name], MD;  Location: [facility] OR;  Service: Orthopedics;  Laterality: Right;  . DILATION AND CURETTAGE OF UTERUS  X 2  . EXTERNAL FIXATION LEG Right 01/19/2015   Procedure: EXTERNAL FIXATION Right Ankle;  Surgeon: [name], MD;  Location: [facility] OR;  Service: Orthopedics;  Laterality: Right;  . EXTERNAL FIXATION REMOVAL Right 03/04/2015   Procedure: REMOVAL EXTERNAL FIXATION Right Lower Leg;  Surgeon: [name], MD;  Location: [facility] OR;  Service: Orthopedics;  Laterality: Right;  . FRACTURE SURGERY    . HARDWARE REMOVAL Right 09/02/2014   Procedure: Removal Right Lateral Ankle Hardware, Place Antibiotic Bead ;  Surgeon: [name], MD;  Location: [facility] ORS;  Service: Orthopedics;  Laterality: Right;  . INCISION AND DRAINAGE ABSCESS Right 12/19/2014   Procedure: INCISION AND DRAINAGE OF ABSCESS ON RIGHT SIDE OF CHIN;  Surgeon: [name], MD;  Location: [facility] OR;  Service: ENT;  Laterality: Right;  . STUMP REVISION Right 04/14/2015   Procedure: RIGHT BELOW KNEE AMPUTATION REVISION;  Surgeon: [name], MD;  Location: [facility] OR;  Service: Orthopedics;  Laterality: Right;  . STUMP REVISION Right 07/11/2016   Procedure: Revision Right Below Knee Amputation, Fibula;  Surgeon: [name], MD;  Location: [facility] OR;  Service: Orthopedics;  Laterality: Right;  . TUBAL LIGATION     Social History   Occupational History  . Occupation: Disabled  Tobacco Use  . Smoking status: Current Every Day Smoker    Packs/day: 1.00    Years: 23.00    Pack years: 23.00    Types: Cigarettes  . Smokeless tobacco: Never Used  Vaping Use  . Vaping Use: Never used  Substance and Sexual Activity  . Alcohol use: Yes    Comment: Occasional use--celebrations  . Drug use: Yes    Types: Cocaine, Marijuana, Benzodiazepines    Comment: drug screen +  benzos,cocaine, marijuana pt denies using ( denies curent use 12/17/14) Denies 2020  . Sexual activity: Not Currently    Birth control/protection: Surgical

## 2020-04-21 ENCOUNTER — Encounter: Payer: Self-pay | Admitting: Orthopedic Surgery

## 2020-04-21 ENCOUNTER — Ambulatory Visit (INDEPENDENT_AMBULATORY_CARE_PROVIDER_SITE_OTHER): Payer: Medicare HMO | Admitting: Physician Assistant

## 2020-04-21 ENCOUNTER — Other Ambulatory Visit: Payer: Self-pay | Admitting: Orthopedic Surgery

## 2020-04-21 DIAGNOSIS — Z89511 Acquired absence of right leg below knee: Secondary | ICD-10-CM

## 2020-04-21 MED ORDER — ALPRAZOLAM 1 MG PO TABS: 1.0000 mg | ORAL_TABLET | Freq: Three times a day (TID) | ORAL | 0 refills | Status: DC | PRN

## 2020-04-21 MED ORDER — IBUPROFEN 800 MG PO TABS: 800.0000 mg | ORAL_TABLET | Freq: Three times a day (TID) | ORAL | 0 refills | Status: DC | PRN

## 2020-04-21 NOTE — Progress Notes (Signed)
Office Visit Note   Patient: Monique [name]           Date of Birth: 1975/07/25           MRN: [id number] Visit Date: 04/21/2020              Requested by: No referring provider defined for this encounter. PCP: Patient, [name]  Chief Complaint  Patient presents with  . Right Leg - Follow-up    07/11/16 revision right BKA       HPI: Patient is status post right below-knee amputation.  She is doing well she was last had a revision 2018.  She has since gone gone back to work and is trying to find adaptive housing considering her disability.  Currently she is living in a shelter  Assessment & Plan: Visit Diagnoses:  1. S/P unilateral BKA (below knee amputation), right (HCC)     Plan: Because of the patient's disability she will need adaptive housing and financial help.  We will follow-up with [place] as needed also have refilled her medication for Xanax and ibuprofen.  Also have done referral for pain management per her request  Follow-Up Instructions: No follow-ups on file.   Ortho Exam  Patient is alert, oriented, no adenopathy, well-dressed, normal affect, normal respiratory effort. Well fitting prosthetic no issues with stump no signs of infection no signs of breakdown  Imaging: No results found. No images are attached to the encounter.  Labs: Lab Results  Component Value Date   HGBA1C 7.0 (H) 09/20/2019   HGBA1C 5.5 07/09/2016   HGBA1C 5.6 03/02/2015   ESRSEDRATE 6 07/08/2016   ESRSEDRATE 80 (H) 03/02/2015   ESRSEDRATE 88 (H) 02/16/2015   CRP <0.8 07/08/2016   CRP 1.7 (H) 03/02/2015   CRP 2.0 (H) 02/16/2015   REPTSTATUS 08/20/2016 FINAL 08/15/2016   GRAMSTAIN  12/19/2014    NO WBC SEEN NO SQUAMOUS EPITHELIAL CELLS SEEN NO ORGANISMS SEEN Performed at [facility]    GRAMSTAIN  12/19/2014    NO WBC SEEN NO SQUAMOUS EPITHELIAL CELLS SEEN NO ORGANISMS SEEN Performed at [facility]    CULT NO GROWTH 5 DAYS 08/15/2016   LABORGA  STAPHYLOCOCCUS AUREUS 12/19/2014     Lab Results  Component Value Date   ALBUMIN 3.6 09/19/2019   ALBUMIN 3.8 08/25/2017   ALBUMIN 4.0 06/27/2017   PREALBUMIN 27.0 03/02/2015    Lab Results  Component Value Date   MG 2.1 07/09/2016   MG 1.8 03/02/2015   No results found for: VD25OH  Lab Results  Component Value Date   PREALBUMIN 27.0 03/02/2015   CBC EXTENDED Latest Ref Rng & Units 09/19/2019 08/25/2017 08/25/2017  WBC 4.0 - 10.5 K/uL 5.0 - 7.1  RBC 3.87 - 5.11 MIL/uL 4.82 - 4.68  HGB 12.0 - 15.0 g/dL 11.2(L) 12.2 10.9(L)  HCT 36.0 - 46.0 % 40.4 36.0 37.8  PLT 150 - 400 K/uL 386 - 410(H)  NEUTROABS 1.7 - 7.7 K/uL 2.8 - -  LYMPHSABS 0.7 - 4.0 K/uL 1.6 - -     There is no height or weight on file to calculate BMI.  Orders:  Orders Placed This Encounter  Procedures  . Ambulatory referral to Pain Clinic   Meds ordered this encounter  Medications  . ibuprofen (ADVIL) 800 MG tablet    Sig: Take 1 tablet (800 mg total) by mouth every 8 (eight) hours as needed.    Dispense:  30 tablet    Refill:  0  . ALPRAZolam (XANAX) 1 MG tablet    Sig: Take 1 tablet (1 mg total) by mouth 3 (three) times daily as needed for anxiety.    Dispense:  30 tablet    Refill:  0     Procedures: No procedures performed  Clinical Data: No additional findings.  ROS:  All other systems negative, except as noted in the HPI. Review of Systems  Objective: Vital Signs: There were no vitals taken for this visit.  Specialty Comments:  No specialty comments available.  PMFS History: Patient Active Problem List   Diagnosis Date Noted  . History of osteomyelitis   . Depression with anxiety   . Hypokalemia 07/08/2016  . Hyponatremia 07/08/2016  . 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 Polysubstance abuse (HCC) 03/15/2015  . Phantom pain (HCC) 03/15/2015  . Elevated lactic acid level 03/15/2015  . BKA stump complication (HCC)   . Fever   . Osteomyelitis (HCC)  03/02/2015  . Cellulitis 03/02/2015  . Cellulitis and abscess 12/17/2014  . Nicotine abuse 12/17/2014  . Anemia, chronic disease 12/17/2014  . Conjunctivitis, left eye   . Wound infection after surgery 09/29/2014  . Irritation of left eye 09/29/2014  . Wound, surgical, infected 09/29/2014  . Acute encephalopathy 08/29/2014  . Drug abuse (HCC) 08/29/2014  . Cocaine abuse (HCC) 08/29/2014  . Acute respiratory failure with hypoxia (HCC) 08/29/2014   Past Medical History:  Diagnosis Date  . Anemia   . Anxiety   . Arthritis   . Asthma    [facility]  . Bipolar disorder (HCC)   . Chronic back pain    "middle and lower" (12/17/2014)  . Depression   . GERD (gastroesophageal reflux disease)   . H/O blood clots   . Infection right ankle  . Migraine    "weekly" (12/17/2014)  . MRSA infection   . Nerve damage of right foot   . Osteomyelitis of ankle (HCC)    right    Family History  Problem Relation Age of Onset  . Lung cancer Mother   . Heart disease Mother        MI and lung cancer were cause of death  . Cancer Maternal Uncle   . Cancer Maternal Grandfather   . Cancer Paternal Grandfather     Past Surgical History:  Procedure Laterality Date  . AMPUTATION Right 03/04/2015   Procedure: Right Below Knee Amputation;  Surgeon: [name], MD;  Location: [facility] OR;  Service: Orthopedics;  Laterality: Right;  . ANKLE FRACTURE SURGERY Right 82016  . ANKLE FUSION Right 01/19/2015   Procedure: Right Tibiotalar Fusion, Place Antibiotic Beads;  Surgeon: [name], MD;  Location: [facility] OR;  Service: Orthopedics;  Laterality: Right;  . DILATION AND CURETTAGE OF UTERUS  X 2  . EXTERNAL FIXATION LEG Right 01/19/2015   Procedure: EXTERNAL FIXATION Right Ankle;  Surgeon: [name], MD;  Location: [facility] OR;  Service: Orthopedics;  Laterality: Right;  . EXTERNAL FIXATION REMOVAL Right 03/04/2015   Procedure: REMOVAL EXTERNAL FIXATION Right Lower Leg;  Surgeon: [name], MD;  Location: [facility] OR;   Service: Orthopedics;  Laterality: Right;  . FRACTURE SURGERY    . HARDWARE REMOVAL Right 09/02/2014   Procedure: Removal Right Lateral Ankle Hardware, Place Antibiotic Bead ;  Surgeon: [name], MD;  Location: [facility] ORS;  Service: Orthopedics;  Laterality: Right;  . INCISION AND DRAINAGE ABSCESS Right 12/19/2014   Procedure: INCISION AND DRAINAGE OF ABSCESS  ON RIGHT SIDE OF CHIN;  Surgeon: [name], MD;  Location: [facility] OR;  Service: ENT;  Laterality: Right;  . STUMP REVISION Right 04/14/2015   Procedure: RIGHT BELOW KNEE AMPUTATION REVISION;  Surgeon: [name], MD;  Location: [facility] OR;  Service: Orthopedics;  Laterality: Right;  . STUMP REVISION Right 07/11/2016   Procedure: Revision Right Below Knee Amputation, Fibula;  Surgeon: [name], MD;  Location: [facility] OR;  Service: Orthopedics;  Laterality: Right;  . TUBAL LIGATION     Social History   Occupational History  . Occupation: Disabled  Tobacco Use  . Smoking status: Current Every Day Smoker    Packs/day: 1.00    Years: 23.00    Pack years: 23.00    Types: Cigarettes  . Smokeless tobacco: Never Used  Vaping Use  . Vaping Use: Never used  Substance and Sexual Activity  . Alcohol use: Yes    Comment: Occasional use--celebrations  . Drug use: Yes    Types: Cocaine, Marijuana, Benzodiazepines    Comment: drug screen +  benzos,cocaine, marijuana pt denies using ( denies curent use 12/17/14) Denies 2020  . Sexual activity: Not Currently    Birth control/protection: Surgical

## 2020-05-03 ENCOUNTER — Encounter: Payer: Self-pay | Admitting: *Deleted

## 2020-05-03 ENCOUNTER — Telehealth: Payer: Self-pay | Admitting: *Deleted

## 2020-05-03 NOTE — Telephone Encounter (Signed)
Received fax from [facility] stating they have attempted to contact pt X2 and lvm to return call. Pt will need to contact [facility] if still wants referral.   I tried calling pt, phone was busy, I will send out letter to pt to contact.

## 2020-05-12 ENCOUNTER — Telehealth: Payer: Self-pay | Admitting: Orthopedic Surgery

## 2020-05-12 NOTE — Telephone Encounter (Signed)
Patient called advised she has an appointment at 9:00am in the morning with Pain Management and asked if she can get a copy of all medication that is listed in our system. Patient also asked if she can get a copy of her insurance card front and back? The number to contact patient is [phone]

## 2020-05-12 NOTE — Telephone Encounter (Signed)
I called pt and advised that information requested is at the front desk for pick up.

## 2020-05-12 NOTE — Telephone Encounter (Signed)
Patient called. She would like a refill on ibuprofen and xanax. Her call back number is [phone]

## 2020-05-13 ENCOUNTER — Other Ambulatory Visit: Payer: Self-pay | Admitting: Physician Assistant

## 2020-05-13 MED ORDER — IBUPROFEN 800 MG PO TABS: 800.0000 mg | ORAL_TABLET | Freq: Three times a day (TID) | ORAL | 0 refills | Status: AC | PRN

## 2020-05-13 MED ORDER — ALPRAZOLAM 1 MG PO TABS: 1.0000 mg | ORAL_TABLET | Freq: Three times a day (TID) | ORAL | 0 refills | Status: AC | PRN

## 2020-05-13 NOTE — Telephone Encounter (Signed)
Refilled both but any further refills on xanax will have to come from pcp
